# Patient Record
Sex: Male | Born: 1945
Health system: Southern US, Community
[De-identification: ages and names within clinical notes are randomized; demographics above are authoritative.]

## PROBLEM LIST (undated history)

## (undated) DIAGNOSIS — D126 Benign neoplasm of colon, unspecified: Secondary | ICD-10-CM

## (undated) DIAGNOSIS — R739 Hyperglycemia, unspecified: Secondary | ICD-10-CM

## (undated) DIAGNOSIS — H269 Unspecified cataract: Secondary | ICD-10-CM

## (undated) DIAGNOSIS — Z9989 Dependence on other enabling machines and devices: Secondary | ICD-10-CM

## (undated) DIAGNOSIS — C801 Malignant (primary) neoplasm, unspecified: Secondary | ICD-10-CM

## (undated) DIAGNOSIS — N400 Enlarged prostate without lower urinary tract symptoms: Secondary | ICD-10-CM

## (undated) DIAGNOSIS — G4730 Sleep apnea, unspecified: Secondary | ICD-10-CM

## (undated) DIAGNOSIS — G4733 Obstructive sleep apnea (adult) (pediatric): Secondary | ICD-10-CM

## (undated) DIAGNOSIS — E113499 Type 2 diabetes mellitus with severe nonproliferative diabetic retinopathy without macular edema, unspecified eye: Secondary | ICD-10-CM

## (undated) DIAGNOSIS — I10 Essential (primary) hypertension: Secondary | ICD-10-CM

## (undated) DIAGNOSIS — K5790 Diverticulosis of intestine, part unspecified, without perforation or abscess without bleeding: Secondary | ICD-10-CM

## (undated) DIAGNOSIS — E785 Hyperlipidemia, unspecified: Secondary | ICD-10-CM

## (undated) HISTORY — DX: Sleep apnea, unspecified: G47.30

## (undated) HISTORY — DX: Gilbert syndrome: E80.4

## (undated) HISTORY — DX: Benign neoplasm of colon, unspecified: D12.6

## (undated) HISTORY — PX: KNEE ARTHROSCOPY: SUR90

## (undated) HISTORY — PX: OTHER SURGICAL HISTORY: SHX169

## (undated) HISTORY — DX: Hyperlipidemia, unspecified: E78.5

## (undated) HISTORY — DX: Unspecified cataract: H26.9

## (undated) HISTORY — DX: Essential (primary) hypertension: I10

## (undated) HISTORY — DX: Dependence on other enabling machines and devices: Z99.89

## (undated) HISTORY — DX: Benign prostatic hyperplasia without lower urinary tract symptoms: N40.0

## (undated) HISTORY — DX: Malignant (primary) neoplasm, unspecified: C80.1

## (undated) HISTORY — PX: TONSILLECTOMY: SUR1361

## (undated) HISTORY — PX: COLONOSCOPY W/ POLYPECTOMY: SHX1380

## (undated) HISTORY — DX: Type 2 diabetes mellitus with severe nonproliferative diabetic retinopathy without macular edema, unspecified eye: E11.3499

## (undated) HISTORY — DX: Diverticulosis of intestine, part unspecified, without perforation or abscess without bleeding: K57.90

## (undated) HISTORY — DX: Obstructive sleep apnea (adult) (pediatric): G47.33

## (undated) HISTORY — DX: Hyperglycemia, unspecified: R73.9

## (undated) HISTORY — PX: WISDOM TOOTH EXTRACTION: SHX21

---

## 1998-09-06 ENCOUNTER — Encounter: Payer: Self-pay | Admitting: Internal Medicine

## 1998-09-06 ENCOUNTER — Ambulatory Visit (HOSPITAL_COMMUNITY): Admission: RE | Admit: 1998-09-06 | Discharge: 1998-09-06 | Payer: Self-pay | Admitting: Internal Medicine

## 2003-06-30 ENCOUNTER — Encounter: Payer: Self-pay | Admitting: Internal Medicine

## 2004-06-29 ENCOUNTER — Encounter (INDEPENDENT_AMBULATORY_CARE_PROVIDER_SITE_OTHER): Payer: Self-pay | Admitting: *Deleted

## 2004-06-29 ENCOUNTER — Ambulatory Visit: Payer: Self-pay | Admitting: Internal Medicine

## 2004-07-10 ENCOUNTER — Ambulatory Visit: Payer: Self-pay | Admitting: Pulmonary Disease

## 2004-07-10 ENCOUNTER — Ambulatory Visit (HOSPITAL_BASED_OUTPATIENT_CLINIC_OR_DEPARTMENT_OTHER): Admission: RE | Admit: 2004-07-10 | Discharge: 2004-07-10 | Payer: Self-pay | Admitting: Internal Medicine

## 2004-12-21 ENCOUNTER — Ambulatory Visit: Payer: Self-pay | Admitting: Internal Medicine

## 2005-06-22 ENCOUNTER — Ambulatory Visit: Payer: Self-pay | Admitting: Internal Medicine

## 2005-06-29 ENCOUNTER — Ambulatory Visit: Payer: Self-pay | Admitting: Internal Medicine

## 2005-12-31 ENCOUNTER — Ambulatory Visit: Payer: Self-pay | Admitting: Internal Medicine

## 2006-05-13 ENCOUNTER — Ambulatory Visit: Payer: Self-pay | Admitting: Internal Medicine

## 2006-05-13 LAB — CONVERTED CEMR LAB
ALT: 26 units/L (ref 0–40)
AST: 24 units/L (ref 0–37)
Chol/HDL Ratio, serum: 4
Cholesterol: 165 mg/dL (ref 0–200)
HDL: 41 mg/dL (ref >39.0)
Hgb A1c MFr Bld: 5.3 % (ref 4.6–6.0)
LDL Cholesterol: 88 mg/dL (ref 0–99)
Triglyceride fasting, serum: 179 mg/dL — ABNORMAL HIGH (ref 0–149)
VLDL: 36 mg/dL (ref 0–40)

## 2006-09-17 ENCOUNTER — Ambulatory Visit: Payer: Self-pay | Admitting: Internal Medicine

## 2006-09-17 ENCOUNTER — Encounter (INDEPENDENT_AMBULATORY_CARE_PROVIDER_SITE_OTHER): Payer: Self-pay | Admitting: *Deleted

## 2006-09-17 LAB — CONVERTED CEMR LAB
ALT: 27 units/L (ref 0–40)
AST: 21 units/L (ref 0–37)
Albumin: 4 g/dL (ref 3.5–5.2)
Alkaline Phosphatase: 29 units/L — ABNORMAL LOW (ref 39–117)
BUN: 13 mg/dL (ref 6–23)
Basophils Absolute: 0 10*3/uL (ref 0.0–0.1)
Basophils Relative: 0.3 % (ref 0.0–1.0)
Bilirubin, Direct: 0.1 mg/dL (ref 0.0–0.3)
CO2: 30 meq/L (ref 19–32)
Calcium: 9.5 mg/dL (ref 8.4–10.5)
Chloride: 106 meq/L (ref 96–112)
Cholesterol: 142 mg/dL (ref 0–200)
Creatinine, Ser: 0.9 mg/dL (ref 0.4–1.5)
Eosinophils Absolute: 0.1 10*3/uL (ref 0.0–0.6)
Eosinophils Relative: 2 % (ref 0.0–5.0)
GFR calc Af Amer: 111 mL/min
GFR calc non Af Amer: 92 mL/min
Glucose, Bld: 101 mg/dL — ABNORMAL HIGH (ref 70–99)
HCT: 45.4 % (ref 39.0–52.0)
HDL: 35 mg/dL — ABNORMAL LOW (ref >39.0)
Hemoglobin: 16 g/dL (ref 13.0–17.0)
Hgb A1c MFr Bld: 5.4 % (ref 4.6–6.0)
LDL Cholesterol: 79 mg/dL (ref 0–99)
Lymphocytes Relative: 24.6 % (ref 12.0–46.0)
MCHC: 35.3 g/dL (ref 30.0–36.0)
MCV: 95.3 fL (ref 78.0–100.0)
Monocytes Absolute: 0.5 10*3/uL (ref 0.2–0.7)
Monocytes Relative: 10.2 % (ref 3.0–11.0)
Neutro Abs: 3.2 10*3/uL (ref 1.4–7.7)
Neutrophils Relative %: 62.9 % (ref 43.0–77.0)
PSA: 0.8 ng/mL
PSA: 0.8 ng/mL (ref 0.10–4.00)
Platelets: 199 10*3/uL (ref 150–400)
Potassium: 4.2 meq/L (ref 3.5–5.1)
RBC: 4.76 M/uL (ref 4.22–5.81)
RDW: 11.4 % — ABNORMAL LOW (ref 11.5–14.6)
Sodium: 142 meq/L (ref 135–145)
TSH: 2.42 microintl units/mL (ref 0.35–5.50)
Total Bilirubin: 1.3 mg/dL — ABNORMAL HIGH (ref 0.3–1.2)
Total CHOL/HDL Ratio: 4.1
Total Protein: 7.2 g/dL (ref 6.0–8.3)
Triglycerides: 142 mg/dL (ref 0–149)
VLDL: 28 mg/dL (ref 0–40)
WBC: 5.1 10*3/uL (ref 4.5–10.5)

## 2006-09-26 ENCOUNTER — Ambulatory Visit: Payer: Self-pay | Admitting: Internal Medicine

## 2007-05-26 ENCOUNTER — Encounter (INDEPENDENT_AMBULATORY_CARE_PROVIDER_SITE_OTHER): Payer: Self-pay | Admitting: *Deleted

## 2007-05-26 DIAGNOSIS — Z85828 Personal history of other malignant neoplasm of skin: Secondary | ICD-10-CM | POA: Insufficient documentation

## 2007-05-26 DIAGNOSIS — K5730 Diverticulosis of large intestine without perforation or abscess without bleeding: Secondary | ICD-10-CM | POA: Insufficient documentation

## 2007-05-26 DIAGNOSIS — N400 Enlarged prostate without lower urinary tract symptoms: Secondary | ICD-10-CM | POA: Insufficient documentation

## 2007-05-27 ENCOUNTER — Telehealth (INDEPENDENT_AMBULATORY_CARE_PROVIDER_SITE_OTHER): Payer: Self-pay | Admitting: *Deleted

## 2007-08-28 ENCOUNTER — Encounter: Payer: Self-pay | Admitting: Internal Medicine

## 2007-10-31 ENCOUNTER — Telehealth: Payer: Self-pay | Admitting: Internal Medicine

## 2007-10-31 ENCOUNTER — Ambulatory Visit: Payer: Self-pay | Admitting: Internal Medicine

## 2007-10-31 ENCOUNTER — Encounter: Payer: Self-pay | Admitting: Internal Medicine

## 2007-10-31 IMAGING — CR DG CHEST 2V
2 series · 2 of 2 positions shown · non-contrast
Comparison: Chest radiograph [DATE]

CLINICAL DATA: Acute bronchitis cough times 1 week

CHEST - 2 VIEW

[view not recorded (1 of 2)]
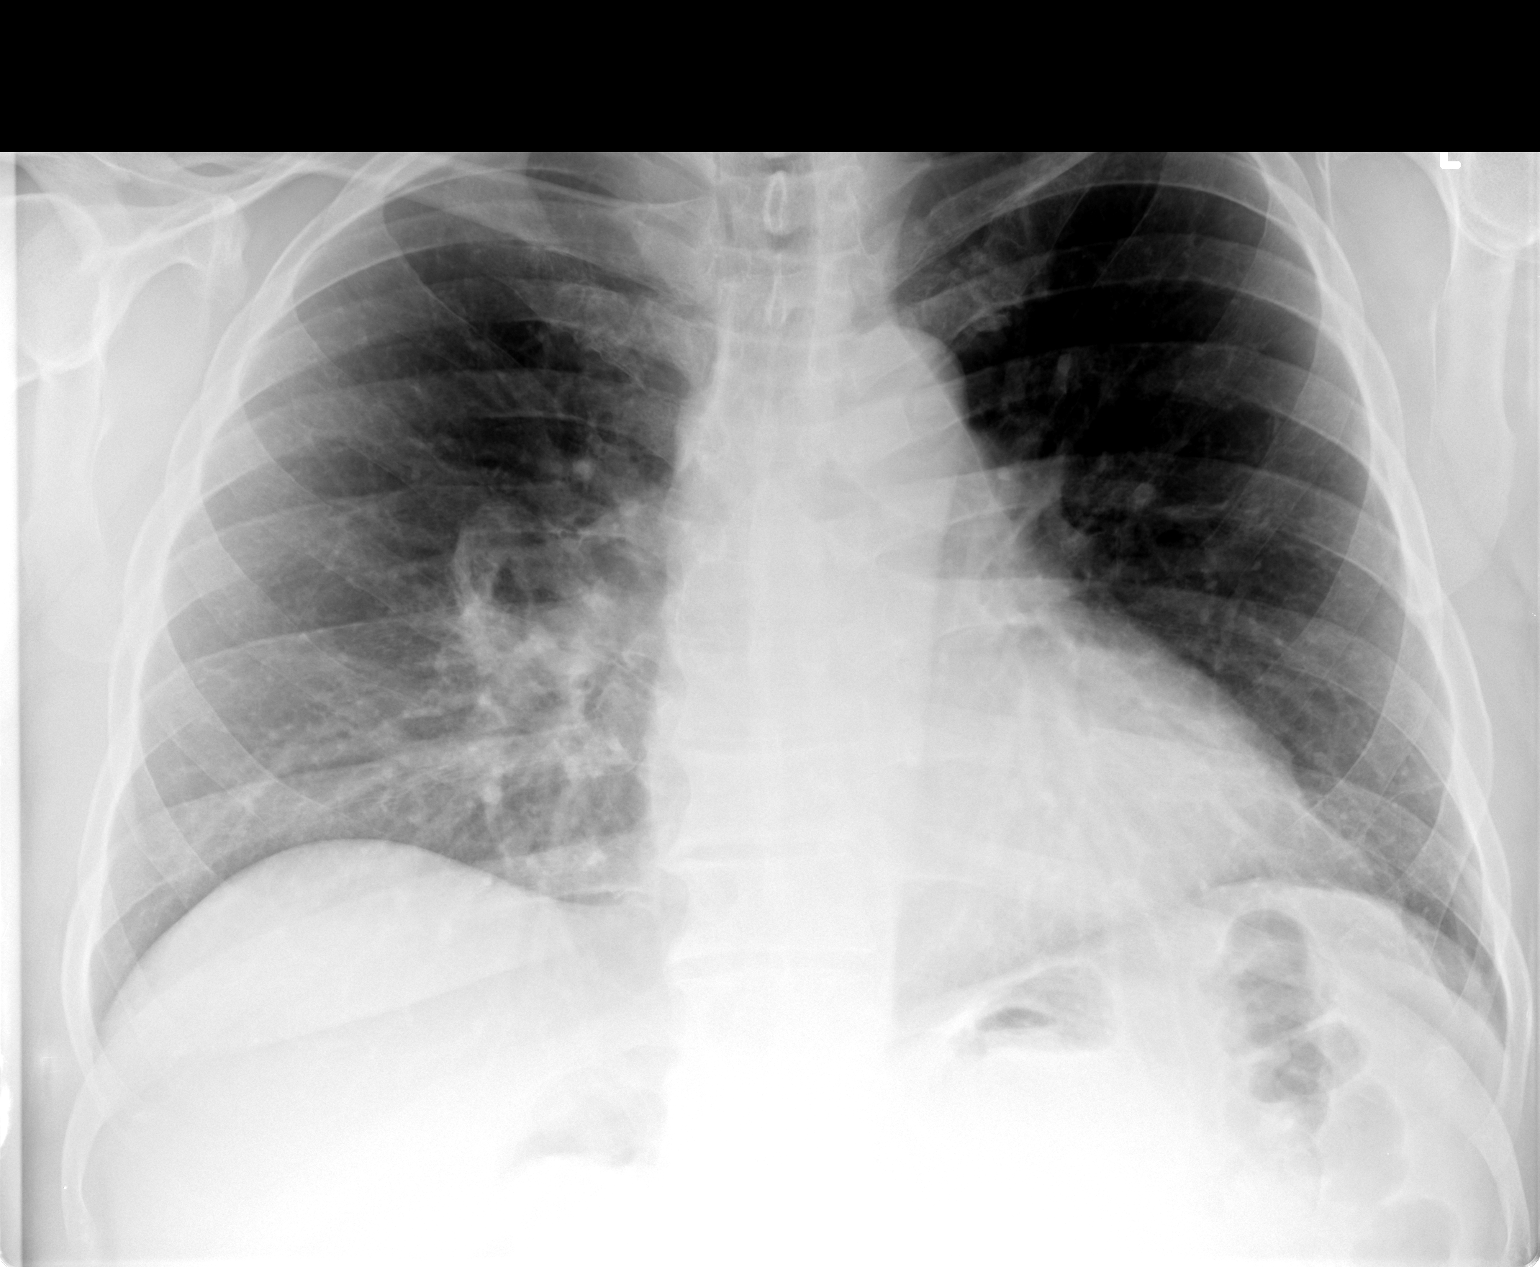

[view not recorded (2 of 2)]
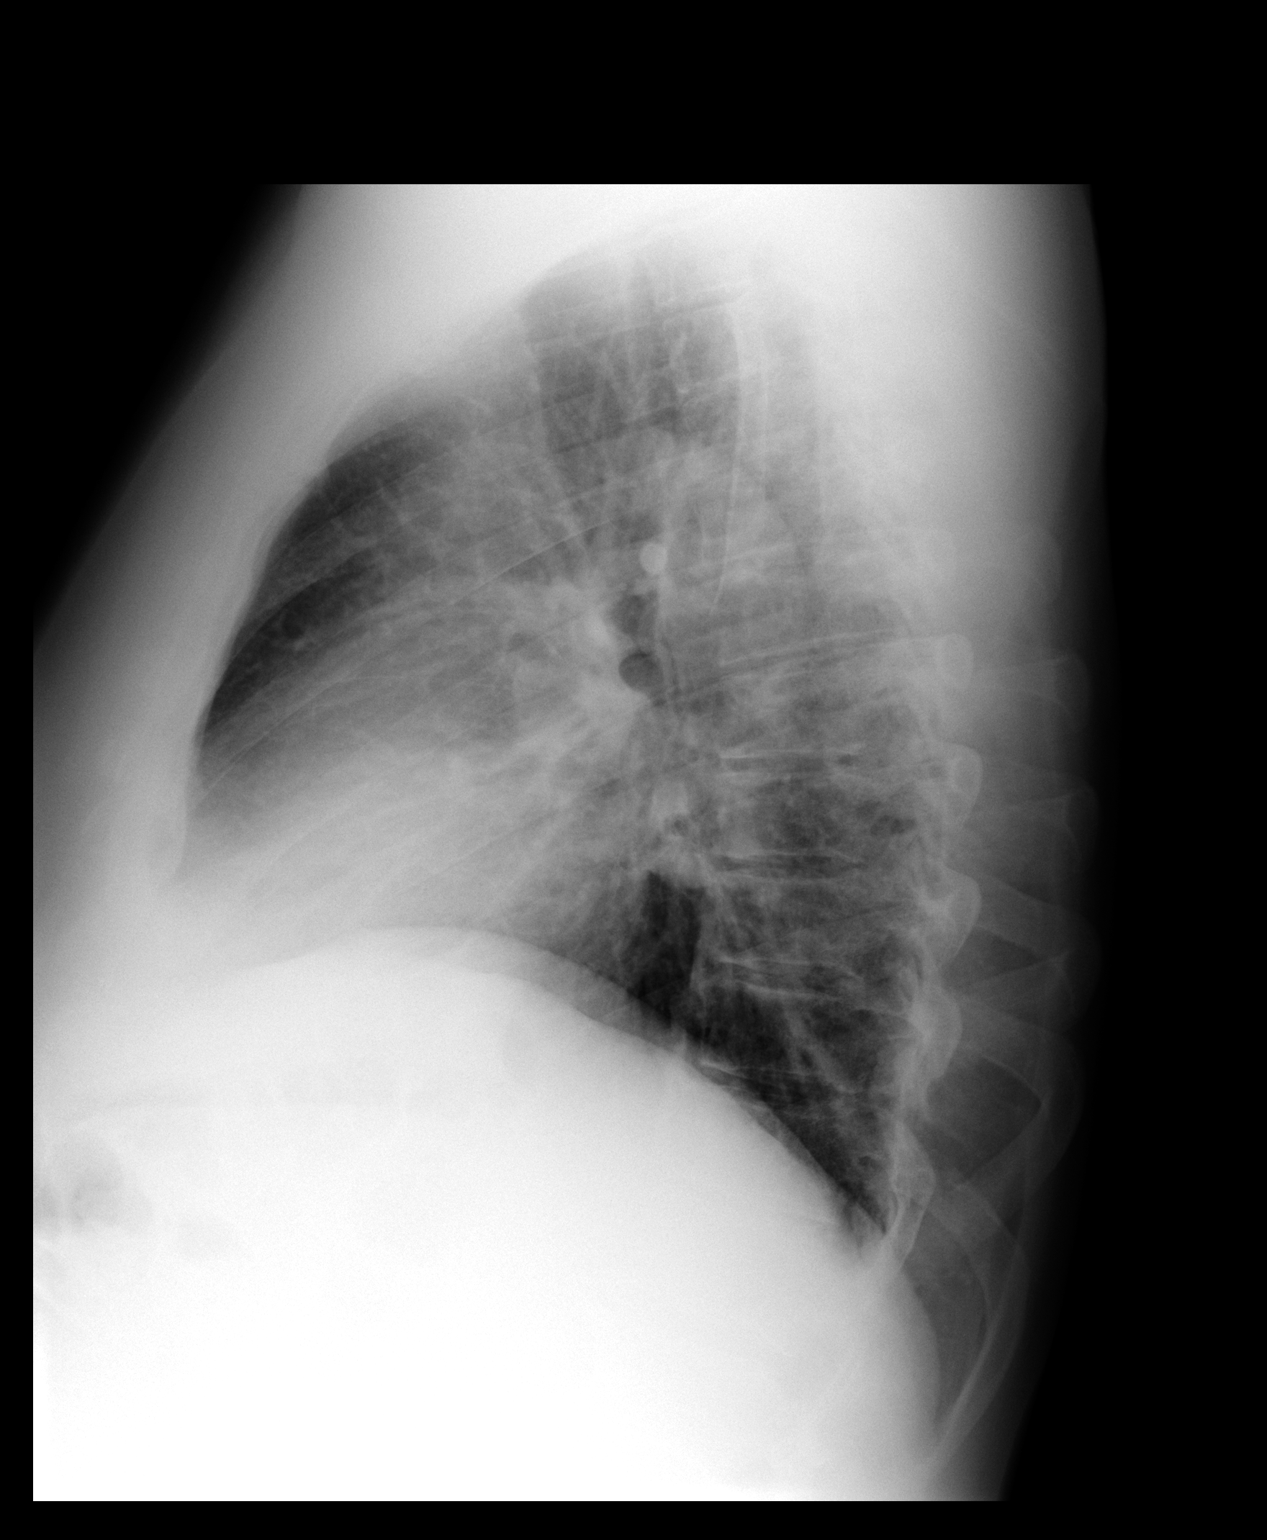

[2 of 2 positions shown; findings below may reference images not displayed]

FINDINGS: Stable mildly enlarged cardiac silhouette.  There is
coarse central bronchitic change similar to prior.   No pulmonary
edema.  No pneumothorax.  Opacity in the right cardiophrenic angle
could represent atelectasis versus early infiltrate.
IMPRESSION: 1. Opacity in right cardiophrenic angle represent atelectasis
versus early infiltrate.
2..  Chronic bronchitic change.

## 2007-12-15 ENCOUNTER — Ambulatory Visit: Payer: Self-pay | Admitting: Internal Medicine

## 2007-12-23 ENCOUNTER — Ambulatory Visit: Payer: Self-pay | Admitting: Internal Medicine

## 2008-01-19 ENCOUNTER — Encounter: Payer: Self-pay | Admitting: Internal Medicine

## 2008-01-19 DIAGNOSIS — D126 Benign neoplasm of colon, unspecified: Secondary | ICD-10-CM

## 2008-01-19 HISTORY — DX: Benign neoplasm of colon, unspecified: D12.6

## 2008-01-25 ENCOUNTER — Encounter: Payer: Self-pay | Admitting: Internal Medicine

## 2008-02-19 ENCOUNTER — Telehealth (INDEPENDENT_AMBULATORY_CARE_PROVIDER_SITE_OTHER): Payer: Self-pay | Admitting: *Deleted

## 2008-02-26 ENCOUNTER — Ambulatory Visit: Payer: Self-pay | Admitting: Internal Medicine

## 2008-02-26 LAB — CONVERTED CEMR LAB
Albumin: 4 g/dL (ref 3.5–5.2)
Alkaline Phosphatase: 32 units/L — ABNORMAL LOW (ref 39–117)
Basophils Absolute: 0 10*3/uL (ref 0.0–0.1)
Bilirubin, Direct: 0.1 mg/dL (ref 0.0–0.3)
Blood in Urine, dipstick: NEGATIVE
Chloride: 107 meq/L (ref 96–112)
Eosinophils Absolute: 0.1 10*3/uL (ref 0.0–0.7)
GFR calc Af Amer: 111 mL/min
GFR calc non Af Amer: 91 mL/min
Glucose, Urine, Semiquant: NEGATIVE
HCT: 44 % (ref 39.0–52.0)
HDL: 36.7 mg/dL — ABNORMAL LOW (ref >39.0)
Ketones, urine, test strip: NEGATIVE
MCV: 97.3 fL (ref 78.0–100.0)
Monocytes Absolute: 0.7 10*3/uL (ref 0.1–1.0)
Neutrophils Relative %: 57.6 % (ref 43.0–77.0)
Nitrite: NEGATIVE
Platelets: 184 10*3/uL (ref 150–400)
Potassium: 3.8 meq/L (ref 3.5–5.1)
Protein, U semiquant: NEGATIVE
RDW: 11.8 % (ref 11.5–14.6)
Sodium: 142 meq/L (ref 135–145)
Specific Gravity, Urine: 1.01
TSH: 3.07 microintl units/mL (ref 0.35–5.50)
Total Bilirubin: 1.2 mg/dL (ref 0.3–1.2)
Triglycerides: 158 mg/dL — ABNORMAL HIGH (ref 0–149)
VLDL: 32 mg/dL (ref 0–40)
WBC Urine, dipstick: NEGATIVE
pH: 5

## 2008-03-02 ENCOUNTER — Encounter: Payer: Self-pay | Admitting: Internal Medicine

## 2008-03-03 ENCOUNTER — Ambulatory Visit: Payer: Self-pay | Admitting: Internal Medicine

## 2008-03-03 DIAGNOSIS — I10 Essential (primary) hypertension: Secondary | ICD-10-CM

## 2008-03-03 DIAGNOSIS — E785 Hyperlipidemia, unspecified: Secondary | ICD-10-CM

## 2008-03-03 DIAGNOSIS — Z86010 Personal history of colonic polyps: Secondary | ICD-10-CM

## 2008-03-03 DIAGNOSIS — N400 Enlarged prostate without lower urinary tract symptoms: Secondary | ICD-10-CM

## 2008-03-03 DIAGNOSIS — Z85828 Personal history of other malignant neoplasm of skin: Secondary | ICD-10-CM

## 2008-03-03 DIAGNOSIS — M7580 Other shoulder lesions, unspecified shoulder: Secondary | ICD-10-CM

## 2008-06-11 ENCOUNTER — Telehealth (INDEPENDENT_AMBULATORY_CARE_PROVIDER_SITE_OTHER): Payer: Self-pay | Admitting: *Deleted

## 2009-03-16 ENCOUNTER — Encounter (INDEPENDENT_AMBULATORY_CARE_PROVIDER_SITE_OTHER): Payer: Self-pay | Admitting: *Deleted

## 2009-03-23 ENCOUNTER — Ambulatory Visit: Payer: Self-pay | Admitting: Internal Medicine

## 2009-03-23 DIAGNOSIS — J309 Allergic rhinitis, unspecified: Secondary | ICD-10-CM | POA: Insufficient documentation

## 2009-11-24 ENCOUNTER — Telehealth (INDEPENDENT_AMBULATORY_CARE_PROVIDER_SITE_OTHER): Payer: Self-pay | Admitting: *Deleted

## 2010-05-11 ENCOUNTER — Ambulatory Visit
Admission: RE | Admit: 2010-05-11 | Discharge: 2010-05-11 | Payer: Self-pay | Source: Home / Self Care | Attending: Internal Medicine | Admitting: Internal Medicine

## 2010-05-11 ENCOUNTER — Encounter: Payer: Self-pay | Admitting: Internal Medicine

## 2010-05-11 ENCOUNTER — Other Ambulatory Visit: Payer: Self-pay | Admitting: Internal Medicine

## 2010-05-11 DIAGNOSIS — M545 Low back pain: Secondary | ICD-10-CM | POA: Insufficient documentation

## 2010-05-11 DIAGNOSIS — M542 Cervicalgia: Secondary | ICD-10-CM | POA: Insufficient documentation

## 2010-05-11 DIAGNOSIS — R739 Hyperglycemia, unspecified: Secondary | ICD-10-CM | POA: Insufficient documentation

## 2010-05-11 LAB — CBC WITH DIFFERENTIAL/PLATELET
Basophils Absolute: 0 10*3/uL (ref 0.0–0.1)
Basophils Relative: 0.8 % (ref 0.0–3.0)
Eosinophils Absolute: 0.1 10*3/uL (ref 0.0–0.7)
Eosinophils Relative: 2.8 % (ref 0.0–5.0)
HCT: 44.3 % (ref 39.0–52.0)
Hemoglobin: 15.6 g/dL (ref 13.0–17.0)
Lymphocytes Relative: 27.6 % (ref 12.0–46.0)
Lymphs Abs: 1.4 10*3/uL (ref 0.7–4.0)
MCHC: 35.1 g/dL (ref 30.0–36.0)
MCV: 97.5 fl (ref 78.0–100.0)
Monocytes Absolute: 0.5 10*3/uL (ref 0.1–1.0)
Monocytes Relative: 10.6 % (ref 3.0–12.0)
Neutro Abs: 3 10*3/uL (ref 1.4–7.7)
Neutrophils Relative %: 58.2 % (ref 43.0–77.0)
Platelets: 182 10*3/uL (ref 150.0–400.0)
RBC: 4.55 Mil/uL (ref 4.22–5.81)
RDW: 12.6 % (ref 11.5–14.6)
WBC: 5.1 10*3/uL (ref 4.5–10.5)

## 2010-05-11 LAB — LIPID PANEL
Cholesterol: 157 mg/dL (ref 0–200)
HDL: 40.3 mg/dL (ref >39.00)
LDL Cholesterol: 84 mg/dL (ref 0–99)
Total CHOL/HDL Ratio: 4
Triglycerides: 162 mg/dL — ABNORMAL HIGH (ref 0.0–149.0)
VLDL: 32.4 mg/dL (ref 0.0–40.0)

## 2010-05-11 LAB — BASIC METABOLIC PANEL
BUN: 19 mg/dL (ref 6–23)
CO2: 25 mEq/L (ref 19–32)
Calcium: 9.1 mg/dL (ref 8.4–10.5)
Chloride: 106 mEq/L (ref 96–112)
Creatinine, Ser: 1.1 mg/dL (ref 0.4–1.5)
GFR: 69.09 mL/min (ref >60.00)
Glucose, Bld: 100 mg/dL — ABNORMAL HIGH (ref 70–99)
Potassium: 4.2 mEq/L (ref 3.5–5.1)
Sodium: 141 mEq/L (ref 135–145)

## 2010-05-11 LAB — HEPATIC FUNCTION PANEL
ALT: 25 U/L (ref 0–53)
AST: 23 U/L (ref 0–37)
Albumin: 4 g/dL (ref 3.5–5.2)
Alkaline Phosphatase: 34 U/L — ABNORMAL LOW (ref 39–117)
Bilirubin, Direct: 0.1 mg/dL (ref 0.0–0.3)
Total Bilirubin: 1 mg/dL (ref 0.3–1.2)
Total Protein: 7 g/dL (ref 6.0–8.3)

## 2010-05-11 LAB — TSH: TSH: 1.81 u[IU]/mL (ref 0.35–5.50)

## 2010-05-11 LAB — PSA: PSA: 0.88 ng/mL (ref 0.10–4.00)

## 2010-05-11 LAB — HEMOGLOBIN A1C: Hgb A1c MFr Bld: 5.5 % (ref 4.6–6.5)

## 2010-06-04 LAB — CONVERTED CEMR LAB
BUN: 11 mg/dL (ref 6–23)
Basophils Absolute: 0 10*3/uL (ref 0.0–0.1)
Cholesterol: 120 mg/dL (ref 0–200)
Creatinine, Ser: 1 mg/dL (ref 0.4–1.5)
GFR calc non Af Amer: 80.67 mL/min (ref >60)
Glucose, Bld: 105 mg/dL — ABNORMAL HIGH (ref 70–99)
HCT: 44.7 % (ref 39.0–52.0)
Lymphs Abs: 1.2 10*3/uL (ref 0.7–4.0)
MCV: 100.7 fL — ABNORMAL HIGH (ref 78.0–100.0)
Monocytes Absolute: 0.6 10*3/uL (ref 0.1–1.0)
Monocytes Relative: 11.9 % (ref 3.0–12.0)
PSA: 0.84 ng/mL (ref 0.10–4.00)
Platelets: 222 10*3/uL (ref 150.0–400.0)
Potassium: 4 meq/L (ref 3.5–5.1)
RDW: 11.8 % (ref 11.5–14.6)
TSH: 1.74 microintl units/mL (ref 0.35–5.50)
Total Bilirubin: 0.7 mg/dL (ref 0.3–1.2)
Triglycerides: 125 mg/dL (ref 0.0–149.0)
VLDL: 25 mg/dL (ref 0.0–40.0)

## 2010-06-06 NOTE — Progress Notes (Signed)
Summary: crestor refill   Phone Note Refill Request Call back at [phone] Message from:  Pharmacy on November 24, 2009 10:00 AM  Refills Requested: Medication #1:  CRESTOR 20 MG  TABS take 1 tablet by mouth every other day   Dosage confirmed as above?Dosage Confirmed   Supply Requested: 1 month   Last Refilled: 08/01/2009   Dosage confirmed as above?Dosage Confirmed [facility]   Initial call taken by: Lavell Islam,  November 24, 2009 10:01 AM    Prescriptions: CRESTOR 20 MG  TABS (ROSUVASTATIN CALCIUM) take 1 tablet by mouth every other day  #30 Each x 5   Entered by:   Doristine Devoid CMA   Authorized by:   Marga Melnick MD   Signed by:   Doristine Devoid CMA on 11/24/2009   Method used:   Electronically to        [facility]* (retail)       [address]       [place], [place]  [id number]       Ph: [phone]       Fax: [phone]   RxID:   [id number]

## 2010-06-08 NOTE — Assessment & Plan Note (Signed)
Summary: cpx & lab/cbs   Vital Signs:  Patient profile:   65 year old male Height:      68.25 inches Weight:      230.8 pounds BMI:     34.96 Temp:     98.6 degrees F oral Pulse rate:   72 / minute Resp:     14 per minute BP sitting:   126 / 84  (left arm) Cuff size:   large  Vitals Entered By: Shonna Chock CMA (May 11, 2010 8:31 AM) CC: CPX with fasting labs , Back pain, Neck pain, Lipid Management   CC:  CPX with fasting labs , Back pain, Neck pain, and Lipid Management.  History of Present Illness:    Edwin [name] is here for a physical; he has had some new symptoms as delineated below.  He describes Back Pain intermittently X 6 months w/o trigger or injury.  The pain is located in the mid low back.  The  aching pain began gradually.  The pain  does not radiate.  The pain  has  no  exacerbating factors, even tennis.The pain is made  slightly better by NSAID medications( Aleve or Advil). He is pain free today.        The patient also presents with daily  Neck pain.  The patient reports  diffuse occipital &  neck pain when he turns his head to back out of driveway.Associated symptoms include slight  impaired neck ROM.  The patient denies the following associated symptoms: numbness, weakness, tingling/parasthesias, bladder dysfunction, bowel dysfunction, locking, and clicking.  The pain is described as dull and intermittent, only with lateral neck rotation in either direction.Pain essentially resolves with straightening head.                                                                                                                           The patient also presents for Hyperlipidemia follow-up.  The patient denies muscle aches, GI upset, abdominal pain, flushing, itching, constipation, diarrhea, and fatigue.  The patient denies the following symptoms: chest pain/pressure, exercise intolerance, dypsnea, palpitations, syncope, and pedal edema.  Compliance with medications (by patient  report) has been near 100%.  Dietary compliance has been good.  The patient reports exercising occasionally.  Adjunctive measures currently used by the patient include fiber, ASA, and fish oil supplements.        The patient also presents for Hypertension follow-up.  The patient reports urinary frequency, but denies lightheadedness and headaches.  Compliance with medications (by patient report) has been near 100%.  Adjunctive measures currently used by the patient include salt restriction. BP not monitored.   Lipid Management History:      Positive NCEP/ATP III risk factors include male age 37 years old or older, HDL cholesterol less than 40, family history for ischemic heart disease (males less than 36 years old), and hypertension.  Negative NCEP/ATP III risk factors include non-diabetic, non-tobacco-user status, no ASHD (atherosclerotic heart  disease), no prior stroke/TIA, no peripheral vascular disease, and no history of aortic aneurysm.     Current Medications (verified): 1)  Baby Aspirin 81 Mg  Chew (Aspirin) .... Take 1 Tablet By Mouth Daily As Directed 2)  Multivitamins   Tabs (Multiple Vitamin) .... Take 1 Tablet By Mouth Daily 3)  Saw Palmetto .... As Directed 4)  Omega 3 .... As Directed 5)  Metoprolol Succinate 50 Mg  Tb24 (Metoprolol Succinate) .... Take 1 Qd 6)  Crestor 20 Mg  Tabs (Rosuvastatin Calcium) .... Take 1 Tablet By Mouth Every Other Day 7)  Vitamin D 1000 Unit Tabs (Cholecalciferol) .[name].. 1 By Mouth Once Daily  Allergies (verified): No Known Drug Allergies  Past History:  Past Medical History: Diverticulosis, colon; Hyperglycemia (790.29); Gilbert's Syndrome; Skin cancer, PMH  of, Basal Cell of face, Dr  Karlyn Agee Colonic polyps, PMH  of 2009 Hyperlipidemia: NMR Lipoprofile 2005: LDL 146(2007/1330),HDL 38, TG 169. LDL goal = < 100; Framingham Study LDL goal = < 130 Benign prostatic hypertrophy Sleep Apnea on CPAP  Past Surgical  History: Tonsillectomy Arthroscopy  left knee, Dr  Hayden Rasmussen Colonoscopy negative 2004 Colon polypectomy, Tics  2009 Kathryne Sharper)  Family History: Father: DM (type II), HTN, CBAG  @ 65 ,died post fall (SDH) Mother: Diverticulosis, ? colon polyps, CVA (2005), Parkinson's Siblings: sister: HTN,  ruptured appendix,                brother: died age 46  lymphoma, ruptured appendix, asthma as child MGF:  MI early 108's, lived to 61-90, Parkinson's MGM:  CVA (18), ? MI in 44s  Social History: Occupation: Gaffer Married Never Smoked Alcohol use-yes: socially [employer]   Review of Systems  The patient denies anorexia, fever, vision loss, decreased hearing, hoarseness, prolonged cough, hemoptysis, melena, hematochezia, hematuria, suspicious skin lesions, depression, unusual weight change, abnormal bleeding, enlarged lymph nodes, and angioedema.    Physical Exam  General:  well-nourished;alert,appropriate and cooperative throughout examination Head:  Normocephalic and atraumatic without obvious abnormalities. No apparent alopecia Eyes:  No corneal or conjunctival inflammation noted. Perrla. Funduscopic exam benign, without hemorrhages, exudates or papilledema. Ears:  External ear exam shows no significant lesions or deformities.  Otoscopic examination reveals  some wax , R > L. Hearing is grossly normal bilaterally. Nose:  External nasal examination shows no deformity or inflammation. Nasal mucosa are pink and moist without lesions or exudates. Mouth:  Oral mucosa and oropharynx without lesions or exudates.  Teeth in good repair. Neck:  No deformities, masses, or tenderness noted. Some ROM limitations  Lungs:  Normal respiratory effort, chest expands symmetrically. Lungs are clear to auscultation, no crackles or wheezes. Heart:  Normal rate and regular rhythm. S1 and S2 normal without gallop, murmur, click, rub or other extra sounds. Abdomen:  Bowel sounds  positive,abdomen soft and non-tender without masses, organomegaly or hernias noted. Rectal:  No external abnormalities noted. Normal sphincter tone. No rectal masses or tenderness. Genitalia:  Testes bilaterally descended without nodularity, tenderness or masses. No scrotal masses or lesions. No penis lesions or urethral discharge. L varicocele.   Prostate:  Prostate gland firm and smooth,  ULN enlargement w/o nodularity, tenderness, mass, asymmetry or induration. Msk:  No deformity or scoliosis noted of thoracic or lumbar spine.   Pulses:  R and L carotid,radial,dorsalis pedis and posterior tibial pulses are full and equal bilaterally Extremities:  No clubbing, cyanosis, edema, or deformity noted with normal full range of motion of all joints.  Neurologic:  alert & oriented X3, strength normal in all extremities, gait normal, and DTRs symmetrical and normal.   Skin:  Large keratosis R forehead Cervical Nodes:  No lymphadenopathy noted Axillary Nodes:  No palpable lymphadenopathy Psych:  memory intact for recent and remote, normally interactive, and good eye contact.     Impression & Recommendations:  Problem # 1:  ROUTINE GENERAL MEDICAL EXAM@HEALTH  CARE FACL (ICD-V70.0)  Orders: EKG w/ Interpretation (93000) Venipuncture (56213) TLB-Lipid Panel (80061-LIPID) TLB-BMP (Basic Metabolic Panel-BMET) (80048-METABOL) TLB-CBC Platelet - w/Differential (85025-CBCD) TLB-Hepatic/Liver Function Pnl (80076-HEPATIC) TLB-TSH (Thyroid Stimulating Hormone) (84443-TSH) TLB-PSA (Prostate Specific Antigen) (84153-PSA) TLB-A1C / Hgb A1C (Glycohemoglobin) (83036-A1C)  Problem # 2:  LOW BACK PAIN SYNDROME (ICD-724.2) exercises (stretching) His updated medication list for this problem includes:    Baby Aspirin 81 Mg Chew (Aspirin) .[name]... Take 1 tablet by mouth daily as directed  Problem # 3:  NECK PAIN (ICD-723.1)  His updated medication list for this problem includes:    Baby Aspirin 81 Mg Chew  (Aspirin) .[name]... Take 1 tablet by mouth daily as directed  Problem # 4:  HYPERTENSION, ESSENTIAL NOS (ICD-401.9) controlled His updated medication list for this problem includes:    Metoprolol Succinate 50 Mg Tb24 (Metoprolol succinate) .[name]... Take 1 qd  Problem # 5:  HYPERLIPIDEMIA (ICD-272.4)  His updated medication list for this problem includes:    Crestor 20 Mg Tabs (Rosuvastatin calcium) .[name]... Take 1/2  tablet by mouth every other day  Problem # 6:  DIABETES MELLITUS, FAMILY HX (ICD-V18.0)  Problem # 7:  HYPERGLYCEMIA, FASTING (ICD-790.29)  Problem # 8:  BENIGN PROSTATIC HYPERTROPHY (ICD-600.00)  Complete Medication List: 1)  Baby Aspirin 81 Mg Chew (Aspirin) .... Take 1 tablet by mouth daily as directed 2)  Multivitamins Tabs (Multiple vitamin) .... Take 1 tablet by mouth daily 3)  Saw Palmetto  .... As directed 4)  Omega 3  .... As directed 5)  Metoprolol Succinate 50 Mg Tb24 (Metoprolol succinate) .... Take 1 qd 6)  Crestor 20 Mg Tabs (Rosuvastatin calcium) .... Take 1 tablet by mouth every other day 7)  Vitamin D 1000 Unit Tabs (Cholecalciferol) .[name].. 1 by mouth once daily  Other Orders: Tdap => 46yrs IM (08657) Admin 1st Vaccine (84696)  Lipid Assessment/Plan:      Based on NCEP/ATP III, the patient's risk factor category is "2 or more risk factors and a calculated 10 year CAD risk of < 20%".  The patient's lipid goals are as follows: Total cholesterol goal is 200; LDL cholesterol goal is 130; HDL cholesterol goal is 40; Triglyceride goal is 150.    Patient Instructions: 1)  It is important that you exercise regularly at least 20 minutes 5 times a week. If you develop chest pain, have severe difficulty breathing, or feel very tired , stop exercising immediately and seek medical attention.Take an Aspirin every day. Physical Therapy or Chiropractry if neck symptoms progress. 2)  Check your Blood Pressure regularly. If it is above: 135/85 ON AVERAGE  you should make an  appointment.   Orders Added: 1)  Tdap => 5yrs IM [90715] 2)  Admin 1st Vaccine [90471] 3)  Est. Patient 40-64 years [99396] 4)  EKG w/ Interpretation [93000] 5)  Venipuncture [36415] 6)  TLB-Lipid Panel [80061-LIPID] 7)  TLB-BMP (Basic Metabolic Panel-BMET) [80048-METABOL] 8)  TLB-CBC Platelet - w/Differential [85025-CBCD] 9)  TLB-Hepatic/Liver Function Pnl [80076-HEPATIC] 10)  TLB-TSH (Thyroid Stimulating Hormone) [84443-TSH] 11)  TLB-PSA (Prostate Specific Antigen) [84153-PSA] 12)  TLB-A1C / Hgb  A1C (Glycohemoglobin) [83036-A1C]   Immunizations Administered:  Tetanus Vaccine:    Vaccine Type: Tdap    Site: right deltoid    Mfr: GlaxoSmithKline    Dose: 0.5 ml    Route: IM    Given by: Shonna Chock CMA    Exp. Date: 02/24/2012    Lot #: [id number]    VIS given: 03/24/08 version given May 11, 2010.   Immunizations Administered:  Tetanus Vaccine:    Vaccine Type: Tdap    Site: right deltoid    Mfr: GlaxoSmithKline    Dose: 0.5 ml    Route: IM    Given by: Shonna Chock CMA    Exp. Date: 02/24/2012    Lot #: [id number]    VIS given: 03/24/08 version given May 11, 2010.    Appended Document: cpx & lab/cbs

## 2010-09-01 ENCOUNTER — Other Ambulatory Visit: Payer: Self-pay | Admitting: *Deleted

## 2010-09-01 MED ORDER — METOPROLOL SUCCINATE ER 50 MG PO TB24: 50.0000 mg | ORAL_TABLET | Freq: Every day | ORAL | Status: DC

## 2010-09-19 NOTE — Assessment & Plan Note (Signed)
[facility]                        [place] [facility] OFFICE NOTE   NAME:Edwin [name], Edwin [name]                      MRN:          [id number]  DATE:09/26/2006                            DOB:          [birth year]    Edwin [name]) was seen for a comprehensive exam on Sep 26, 2006.  Active issues right now include some benign positional vertigo  which lasts 1-2 minutes when he reclines at night.  He has had only  minor symptoms with ambulation.  He denies any true vertigo.   There is no associated rhinoconjunctivitis symptoms, although he has had  some extrinsic rhinitis symptoms recently for which he has taken  loratadine; these include sneezing and nasal congestion, without  purulence, fever or facial pain.   Additionally, he will have watery eyes.   PAST MEDICAL HISTORY:  1. Arthroscopy of the left knee.  2. Tonsillectomy.  3. Negative colonoscopy in 2004.  4. A basal cell cancer was removed from his face.  5. He has had dyslipidemia and is on medication.  6. He has also had mild prostatic hypertrophy.  7. Diverticulosis had been suggested on barium enema.   His mother had diverticulosis, questionable colon polyp, stroke,  Parkinson's.  A brother had lymphoma and a ruptured appendix as a young  man.  His father had diabetes, hypertension, bypass surgery and a  subdural following head trauma.  Sister had hypertension and also  ruptured appendix.  Maternal grandfather had myocardial infarctions and  Parkinson's.  Maternal grandmother had stroke and heart attack.   He has never smoked; he drinks socially.  He will exercise 3-4 times a  week with no cardiopulmonary symptoms.  He is on no specific diet.   MEDICATIONS:  Presently, he is on:  1. Baby aspirin.  2. Multivitamins.  3. Saw palmetto extract.  4. Metoprolol 50 mg one-half daily.  5. Crestor 20 mg every other day.   ALLERGIES:  He has no known drug allergies.   REVIEW OF  SYSTEMS:  Also reveals some myalgias and arthralgias which  lasted approximately a week w/o persitance; he questioned a relationship  to Crestor.   He will also have some carpal tunnel symptoms in his right hand when he  plays tennis or racquetball.   The remainder of the review of systems is negative.   PHYSICAL EXAMINATION:  His weight is stable at 230.  Pulse is 68,  respiratory rate 14 and blood pressure 128/68.  There is minimal arteriolar narrowing.  Nares are patent.  There is some  increased cerumen in the otic canals.  Dental hygiene is good.  Thyroid is normal to palpation.  He has no lymphadenopathy about the head, neck or axillae.  CHEST:  Clear with no increased work of breathing or wheezing.  He has an S4 with slurring.  He has no organomegaly or masses, or abdominal tenderness.  A varicocele was present on the left.  Prostate upper limits of normal with no nodularity.  He has mild crepitus of the knees.  He has a large ganglion of the left  ventral wrist.  Romberg testing is negative and neuropsychiatric evaluation is  unremarkable except for equivocally positive Tinel's sign of the right  wrist.   His EKG is normal.   His glucose was mildly elevated at 101, but his A1c is still non-  diabetic at 5.4.  His lipids are at goal with the exception of mildly  reduced HDL of 35.   No change would be recommended except to visit the Prevention.com web  site, the [employer] Diet.   If the carpal tunnel symptoms are bothersome, then a wrist splint could  be used when playing tennis or racquetball.  If the ganglion becomes  problematic, referral to a hand surgeon will be recommended.   Generic loratadine will be prescribed and Neti pot recommended for nasal  sx.A wedge pillow may eliminate the benign positional vertigo sx.   He has had mild terminal minimal urinary incontinence;trial of Flomax  0.4 daily will be recommended.     Titus Dubin. Alwyn Ren, MD,FACP,FCCP   Electronically Signed    [facility]/Med[facility]  DD: 09/26/2006  DT: 09/26/2006  Job #: [id number]

## 2010-09-22 NOTE — Procedures (Signed)
NAME:  Edwin [name], Edwin [name] NO.:  [id number]   MEDICAL RECORD NO.:  [id number]          PATIENT TYPE:  OUT   LOCATION:  SLEEP CENTER                 FACILITY:  [facility]   PHYSICIAN:  Marcelyn Bruins, M.D. [facility] DATE OF BIRTH:  05/17/1945   DATE OF STUDY:  07/10/2004                              NOCTURNAL POLYSOMNOGRAM   REFERRING PHYSICIAN:  Dr. Lona Kettle   DATE OF STUDY:  July 10, 2004   INDICATION FOR THE STUDY:  Hypersomnia with sleep apnea. Epworth score is  10.   SLEEP ARCHITECTURE:  The patient had a total sleep time of 340 minutes with  a sleep efficiency of only 76%. There is very little slow wave sleep and  REM. Sleep onset latency was very prolonged at 81 minutes and REM onset was  normal.   IMPRESSION:  1.  Moderate obstructive sleep apnea/hypopnea syndrome with a respiratory      disturbance index of 24 events per hour and O2 desaturation as low as      86%. Events were not positional.  2.  Severe snoring noted throughout the study.  3.  No clinically significant cardiac arrhythmias.  4.  Moderate numbers of leg jerks with mild sleep disruption. Clinical      correlation is suggested if the patient fails to respond appropriately      to treatment of his obstructive apnea.      [facility]/MEDQ  D:  07/17/2004 17:20:57  T:  07/17/2004 22:04:18  Job:  [id number]

## 2010-12-22 ENCOUNTER — Ambulatory Visit (INDEPENDENT_AMBULATORY_CARE_PROVIDER_SITE_OTHER)
Admission: RE | Admit: 2010-12-22 | Discharge: 2010-12-22 | Disposition: A | Discharge status: Home / Self Care | Payer: BC Managed Care – PPO | Source: Ambulatory Visit | Attending: Internal Medicine | Admitting: Internal Medicine

## 2010-12-22 ENCOUNTER — Encounter: Payer: Self-pay | Admitting: Internal Medicine

## 2010-12-22 ENCOUNTER — Ambulatory Visit (INDEPENDENT_AMBULATORY_CARE_PROVIDER_SITE_OTHER): Payer: BC Managed Care – PPO | Admitting: Internal Medicine

## 2010-12-22 DIAGNOSIS — M545 Low back pain, unspecified: Secondary | ICD-10-CM

## 2010-12-22 DIAGNOSIS — IMO0001 Reserved for inherently not codable concepts without codable children: Secondary | ICD-10-CM

## 2010-12-22 DIAGNOSIS — M7918 Myalgia, other site: Secondary | ICD-10-CM

## 2010-12-22 IMAGING — CR DG LUMBAR SPINE BEND(FLEX/EXT) ONLY 2-3 V
2 series · 2 of 2 positions shown · non-contrast
Comparison: None

CLINICAL DATA: Low back pain

LUMBAR SPINE FLEX AND EXTEND ONLY - 2-3 VIEW

[view not recorded (1 of 2)]
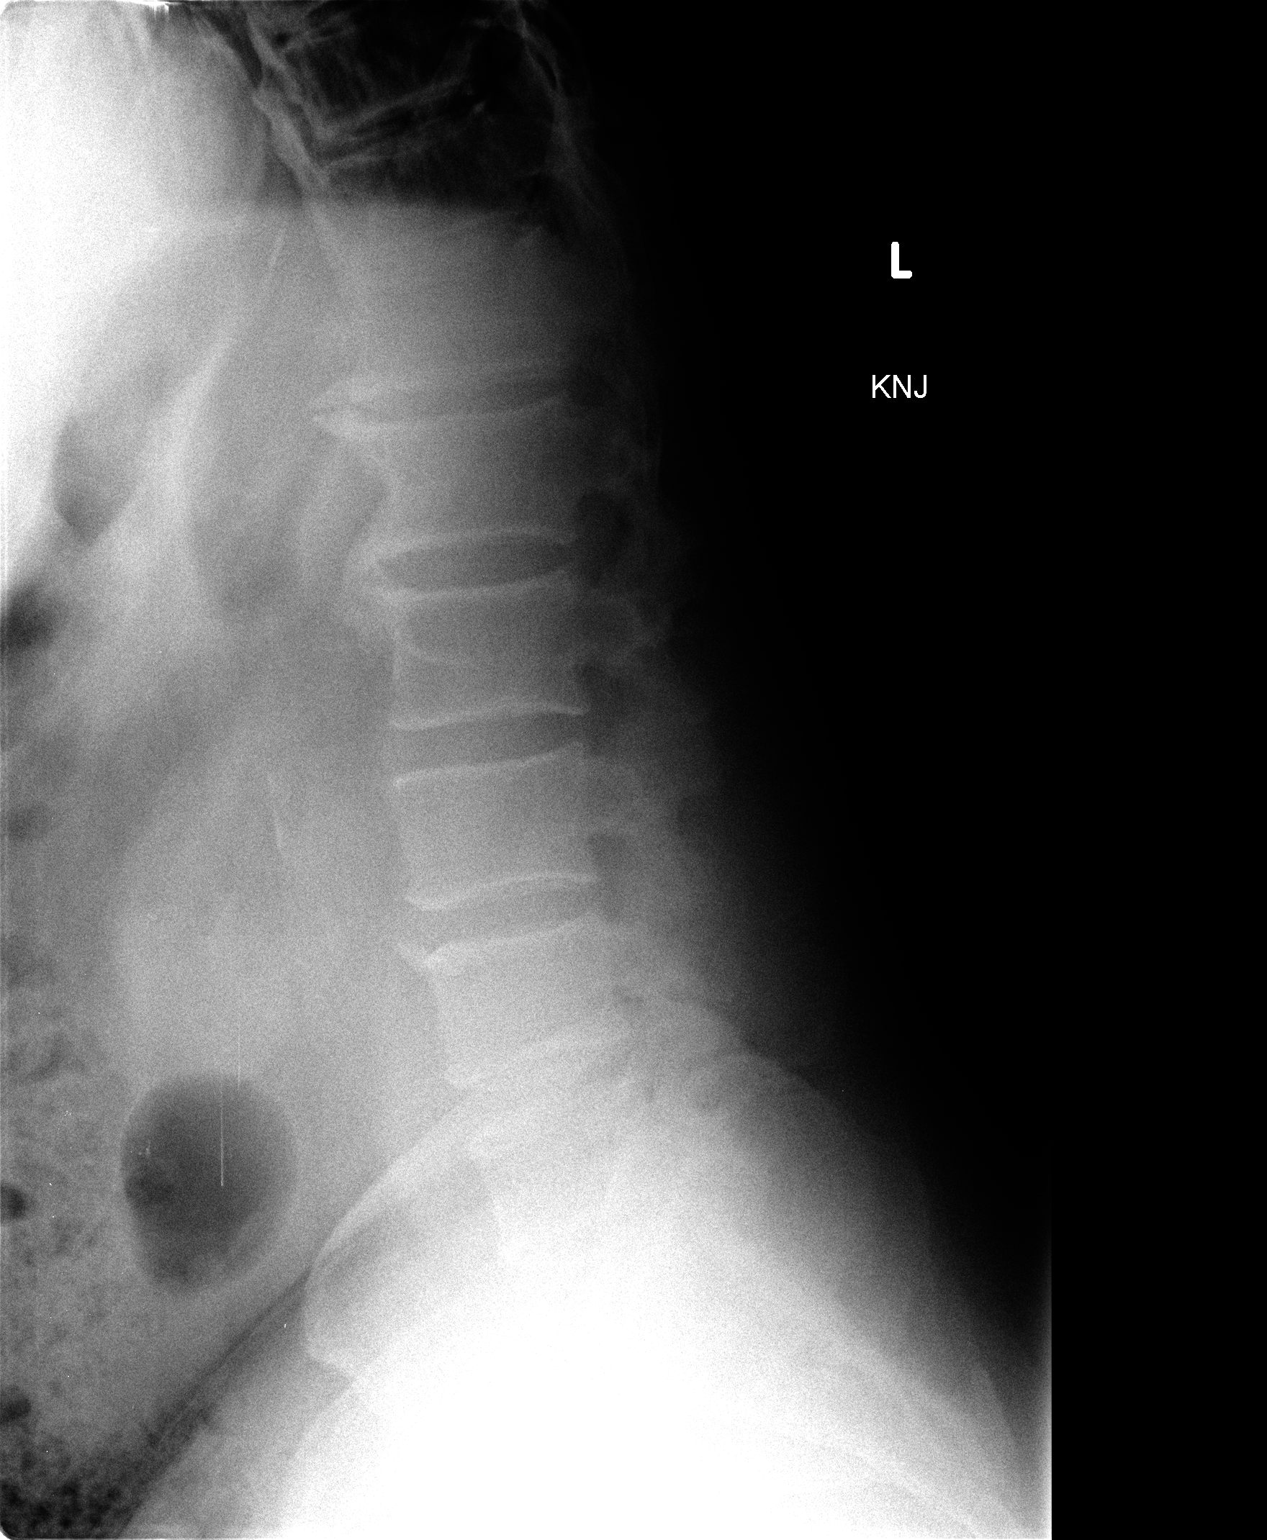

[view not recorded (2 of 2)]
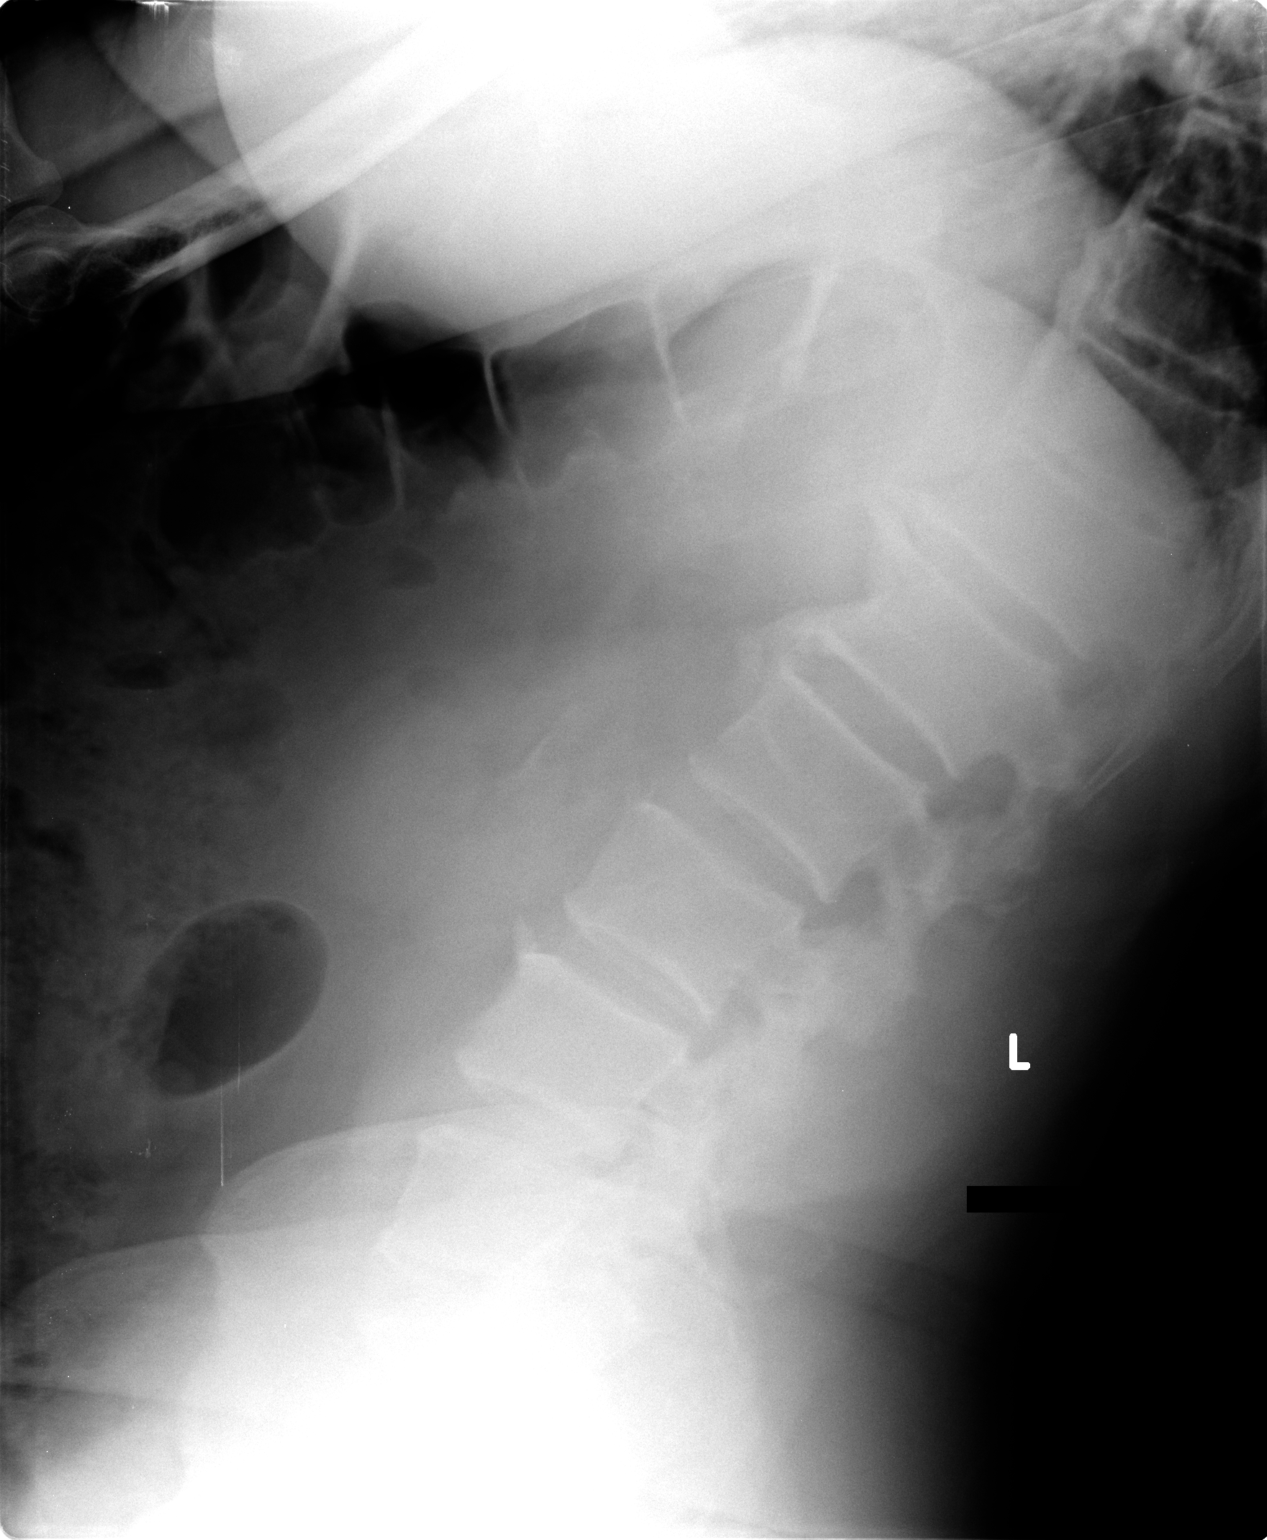

[2 of 2 positions shown; findings below may reference images not displayed]

FINDINGS: Lateral flexion and extension views show normal
alignment.  There is disc space narrowing at L5-S1.  There are
bridging osteophytes in the lower thoracic region.
IMPRESSION: No abnormal motion with flexion and extension.  Disc space
narrowing L5-S1.  Lower thoracic bridging osteophytes.

## 2010-12-22 MED ORDER — TRAMADOL HCL 50 MG PO TABS: 50.0000 mg | ORAL_TABLET | Freq: Four times a day (QID) | ORAL | Status: DC | PRN

## 2010-12-22 NOTE — Progress Notes (Signed)
  Subjective:    Patient ID: Edwin [name], male    DOB: [birth year], 65 y.o.   MRN: [id number]  HPI HIP PAIN: Location:both buttocks  Onset:3 months as discomfort in LS area Trigger/injury:no Pain quality:aching Pain severity:up to 4 Duration:constant Radiation:no Exacerbating factors:climbing stairs carrying weight (Ex suitcases) Treatment/response:Aleve / "takes edge off it"     Review of Systems Constitutional: no fever, chills, sweats, change in weight  Musculoskeletal:no  muscle cramps or pain; no  joint stiffness, redness, or swelling Skin:no rash, color change Neuro: no weakness; incontinence (stool/urine); numbness and tingling Heme:no lymphadenopathy; abnormal bruising or bleeding         Objective:   Physical Exam Gen.: Healthy and well-nourished in appearance. Alert, appropriate and cooperative throughout exam. Neck: No deformities, masses, or tenderness noted. Range of motion normal.  Abdomen: Abdomen soft and nontender. No masses, organomegaly or hernias noted.                                                                         Musculoskeletal/extremities: No deformity or scoliosis noted of  the thoracic or lumbar spine. No clubbing, cyanosis, edema, or deformity noted. Tone & strength  normal.Joints normal. Nail health  good. He is able to lie flat and rise from the examining table without help. There is no pain to percussion over the lumbosacral spine. Straight leg raising is negative. There is no pain with full range of motion testing  at the hips. Vascular:  dorsalis pedis and  posterior tibial pulses are full and equal.  Neurologic: Alert and oriented x3. Deep tendon reflexes symmetrical and normal. Gait including heel and toe walking is normal.         Skin: keratotic skin changes Lymph: No cervical, axillary lymphadenopathy present. Psych: Mood and affect are normal. Normally interactive                                                                                          Assessment & Plan:  #1 low back/buttocks pain. Rule out spinal stenosis. There is no evidence of acute disc disease or degenerative joint disease of the hips clinically  Plan: Films of lumbosacral spine as baseline  Trial of tramadol 50 mg every 6 hours as needed.

## 2010-12-22 NOTE — Patient Instructions (Signed)
Order for x-rays entered into  the computer; these will be performed at [address] across from [facility]. No appointment is necessary. 

## 2011-01-11 ENCOUNTER — Other Ambulatory Visit: Payer: Self-pay | Admitting: Internal Medicine

## 2011-01-11 MED ORDER — ROSUVASTATIN CALCIUM 20 MG PO TABS: 20.0000 mg | ORAL_TABLET | ORAL | Status: DC

## 2011-01-11 NOTE — Telephone Encounter (Signed)
RX sent to pharmacy  

## 2011-03-27 ENCOUNTER — Encounter: Payer: Self-pay | Admitting: Family Medicine

## 2011-03-27 ENCOUNTER — Encounter: Payer: Self-pay | Admitting: Internal Medicine

## 2011-03-27 ENCOUNTER — Ambulatory Visit (INDEPENDENT_AMBULATORY_CARE_PROVIDER_SITE_OTHER): Payer: BC Managed Care – PPO | Admitting: Family Medicine

## 2011-03-27 VITALS — BP 158/83 | HR 65 | Temp 97.8°F | Ht 68.25 in | Wt 244.0 lb

## 2011-03-27 DIAGNOSIS — J40 Bronchitis, not specified as acute or chronic: Secondary | ICD-10-CM

## 2011-03-27 MED ORDER — AZITHROMYCIN 250 MG PO TABS: ORAL_TABLET | ORAL | Status: DC

## 2011-03-27 NOTE — Assessment & Plan Note (Signed)
Prolonged/lingering symptoms. Will do trial of azithromycin to cover atypical bacterial causes. Nasonex sample given. Trial of mucinex DM or robitussin DM otc as directed on the box. May use OTC nasal saline spray or irrigation solution bid. OTC nonsedating antihistamines prn discussed.  Decongestant use discussed--ok if tolerated in the past w/out side effect and if pt has no hx of HTN. If not significantly improved in 5d he should call or return to me or Dr. Alwyn Ren and I would potentially pursue CXR/blood work.

## 2011-03-27 NOTE — Progress Notes (Signed)
OFFICE NOTE  03/27/2011  CC:  Chief Complaint  Patient presents with  . Cough    x 1 month, throat swollen, sneezing     HPI:   Patient is a 65 y.o. Caucasian male, patient of Dr. Alwyn Ren, who is here for respiratory problems. Pt presents complaining of respiratory symptoms for 30+  days.  Mostly nasal congestion/runny nose, sneezing, and PND cough.  Worst symptoms seems to be the ongoing nasal mucous and nagging cough.  Lately the symptoms seem to be improving some, but slowly. No fevers, no wheezing, and no SOB.  No pain in face or teeth.  No significant HA.  No ST.   Symptoms improved by mucinex and actifed (was on these briefly early in the illness. Smoker? no Recent sick contact? no Muscle or joint aches? no No flu vaccine this season, does not want one. ROS: no n/v/d or abdominal pain.  No rash.  No neck stiffness.   +Mild fatigue.  +Mild appetite loss.    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 No T/A/Ds.  MEDS;   Outpatient Prescriptions Prior to Visit  Medication Sig Dispense Refill  . aspirin 81 MG tablet Take 81 mg by mouth daily.        . Cholecalciferol (VITAMIN D3) 1000 UNITS CAPS Take by mouth daily.        . metoprolol (TOPROL-XL) 50 MG 24 hr tablet Take 1 tablet (50 mg total) by mouth daily.  90 tablet  3  . Multiple Vitamin (MULTIVITAMIN) tablet Take 1 tablet by mouth daily.        . Omega-3 Fatty Acids (OMEGA 3 PO) Take by mouth daily.        . rosuvastatin (CRESTOR) 20 MG tablet Take 1 tablet (20 mg total) by mouth every other day.  30 tablet  5  . Saw Palmetto, Serenoa repens, (SAW PALMETTO PO) Take by mouth daily.        . traMADol (ULTRAM) 50 MG tablet Take 1 tablet (50 mg total) by mouth every 6 (six) hours as needed for pain.  30 tablet  1    PE: Blood pressure 158/83, pulse 65, temperature 97.8 F (36.6 C), temperature  source Oral, height 5' 8.25" (1.734 m), weight 244 lb (110.678 kg), SpO2 97.00%. VS: noted--normal. Gen: alert, NAD, NONTOXIC APPEARING. HEENT: eyes without injection, drainage, or swelling.  Ears: EACs clear, TMs with normal light reflex and landmarks.  Nose: Clear rhinorrhea, with some dried, crusty exudate adherent to mildly injected mucosa.  No purulent d/c.  No paranasal sinus TTP.  No facial swelling.  Throat and mouth without focal lesion.  No pharyngial swelling, erythema, or exudate.   Neck: supple, no LAD.   LUNGS: CTA bilat, nonlabored resps.   CV: RRR, no m/r/g. EXT: no c/c/e SKIN: no rash    IMPRESSION AND PLAN:  Laryngotracheobronchitis Prolonged/lingering symptoms. Will do trial of azithromycin to cover atypical bacterial causes. Nasonex sample given. Trial of mucinex DM or robitussin DM otc as directed on the box. May use OTC nasal saline spray or irrigation solution bid. OTC nonsedating antihistamines prn discussed.  Decongestant use discussed--ok if tolerated in the past w/out side effect and if pt has no hx of HTN. If not significantly improved in 5d he should call or return to me or Dr. Alwyn Ren and I would potentially pursue CXR/blood work.  FOLLOW UP:  Return if symptoms worsen or fail to improve.

## 2011-03-27 NOTE — Patient Instructions (Signed)
Trial of mucinex DM or robitussin DM otc as directed on the box. May use OTC nasal saline spray or irrigation solution bid. OTC nonsedating antihistamines prn discussed (zyrtec generic).   Avoid OTC decongestant meds/sprays.

## 2011-04-03 ENCOUNTER — Ambulatory Visit: Payer: BC Managed Care – PPO | Admitting: Family

## 2011-04-24 ENCOUNTER — Telehealth: Payer: Self-pay | Admitting: *Deleted

## 2011-04-24 NOTE — Telephone Encounter (Signed)
Last OV 03-27-11 at [facility] per unable to get into our office, pt advised that he is still having cold like symptoms, heavy cough, no fever noted, body is achy, he was given a z-pack on last visit, does he need to come in or can he just get something else sent out for him?

## 2011-04-24 NOTE — Telephone Encounter (Signed)
Office visit 12/19; chest x-ray and blood count may be necessary.

## 2011-04-24 NOTE — Telephone Encounter (Signed)
Left voicemail for pt to advise we scheduled appt for tomorrow 04-25-11 at 1pm per MD Alwyn Ren advised he has not been seen since 03-27-11 and may need chest xray and blood count, advised in vm to call in am on 04-25-11 if he can not make this appt

## 2011-04-24 NOTE — Telephone Encounter (Signed)
Patient needs appointment Per Dr.Hopper this was almost a month ago that patient was seen

## 2011-04-25 ENCOUNTER — Encounter: Payer: Self-pay | Admitting: Internal Medicine

## 2011-04-25 ENCOUNTER — Ambulatory Visit (INDEPENDENT_AMBULATORY_CARE_PROVIDER_SITE_OTHER): Payer: BC Managed Care – PPO | Admitting: Internal Medicine

## 2011-04-25 VITALS — BP 132/72 | HR 76 | Temp 98.6°F | Wt 240.8 lb

## 2011-04-25 DIAGNOSIS — J209 Acute bronchitis, unspecified: Secondary | ICD-10-CM

## 2011-04-25 MED ORDER — AMOXICILLIN-POT CLAVULANATE 875-125 MG PO TABS: 1.0000 | ORAL_TABLET | Freq: Two times a day (BID) | ORAL | Status: AC

## 2011-04-25 MED ORDER — HYDROCODONE-HOMATROPINE 5-1.5 MG/5ML PO SYRP: 5.0000 mL | ORAL_SOLUTION | Freq: Four times a day (QID) | ORAL | Status: AC | PRN

## 2011-04-25 MED ORDER — MOMETASONE FURO-FORMOTEROL FUM 200-5 MCG/ACT IN AERO: 2.0000 | INHALATION_SPRAY | Freq: Two times a day (BID) | RESPIRATORY_TRACT | Status: DC

## 2011-04-25 MED ORDER — PREDNISONE 20 MG PO TABS: 20.0000 mg | ORAL_TABLET | Freq: Two times a day (BID) | ORAL | Status: AC

## 2011-04-25 NOTE — Patient Instructions (Signed)
Plain Mucinex for thick secretions ;force NON dairy fluids . Use a Neti pot daily as needed for sinus congestion Dulera 2  inhalations every 12 hours; gargle and spit after use  

## 2011-04-25 NOTE — Progress Notes (Signed)
  Subjective:    Patient ID: Edwin [name], male    DOB: 12/02/1945, 65 y.o.   MRN: [id number]  HPI Respiratory tract infection Onset/symptoms:12/13 as cough & nasal congestion Exposures (illness/environmental/extrinsic):no Progression of symptoms:stable Treatments/response:Zpack within past 2 weeks Present symptoms: Fever/chills/sweats:minimmla temp 12/18 Frontal headache:no Facial pain:no Nasal purulence:white to clear Sore throat:as of 12/16 Dental pain:no Lymphadenopathy:no Wheezing/shortness of breath:no Cough/sputum/hemoptysis:scant white sputum Pleuritic pain:no Associated extrinsic/allergic symptoms:itchy eyes/ sneezing:yes last week with Nasonex           Review of Systems     Objective:   Physical Exam  General appearance is of good health and nourishment; no acute distress or increased work of breathing is present.  No  lymphadenopathy about the head, neck, or axilla noted.   Eyes: No conjunctival inflammation or lid edema is present.   Ears:  External ear exam shows no significant lesions or deformities.  Otoscopic examination reveals clear canals, tympanic membranes are intact bilaterally without bulging, retraction, inflammation or discharge.  Nose:  External nasal examination shows no deformity or inflammation. Nasal mucosa are pink and moist without lesions or exudates. No septal dislocation.No obstruction to airflow.   Oral exam: Dental hygiene is good; lips and gums are healthy appearing.There is no oropharyngeal erythema or exudate noted.      Heart:  Normal rate and regular rhythm. S1 and S2 normal without gallop, murmur, click, rub or other extra sounds.   Lungs: He has diffuse, homogenous, musical wheezing in all lung fields. He has intermittent paroxysms of severe coughing.  No increased work of breathing.    Extremities:  No cyanosis, edema, or clubbing  noted    Skin: Warm & dry            Assessment & Plan:   #1 postinfectious  reactive airways disease with severe paroxysmal coughing and musical wheezing.  The pathophysiology of reactive airways was explained. He'll be placed on a short course of prednisone along with Dulera  2 puffs every 12 hours. He'll be given a prescription for antibiotics should he have fever and purulent secretions.

## 2011-05-29 ENCOUNTER — Ambulatory Visit (INDEPENDENT_AMBULATORY_CARE_PROVIDER_SITE_OTHER): Payer: BC Managed Care – PPO | Admitting: Internal Medicine

## 2011-05-29 ENCOUNTER — Encounter: Payer: Self-pay | Admitting: Internal Medicine

## 2011-05-29 VITALS — BP 122/78 | HR 71 | Temp 98.4°F | Resp 12 | Ht 68.0 in | Wt 243.8 lb

## 2011-05-29 DIAGNOSIS — Z0000 Encounter for general adult medical examination without abnormal findings: Secondary | ICD-10-CM

## 2011-05-29 DIAGNOSIS — R9431 Abnormal electrocardiogram [ECG] [EKG]: Secondary | ICD-10-CM | POA: Insufficient documentation

## 2011-05-29 DIAGNOSIS — E785 Hyperlipidemia, unspecified: Secondary | ICD-10-CM

## 2011-05-29 NOTE — Patient Instructions (Signed)
Preventive Health Care: Exercise at least 30-45 minutes a day,  3-4 days a week.  Eat a low-fat diet with lots of fruits and vegetables, up to 7-9 servings per day. Consume less than 40 grams of sugar per day from foods & drinks with High Fructose Corn Sugar as # 1,2,3 or # 4 on label. Please  schedule fasting Labs : BMET,Lipids, hepatic panel, CBC & dif, TSH. PLEASE BRING THESE INSTRUCTIONS TO FOLLOW UP  LAB APPOINTMENT.This will guarantee correct labs are drawn, eliminating need for repeat blood sampling ( needle sticks ! ). Diagnoses /Codes: V70.0   

## 2011-05-29 NOTE — Progress Notes (Signed)
Subjective:    Patient ID: Edwin [name], male    DOB: 13-Dec-1945, 66 y.o.   MRN: [id number]  HPI  Edwin [name]  is here for a physical; he denies acute issues .      Review of Systems HYPERTENSION: Disease Monitoring: Blood pressure range-not checked Chest pain, palpitations- no exertional pain       Dyspnea- no Medications: Compliance- yes  Lightheadedness,Syncope- no    Edema- no  FASTING HYPERGLYCEMIA, PMH of: Polyuria/phagia/dipsia- no      Visual problems- no  HYPERLIPIDEMIA: Disease Monitoring: See symptoms for Hypertension Medications: Compliance- yes  Abd pain, bowel changes- no   Muscle aches- no  The  reactive airways disease signs and symptoms resolved with the inhaled bronchodilator/steroid.          Objective:   Physical Exam Gen.: Healthy and well-nourished in appearance. Alert, appropriate and cooperative throughout exam. Head: Normocephalic without obvious abnormalities  Eyes: No corneal or conjunctival inflammation noted. Pupils equal round reactive to light and accommodation. Fundal exam is benign without hemorrhages, exudate, papilledema. Extraocular motion intact. Vision grossly normal with lenses. Ears: External  ear exam reveals no significant lesions or deformities. Canals; some wax accumulation.TMs normal. Hearing is grossly normal bilaterally. Nose: External nasal exam reveals no deformity or inflammation. Nasal mucosa are pink and moist. No lesions or exudates noted.   Mouth: Oral mucosa and oropharynx reveal no lesions or exudates. Teeth in good repair. Neck: No deformities, masses, or tenderness noted. Range of motion & Thyroid normal. Lungs: Normal respiratory effort; chest expands symmetrically. Lungs are clear to auscultation without rales, wheezes, or increased work of breathing. Heart: Normal rate and rhythm. Normal S1 and S2. No gallop, click, or rub. S 4 w/o  murmur. Abdomen: Bowel sounds normal; abdomen soft and nontender. No masses,  organomegaly or hernias noted. Genitalia/ DRE: Varices present on the left. Prostate is normal without enlarged, asymmetry, nodularity, or induration.                                                                                   Musculoskeletal/extremities: No deformity or scoliosis noted of  the thoracic or lumbar spine. No clubbing, cyanosis, edema, or deformity noted. Range of motion  normal .Tone & strength  normal.Joints normal. Nail health  good. Vascular: Carotid, radial artery, dorsalis pedis and  posterior tibial pulses are full and equal. No bruits present. Neurologic: Alert and oriented x3. Deep tendon reflexes symmetrical and normal.          Skin: Intact without suspicious lesions or rashes. Lymph: No cervical, axillary, or inguinal lymphadenopathy present. Psych: Mood and affect are normal. Normally interactive                                                                                        Assessment & Plan:  #1  comprehensive physical exam; no acute findings #2 see Problem List with Assessments & Recommendations Plan: see Orders

## 2011-06-06 ENCOUNTER — Other Ambulatory Visit: Payer: Self-pay | Admitting: Internal Medicine

## 2011-06-06 DIAGNOSIS — Z0000 Encounter for general adult medical examination without abnormal findings: Secondary | ICD-10-CM

## 2011-06-07 ENCOUNTER — Other Ambulatory Visit (INDEPENDENT_AMBULATORY_CARE_PROVIDER_SITE_OTHER): Payer: BC Managed Care – PPO

## 2011-06-07 DIAGNOSIS — Z0000 Encounter for general adult medical examination without abnormal findings: Secondary | ICD-10-CM

## 2011-06-07 LAB — BASIC METABOLIC PANEL
CO2: 26 mEq/L (ref 19–32)
Chloride: 107 mEq/L (ref 96–112)
Creatinine, Ser: 0.8 mg/dL (ref 0.4–1.5)
Sodium: 141 mEq/L (ref 135–145)

## 2011-06-07 LAB — CBC WITH DIFFERENTIAL/PLATELET
Eosinophils Relative: 2.7 % (ref 0.0–5.0)
HCT: 42.1 % (ref 39.0–52.0)
Hemoglobin: 14.6 g/dL (ref 13.0–17.0)
Lymphs Abs: 1.2 10*3/uL (ref 0.7–4.0)
Monocytes Relative: 10.3 % (ref 3.0–12.0)
Neutro Abs: 2.7 10*3/uL (ref 1.4–7.7)
RBC: 4.35 Mil/uL (ref 4.22–5.81)
WBC: 4.6 10*3/uL (ref 4.5–10.5)

## 2011-06-07 LAB — LIPID PANEL
HDL: 42.6 mg/dL (ref >39.00)
LDL Cholesterol: 75 mg/dL (ref 0–99)
Total CHOL/HDL Ratio: 3
Triglycerides: 155 mg/dL — ABNORMAL HIGH (ref 0.0–149.0)

## 2011-06-07 LAB — HEPATIC FUNCTION PANEL: Albumin: 3.9 g/dL (ref 3.5–5.2)

## 2011-06-13 LAB — HEMOGLOBIN A1C: Hgb A1c MFr Bld: 5.9 % (ref 4.6–6.5)

## 2011-08-17 ENCOUNTER — Encounter: Payer: Self-pay | Admitting: Internal Medicine

## 2011-08-17 ENCOUNTER — Ambulatory Visit (INDEPENDENT_AMBULATORY_CARE_PROVIDER_SITE_OTHER): Payer: BC Managed Care – PPO | Admitting: Internal Medicine

## 2011-08-17 VITALS — BP 130/72 | HR 68 | Temp 98.4°F | Wt 249.0 lb

## 2011-08-17 DIAGNOSIS — J209 Acute bronchitis, unspecified: Secondary | ICD-10-CM

## 2011-08-17 MED ORDER — AMOXICILLIN-POT CLAVULANATE 875-125 MG PO TABS: 1.0000 | ORAL_TABLET | Freq: Two times a day (BID) | ORAL | Status: AC

## 2011-08-17 MED ORDER — HYDROCODONE-HOMATROPINE 5-1.5 MG/5ML PO SYRP: 5.0000 mL | ORAL_SOLUTION | Freq: Four times a day (QID) | ORAL | Status: AC | PRN

## 2011-08-17 NOTE — Progress Notes (Signed)
  Subjective:    Patient ID: Edwin [name], male    DOB: Sep 18, 1945, 66 y.o.   MRN: [id number]  HPI Respiratory tract infection Onset/symptoms:08/13/11 as rhinitis, sneezing & eyes watery Exposures (illness/environmental/extrinsic):no except pollen Progression of symptoms:to deep cough Treatments/response:ASA, Robitussin DM w/o dramatic response Present symptoms: Fever/chills/sweats:no Frontal headache:no Facial pain:no Nasal purulence:clear Sore throat:yes from PNDrainage Dental pain:no Lymphadenopathy:no Wheezing/shortness of breath:no Cough/sputum/hemoptysis:yellow Pleuritic pain:no Past medical history: Seasonal allergies : no/asthma:no but RAD with bronchitis          Review of Systems     Objective:   Physical Exam General appearance:good health ;well nourished; no acute distress or increased work of breathing is present.  No  lymphadenopathy about the head, neck, or axilla noted.   Eyes: No conjunctival inflammation or lid edema is present.   Ears:  External ear exam shows no significant lesions or deformities.  Otoscopic examination reveals wax bilaterally Nose:  External nasal examination shows no deformity or inflammation. Nasal mucosa are pink and moist without lesions or exudates. No septal dislocation or deviation.No obstruction to airflow. Hyponasal speech   Oral exam: Dental hygiene is good; lips and gums are healthy appearing.There is no oropharyngeal erythema or exudate noted.    Heart:  Normal rate and regular rhythm. S1 and S2 normal without gallop, murmur, click, rub or other extra sounds.   Lungs: Initially with deep inspiration low-grade rhonchi were noted at the bases. This could not be elicited with additional inspiratory maneuvers. There was minimal residual rales at the bases without significant wheezing. He exhibited a nonproductive raspy cough.No increased work of breathing.    Extremities:  No cyanosis, edema, or clubbing  noted    Skin:  Warm & dry           Assessment & Plan:  #1 acute bronchitis w/o definite bronchospasm Plan: See orders and recommendations

## 2011-08-17 NOTE — Patient Instructions (Signed)
Plain Mucinex for thick secretions ;force NON dairy fluids for next 48 hrs. Use a Neti pot daily as needed for sinus congestion.  Nasal cleansing in the shower as discussed. Make sure that all residual soap is removed to prevent irritation.  Nasonex 1 spray in each nostril twice a day as needed. Use the "crossover" technique as discussed

## 2011-08-30 ENCOUNTER — Telehealth: Payer: Self-pay | Admitting: *Deleted

## 2011-08-30 NOTE — Telephone Encounter (Signed)
[facility] Triage Call Report Triage Record Num: [id number] Operator: Judeen Hammans Patient Name: Edwin [name] Call Date & Time: 08/29/2011 9:56:24PM Patient Phone: [phone] PCP: Marga Melnick Patient Gender: Male PCP Fax : [phone] Patient DOB: 04-12-1946 Practice Name: [facility] Reason for Call: Caller: Delbert/Patient; PCP: Marga Melnick; CB#: [phone]; Call regarding Rash/Hives; Onset 08/29/11. Pt states rash is itchy with hives and localized to legs, under arms, head. All emergent sxs r/o per Rash Guideline. Home care advice given. Protocol(s) Used: Hives Protocol(s) Used: Rash Recommended Outcome per Protocol: See Provider within 2 Weeks Reason for Outcome: Localized or widespread rash All other situations Care Advice: ~ Avoid touching or scratching the lesions; wash hands frequently with mild soap and water. ~ Apply a shake lotion (e.g. calamine) after each compress treatment. DO NOT apply to the eyes or genitals. ~ Apply cool compresses for 15 to 20 minutes 4 to 6 times a day to relieve discomfort. ~ Wear loose-fitting, non-restricting clothing. Cool/tepid showers or baths may help relieve itching. If cool water alone does not relieve itching, try adding 1/2 to 1 cup baking soda or colloidal oatmeal (Aveeno) to bath water. ~ Avoid use of perfumed or strong soaps, detergents or any product that is irritating to the skin. Only use mild, unscented soap (Aveeno, Neutrogena) for bathing to decrease irritation. ~ ~ Call provider if no improvement in symptoms in 7 to 14 days. ~ SYMPTOM / CONDITION MANAGEMENT See provider within 4 hours if having signs and symptoms of local infection such as redness or red streaks, tenderness, warmth, swelling, or purulent drainage). ~ ~ CAUTIONS For symptom relief, consider nonprescription antihistamines (such as Allerest, Claritin, Zyrtec, Chlor-Trimetron, Benadryl, etc.) as directed on label or by pharmacist.  Drowsiness may result, especially in geriatric patients. Non-sedating antihistamines are available without a prescription. ~ 08/29/2011 10:29:57PM

## 2011-09-04 NOTE — Telephone Encounter (Signed)
Pt states that he tried the OTC anti-histamine and rash has resolved. Pt advise if symptoms worsen or return OV would be needed. Pt ok, verbalized understanding.

## 2011-09-05 ENCOUNTER — Other Ambulatory Visit: Payer: Self-pay | Admitting: Internal Medicine

## 2011-09-05 MED ORDER — METOPROLOL SUCCINATE ER 50 MG PO TB24: 50.0000 mg | ORAL_TABLET | Freq: Every day | ORAL | Status: DC

## 2011-09-05 NOTE — Telephone Encounter (Signed)
refill for Metoprolol Succ ER 50MG  Qty 90 Take one tablet by mouth daily Last filled 4.27.13  Last OV 4.12.13

## 2011-09-05 NOTE — Telephone Encounter (Signed)
RX sent

## 2012-02-04 ENCOUNTER — Other Ambulatory Visit: Payer: Self-pay | Admitting: Internal Medicine

## 2012-02-04 MED ORDER — ROSUVASTATIN CALCIUM 20 MG PO TABS: 20.0000 mg | ORAL_TABLET | ORAL | Status: DC

## 2012-02-04 NOTE — Telephone Encounter (Signed)
refill CRESTOR 20 MG Take 1 tablet (20 mg total) by mouth every other day. #30 last fill 7.9.13--last ov 4.12.13 hopper

## 2012-02-29 ENCOUNTER — Other Ambulatory Visit: Payer: Self-pay | Admitting: Internal Medicine

## 2012-02-29 MED ORDER — METOPROLOL SUCCINATE ER 50 MG PO TB24: 50.0000 mg | ORAL_TABLET | Freq: Every day | ORAL | Status: DC

## 2012-02-29 NOTE — Telephone Encounter (Signed)
refill Metoprolol Succ ER 50 MG Take 1 tablet (50 mg total) by mouth daily. #90 last fill 7.31.13--last ov 4.12.13-acute

## 2012-02-29 NOTE — Telephone Encounter (Signed)
Refill done.  

## 2012-05-07 HISTORY — PX: COLONOSCOPY: SHX174

## 2012-05-20 ENCOUNTER — Ambulatory Visit (INDEPENDENT_AMBULATORY_CARE_PROVIDER_SITE_OTHER): Payer: BC Managed Care – PPO | Admitting: Internal Medicine

## 2012-05-20 ENCOUNTER — Encounter: Payer: Self-pay | Admitting: Internal Medicine

## 2012-05-20 VITALS — BP 124/82 | HR 80 | Temp 98.2°F | Resp 14 | Ht 68.03 in | Wt 252.0 lb

## 2012-05-20 DIAGNOSIS — I10 Essential (primary) hypertension: Secondary | ICD-10-CM

## 2012-05-20 DIAGNOSIS — Z85828 Personal history of other malignant neoplasm of skin: Secondary | ICD-10-CM

## 2012-05-20 DIAGNOSIS — Z23 Encounter for immunization: Secondary | ICD-10-CM

## 2012-05-20 DIAGNOSIS — E785 Hyperlipidemia, unspecified: Secondary | ICD-10-CM

## 2012-05-20 DIAGNOSIS — Z860100 Personal history of colon polyps, unspecified: Secondary | ICD-10-CM

## 2012-05-20 DIAGNOSIS — I4510 Unspecified right bundle-branch block: Secondary | ICD-10-CM

## 2012-05-20 DIAGNOSIS — Z0000 Encounter for general adult medical examination without abnormal findings: Secondary | ICD-10-CM

## 2012-05-20 NOTE — Progress Notes (Signed)
  Subjective:    Patient ID: Edwin [name], male    DOB: Oct 05, 1945, 67 y.o.   MRN: [id number]  HPI  Edwin [name] is here for a physical;he denies significant health issues .      Review of Systems HYPERTENSION: Disease Monitoring: Blood pressure range-not monitoring but normal @ appts Chest pain, palpitations- no       Dyspnea- no Medications: Compliance- yes  Lightheadedness,Syncope- no   Edema- occasionally  FASTING HYPERGLYCEMIA, PMH of:  Polyuria/phagia/dipsia- no       Visual problems- no  HYPERLIPIDEMIA: Disease Monitoring: See symptoms for Hypertension Medications: Compliance- yes  Abd pain, bowel changes- no   Muscle aches- some leg aches      Objective:   Physical Exam Gen.:  well-nourished in appearance. Alert, appropriate and cooperative throughout exam. Appears younger than stated age  Head: Normocephalic without obvious abnormalities Eyes: No corneal or conjunctival inflammation noted. Pupils equal round reactive to light and accommodation. Extraocular motion intact. Vision grossly normal with lenses. Ears: External  ear exam reveals no significant lesions or deformities. Canals clear .TMs normal. Hearing is grossly normal bilaterally. Nose: External nasal exam reveals no deformity or inflammation. Nasal mucosa are pink and moist. No lesions or exudates noted.  Mouth: Oral mucosa and oropharynx reveal no lesions or exudates. Teeth in good repair.Osteoma of hard palate Neck: No deformities, masses, or tenderness noted. Range of motion & Thyroid normal. Lungs: Normal respiratory effort; chest expands symmetrically. Lungs are clear to auscultation without rales, wheezes, or increased work of breathing. Heart: Normal rate and rhythm. Normal S1 and S2. No gallop, click, or rub. S4 w/o  murmur. Abdomen: Bowel sounds normal; abdomen soft and nontender. No masses, organomegaly or hernias noted. Genitalia: Genitalia normal except for left varices. Prostate is normal without  enlargement, asymmetry, nodularity, or induration.                                    Musculoskeletal/extremities: No deformity or scoliosis noted of  the thoracic or lumbar spine. No clubbing, cyanosis, edema, or significant extremity  deformity noted. Range of motion normal .Tone & strength  normal.Joints normal . Nail health good. Able to lie down & sit up w/o help. Negative SLR bilaterally Vascular: Carotid, radial artery, dorsalis pedis and  posterior tibial pulses are full and equal. No bruits present. Neurologic: Alert and oriented x3. Deep tendon reflexes symmetrical and normal. Gait normal.        Skin: Intact without suspicious lesions or rashes. Lymph: No cervical, axillary, or inguinal lymphadenopathy present. Psych: Mood and affect are normal. Normally interactive                                                                                         Assessment & Plan:  #1 comprehensive physical exam; no acute findings  Plan: see Recommendations/Orders

## 2012-05-20 NOTE — Patient Instructions (Addendum)
Take the EKG to any emergency room or preop visits. There are nonspecific changes; as long as there is no new change these are not clinically significant. Please  schedule fasting Labs first week of February : BMET,Lipids, hepatic panel, CBC & dif, TSH,A1c. PLEASE BRING THESE INSTRUCTIONS TO FOLLOW UP  LAB APPOINTMENT.This will guarantee correct labs are drawn, eliminating need for repeat blood sampling ( needle sticks ! ). Diagnoses /Codes:V70.0.  If you activate My Chart; the results can be released to you as soon as they populate from the lab. If you choose not to use this program; the labs have to be reviewed, copied & mailed   causing a delay in getting the results to you.

## 2012-05-21 ENCOUNTER — Encounter: Payer: Self-pay | Admitting: Internal Medicine

## 2012-05-21 DIAGNOSIS — Z1211 Encounter for screening for malignant neoplasm of colon: Secondary | ICD-10-CM

## 2012-05-30 ENCOUNTER — Encounter: Payer: Self-pay | Admitting: Internal Medicine

## 2012-06-03 ENCOUNTER — Other Ambulatory Visit: Payer: Self-pay | Admitting: Internal Medicine

## 2012-06-09 ENCOUNTER — Telehealth: Payer: Self-pay | Admitting: Internal Medicine

## 2012-06-09 DIAGNOSIS — E785 Hyperlipidemia, unspecified: Secondary | ICD-10-CM

## 2012-06-09 DIAGNOSIS — I10 Essential (primary) hypertension: Secondary | ICD-10-CM

## 2012-06-09 DIAGNOSIS — T887XXA Unspecified adverse effect of drug or medicament, initial encounter: Secondary | ICD-10-CM

## 2012-06-09 DIAGNOSIS — Z0000 Encounter for general adult medical examination without abnormal findings: Secondary | ICD-10-CM

## 2012-06-09 NOTE — Telephone Encounter (Signed)
Patient states he would like to have his physical labs done at [place] location. Pt had CPE on 05/20/12.

## 2012-06-09 NOTE — Telephone Encounter (Signed)
Error. BC °

## 2012-06-09 NOTE — Addendum Note (Signed)
Addended by: Maurice Small on: 06/09/2012 03:12 PM   Modules accepted: Orders

## 2012-06-12 ENCOUNTER — Other Ambulatory Visit (INDEPENDENT_AMBULATORY_CARE_PROVIDER_SITE_OTHER): Payer: BC Managed Care – PPO

## 2012-06-12 ENCOUNTER — Ambulatory Visit: Payer: BC Managed Care – PPO

## 2012-06-12 DIAGNOSIS — E785 Hyperlipidemia, unspecified: Secondary | ICD-10-CM

## 2012-06-12 DIAGNOSIS — T887XXA Unspecified adverse effect of drug or medicament, initial encounter: Secondary | ICD-10-CM

## 2012-06-12 DIAGNOSIS — R7309 Other abnormal glucose: Secondary | ICD-10-CM

## 2012-06-12 DIAGNOSIS — Z0000 Encounter for general adult medical examination without abnormal findings: Secondary | ICD-10-CM

## 2012-06-12 DIAGNOSIS — I10 Essential (primary) hypertension: Secondary | ICD-10-CM

## 2012-06-12 LAB — HEPATIC FUNCTION PANEL
Bilirubin, Direct: 0.1 mg/dL (ref 0.0–0.3)
Total Bilirubin: 0.7 mg/dL (ref 0.3–1.2)

## 2012-06-12 LAB — CBC WITH DIFFERENTIAL/PLATELET
Basophils Absolute: 0 10*3/uL (ref 0.0–0.1)
Eosinophils Relative: 2.8 % (ref 0.0–5.0)
HCT: 43.6 % (ref 39.0–52.0)
Hemoglobin: 15.2 g/dL (ref 13.0–17.0)
Lymphs Abs: 1.4 10*3/uL (ref 0.7–4.0)
MCV: 95.4 fl (ref 78.0–100.0)
Monocytes Absolute: 0.6 10*3/uL (ref 0.1–1.0)
Neutro Abs: 3.2 10*3/uL (ref 1.4–7.7)
Platelets: 195 10*3/uL (ref 150.0–400.0)
RDW: 12.6 % (ref 11.5–14.6)

## 2012-06-12 LAB — BASIC METABOLIC PANEL
BUN: 18 mg/dL (ref 6–23)
Chloride: 106 mEq/L (ref 96–112)
Glucose, Bld: 108 mg/dL — ABNORMAL HIGH (ref 70–99)
Potassium: 4.1 mEq/L (ref 3.5–5.1)
Sodium: 140 mEq/L (ref 135–145)

## 2012-06-12 LAB — TSH: TSH: 3.77 u[IU]/mL (ref 0.35–5.50)

## 2012-06-12 LAB — LIPID PANEL
LDL Cholesterol: 65 mg/dL (ref 0–99)
Total CHOL/HDL Ratio: 4
VLDL: 31.4 mg/dL (ref 0.0–40.0)

## 2012-07-10 ENCOUNTER — Encounter: Payer: BC Managed Care – PPO | Admitting: Internal Medicine

## 2012-08-14 ENCOUNTER — Ambulatory Visit (AMBULATORY_SURGERY_CENTER): Payer: BC Managed Care – PPO | Admitting: *Deleted

## 2012-08-14 VITALS — Ht 68.5 in | Wt 250.0 lb

## 2012-08-14 DIAGNOSIS — Z1211 Encounter for screening for malignant neoplasm of colon: Secondary | ICD-10-CM

## 2012-08-14 MED ORDER — PEG-KCL-NACL-NASULF-NA ASC-C 100 G PO SOLR: ORAL | Status: DC

## 2012-08-15 ENCOUNTER — Encounter: Payer: Self-pay | Admitting: Internal Medicine

## 2012-08-26 ENCOUNTER — Encounter: Payer: BC Managed Care – PPO | Admitting: Internal Medicine

## 2012-12-24 ENCOUNTER — Other Ambulatory Visit: Payer: Self-pay | Admitting: Internal Medicine

## 2012-12-24 DIAGNOSIS — M79671 Pain in right foot: Secondary | ICD-10-CM

## 2012-12-25 ENCOUNTER — Encounter: Payer: Self-pay | Admitting: Internal Medicine

## 2012-12-26 ENCOUNTER — Ambulatory Visit (INDEPENDENT_AMBULATORY_CARE_PROVIDER_SITE_OTHER): Payer: Medicare Other | Admitting: Family Medicine

## 2012-12-26 ENCOUNTER — Encounter: Payer: Self-pay | Admitting: Family Medicine

## 2012-12-26 VITALS — BP 140/86 | HR 62 | Wt 260.0 lb

## 2012-12-26 DIAGNOSIS — M7660 Achilles tendinitis, unspecified leg: Secondary | ICD-10-CM | POA: Diagnosis not present

## 2012-12-26 DIAGNOSIS — M6788 Other specified disorders of synovium and tendon, other site: Secondary | ICD-10-CM | POA: Insufficient documentation

## 2012-12-26 DIAGNOSIS — M7661 Achilles tendinitis, right leg: Secondary | ICD-10-CM

## 2012-12-26 MED ORDER — MELOXICAM 15 MG PO TABS: 15.0000 mg | ORAL_TABLET | Freq: Every day | ORAL | Status: DC

## 2012-12-26 NOTE — Progress Notes (Signed)
I'm seeing this patient by the request  of:  Dr. Alwyn Ren  CC: Right heel pain  HPI: Patient is a very pleasant 67 year old gentleman coming in with a complaint of right heel pain. Patient states that this has been months but does not remember any specific injury. Patient was playing tennis on a regular basis and had noticed a significant amount of discomfort of the posterior aspect of his heel that seemed to go up his calf. Patient states now it seems to be even worse when he walks uphill or certain activities of daily living. Patient describes the pain as a dull aching sensation that can have a tearing sensation with certain movements. Patient states it does seem to be better when he is not walking significant amount of time. Patient describes the severity of 6/10.  Past medical, surgical, family and social history reviewed. Medications reviewed all in the electronic medical record.   Review of Systems: No headache, visual changes, nausea, vomiting, diarrhea, constipation, dizziness, abdominal pain, skin rash, fevers, chills, night sweats, weight loss, swollen lymph nodes, body aches, joint swelling, muscle aches, chest pain, shortness of breath, mood changes.   Objective:    Blood pressure 140/86, pulse 62, weight 260 lb (117.935 kg), SpO2 96.00%.   General: No apparent distress alert and oriented x3 mood and affect normal, dressed appropriately.  HEENT: Pupils equal, extraocular movements intact Respiratory: Patient's speak in full sentences and does not appear short of breath Cardiovascular: No lower extremity edema, non tender, no erythema Skin: Warm dry intact with no signs of infection or rash on extremities or on axial skeleton. Abdomen: Soft nontender Neuro: Cranial nerves II through XII are intact, neurovascularly intact in all extremities with 2+ DTRs and 2+ pulses. Lymph: No lymphadenopathy of posterior or anterior cervical chain or axillae bilaterally.  Gait normal with good  balance and coordination.  MSK: Non tender with full range of motion and good stability and symmetric strength and tone of shoulders, elbows, wrist, hip, knee and  bilaterally.  Ankle: Right No visible erythema or swelling. Patient though does have a nodule on the posterior aspect of his calcaneus at the insertion of the Achilles. This is a Haglund's nodule.  Patient is tender to palpation in this area but no swelling appreciated. This nodule seemed to extend approximately 2 cm from the insertion of the Achilles Range of motion is full in all directions. Strength is 5/5 in all directions. Stable lateral and medial ligaments; squeeze test and kleiger test unremarkable; Talar dome nontender; No pain at base of 5th MT; No tenderness over cuboid; No tenderness over N spot or navicular prominence No tenderness on posterior aspects of lateral and medial malleolus No sign of peroneal tendon subluxations or tenderness to palpation Negative tarsal tunnel tinel's Able to walk 4 steps.  MSK US performed of: Right ankle This study was ordered, performed, and interpreted by Terrilee Files D.O.  Foot/Ankle:   All structures visualized.   Talar dome unremarkable  Ankle mortise without effusion. Peroneus longus and brevis tendons unremarkable on long and transverse views without sheath effusions. Posterior tibialis does have some mild hypoechoic changes surrounding but no tear appreciated.  Flexor hallucis longus, and flexor digitorum longus tendons unremarkable on long and transverse views without sheath effusions. Achilles tendon visualized patient does have nodule as stated above that measures 1.12 cm in diameter. There is some mild hypoechoic changes.  Patient does have some mild neovascularization. No true tear appreciated. Patient is having some calcium deposits near  the insertion.  Anterior Talofibular Ligament and Calcaneofibular Ligaments unremarkable and intact. Deltoid Ligament unremarkable and  intact. Plantar fascia intact and without effusion, normal thickness. No increased doppler signal, cap sign, or thickening of tibial cortex. Power doppler signal normal.  IMPRESSION: Achilles tendinosis with early calcific changes.    Impression and Recommendations:     This case required medical decision making of moderate complexity.

## 2012-12-26 NOTE — Patient Instructions (Addendum)
Achilles Rehab  Begin with easy walking, heel, toe and backwards  Calf raises on a step First lower and then raise on 1 foot If this is painful lower on 1 foot but do the heel raise on both feet  Begin with 3 sets of 10 repetitions  Increase by 5 repetitions every 3 days  Goal is 3 sets of 30 repetitions  Do with both knee straight and knee at 20 degrees of flexion  meloxicam daily for 10 days Ice baths for 20 minutes 2 times a day Heel lift in shoe would help, shoe market or [employer] 1/8 or 3/8 max.  Come back in 3 weeks.

## 2012-12-26 NOTE — Assessment & Plan Note (Signed)
Discussed the anatomy, prognosis and diagnosis. Day patient home exercise program as well as handout and showed proper technique today. Discussed over-the-counter heel lifts orthotics that could be beneficial. Anti-inflammatory prescribed discussed potential side effects Icing protocol Come back in 3 weeks. If still in pain would rescan patient's ankle as well as to do nitroglycerin.

## 2012-12-29 ENCOUNTER — Encounter: Payer: Self-pay | Admitting: Internal Medicine

## 2013-01-13 ENCOUNTER — Ambulatory Visit: Payer: Medicare Other | Admitting: Family Medicine

## 2013-01-15 ENCOUNTER — Ambulatory Visit: Payer: Medicare Other | Admitting: Family Medicine

## 2013-01-19 ENCOUNTER — Ambulatory Visit (INDEPENDENT_AMBULATORY_CARE_PROVIDER_SITE_OTHER): Payer: Medicare Other | Admitting: Family Medicine

## 2013-01-19 ENCOUNTER — Encounter: Payer: Self-pay | Admitting: Family Medicine

## 2013-01-19 VITALS — BP 134/86 | HR 84 | Wt 262.0 lb

## 2013-01-19 DIAGNOSIS — M7661 Achilles tendinitis, right leg: Secondary | ICD-10-CM

## 2013-01-19 DIAGNOSIS — M7660 Achilles tendinitis, unspecified leg: Secondary | ICD-10-CM | POA: Diagnosis not present

## 2013-01-19 NOTE — Assessment & Plan Note (Signed)
The patient has made significant strides and has improved. Patient is doing very well we'll continue the exercises 3 times a week Discuss stopping the anti-inflammatory Continue icing after activities Discussed proper stretching techniques ensured proper form. Patient can follow up on an as-needed basis.

## 2013-01-19 NOTE — Patient Instructions (Signed)
Great to see you doing better Stop the anti inflammatory Exercises 3 times a week no matter what until end of time.  Start tennis but only 1-2 times a week.  Stretches after tennis though.  Ice would still be helpful after activity  Come back only if need me.

## 2013-01-19 NOTE — Progress Notes (Signed)
  Subjective:    CC: Followup for right Achilles tendinitis  HPI: The patient is a very pleasant 67 year old gentleman who had Achilles tendinosis with calcific changes previously seen on ultrasound. Patient states since last, seen him he is approximately 80% better. Patient has been doing the exercises on a daily basis as well as taking meloxicam. Patient has noticed considerable improvement. Patient has started exercising again but has not started playing tennis as of yet. Patient states that he might have some mild soreness but overall significantly better. She denies any new symptoms such as numbness or tingling or any weakness of the lower [place]. Patient is very happy with his results so far.  Past medical history, Surgical history, Family history not pertinant except as noted below, Social history, Allergies, and medications have been entered into the medical record, reviewed, and no changes needed.   Review of Systems: No fevers, chills, night sweats, weight loss, chest pain, or shortness of breath.   Objective:   Blood pressure 134/86, pulse 84, weight 262 lb (118.842 kg), SpO2 98.00%.  General: Well Developed, well nourished, and in no acute distress.  Neuro: Alert and oriented x3, extra-ocular muscles intact, sensation grossly intact.  HEENT: Normocephalic, atraumatic, pupils equal round reactive to light, neck supple, no masses, no lymphadenopathy, thyroid nonpalpable.  Skin: Warm and dry, no rashes. Cardiac:  no lower extremity edema. Respiratory: Not using accessory muscles, speaking in full sentences. Abdominal: NT, soft Gait: Nonantlagic, good balance and coordination Lymphatic: no lymphadenopathy in neck or axillae on palpation, non tender.  Musculoskeletal: Inspection and palpation of the right and left upper extremities including the shoulders elbows and wrist are unremarkable with full range of motion and good muscle strength and tone. Inspection and palpation of the  right and left lower extremities including the hips and knees are unremarkable and nontender with full range of motion and good muscle strength and tone and are symmetric. Right ankle exam shows that patient's fragments nodule still exists but there is no erythema or swelling around it that was previously seen. Patient has full range of motion of the ankles bilaterally and good strength that is symmetric. He is neurovascularly intact distally.  Musculoskeletal  ultrasound that was limited was performed and interpreted by me today. Patient's ultrasound showed the patient has had a decrease in size of the nodule of the Achilles from 1.2 cm previously 20.9 cm. There is also significant decrease hypoechoic changes that was seen previously as well as the calcium deposits have improved by approximately 40% Impression and Recommendations:

## 2013-01-21 ENCOUNTER — Ambulatory Visit (AMBULATORY_SURGERY_CENTER): Payer: Self-pay | Admitting: *Deleted

## 2013-01-21 VITALS — Ht 69.0 in | Wt 263.0 lb

## 2013-01-21 DIAGNOSIS — Z86010 Personal history of colonic polyps: Secondary | ICD-10-CM

## 2013-01-21 MED ORDER — MOVIPREP 100 G PO SOLR: 1.0000 | Freq: Once | ORAL | Status: DC

## 2013-01-21 NOTE — Progress Notes (Signed)
No egg or soy allergy. No anesthesia problems.  

## 2013-02-02 ENCOUNTER — Encounter: Payer: Self-pay | Admitting: Internal Medicine

## 2013-02-02 ENCOUNTER — Ambulatory Visit (AMBULATORY_SURGERY_CENTER): Payer: Medicare Other | Admitting: Internal Medicine

## 2013-02-02 VITALS — BP 136/90 | HR 65 | Temp 96.8°F | Resp 13 | Ht 69.0 in | Wt 263.0 lb

## 2013-02-02 DIAGNOSIS — Z86010 Personal history of colonic polyps: Secondary | ICD-10-CM

## 2013-02-02 MED ORDER — SODIUM CHLORIDE 0.9 % IV SOLN: 500.0000 mL | INTRAVENOUS | Status: DC

## 2013-02-02 NOTE — Op Note (Signed)
[facility] [address] [place], [address]   COLONOSCOPY PROCEDURE REPORT  PATIENT: Tyde, Lamison  MR#: [id number] BIRTHDATE: 1945/10/12 , 67  yrs. old GENDER: Male ENDOSCOPIST: Roxy Cedar, MD REFERRED MW:[id number] Program Recall PROCEDURE DATE:  02/02/2013 PROCEDURE:   Colonoscopy, surveillance First Screening Colonoscopy - Avg.  risk and is 50 yrs.  old or older - No.  Prior Negative Screening - Now for repeat screening. N/A  History of Adenoma - Now for follow-up colonoscopy & has been > or = to 3 yrs.  Yes hx of adenoma.  Has been 3 or more years since last colonoscopy.  Polyps Removed Today? No.  Recommend repeat exam, <10 yrs? No. ASA CLASS:   Class II INDICATIONS:Patient's personal history of adenomatous colon polyps. Negatibe exam 2004 [facility]); 01-2008 [place] - small TA. MEDICATIONS: MAC sedation, administered by CRNA and propofol (Diprivan) 280mg  IV  DESCRIPTION OF PROCEDURE:   After the risks benefits and alternatives of the procedure were thoroughly explained, informed consent was obtained.  A digital rectal exam revealed no abnormalities of the rectum.   The LB [id number]  endoscope was introduced through the anus and advanced to the cecum, which was identified by both the appendix and ileocecal valve. No adverse events experienced.   The quality of the prep was good, using MoviPrep  The instrument was then slowly withdrawn as the colon was fully examined.     COLON FINDINGS: Moderate diverticulosis was noted The finding was in the left colon.   The colon was otherwise normal.  There was no inflammation, polyps or cancers.  Retroflexed views revealed internal hemorrhoids. The time to cecum=3 minutes 24 seconds. Withdrawal time=12 minutes 0 seconds.  The scope was withdrawn and the procedure completed.  COMPLICATIONS: There were no complications.  ENDOSCOPIC IMPRESSION: 1.   Moderate diverticulosis was noted in the  left colon 2.   The colon was otherwise normal  RECOMMENDATIONS: 1. Continue current colorectal screening recommendations with a repeat colonoscopy in 10 years.   eSigned:  Roxy Cedar, MD 02/02/2013 9:40 AM   cc: Pecola Lawless, MD and The Patient   PATIENT NAME:  Shimon, [place] MR#: [id number]

## 2013-02-02 NOTE — Patient Instructions (Addendum)
YOU HAD AN ENDOSCOPIC PROCEDURE TODAY AT THE [facility]: Refer to the procedure report that was given to you for any specific questions about what was found during the examination.  If the procedure report does not answer your questions, please call your gastroenterologist to clarify.  If you requested that your care partner not be given the details of your procedure findings, then the procedure report has been included in a sealed envelope for you to review at your convenience later.  YOU SHOULD EXPECT: Some feelings of bloating in the abdomen. Passage of more gas than usual.  Walking can help get rid of the air that was put into your GI tract during the procedure and reduce the bloating. If you had a lower endoscopy (such as a colonoscopy or flexible sigmoidoscopy) you may notice spotting of blood in your stool or on the toilet paper. If you underwent a bowel prep for your procedure, then you may not have a normal bowel movement for a few days.  DIET: Your first meal following the procedure should be a light meal and then it is ok to progress to your normal diet.  A half-sandwich or bowl of soup is an example of a good first meal.  Heavy or fried foods are harder to digest and may make you feel nauseous or bloated.  Likewise meals heavy in dairy and vegetables can cause extra gas to form and this can also increase the bloating.  Drink plenty of fluids but you should avoid alcoholic beverages for 24 hours.  ACTIVITY: Your care partner should take you home directly after the procedure.  You should plan to take it easy, moving slowly for the rest of the day.  You can resume normal activity the day after the procedure however you should NOT DRIVE or use heavy machinery for 24 hours (because of the sedation medicines used during the test).    SYMPTOMS TO REPORT IMMEDIATELY: A gastroenterologist can be reached at any hour.  During normal business hours, 8:30 AM to 5:00 PM Monday through Friday,  call [phone].  After hours and on weekends, please call the GI answering service at [phone] who will take a message and have the physician on call contact you.   Following lower endoscopy (colonoscopy or flexible sigmoidoscopy):  Excessive amounts of blood in the stool  Significant tenderness or worsening of abdominal pains  Swelling of the abdomen that is new, acute  Fever of 100F or higher    FOLLOW UP: If any biopsies were taken you will be contacted by phone or by letter within the next 1-3 weeks.  Call your gastroenterologist if you have not heard about the biopsies in 3 weeks.  Our staff will call the home number listed on your records the next business day following your procedure to check on you and address any questions or concerns that you may have at that time regarding the information given to you following your procedure. This is a courtesy call and so if there is no answer at the home number and we have not heard from you through the emergency physician on call, we will assume that you have returned to your regular daily activities without incident.  SIGNATURES/CONFIDENTIALITY: You and/or your care partner have signed paperwork which will be entered into your electronic medical record.  These signatures attest to the fact that that the information above on your After Visit Summary has been reviewed and is understood.  Full responsibility of the confidentiality   of this discharge information lies with you and/or your care-partner.   Handouts were given to your care partner on diverticulosis and a high fiber diet. You may resume your current medications today. Please call if any questions or concerns.  

## 2013-02-02 NOTE — Progress Notes (Signed)
Procedure ends, to recovery, report given and VSS. 

## 2013-02-02 NOTE — Progress Notes (Signed)
NO complaints noted in the recovery room. Edwin [name]

## 2013-02-03 ENCOUNTER — Telehealth: Payer: Self-pay

## 2013-02-03 DIAGNOSIS — Z1211 Encounter for screening for malignant neoplasm of colon: Secondary | ICD-10-CM | POA: Diagnosis not present

## 2013-02-03 DIAGNOSIS — E669 Obesity, unspecified: Secondary | ICD-10-CM | POA: Diagnosis not present

## 2013-02-03 DIAGNOSIS — I10 Essential (primary) hypertension: Secondary | ICD-10-CM | POA: Diagnosis not present

## 2013-02-03 NOTE — Telephone Encounter (Signed)
  Follow up Call-  Call back number 02/02/2013  Post procedure Call Back phone  # [phone]  Permission to leave phone message Yes     Patient questions:  Do you have a fever, pain , or abdominal swelling? no Pain Score  0 *  Have you tolerated food without any problems? yes  Have you been able to return to your normal activities? yes  Do you have any questions about your discharge instructions: Diet   no Medications  no Follow up visit  no  Do you have questions or concerns about your Care? no  Actions: * If pain score is 4 or above: .

## 2013-03-12 ENCOUNTER — Other Ambulatory Visit: Payer: Self-pay

## 2013-04-01 ENCOUNTER — Other Ambulatory Visit: Payer: Self-pay | Admitting: Internal Medicine

## 2013-04-01 NOTE — Telephone Encounter (Signed)
Crestor refilled per protocol.

## 2013-05-20 ENCOUNTER — Telehealth: Payer: Self-pay

## 2013-05-20 DIAGNOSIS — Z0000 Encounter for general adult medical examination without abnormal findings: Secondary | ICD-10-CM

## 2013-05-20 DIAGNOSIS — E785 Hyperlipidemia, unspecified: Secondary | ICD-10-CM

## 2013-05-20 NOTE — Telephone Encounter (Signed)
Left message for call back Non-identifiable   Flu-Due Tdap-05/11/10 PNA-Due Shingles-12/23/07 CCS-02/02/13-normal PSA-05/11/10-0.88

## 2013-05-21 NOTE — Telephone Encounter (Addendum)
Patient left vm message. Returned his call--LM for return call  Medication List and allergies:  Reviewed and updated  90 day supply/mail order: na Local prescriptions: [facility]  Immunizations due: admin flu vaccine at appt  A/P:   No changes to [place] or PSH or personal hx Tdap--05/2010 [facility]CS--01/2013--Dr Perry--neg--next due 2024 PSA--1./2012--0.88 Shingles--12/2007 Lab orders entered and appt made  To Discuss with Provider: Would like PNA vaccine but not today

## 2013-05-22 ENCOUNTER — Other Ambulatory Visit (INDEPENDENT_AMBULATORY_CARE_PROVIDER_SITE_OTHER): Payer: Medicare Other

## 2013-05-22 ENCOUNTER — Ambulatory Visit (INDEPENDENT_AMBULATORY_CARE_PROVIDER_SITE_OTHER): Payer: Medicare Other | Admitting: Internal Medicine

## 2013-05-22 ENCOUNTER — Encounter: Payer: Self-pay | Admitting: Internal Medicine

## 2013-05-22 VITALS — BP 147/76 | HR 60 | Temp 98.3°F | Resp 12 | Ht 68.5 in | Wt 262.4 lb

## 2013-05-22 DIAGNOSIS — Z0000 Encounter for general adult medical examination without abnormal findings: Secondary | ICD-10-CM

## 2013-05-22 DIAGNOSIS — Z23 Encounter for immunization: Secondary | ICD-10-CM | POA: Diagnosis not present

## 2013-05-22 DIAGNOSIS — Z860100 Personal history of colon polyps, unspecified: Secondary | ICD-10-CM

## 2013-05-22 DIAGNOSIS — E785 Hyperlipidemia, unspecified: Secondary | ICD-10-CM | POA: Diagnosis not present

## 2013-05-22 DIAGNOSIS — I10 Essential (primary) hypertension: Secondary | ICD-10-CM | POA: Diagnosis not present

## 2013-05-22 DIAGNOSIS — Z87898 Personal history of other specified conditions: Secondary | ICD-10-CM | POA: Diagnosis not present

## 2013-05-22 LAB — HEPATIC FUNCTION PANEL
ALT: 27 U/L (ref 0–53)
AST: 20 U/L (ref 0–37)
Albumin: 3.9 g/dL (ref 3.5–5.2)
Alkaline Phosphatase: 33 U/L — ABNORMAL LOW (ref 39–117)
BILIRUBIN DIRECT: 0.1 mg/dL (ref 0.0–0.3)
TOTAL PROTEIN: 6.9 g/dL (ref 6.0–8.3)
Total Bilirubin: 0.8 mg/dL (ref 0.3–1.2)

## 2013-05-22 LAB — CBC WITH DIFFERENTIAL/PLATELET
BASOS ABS: 0 10*3/uL (ref 0.0–0.1)
BASOS PCT: 0.7 % (ref 0.0–3.0)
EOS PCT: 2.8 % (ref 0.0–5.0)
Eosinophils Absolute: 0.2 10*3/uL (ref 0.0–0.7)
HEMATOCRIT: 43 % (ref 39.0–52.0)
Hemoglobin: 15.1 g/dL (ref 13.0–17.0)
LYMPHS ABS: 1.2 10*3/uL (ref 0.7–4.0)
Lymphocytes Relative: 21.7 % (ref 12.0–46.0)
MCHC: 35 g/dL (ref 30.0–36.0)
MCV: 95.4 fl (ref 78.0–100.0)
Monocytes Absolute: 0.5 10*3/uL (ref 0.1–1.0)
Monocytes Relative: 9.6 % (ref 3.0–12.0)
NEUTROS ABS: 3.7 10*3/uL (ref 1.4–7.7)
Neutrophils Relative %: 65.2 % (ref 43.0–77.0)
Platelets: 197 10*3/uL (ref 150.0–400.0)
RBC: 4.51 Mil/uL (ref 4.22–5.81)
RDW: 12.7 % (ref 11.5–14.6)
WBC: 5.7 10*3/uL (ref 4.5–10.5)

## 2013-05-22 LAB — BASIC METABOLIC PANEL
BUN: 13 mg/dL (ref 6–23)
CALCIUM: 8.9 mg/dL (ref 8.4–10.5)
CO2: 28 mEq/L (ref 19–32)
Chloride: 106 mEq/L (ref 96–112)
Creatinine, Ser: 1 mg/dL (ref 0.4–1.5)
GFR: 80.53 mL/min (ref >60.00)
GLUCOSE: 102 mg/dL — AB (ref 70–99)
POTASSIUM: 4.1 meq/L (ref 3.5–5.1)
Sodium: 139 mEq/L (ref 135–145)

## 2013-05-22 LAB — LIPID PANEL
CHOL/HDL RATIO: 4
Cholesterol: 140 mg/dL (ref 0–200)
HDL: 37.8 mg/dL — AB (ref >39.00)
Triglycerides: 227 mg/dL — ABNORMAL HIGH (ref 0.0–149.0)
VLDL: 45.4 mg/dL — AB (ref 0.0–40.0)

## 2013-05-22 LAB — TSH: TSH: 2.39 u[IU]/mL (ref 0.35–5.50)

## 2013-05-22 LAB — LDL CHOLESTEROL, DIRECT: Direct LDL: 71 mg/dL

## 2013-05-22 NOTE — Progress Notes (Signed)
Pre visit review using our clinic review tool, if applicable. No additional management support is needed unless otherwise documented below in the visit note. 

## 2013-05-22 NOTE — Patient Instructions (Signed)
Your next office appointment will be determined based upon review of your pending labs . Those instructions will be transmitted to you through My Chart . Followup as needed for your this acute issue. Please report any significant change in your symptoms.Go to Web M.D. for information on Carpal Tunnel Syndrome. If symptoms persist or progress despite wrist splint; nerve conduction/EMG studies would be indicated. If these were abnormal, Hand Surgery referral would be indicated. Cardiovascular exercise, this can be as simple a program as walking, is recommended 30-45 minutes 3-4 times per week. If you're not exercising you should take 6-8 weeks to build up to this level.

## 2013-05-22 NOTE — Progress Notes (Signed)
Subjective:    Patient ID: Edwin [name], male    DOB: 11-Jun-1945, 68 y.o.   MRN: [id number]  HPI  Medicare Wellness Visit: Psychosocial and medical history were reviewed as required by Medicare (history related to abuse, antisocial behavior , firearm risk). Social history: Caffeine: 4 glasses tea or > , Alcohol:4 drinks/ week  , Tobacco YIR:SWNIO Exercise:see below Personal safety/fall risk:no Limitations of activities of daily living:no Seatbelt/ smoke alarm use:yes Healthcare Power of Attorney/Living Will status: in place Ophthalmologic exam status:UTD Hearing evaluation status:not current Orientation: Oriented X 3 Memory and recall: good Math testing: good Depression/anxiety assessment: no Foreign travel history:[place] 2010 Immunization status for influenza/pneumonia/ shingles /tetanus: Flu today Transfusion history:no Preventive health care maintenance status: Colonoscopy as per protocol/standard care:UTD Dental care:every 6 mos Chart reviewed and updated. Active issues reviewed and addressed as documented below.    Review of Systems He has intermittent R handnumbness & tingling @ night & in am upon awakening for 6 mos. A heart healthy diet is followed; exercise minimal due to foot issues. Family history is negative for premature coronary disease. Advanced cholesterol testing reveals  LDL goal is less than 100 ; ideally < 70 . There is medication compliance with the statin.  Low dose ASA taken Specifically denied are  chest pain, palpitations, dyspnea, or claudication.  Significant abdominal symptoms, memory deficit, or myalgias not present.     Objective:   Physical Exam  Gen.: Healthy and well-nourished in appearance. Alert, appropriate and cooperative throughout exam.Appears younger than stated age  Head: Normocephalic without obvious abnormalities Eyes: No corneal or conjunctival inflammation noted. Pupils equal round reactive to light and accommodation.  Extraocular motion intact.  Ears: External  ear exam reveals no significant lesions or deformities. Canals clear .TMs normal. Hearing is grossly normal bilaterally. Nose: External nasal exam reveals no deformity or inflammation. Nasal mucosa are pink and moist. No lesions or exudates noted.   Mouth: Oral mucosa and oropharynx reveal no lesions or exudates. Teeth in good repair. Neck: No deformities, masses, or tenderness noted. Range of motion & Thyroid normal. Lungs: Normal respiratory effort; chest expands symmetrically. Lungs are clear to auscultation without rales, wheezes, or increased work of breathing. Heart: Normal rate and rhythm. Normal S1 and S2. No gallop, click, or rub. S4 w/o murmur. Abdomen: Bowel sounds normal; abdomen soft and nontender. No masses, organomegaly or hernias noted. Genitalia: Genitalia normal except for left varices. Prostate is normal without enlargement, asymmetry, nodularity, or induration                                    Musculoskeletal/extremities: No deformity or scoliosis noted of  the thoracic or lumbar spine.  No clubbing, cyanosis, edema, or significant extremity  deformity noted. Range of motion normal .Tone & strength normal. Hand joints normal . Fingernail  health good. Able to lie down & sit up w/o help. Negative SLR bilaterally Vascular: Carotid, radial artery, dorsalis pedis and  posterior tibial pulses are full and equal. No bruits present. Neurologic: Alert and oriented x3. Deep tendon reflexes symmetrical and normal. Tinel's negative\\      Skin: Intact without suspicious lesions or rashes.Keratoses Lymph: No cervical, axillary, or inguinal lymphadenopathy present. Psych: Mood and affect are normal. Normally interactive  Assessment & Plan:  #1 Medicare Wellness Exam; criteria met ; data entered #2 Problem List/Diagnoses reviewed Plan:  Assessments  made/ Orders entered

## 2013-05-25 ENCOUNTER — Ambulatory Visit: Payer: Medicare Other

## 2013-05-25 DIAGNOSIS — R7309 Other abnormal glucose: Secondary | ICD-10-CM

## 2013-05-25 LAB — HEMOGLOBIN A1C: Hgb A1c MFr Bld: 5.8 % (ref 4.6–6.5)

## 2013-06-01 ENCOUNTER — Other Ambulatory Visit: Payer: Self-pay | Admitting: Internal Medicine

## 2013-06-01 NOTE — Telephone Encounter (Signed)
Metoprolol refilled per protocol. JG//CMA 

## 2013-07-13 ENCOUNTER — Other Ambulatory Visit: Payer: Self-pay | Admitting: Internal Medicine

## 2013-09-16 DIAGNOSIS — L909 Atrophic disorder of skin, unspecified: Secondary | ICD-10-CM | POA: Diagnosis not present

## 2013-09-16 DIAGNOSIS — L908 Other atrophic disorders of skin: Secondary | ICD-10-CM | POA: Diagnosis not present

## 2013-09-16 DIAGNOSIS — D239 Other benign neoplasm of skin, unspecified: Secondary | ICD-10-CM | POA: Diagnosis not present

## 2013-09-16 DIAGNOSIS — L820 Inflamed seborrheic keratosis: Secondary | ICD-10-CM | POA: Diagnosis not present

## 2013-09-16 DIAGNOSIS — L821 Other seborrheic keratosis: Secondary | ICD-10-CM | POA: Diagnosis not present

## 2013-09-16 DIAGNOSIS — L918 Other hypertrophic disorders of the skin: Secondary | ICD-10-CM | POA: Diagnosis not present

## 2013-09-16 DIAGNOSIS — D1801 Hemangioma of skin and subcutaneous tissue: Secondary | ICD-10-CM | POA: Diagnosis not present

## 2013-09-16 DIAGNOSIS — L570 Actinic keratosis: Secondary | ICD-10-CM | POA: Diagnosis not present

## 2013-09-21 ENCOUNTER — Ambulatory Visit (INDEPENDENT_AMBULATORY_CARE_PROVIDER_SITE_OTHER): Payer: Medicare Other | Admitting: Family Medicine

## 2013-09-21 ENCOUNTER — Encounter: Payer: Self-pay | Admitting: Family Medicine

## 2013-09-21 ENCOUNTER — Other Ambulatory Visit (INDEPENDENT_AMBULATORY_CARE_PROVIDER_SITE_OTHER): Payer: Medicare Other

## 2013-09-21 VITALS — BP 146/80 | HR 72 | Ht 69.0 in | Wt 264.0 lb

## 2013-09-21 DIAGNOSIS — M7660 Achilles tendinitis, unspecified leg: Secondary | ICD-10-CM

## 2013-09-21 DIAGNOSIS — M6788 Other specified disorders of synovium and tendon, other site: Secondary | ICD-10-CM

## 2013-09-21 MED ORDER — NITROGLYCERIN 0.2 MG/HR TD PT24: MEDICATED_PATCH | TRANSDERMAL | Status: DC

## 2013-09-21 NOTE — Patient Instructions (Signed)
Good to see you Wear heel lifts with your shoes.  Ice bath 20 minutes at night Continue vitamin D  Exercises 3-4 times a week.   Nitroglycerin Protocol   Apply 1/4 nitroglycerin patch to affected area daily.  Change position of patch within the affected area every 24 hours.  You may experience a headache during the first 1-2 weeks of using the patch, these should subside.  If you experience headaches after beginning nitroglycerin patch treatment, you may take your preferred over the counter pain reliever.  Another side effect of the nitroglycerin patch is skin irritation or rash related to patch adhesive.  Please notify our office if you develop more severe headaches or rash, and stop the patch.  Tendon healing with nitroglycerin patch may require 12 to 24 weeks depending on the extent of injury.  Men should not use if taking Viagra, Cialis, or Levitra.   Do not use if you have migraines or rosacea.   Come back in 3 weeks.

## 2013-09-21 NOTE — Assessment & Plan Note (Signed)
Patient is having re\re exacerbation of the underlying problem. Patient does have calcific changes of the Achilles tendinosis. Patient will be started on a nitroglycerin patches. Patient was warned of potential side effects the patient is in fairly good health I think he will be fine. Patient never was able to return to sports which is tennis and elected to do that regularly. Patient will try these interventions of exercises, catching, and icing and come back in 3 weeks.  Spent greater than 25 minutes with patient face-to-face and had greater than 50% of counseling including as described above in assessment and plan.

## 2013-09-21 NOTE — Progress Notes (Signed)
CC: Right heel pain follow up  HPI: Patient was seen previously and was diagnosed with Achilles tendinosis. Patient was given home exercises as well as an icing program and states that he was doing significantly better for some time. Patient though never returned to playing tennis secondary to pain that he continue to have. Patient has stopped doing the exercises and has noticed a significant increase in pain immediately again. Patient states that even walking now causes some discomfort. Worse after sitting for a long amount of time. Denies any tearing sensation in denies any radiation of the pain. Patient like to become more active but is having difficulty secondary to this pain.  Past medical, surgical, family and social history reviewed. Medications reviewed all in the electronic medical record.   Review of Systems: No headache, visual changes, nausea, vomiting, diarrhea, constipation, dizziness, abdominal pain, skin rash, fevers, chills, night sweats, weight loss, swollen lymph nodes, body aches, joint swelling, muscle aches, chest pain, shortness of breath, mood changes.   Objective:    Blood pressure 146/80, pulse 72, height 5\' 9"  (1.753 m), weight 264 lb (119.75 kg), SpO2 96.00%.   General: No apparent distress alert and oriented x3 mood and affect normal, dressed appropriately.  HEENT: Pupils equal, extraocular movements intact Respiratory: Patient's speak in full sentences and does not appear short of breath Cardiovascular: No lower extremity edema, non tender, no erythema Skin: Warm dry intact with no signs of infection or rash on extremities or on axial skeleton. Abdomen: Soft nontender Neuro: Cranial nerves II through XII are intact, neurovascularly intact in all extremities with 2+ DTRs and 2+ pulses. Lymph: No lymphadenopathy of posterior or anterior cervical chain or axillae bilaterally.  Gait normal with good balance and coordination.  MSK: Non tender with full range of motion  and good stability and symmetric strength and tone of shoulders, elbows, wrist, hip, knee and  bilaterally.  Ankle: Right No visible erythema or swelling. Patient though does have a nodule on the posterior aspect of his calcaneus at the insertion of the Achilles. This is a Haglund's nodule.  Patient is tender to palpation in this area but no swelling appreciated. This nodule seemed to extend approximately 2 cm from the insertion of the Achilles this is worse than previous exam. Range of motion is full in all directions. Strength is 5/5 in all directions. Stable lateral and medial ligaments; squeeze test and kleiger test unremarkable; Talar dome nontender; No pain at base of 5th MT; No tenderness over cuboid; No tenderness over N spot or navicular prominence No tenderness on posterior aspects of lateral and medial malleolus No sign of peroneal tendon subluxations or tenderness to palpation Negative tarsal tunnel tinel's Able to walk 4 steps.  MSK US performed of: Right ankle This study was ordered, performed, and interpreted by Charlann Boxer D.O.  Foot/Ankle:   All structures visualized.   Talar dome unremarkable  Ankle mortise without effusion. Peroneus longus and brevis tendons unremarkable on long and transverse views without sheath effusions. Posterior tibialis does have some mild hypoechoic changes surrounding but no tear appreciated.  Flexor hallucis longus, and flexor digitorum longus tendons unremarkable on long and transverse views without sheath effusions. Achilles tendon visualized.. There is some hypoechoic changes mostly at insertion.  Patient does have some mild neovascularization. No true tear appreciated. Patient is having some calcium deposits near the insertion. No significant change from previous ultrasound  Anterior Talofibular Ligament and Calcaneofibular Ligaments unremarkable and intact. Deltoid Ligament unremarkable and intact. Plantar fascia  intact and without effusion,  normal thickness. No increased doppler signal, cap sign, or thickening of tibial cortex. Power doppler signal normal.  IMPRESSION: Achilles tendinosis with  calcific changes.    Impression and Recommendations:     This case required medical decision making of moderate complexity.

## 2013-10-12 ENCOUNTER — Ambulatory Visit (INDEPENDENT_AMBULATORY_CARE_PROVIDER_SITE_OTHER): Payer: Medicare Other | Admitting: Family Medicine

## 2013-10-12 ENCOUNTER — Encounter: Payer: Self-pay | Admitting: Family Medicine

## 2013-10-12 VITALS — BP 142/86 | HR 92 | Ht 69.0 in | Wt 267.0 lb

## 2013-10-12 DIAGNOSIS — M7660 Achilles tendinitis, unspecified leg: Secondary | ICD-10-CM | POA: Diagnosis not present

## 2013-10-12 DIAGNOSIS — M6788 Other specified disorders of synovium and tendon, other site: Secondary | ICD-10-CM

## 2013-10-12 NOTE — Progress Notes (Signed)
CC: Right heel pain follow up  HPI: Patient was seen previously and was diagnosed with Achilles tendinosis. Patient is to wear heel lifts, do home exercise program as well as icing protocol and nitroglycerin patch. Patient did not get a nitroglycerin patch a regular basis and was not do any exercises he is out-of-town for vacation. Patient states he is only approximately 20% better. Patient denies any side effects to the nitroglycerin when he was using it. Denies any new symptoms but  Past medical, surgical, family and social history reviewed. Medications reviewed all in the electronic medical record.   Review of Systems: No headache, visual changes, nausea, vomiting, diarrhea, constipation, dizziness, abdominal pain, skin rash, fevers, chills, night sweats, weight loss, swollen lymph nodes, body aches, joint swelling, muscle aches, chest pain, shortness of breath, mood changes.   Objective:    Blood pressure 142/86, pulse 92, height 5\' 9"  (1.753 m), weight 267 lb (121.11 kg), SpO2 96.00%.   General: No apparent distress alert and oriented x3 mood and affect normal, dressed appropriately.  HEENT: Pupils equal, extraocular movements intact Respiratory: Patient's speak in full sentences and does not appear short of breath Cardiovascular: No lower extremity edema, non tender, no erythema Skin: Warm dry intact with no signs of infection or rash on extremities or on axial skeleton. Abdomen: Soft nontender Neuro: Cranial nerves II through XII are intact, neurovascularly intact in all extremities with 2+ DTRs and 2+ pulses. Lymph: No lymphadenopathy of posterior or anterior cervical chain or axillae bilaterally.  Gait normal with good balance and coordination.  MSK: Non tender with full range of motion and good stability and symmetric strength and tone of shoulders, elbows, wrist, hip, knee and  bilaterally.  Ankle: Right No visible erythema or swelling. Patient though does have a Haglund's nodule.   Patient is tender to palpation in this area but no swelling appreciated.  Range of motion is full in all directions. Strength is 5/5 in all directions. Stable lateral and medial ligaments; squeeze test and kleiger test unremarkable; Talar dome nontender; No pain at base of 5th MT; No tenderness over cuboid; No tenderness over N spot or navicular prominence No tenderness on posterior aspects of lateral and medial malleolus No sign of peroneal tendon subluxations or tenderness to palpation Negative tarsal tunnel tinel's Able to walk 4 steps. Contralateral ankle unremarkable on exam unchanged from previous exam.     Impression and Recommendations:     This case required medical decision making of moderate complexity.

## 2013-10-12 NOTE — Assessment & Plan Note (Signed)
Patient has not done to conservative therapy in great detail at this time. We discussed the possibility of formal physical therapy which she declined. Encourage him to do the home exercises on a regular basis we showed improper technique again. We discussed the importance of icing protocol as well as a nitroglycerin. Patient will try this for a regular time. Patient was given another brace that was fitted by me today as well which include a pneumatic compression sleeve. Patient will come back again in 4 weeks for further evaluation. Spent greater than 25 minutes with patient face-to-face and had greater than 50% of counseling including as described above in assessment and plan.

## 2013-10-12 NOTE — Patient Instructions (Signed)
Good to see you Try the patch at half the dose. Leave it on for 24 hours.  Continue the ice Try the new brace as well Step exercises most important! See me again in 4 weeks.

## 2013-11-10 ENCOUNTER — Ambulatory Visit (INDEPENDENT_AMBULATORY_CARE_PROVIDER_SITE_OTHER): Payer: Medicare Other | Admitting: Family Medicine

## 2013-11-10 ENCOUNTER — Encounter: Payer: Self-pay | Admitting: Family Medicine

## 2013-11-10 VITALS — BP 148/86 | HR 72 | Ht 69.0 in | Wt 261.0 lb

## 2013-11-10 DIAGNOSIS — M7661 Achilles tendinitis, right leg: Secondary | ICD-10-CM

## 2013-11-10 DIAGNOSIS — M7660 Achilles tendinitis, unspecified leg: Secondary | ICD-10-CM | POA: Diagnosis not present

## 2013-11-10 NOTE — Assessment & Plan Note (Signed)
Patient continues to improve but I do think that he does have more of a chronic furcation secondary to the shoes he is having at this point we discussed more rounded he'll do could be more beneficial. We discussed different shoes it to be beneficial. Encourage him to do the icing that we'll not continued the nitroglycerin. Patient will continue to do the exercises 3 times a week. Patient will come back and see me again in 6-8 weeks for further followup.  Spent greater than 25 minutes with patient face-to-face and had greater than 50% of counseling including as described above in assessment and plan.

## 2013-11-10 NOTE — Progress Notes (Signed)
CC: Right heel pain follow up  HPI: Patient was seen previously and was diagnosed with Achilles tendinosis.patient has only been doing the exercises seldomly. Patient has stopped nitroglycerin patch, has stopped the icing, did not get new shoes or the over-the-counter orthotics. Patient has noticed though that if he wears certain shoes certain times is worse. States that he continues to improve by another 20-25%. No new symptoms.  Past medical, surgical, family and social history reviewed. Medications reviewed all in the electronic medical record.   Review of Systems: No headache, visual changes, nausea, vomiting, diarrhea, constipation, dizziness, abdominal pain, skin rash, fevers, chills, night sweats, weight loss, swollen lymph nodes, body aches, joint swelling, muscle aches, chest pain, shortness of breath, mood changes.   Objective:    Blood pressure 148/86, pulse 72, height 5\' 9"  (1.753 m), weight 261 lb (118.389 kg), SpO2 95.00%.   General: No apparent distress alert and oriented x3 mood and affect normal, dressed appropriately.  HEENT: Pupils equal, extraocular movements intact Respiratory: Patient's speak in full sentences and does not appear short of breath Cardiovascular: No lower extremity edema, non tender, no erythema Skin: Warm dry intact with no signs of infection or rash on extremities or on axial skeleton. Abdomen: Soft nontender Neuro: Cranial nerves II through XII are intact, neurovascularly intact in all extremities with 2+ DTRs and 2+ pulses. Lymph: No lymphadenopathy of posterior or anterior cervical chain or axillae bilaterally.  Gait normal with good balance and coordination.  MSK: Non tender with full range of motion and good stability and symmetric strength and tone of shoulders, elbows, wrist, hip, knee and  bilaterally.  Ankle: Right No visible erythema or swelling. Patient though does have a Haglund's nodule.  Patient is  Less tender to palpation than previous  episode.  Range of motion is full in all directions. Strength is 5/5 in all directions. Stable lateral and medial ligaments; squeeze test and kleiger test unremarkable; Talar dome nontender; No pain at base of 5th MT; No tenderness over cuboid; No tenderness over N spot or navicular prominence No tenderness on posterior aspects of lateral and medial malleolus No sign of peroneal tendon subluxations or tenderness to palpation Negative tarsal tunnel tinel's Able to walk 4 steps. Contralateral ankle unremarkable on exam unchanged from previous exam.   Impression and Recommendations:     This case required medical decision making of moderate complexity.

## 2013-11-10 NOTE — Patient Instructions (Signed)
Good to see you Stop the nitro New balance >700, look in the back room first.  Look for walking shoes or or cross training.  Consider rocker bottom foot.  Ice still at the end of a long day.  Come back in 6-8 weeks if not perfect.

## 2013-11-30 ENCOUNTER — Other Ambulatory Visit: Payer: Self-pay | Admitting: Internal Medicine

## 2013-12-29 ENCOUNTER — Encounter: Payer: Self-pay | Admitting: Family Medicine

## 2013-12-29 ENCOUNTER — Ambulatory Visit (INDEPENDENT_AMBULATORY_CARE_PROVIDER_SITE_OTHER): Payer: Medicare Other | Admitting: Family Medicine

## 2013-12-29 VITALS — BP 132/70 | HR 70 | Ht 69.0 in | Wt 251.0 lb

## 2013-12-29 DIAGNOSIS — M7660 Achilles tendinitis, unspecified leg: Secondary | ICD-10-CM

## 2013-12-29 DIAGNOSIS — M7661 Achilles tendinitis, right leg: Secondary | ICD-10-CM

## 2013-12-29 NOTE — Patient Instructions (Signed)
Good to see you.  I will get you in with physical therapy.  Contiue with the ice Try the pennsaid twice daily. If you like it call me and I will send in prescription.  2 gram fish oil.  Turmeric 500mg  twice daily. Will decrease inflammation.  Continue the vitamin D.  Come back in 4 weeks.

## 2013-12-29 NOTE — Progress Notes (Signed)
CC: Right heel pain follow up  HPI: Patient is returning for his Achilles tendinosis. Patient was noncompliant after last visit. Patient was encouraged to do exercises, icing. Patient states he is about 5% better. Patient has not been doing icing and has not been doing the exercises. Patient remains doing his own activity. Patient states he can have some days where he is pain-free and other days when he cannot walk. Patient states that there is no rhyme or reason to this. Patient denies any nighttime awakening. Able to do most daily activities without any significant discomfort.   Past medical, surgical, family and social history reviewed. Medications reviewed all in the electronic medical record.   Review of Systems: No headache, visual changes, nausea, vomiting, diarrhea, constipation, dizziness, abdominal pain, skin rash, fevers, chills, night sweats, weight loss, swollen lymph nodes, body aches, joint swelling, muscle aches, chest pain, shortness of breath, mood changes.   Objective:    Blood pressure 132/70, pulse 70, height 5\' 9"  (1.753 m), weight 251 lb (113.853 kg), SpO2 95.00%.   General: No apparent distress alert and oriented x3 mood and affect normal, dressed appropriately.  HEENT: Pupils equal, extraocular movements intact Respiratory: Patient's speak in full sentences and does not appear short of breath Cardiovascular: No lower extremity edema, non tender, no erythema Skin: Warm dry intact with no signs of infection or rash on extremities or on axial skeleton. Abdomen: Soft nontender Neuro: Cranial nerves II through XII are intact, neurovascularly intact in all extremities with 2+ DTRs and 2+ pulses. Lymph: No lymphadenopathy of posterior or anterior cervical chain or axillae bilaterally.  Gait normal with good balance and coordination.  MSK: Non tender with full range of motion and good stability and symmetric strength and tone of shoulders, elbows, wrist, hip, knee and   bilaterally.  Ankle: Right No visible erythema or swelling. Patient though does have a Haglund's nodule.  Patient is  Less tender to palpation than previous episode.  Range of motion is full in all directions. Strength is 5/5 in all directions. Stable lateral and medial ligaments; squeeze test and kleiger test unremarkable; Talar dome nontender; No pain at base of 5th MT; No tenderness over cuboid; No tenderness over N spot or navicular prominence No tenderness on posterior aspects of lateral and medial malleolus No sign of peroneal tendon subluxations or tenderness to palpation Negative tarsal tunnel tinel's Able to walk 4 steps. Contralateral ankle unremarkable Change from previous exam  Impression and Recommendations:     This case required medical decision making of moderate complexity.

## 2013-12-29 NOTE — Assessment & Plan Note (Signed)
Patient has been fairly noncompliant. Patient was wearing a heel lift. Patient has not been doing the exercises we will send him to formal physical therapy for a more structured routine. We discussed topical anti-inflammatories which patient will try. Her warned about potential side effects. We discussed continuing the icing areolar basis the patient states he'll attempt to do better. Patient will come back and see me again in 3-4 weeks for further evaluation and treatment.  Spent greater than 25 minutes with patient face-to-face and had greater than 50% of counseling including as described above in assessment and plan.

## 2013-12-31 ENCOUNTER — Telehealth: Payer: Self-pay

## 2013-12-31 MED ORDER — DICLOFENAC SODIUM 2 % TD SOLN: TRANSDERMAL | Status: DC

## 2013-12-31 NOTE — Telephone Encounter (Signed)
Refill done.  

## 2013-12-31 NOTE — Telephone Encounter (Signed)
Patient called and would like to inform MD that pennsaid samples worked. He would like to request and Rx for this. Thanks

## 2014-01-15 DIAGNOSIS — M7660 Achilles tendinitis, unspecified leg: Secondary | ICD-10-CM | POA: Diagnosis not present

## 2014-01-20 DIAGNOSIS — M7660 Achilles tendinitis, unspecified leg: Secondary | ICD-10-CM | POA: Diagnosis not present

## 2014-01-28 DIAGNOSIS — M7660 Achilles tendinitis, unspecified leg: Secondary | ICD-10-CM | POA: Diagnosis not present

## 2014-02-02 ENCOUNTER — Ambulatory Visit (INDEPENDENT_AMBULATORY_CARE_PROVIDER_SITE_OTHER): Payer: Medicare Other | Admitting: Family Medicine

## 2014-02-02 ENCOUNTER — Encounter: Payer: Self-pay | Admitting: Family Medicine

## 2014-02-02 VITALS — BP 122/74 | HR 78 | Ht 69.0 in | Wt 247.0 lb

## 2014-02-02 DIAGNOSIS — M7660 Achilles tendinitis, unspecified leg: Secondary | ICD-10-CM

## 2014-02-02 DIAGNOSIS — M7661 Achilles tendinitis, right leg: Secondary | ICD-10-CM

## 2014-02-02 MED ORDER — DICLOFENAC SODIUM 2 % TD SOLN: TRANSDERMAL | Status: DC

## 2014-02-02 NOTE — Patient Instructions (Signed)
You are doing great.  Finish with physical therapy.  Ice is your friend.  Try the pennsaid twice daily and call the number.  Continue exercises 3 times a week   Continue the vitamin D as wlel.  See me again in 4-6 weeks.

## 2014-02-02 NOTE — Progress Notes (Signed)
CC: Right heel pain follow up  HPI: Patient is returning for his Achilles tendinosis. Patient is not doing significantly better but was noncompliant with home exercises. Patient at last visit was encouraged to wear the heel lift, start formal physical therapy, topical anti-inflammatories as well as icing protocol. Patient states that with physical therapy he is about 90% better. Patient states that he continues to do all the other activities and has noticed improvement. Patient is not doing a nitroglycerin patch but does find benefit for the topical anti-inflammatories but is out of the medication at this time. Patient denies any new symptoms. Patient has not played tennis yet he knows that be the true test.  Past medical, surgical, family and social history reviewed. Medications reviewed all in the electronic medical record.   Review of Systems: No headache, visual changes, nausea, vomiting, diarrhea, constipation, dizziness, abdominal pain, skin rash, fevers, chills, night sweats, weight loss, swollen lymph nodes, body aches, joint swelling, muscle aches, chest pain, shortness of breath, mood changes.   Objective:    Blood pressure 122/74, pulse 78, height 5\' 9"  (1.753 m), weight 247 lb (112.038 kg), SpO2 96.00%.   General: No apparent distress alert and oriented x3 mood and affect normal, dressed appropriately.  HEENT: Pupils equal, extraocular movements intact Respiratory: Patient's speak in full sentences and does not appear short of breath Cardiovascular: No lower extremity edema, non tender, no erythema Skin: Warm dry intact with no signs of infection or rash on extremities or on axial skeleton. Abdomen: Soft nontender Neuro: Cranial nerves II through XII are intact, neurovascularly intact in all extremities with 2+ DTRs and 2+ pulses. Lymph: No lymphadenopathy of posterior or anterior cervical chain or axillae bilaterally.  Gait normal with good balance and coordination.  MSK: Non tender  with full range of motion and good stability and symmetric strength and tone of shoulders, elbows, wrist, hip, knee and  bilaterally.  Ankle: Right No visible erythema or swelling. Patient though does have a Haglund's nodule.  Patient is  significantly less tender to palpation than previous episode.  Range of motion is full in all directions. Strength is 5/5 in all directions. Stable lateral and medial ligaments; squeeze test and kleiger test unremarkable; Talar dome nontender; No pain at base of 5th MT; No tenderness over cuboid; No tenderness over N spot or navicular prominence No tenderness on posterior aspects of lateral and medial malleolus No sign of peroneal tendon subluxations or tenderness to palpation Negative tarsal tunnel tinel's Able to walk 4 steps. Contralateral ankle unremarkable Change from previous exam  MSK US performed of: Right This study was ordered, performed, and interpreted by Charlann Boxer D.O.  Foot/Ankle:   All structures visualized.   Talar dome unremarkable  Ankle mortise without effusion. Peroneus longus and brevis tendons unremarkable on long and transverse views without sheath effusions. Posterior tibialis, flexor hallucis longus, and flexor digitorum longus tendons unremarkable on long and transverse views without sheath effusions. Achilles tendon no longer has a large nodule but only has mild calcific changes. No hypoechoic changes. Anterior Talofibular Ligament and Calcaneofibular Ligaments unremarkable and intact. Deltoid Ligament unremarkable and intact. Plantar fascia intact and without effusion, normal thickness. No increased doppler signal, cap sign, or thickening of tibial cortex. Power doppler signal normal.  IMPRESSION: Improved Achilles tendinosis.    Impression and Recommendations:     This case required medical decision making of moderate complexity.

## 2014-02-02 NOTE — Assessment & Plan Note (Signed)
Patient is improving at this time. Patient was given phase II exercises and showed proper technique of some strengthening exercises. We discussed once again with proper shoes to wear. We discussed an icing regimen I will be helpful as well as finishing up her formal physical therapy. Patient was given a prescription for the topical anti-inflammatory. Patient will follow up in 4-6 weeks if not completely pain-free.  Spent greater than 25 minutes with patient face-to-face and had greater than 50% of counseling including as described above in assessment and plan.

## 2014-02-04 DIAGNOSIS — M7661 Achilles tendinitis, right leg: Secondary | ICD-10-CM | POA: Diagnosis not present

## 2014-03-02 ENCOUNTER — Ambulatory Visit: Payer: Medicare Other | Admitting: Internal Medicine

## 2014-03-02 ENCOUNTER — Encounter: Payer: Self-pay | Admitting: Family Medicine

## 2014-03-02 ENCOUNTER — Ambulatory Visit (INDEPENDENT_AMBULATORY_CARE_PROVIDER_SITE_OTHER): Payer: Medicare Other | Admitting: Family Medicine

## 2014-03-02 VITALS — BP 132/76 | HR 61 | Ht 69.0 in | Wt 245.0 lb

## 2014-03-02 DIAGNOSIS — M7661 Achilles tendinitis, right leg: Secondary | ICD-10-CM | POA: Diagnosis not present

## 2014-03-02 DIAGNOSIS — Z23 Encounter for immunization: Secondary | ICD-10-CM

## 2014-03-02 NOTE — Patient Instructions (Signed)
Good to see you Would continue the exercises and 2 times a week and then increase the drop of the heels.  Play tennis but stretches aftereward.  Do the treadmill up to 3 times a week and stretch afterward as well as ice when home.  Pennsaid as needed.  Slow and sweet on way back to everything.  See me again in 6 weeks.

## 2014-03-02 NOTE — Assessment & Plan Note (Signed)
Patient does have tendinosis but has improved significant. Encourage him to continue the topical anti-inflammatories as needed as well as an icing protocol. Lungs patient continues to do well he can follow-up on an as-needed basis. Encourage him to start increasing his activity such as tennis. If pain comes back or persists he will come back again within 6 weeks.

## 2014-03-02 NOTE — Progress Notes (Signed)
CC: Right heel pain follow up  HPI: Patient is returning for his Achilles tendinosis. Patient was seen at last visit and was fairly noncompliant with the conservative therapy. Encourage patient to finish with formal physical therapy at that time and start doing the activities more regularly. Patient states he has been doing the exercises a little more regularly and has noticed approximately 95% benefit and improve, No numbness. No radiation, has increased activity. No new symptoms.   Past medical, surgical, family and social history reviewed. Medications reviewed all in the electronic medical record.   Review of Systems: No headache, visual changes, nausea, vomiting, diarrhea, constipation, dizziness, abdominal pain, skin rash, fevers, chills, night sweats, weight loss, swollen lymph nodes, body aches, joint swelling, muscle aches, chest pain, shortness of breath, mood changes.   Objective:    Blood pressure 132/76, pulse 61, height 5\' 9"  (1.753 m), weight 245 lb (111.131 kg), SpO2 99.00%.   General: No apparent distress alert and oriented x3 mood and affect normal, dressed appropriately.  HEENT: Pupils equal, extraocular movements intact Respiratory: Patient's speak in full sentences and does not appear short of breath Cardiovascular: No lower extremity edema, non tender, no erythema Skin: Warm dry intact with no signs of infection or rash on extremities or on axial skeleton. Abdomen: Soft nontender Neuro: Cranial nerves II through XII are intact, neurovascularly intact in all extremities with 2+ DTRs and 2+ pulses. Lymph: No lymphadenopathy of posterior or anterior cervical chain or axillae bilaterally.  Gait normal with good balance and coordination.  MSK: Non tender with full range of motion and good stability and symmetric strength and tone of shoulders, elbows, wrist, hip, knee and  bilaterally.  Ankle: Right No visible erythema or swelling. Patient though does have a Haglund's nodule.   nontender on exam Range of motion is full in all directions. Strength is 5/5 in all directions. Stable lateral and medial ligaments; squeeze test and kleiger test unremarkable; Talar dome nontender; No pain at base of 5th MT; No tenderness over cuboid; No tenderness over N spot or navicular prominence No tenderness on posterior aspects of lateral and medial malleolus No sign of peroneal tendon subluxations or tenderness to palpation Negative tarsal tunnel tinel's Able to walk 4 steps. Contralateral ankle unremarkable Change from previous exam     Impression and Recommendations:     This case required medical decision making of moderate complexity.

## 2014-04-13 ENCOUNTER — Ambulatory Visit (INDEPENDENT_AMBULATORY_CARE_PROVIDER_SITE_OTHER): Payer: Medicare Other | Admitting: Family Medicine

## 2014-04-13 ENCOUNTER — Encounter: Payer: Self-pay | Admitting: Family Medicine

## 2014-04-13 VITALS — BP 138/80 | HR 66 | Ht 69.0 in | Wt 246.0 lb

## 2014-04-13 DIAGNOSIS — M7661 Achilles tendinitis, right leg: Secondary | ICD-10-CM | POA: Diagnosis not present

## 2014-04-13 NOTE — Assessment & Plan Note (Signed)
Patient is doing much better will continue with conservative therapy and can follow-up with me on an as-needed basis. Encourage him to start playing tennis on a regular basis now. he'll continue with the heel lifts in the exercises.

## 2014-04-13 NOTE — Patient Instructions (Signed)
Get back on the court Continue the heel lifts.  Zinc better then Vitamin C for colds [facility] for vitamins would be good.  With increase activity see me again in 3 weeks if still in pain.

## 2014-04-13 NOTE — Progress Notes (Signed)
CC: Right heel pain follow up  HPI: Patient is returning for his Achilles tendinosis.  Patient 6 weeks ago and was doing significantly better with the Achilles tendinosis bilaterally. Patient states he is 99% better. Patient states that he does significant amount walking he has a dull discomfort but no real pain. Patient has not started playing tennis but is working on a regular basis. Denies any numbness or tingling or any radiation of pain. Patient states overall he is fairly happy as long as he wears the proper shoes.  Past medical, surgical, family and social history reviewed. Medications reviewed all in the electronic medical record.   Review of Systems: No headache, visual changes, nausea, vomiting, diarrhea, constipation, dizziness, abdominal pain, skin rash, fevers, chills, night sweats, weight loss, swollen lymph nodes, body aches, joint swelling, muscle aches, chest pain, shortness of breath, mood changes.   Objective:    Blood pressure 138/80, pulse 66, height 5\' 9"  (1.753 m), weight 246 lb (111.585 kg), SpO2 95 %.   General: No apparent distress alert and oriented x3 mood and affect normal, dressed appropriately.  HEENT: Pupils equal, extraocular movements intact Respiratory: Patient's speak in full sentences and does not appear short of breath Cardiovascular: No lower extremity edema, non tender, no erythema Skin: Warm dry intact with no signs of infection or rash on extremities or on axial skeleton. Abdomen: Soft nontender Neuro: Cranial nerves II through XII are intact, neurovascularly intact in all extremities with 2+ DTRs and 2+ pulses. Lymph: No lymphadenopathy of posterior or anterior cervical chain or axillae bilaterally.  Gait normal with good balance and coordination.  MSK: Non tender with full range of motion and good stability and symmetric strength and tone of shoulders, elbows, wrist, hip, knee and  bilaterally.  Ankle: Right No visible erythema or swelling. Patient  though does have a Haglund's nodule.  nontender on exam Range of motion is full in all directions. Strength is 5/5 in all directions. Stable lateral and medial ligaments; squeeze test and kleiger test unremarkable; Talar dome nontender; No pain at base of 5th MT; No tenderness over cuboid; No tenderness over N spot or navicular prominence No tenderness on posterior aspects of lateral and medial malleolus No sign of peroneal tendon subluxations or tenderness to palpation Negative tarsal tunnel tinel's Able to walk 4 steps. Contralateral ankle unremarkable No change from previous exam     Impression and Recommendations:     This case required medical decision making of moderate complexity.

## 2014-04-14 ENCOUNTER — Other Ambulatory Visit: Payer: Self-pay | Admitting: *Deleted

## 2014-04-14 DIAGNOSIS — Z23 Encounter for immunization: Secondary | ICD-10-CM

## 2014-06-02 ENCOUNTER — Encounter: Payer: Self-pay | Admitting: Internal Medicine

## 2014-06-02 ENCOUNTER — Other Ambulatory Visit (INDEPENDENT_AMBULATORY_CARE_PROVIDER_SITE_OTHER): Payer: Medicare Other

## 2014-06-02 ENCOUNTER — Ambulatory Visit (INDEPENDENT_AMBULATORY_CARE_PROVIDER_SITE_OTHER): Payer: Medicare Other | Admitting: Internal Medicine

## 2014-06-02 ENCOUNTER — Other Ambulatory Visit: Payer: Self-pay | Admitting: Internal Medicine

## 2014-06-02 VITALS — BP 146/70 | HR 61 | Temp 98.0°F | Ht 69.0 in | Wt 251.2 lb

## 2014-06-02 DIAGNOSIS — G4733 Obstructive sleep apnea (adult) (pediatric): Secondary | ICD-10-CM

## 2014-06-02 DIAGNOSIS — E785 Hyperlipidemia, unspecified: Secondary | ICD-10-CM

## 2014-06-02 DIAGNOSIS — I491 Atrial premature depolarization: Secondary | ICD-10-CM

## 2014-06-02 DIAGNOSIS — R739 Hyperglycemia, unspecified: Secondary | ICD-10-CM | POA: Diagnosis not present

## 2014-06-02 DIAGNOSIS — I10 Essential (primary) hypertension: Secondary | ICD-10-CM | POA: Diagnosis not present

## 2014-06-02 DIAGNOSIS — Z860100 Personal history of colon polyps, unspecified: Secondary | ICD-10-CM

## 2014-06-02 DIAGNOSIS — R0789 Other chest pain: Secondary | ICD-10-CM

## 2014-06-02 LAB — BASIC METABOLIC PANEL
BUN: 18 mg/dL (ref 6–23)
CALCIUM: 9.1 mg/dL (ref 8.4–10.5)
CO2: 26 mEq/L (ref 19–32)
CREATININE: 0.97 mg/dL (ref 0.40–1.50)
Chloride: 105 mEq/L (ref 96–112)
GFR: 82.19 mL/min (ref >60.00)
Glucose, Bld: 113 mg/dL — ABNORMAL HIGH (ref 70–99)
Potassium: 4.4 mEq/L (ref 3.5–5.1)
SODIUM: 139 meq/L (ref 135–145)

## 2014-06-02 LAB — HEPATIC FUNCTION PANEL
ALT: 20 U/L (ref 0–53)
AST: 17 U/L (ref 0–37)
Albumin: 3.9 g/dL (ref 3.5–5.2)
Alkaline Phosphatase: 30 U/L — ABNORMAL LOW (ref 39–117)
Bilirubin, Direct: 0.1 mg/dL (ref 0.0–0.3)
Total Bilirubin: 0.5 mg/dL (ref 0.2–1.2)
Total Protein: 6.9 g/dL (ref 6.0–8.3)

## 2014-06-02 LAB — CBC WITH DIFFERENTIAL/PLATELET
BASOS PCT: 1.1 % (ref 0.0–3.0)
Basophils Absolute: 0.1 10*3/uL (ref 0.0–0.1)
EOS ABS: 0.2 10*3/uL (ref 0.0–0.7)
EOS PCT: 3.6 % (ref 0.0–5.0)
HCT: 44 % (ref 39.0–52.0)
Hemoglobin: 15.6 g/dL (ref 13.0–17.0)
Lymphocytes Relative: 27.7 % (ref 12.0–46.0)
Lymphs Abs: 1.5 10*3/uL (ref 0.7–4.0)
MCHC: 35.4 g/dL (ref 30.0–36.0)
MCV: 92.3 fl (ref 78.0–100.0)
MONO ABS: 0.6 10*3/uL (ref 0.1–1.0)
Monocytes Relative: 11 % (ref 3.0–12.0)
NEUTROS PCT: 56.6 % (ref 43.0–77.0)
Neutro Abs: 3 10*3/uL (ref 1.4–7.7)
PLATELETS: 193 10*3/uL (ref 150.0–400.0)
RBC: 4.76 Mil/uL (ref 4.22–5.81)
RDW: 12.5 % (ref 11.5–15.5)
WBC: 5.3 10*3/uL (ref 4.0–10.5)

## 2014-06-02 LAB — HEMOGLOBIN A1C: HEMOGLOBIN A1C: 6 % (ref 4.6–6.5)

## 2014-06-02 LAB — TSH: TSH: 3.41 u[IU]/mL (ref 0.35–4.50)

## 2014-06-02 NOTE — Assessment & Plan Note (Signed)
CBC

## 2014-06-02 NOTE — Progress Notes (Signed)
Pre visit review using our clinic review tool, if applicable. No additional management support is needed unless otherwise documented below in the visit note. 

## 2014-06-02 NOTE — Assessment & Plan Note (Signed)
A1c

## 2014-06-02 NOTE — Assessment & Plan Note (Signed)
Sleep Medicine referral

## 2014-06-02 NOTE — Patient Instructions (Addendum)
Your next office appointment will be determined based upon review of your pending labs &/ or x-rays. Those instructions will be transmitted to you through My Chart  OR  by mail;whichever process is your choice to receive results & recommendations . Critical values will be called    Please report any significant change in your symptoms.  Minimal Blood Pressure Goal= AVERAGE < 140/90;  Ideal is an AVERAGE < 135/85. This AVERAGE should be calculated from @ least 5-7 BP readings taken @ different times of day on different days of week. You should not respond to isolated BP readings , but rather the AVERAGE for that week .Please bring your  blood pressure cuff to office visits to verify that it is reliable.It  can also be checked against the blood pressure device at the pharmacy. Finger or wrist cuffs are not dependable; an arm cuff is.  To prevent palpitations or premature beats, avoid stimulants such as decongestants, diet pills, nicotine, or caffeine (coffee, tea, cola, or chocolate) to excess.

## 2014-06-02 NOTE — Progress Notes (Signed)
Subjective:    Patient ID: Edwin [name], male    DOB: 1946-02-07, 69 y.o.   MRN: [id number]  HPI The patient is here to assess status of active health conditions.  PMH, FH, & Social History reviewed & updated.  He has been compliant with his blood pressure medicines. He does not monitor the blood pressure at home  He is on a modified heart healthy, no added salt diet usually  He's not been exercising since the Fall because of Achilles tendon issues  He stopped his Crestor every other day several months ago. There was some question of arthralgias and also question about.literature documentation of its value.  There is some premature coronary disease in the family. Advanced lipid testing reveals his LDL goal is less than 100, ideally less than 70.  Colonoscopy is up-to-date; he's had adenomatous polyps twice. Follow-up would be in 2020.  At this time he denies any cardiopulmonary symptoms except intermittent non exertional , non radiating SS discomfort.No GI symptoms.   Review of Systems  Palpitations, tachycardia, exertional dyspnea, paroxysmal nocturnal dyspnea, claudication or edema are absent.  Unexplained weight loss, abdominal pain, significant dyspepsia, dysphagia, melena, rectal bleeding, or persistently small caliber stools are denied.     Objective:   Physical Exam  Gen.: Adequately nourished in appearance. Alert, appropriate and cooperative throughout exam. As per CDC Guidelines ,Epic documents obesity as being present . Head: Normocephalic without obvious abnormalities  Eyes: No corneal or conjunctival inflammation noted. Pupils equal round reactive to light and accommodation. Extraocular motion intact.  Ears: External  ear exam reveals no significant lesions or deformities. Canals clear .TMs normal. Hearing is grossly normal bilaterally. Nose: External nasal exam reveals no deformity or inflammation. Nasal mucosa are pink and moist. No lesions or exudates noted.    Mouth: Oral mucosa and oropharynx reveal no lesions or exudates. Teeth in good repair. Neck: No deformities, masses, or tenderness noted. Range of motion decreased. Thyroid normal. Lungs: Normal respiratory effort; chest expands symmetrically. Lungs are clear to auscultation without rales, wheezes, or increased work of breathing. Heart: Normal rate and rhythm. Normal S1 and S2. No gallop, click, or rub. No murmur. Abdomen: Protuberant.Bowel sounds normal; abdomen soft and nontender. No masses, organomegaly or hernias noted. Genitalia: Genitalia normal except for left varices. Prostate is normal without enlargement, asymmetry, nodularity, or induration                             Musculoskeletal/extremities: No deformity or scoliosis noted of  the thoracic or lumbar spine. No clubbing, cyanosis, edema, or significant extremity  deformity noted.  Range of motion normal . Tone & strength normal. Hand joints normal  Fingernail  health good. Crepitus of knees  Able to lie down & sit up w/o help.  Negative SLR bilaterally Vascular: Carotid, radial artery, dorsalis pedis and  posterior tibial pulses are full and equal. No bruits present. Neurologic: Alert and oriented x3. Deep tendon reflexes symmetrical and normal.  Gait normal      Skin: Intact without suspicious lesions or rashes. Lymph: No cervical, axillary, or inguinal lymphadenopathy present. Psych: Mood and affect are normal. Normally interactive  Assessment & Plan:  See Current Assessment & Plan in Problem List under specific Diagnosis

## 2014-06-02 NOTE — Assessment & Plan Note (Signed)
Blood pressure goals reviewed. BMET 

## 2014-06-02 NOTE — Assessment & Plan Note (Signed)
NMR Lipoprofile, LFTs, TSH  

## 2014-06-03 ENCOUNTER — Telehealth: Payer: Self-pay | Admitting: Internal Medicine

## 2014-06-03 NOTE — Telephone Encounter (Signed)
emmi emailed °

## 2014-06-04 LAB — NMR LIPOPROFILE WITH LIPIDS
CHOLESTEROL, TOTAL: 202 mg/dL — AB (ref 100–199)
HDL Particle Number: 33.6 umol/L (ref >=30.5)
HDL SIZE: 8.7 nm — AB (ref >=9.2)
HDL-C: 44 mg/dL (ref >39)
LARGE HDL: 2.5 umol/L — AB (ref >=4.8)
LDL (calc): 115 mg/dL — ABNORMAL HIGH (ref 0–99)
LDL Particle Number: 1492 nmol/L — ABNORMAL HIGH (ref <1000)
LDL SIZE: 19.8 nm (ref >=20.8)
LP-IR Score: 85 — ABNORMAL HIGH (ref <=45)
Large VLDL-P: 14.5 nmol/L — ABNORMAL HIGH (ref <=2.7)
Small LDL Particle Number: 859 nmol/L — ABNORMAL HIGH (ref <=527)
Triglycerides: 213 mg/dL — ABNORMAL HIGH (ref 0–149)
VLDL Size: 60.6 nm — ABNORMAL HIGH (ref <=46.6)

## 2014-07-08 ENCOUNTER — Ambulatory Visit (INDEPENDENT_AMBULATORY_CARE_PROVIDER_SITE_OTHER): Payer: Medicare Other | Admitting: Pulmonary Disease

## 2014-07-08 ENCOUNTER — Encounter: Payer: Self-pay | Admitting: Pulmonary Disease

## 2014-07-08 VITALS — BP 162/90 | HR 73 | Temp 98.2°F | Ht 69.0 in | Wt 254.4 lb

## 2014-07-08 DIAGNOSIS — G4733 Obstructive sleep apnea (adult) (pediatric): Secondary | ICD-10-CM | POA: Diagnosis not present

## 2014-07-08 DIAGNOSIS — Z9989 Dependence on other enabling machines and devices: Principal | ICD-10-CM

## 2014-07-08 NOTE — Progress Notes (Deleted)
   Subjective:    Patient ID: Edwin [name], male    DOB: 1945/09/22, 69 y.o.   MRN: [id number]  HPI    Review of Systems  Constitutional: Negative for fever and unexpected weight change.  HENT: Negative for congestion, dental problem, ear pain, nosebleeds, postnasal drip, rhinorrhea, sinus pressure, sneezing, sore throat and trouble swallowing.   Eyes: Negative for redness and itching.  Respiratory: Negative for cough, chest tightness, shortness of breath and wheezing.   Cardiovascular: Negative for palpitations and leg swelling.  Gastrointestinal: Negative for nausea and vomiting.  Genitourinary: Negative for dysuria.  Musculoskeletal: Positive for joint swelling and arthralgias.  Skin: Negative for rash.  Neurological: Negative for headaches.  Hematological: Does not bruise/bleed easily.  Psychiatric/Behavioral: Negative for dysphoric mood. The patient is not nervous/anxious.        Objective:   Physical Exam        Assessment & Plan:

## 2014-07-08 NOTE — Progress Notes (Signed)
Chief Complaint  Patient presents with  . SLEEP CONSULT    Referred by Dr Linna Darner. Sleep Study at [facility] x 5+ years ago. Epworth Score: 6    History of Present Illness: Edwin [name] is a 69 y.o. male for evaluation of sleep problems.  He had a sleep study years ago and was diagnosed with sleep apnea.  He has been using CPAP ever since.  He uses [employer] for his DME.  He gets replacement parts on a regular basis, but still has the same machine from his original set up.  He was advised by his PCP that he should be monitored by sleep specialist while he is on CPAP therapy.  He uses his CPAP every night >> his wife will hear him snoring if he does not use his CPAP and then he puts it on.  He uses nasal pillows.  He goes to sleep at 11 pm.  He falls asleep quickly.  He wakes up occasionally to use the bathroom.  He gets out of bed at 7 am.  He feels okay in the morning.  He denies morning headache.  He does not use anything to help him fall sleep or stay awake.  He denies sleep walking, sleep talking, bruxism, or nightmares.  There is no history of restless legs.  He denies sleep hallucinations, sleep paralysis, or cataplexy.  The Epworth score is 6 out of 24.  Tests:   Edwin [name]  has a past medical history of Diverticulosis; Rosanna Randy syndrome; Hyperglycemia; Cancer; Tubular adenoma of colon (01/19/08); Hyperlipidemia; BPH (benign prostatic hypertrophy); OSA on CPAP; and Hypertension.  Frederick Peers  has past surgical history that includes Tonsillectomy; Knee arthroscopy; Colonoscopy w/ polypectomy (2004 & 2009); Wisdom tooth extraction; and Colonoscopy (2014).  Prior to Admission medications   Medication Sig Start Date End Date Taking? Authorizing Provider  aspirin 81 MG tablet Take 81 mg by mouth daily.     Yes Historical Provider, MD  Cholecalciferol (VITAMIN D3) 1000 UNITS CAPS Take by mouth daily.     Yes Historical Provider, MD  metoprolol succinate (TOPROL-XL) 50 MG 24 hr tablet  TAKE 1 TABLET ONCE DAILY.   Yes Hendricks Limes, MD  Multiple Vitamin (MULTIVITAMIN) tablet Take 1 tablet by mouth daily.     Yes Historical Provider, MD  Omega-3 Fatty Acids (OMEGA 3 PO) Take by mouth daily.     Yes Historical Provider, MD  CRESTOR 20 MG tablet TAKE ONE TABLET EVERY OTHER DAY. Patient not taking: Reported on 07/08/2014    Hendricks Limes, MD    No Known Allergies  His family history includes Appendicitis in his brother and sister; Asthma in his brother; Cancer in his brother; Diabetes in his father; Diverticulosis in his mother; Heart attack (age of onset: 85) in his maternal grandfather; Heart attack (age of onset: 63) in his maternal grandmother; Heart disease (age of onset: 77) in his father; Hypertension in his father and sister; Parkinsonism in his maternal grandfather and mother; Stroke in his mother; Stroke (age of onset: 19) in his paternal grandmother. There is no history of Colon cancer, Esophageal cancer, Rectal cancer, or Stomach cancer.  He  reports that he has never smoked. He has never used smokeless tobacco. He reports that he drinks alcohol. He reports that he does not use illicit drugs.  Review of Systems  Constitutional: Negative for fever and unexpected weight change.  HENT: Negative for congestion, dental problem, ear pain, nosebleeds, postnasal drip, rhinorrhea, sinus  pressure, sneezing, sore throat and trouble swallowing.   Eyes: Negative for redness and itching.  Respiratory: Negative for cough, chest tightness, shortness of breath and wheezing.   Cardiovascular: Negative for palpitations and leg swelling.  Gastrointestinal: Negative for nausea and vomiting.  Genitourinary: Negative for dysuria.  Musculoskeletal: Positive for joint swelling and arthralgias.  Skin: Negative for rash.  Neurological: Negative for headaches.  Hematological: Does not bruise/bleed easily.  Psychiatric/Behavioral: Negative for dysphoric mood. The patient is not  nervous/anxious.    Physical Exam: Blood pressure 162/90, pulse 73, temperature 98.2 F (36.8 C), temperature source Oral, height 5\' 9"  (1.753 m), weight 254 lb 6.4 oz (115.395 kg), SpO2 95 %. Body mass index is 37.55 kg/(m^2).  General - No distress ENT - No sinus tenderness, no oral exudate, no LAN, no thyromegaly, TM clear, pupils equal/reactive Cardiac - s1s2 regular, no murmur, pulses symmetric Chest - No wheeze/rales/dullness, good air entry, normal respiratory excursion Back - No focal tenderness Abd - Soft, non-tender, no organomegaly, + bowel sounds Ext - No edema Neuro - Normal strength, cranial nerves intact Skin - No rashes Psych - Normal mood, and behavior  Assessment/plan:  Obstructive sleep apnea. We discussed how sleep apnea can affect various health problems including risks for hypertension, cardiovascular disease, and diabetes.  We also discussed how sleep disruption can increase risks for accident, such as while driving.  Weight loss as a means of improving sleep apnea was also reviewed.  Additional treatment options discussed were CPAP therapy, oral appliance, and surgical intervention. Plan: - his CPAP machine is more than 67 years old >> will arrange for new CPAP machine and have it set up for auto CPAP with range of 5 to 15 cm H2O - will get copy of his download and then determine if he needs additional sleep testing  Hypertension. His blood pressure was elevated today. Plan: - he should f/u with his PCP for further assessment of this   Chesley Mires, M.D. Pager [phone]

## 2014-07-08 NOTE — Patient Instructions (Signed)
Will arrange for new CPAP machine  Follow up in 3 months 

## 2014-08-18 ENCOUNTER — Other Ambulatory Visit: Payer: Self-pay | Admitting: Internal Medicine

## 2014-08-18 ENCOUNTER — Ambulatory Visit (INDEPENDENT_AMBULATORY_CARE_PROVIDER_SITE_OTHER): Payer: Medicare Other | Admitting: Nurse Practitioner

## 2014-08-18 DIAGNOSIS — R0789 Other chest pain: Secondary | ICD-10-CM | POA: Diagnosis not present

## 2014-08-18 DIAGNOSIS — I4510 Unspecified right bundle-branch block: Secondary | ICD-10-CM

## 2014-08-18 DIAGNOSIS — R9431 Abnormal electrocardiogram [ECG] [EKG]: Secondary | ICD-10-CM

## 2014-08-18 NOTE — Progress Notes (Signed)
Exercise Treadmill Test  Pre-Exercise Testing Evaluation Rhythm: normal sinus  Rate: 66 bpm     Test  Exercise Tolerance Test Ordering MD: Ena Dawley, MD  Interpreting MD: Ignacia Bayley, NP  Unique Test No: 1  Treadmill:  1  Indication for ETT: chest pain - rule out ischemia  Contraindication to ETT: No   Stress Modality: exercise - treadmill  Cardiac Imaging Performed: non   Protocol: standard Bruce - maximal  Max BP:  208/67  Max MPHR (bpm):  154 85% MPR (bpm):  131  MPHR obtained (bpm):  150 % MPHR obtained:  97  Reached 85% MPHR (min:sec):  4:53 Total Exercise Time (min-sec):  7:37  Workload in METS:  9.3 Borg Scale: 16  Reason ETT Terminated:  fatigue    ST Segment Analysis At Rest: non-specific ST segment slurring With Exercise: Pt has RBBB @ baseline with associated ST/T changes in III, V1-V5. Those changes became more pronounced with exercise.  Other Information Arrhythmia:  Frequent PVC's Angina during ETT:  absent (0) Quality of ETT:  indeterminate  ETT Interpretation:  borderline (indeterminate) with non-specific ST changes  Comments: As above, pt with baseline RBBB and associated ST/T changes. These changes become more pronounced with exercise and return to baseline in recovery. He had good exercise tolerance without chest pain.   Recommendations:  If there remains concern re: his atypical chest pain, I recommend a f/u lexiscan cardiolite to more definitively r/o ischemia.

## 2014-08-18 NOTE — Patient Instructions (Signed)
I recommend cardiology consultation further evaluation as the stress test is nondiagnostic. There are nonspecific changes which do increase somewhat with exercise. Cardiology referral will be made annual be notified of the appointment. [employer]

## 2014-09-22 DIAGNOSIS — D225 Melanocytic nevi of trunk: Secondary | ICD-10-CM | POA: Diagnosis not present

## 2014-09-22 DIAGNOSIS — L570 Actinic keratosis: Secondary | ICD-10-CM | POA: Diagnosis not present

## 2014-09-22 DIAGNOSIS — L821 Other seborrheic keratosis: Secondary | ICD-10-CM | POA: Diagnosis not present

## 2014-09-29 NOTE — Progress Notes (Signed)
Patient ID: Edwin [name], male   DOB: 23-Dec-1945, 69 y.o.   MRN: [id number]     Cardiology Office Note   Date:  10/01/2014   ID:  Edwin, [name] 1946/05/03, MRN [id number]  PCP:  Unice Cobble, MD  Cardiologist:   Jenkins Rouge, MD   No chief complaint on file.     History of Present Illness: Edwin [name] is a 69 y.o. male who presents for evaluation of abnormal ECG  Had ETT with PA in April Exercised for about 7 mintues Baseline ECG with RBBB with nonspecific ST changes  Should not have had treadmill with baseline abnormal ECG  As these were accentuated with stress No VT. CRF;s HTN on Rx including statin  Sleep apnea recently seen by Dr Halford Chessman  Wears CPAP Having twinges of SSCP lasting minutes  Not always exertional having for months  No associated palpitations syncope or dyspnea  Has had achilles issue and gained some weight Scared to start exercising more until CAD r/o given recent stress test results.        Past Medical History  Diagnosis Date  . Diverticulosis   . Gilbert syndrome   . Hyperglycemia   . Cancer     Basal cell of face, [name]  . Tubular adenoma of colon 01/19/08  . Hyperlipidemia     NMR Lipoprofile 2005; LDL 146 (2007/1330), HDL 38, TG 169.  LDL goal = <100; Framingham Study LDL goal =<130  . BPH (benign prostatic hypertrophy)   . OSA on CPAP   . Hypertension     Past Surgical History  Procedure Laterality Date  . Tonsillectomy    . Knee arthroscopy      [name], left knee  . Colonoscopy w/ polypectomy  2004 & 2009    Tics; [facility]  . Wisdom tooth extraction    . Colonoscopy  2014    neg; [facility]     Current Outpatient Prescriptions  Medication Sig Dispense Refill  . aspirin 81 MG tablet Take 81 mg by mouth daily.      . Cholecalciferol (VITAMIN D3) 1000 UNITS CAPS Take by mouth daily.      . metoprolol succinate (TOPROL-XL) 50 MG 24 hr tablet TAKE 1 TABLET ONCE DAILY. 90 tablet 3  . Multiple Vitamin  (MULTIVITAMIN) tablet Take 1 tablet by mouth daily.      . Omega-3 Fatty Acids (OMEGA 3 PO) Take by mouth daily.       No current facility-administered medications for this visit.    Allergies:   Review of patient's allergies indicates no known allergies.    Social History:  The patient  reports that he has never smoked. He has never used smokeless tobacco. He reports that he drinks alcohol. He reports that he does not use illicit drugs.   Family History:  The patient's family history includes Appendicitis in his brother and sister; Asthma in his brother; Cancer in his brother; Diabetes in his father; Diverticulosis in his mother; Heart attack (age of onset: 21) in his maternal grandfather; Heart attack (age of onset: 77) in his maternal grandmother; Heart disease (age of onset: 58) in his father; Hypertension in his father and sister; Parkinsonism in his maternal grandfather and mother; Stroke in his mother; Stroke (age of onset: 20) in his paternal grandmother. There is no history of Colon cancer, Esophageal cancer, Rectal cancer, or Stomach cancer.    ROS:  Please see the history of present illness.  Otherwise, review of systems are positive for none.   All other systems are reviewed and negative.      PHYSICAL EXAM: VS:  BP 126/64 mmHg  Pulse 65  Ht 5\' 9"  (1.753 m)  Wt 114.76 kg (253 lb)  BMI 37.34 kg/m2  SpO2 96% , BMI Body mass index is 37.34 kg/(m^2). Affect appropriate Healthy:  appears stated age 41: normal Neck supple with no adenopathy JVP normal no bruits no thyromegaly Lungs clear with no wheezing and good diaphragmatic motion Heart:  S1/S2 no murmur, no rub, gallop or click PMI normal Abdomen: benighn, BS positve, no tenderness, no AAA no bruit.  No HSM or HJR Distal pulses intact with no bruits No edema Neuro non-focal Skin warm and dry No muscular weakness    EKG:   06/02/14  SR rate 62 RBBB PAC;s    Recent Labs: 06/02/2014: ALT 20; BUN 18; Creatinine  0.97; Hemoglobin 15.6; Platelets 193.0; Potassium 4.4; Sodium 139; TSH 3.41    Lipid Panel    Component Value Date/Time   CHOL 202* 06/02/2014 1116   CHOL 140 05/22/2013 1005   TRIG 213* 06/02/2014 1116   TRIG 227.0* 05/22/2013 1005   TRIG 179* 05/13/2006 1239   HDL 44 06/02/2014 1116   HDL 37.80* 05/22/2013 1005   CHOLHDL 4 05/22/2013 1005   CHOLHDL 4.0 CALC 05/13/2006 1239   VLDL 45.4* 05/22/2013 1005   LDLCALC 115* 06/02/2014 1116   LDLCALC 65 06/12/2012 0732   LDLDIRECT 71.0 05/22/2013 1005      Wt Readings from Last 3 Encounters:  10/01/14 114.76 kg (253 lb)  07/08/14 115.395 kg (254 lb 6.4 oz)  06/02/14 113.966 kg (251 lb 4 oz)      Other studies Reviewed: Additional studies/ records that were reviewed today include: Epic notes  ETT by DR Linna Darner.     ASSESSMENT AND PLAN:  1.  Chest Pain:  With abnormal stress test.  Given issues with stopping statin and need to know how to risk stratify favor Calcium Score with cardiac CTA to r/o CAD Radiation dose also much lower than nuclear scanning  2. Chol;  Stopped statin ? Made him ache more and inflamed achilles  Particle size on lipomed over 1400 LDL 115  Calcium score would help [place] decide on statin Rx or just red yeast rice and lifestyle changes 3. RBBB:  New f/u yearly ECG no AV block 4. PAC;s  Benign asymptomatic on beta blocker    Current medicines are reviewed at length with the patient today.  The patient does not have concerns regarding medicines.  The following changes have been made:  no change  Labs/ tests ordered today include:  Cardiac CT if approved will need BMET   Orders Placed This Encounter  Procedures  . CT Heart Morp W/Cta Cor W/Score W/Ca W/Cm &/Or Wo/Cm     Disposition:   FU with me PRN      Signed, Jenkins Rouge, MD  10/01/2014 10:53 AM    [facility] [address], [place], [place]  [address] Phone: [phone]; Fax: [phone]

## 2014-10-01 ENCOUNTER — Encounter: Payer: Self-pay | Admitting: Cardiovascular Disease

## 2014-10-01 ENCOUNTER — Other Ambulatory Visit: Payer: Self-pay | Admitting: *Deleted

## 2014-10-01 ENCOUNTER — Ambulatory Visit (INDEPENDENT_AMBULATORY_CARE_PROVIDER_SITE_OTHER): Payer: Medicare Other | Admitting: Cardiovascular Disease

## 2014-10-01 VITALS — BP 126/64 | HR 65 | Ht 69.0 in | Wt 253.0 lb

## 2014-10-01 DIAGNOSIS — R0789 Other chest pain: Secondary | ICD-10-CM | POA: Diagnosis not present

## 2014-10-01 DIAGNOSIS — Z01812 Encounter for preprocedural laboratory examination: Secondary | ICD-10-CM

## 2014-10-01 NOTE — Patient Instructions (Addendum)
Medication Instructions: NO CHANGES   Labwork: MAY NEED  BMET  IF  CTA  IS APPROVED   Testing/Procedures: Your physician has requested that you have cardiac CT. Cardiac computed tomography (CT) is a painless test that uses an x-ray machine to take clear, detailed pictures of your heart. For further information please visit [email or web address]. Please follow instruction sheet as given.   CTA   Follow-Up:  AS NEEDED Any Other Special Instructions Will Be Listed Below (If Applicable).

## 2014-10-05 ENCOUNTER — Other Ambulatory Visit: Payer: Self-pay

## 2014-10-05 ENCOUNTER — Other Ambulatory Visit (INDEPENDENT_AMBULATORY_CARE_PROVIDER_SITE_OTHER): Payer: Medicare Other

## 2014-10-05 DIAGNOSIS — Z01812 Encounter for preprocedural laboratory examination: Secondary | ICD-10-CM | POA: Diagnosis not present

## 2014-10-05 LAB — BASIC METABOLIC PANEL
BUN: 15 mg/dL (ref 6–23)
CO2: 26 meq/L (ref 19–32)
CREATININE: 1 mg/dL (ref 0.40–1.50)
Calcium: 9.1 mg/dL (ref 8.4–10.5)
Chloride: 105 mEq/L (ref 96–112)
GFR: 79.27 mL/min (ref >60.00)
Glucose, Bld: 112 mg/dL — ABNORMAL HIGH (ref 70–99)
Potassium: 4.1 mEq/L (ref 3.5–5.1)
SODIUM: 140 meq/L (ref 135–145)

## 2014-10-12 ENCOUNTER — Ambulatory Visit (HOSPITAL_COMMUNITY)
Admission: RE | Admit: 2014-10-12 | Discharge: 2014-10-12 | Disposition: A | Discharge status: Home / Self Care | Payer: Medicare Other | Source: Ambulatory Visit | Attending: Cardiovascular Disease | Admitting: Cardiovascular Disease

## 2014-10-12 DIAGNOSIS — K760 Fatty (change of) liver, not elsewhere classified: Secondary | ICD-10-CM | POA: Insufficient documentation

## 2014-10-12 DIAGNOSIS — R079 Chest pain, unspecified: Secondary | ICD-10-CM | POA: Diagnosis not present

## 2014-10-12 DIAGNOSIS — I2584 Coronary atherosclerosis due to calcified coronary lesion: Secondary | ICD-10-CM | POA: Diagnosis not present

## 2014-10-12 DIAGNOSIS — I2510 Atherosclerotic heart disease of native coronary artery without angina pectoris: Secondary | ICD-10-CM | POA: Insufficient documentation

## 2014-10-12 DIAGNOSIS — R0789 Other chest pain: Secondary | ICD-10-CM | POA: Diagnosis not present

## 2014-10-12 IMAGING — CT CT HEART MORP W/ CTA COR W/ SCORE W/ CA W/CM &/OR W/O CM
1 of 10 series · 1 of 20 positions shown, 2 images · IV contrast (Iodine)
Comparison: No priors.

CLINICAL DATA: Abnormal ETT

EXAM:
Cardiac CTA
MEDICATIONS:
Sub lingual nitro. 4mg and lopressor 15mg
TECHNIQUE: The patient was scanned on a [employer] [REDACTED]ice scanner. Gantry
rotation speed was 270 msecs. Collimation was .9mm. A 100 kV
prospective scan was triggered in the descending thoracic aorta at
111 HU's with 5% padding centered around 78% of the R-R interval.
Average HR during the scan was 68 bpm. The 3D data set was
interpreted on a dedicated work station using MPR, MIP and VRT
modes. A total of 80cc of contrast was used.

[Series 300: locator · axial · 0.35mm/px · z∈[-284,-284]mm · 1 of 1 slices shown, 2 images]
[im 1/1  vessel]
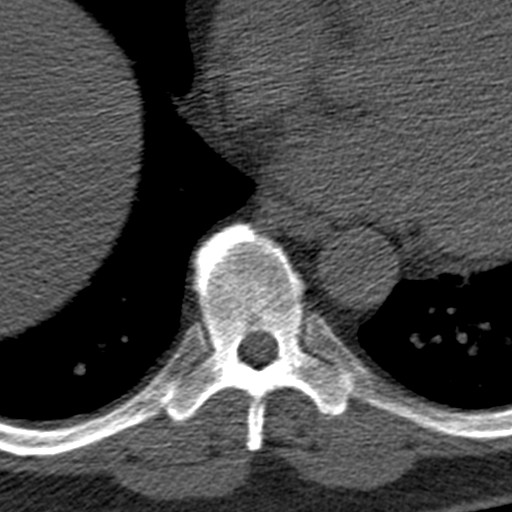
[im 1/1  lung]
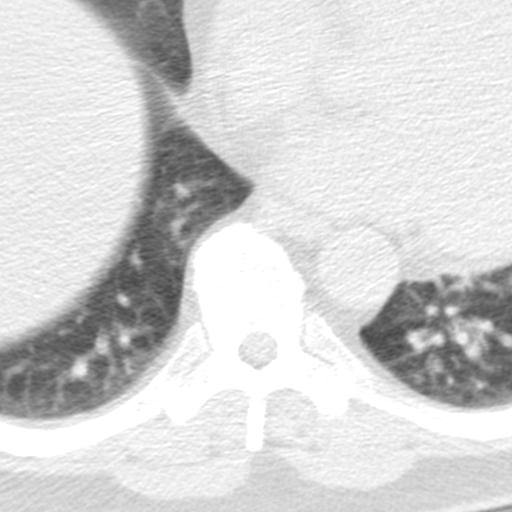

[1 of 20 positions shown; findings below may reference images not displayed]

FINDINGS: Non-cardiac: See separate report from [REDACTED]. No
significant findings on limited lung and soft tissue windows.

Calcium Score: Very high [YK] especially in proximal and mid LAD.
Involves all 3 coronary vessels

Coronary Arteries: Right dominant with no anomalies

LM:  Less than 30% soft plaque in distal vessel

LAD: Heavily calcified proximal and mid vessel 50% or less stenosis
in proximal and mid vessel

D1: Normal

D2: Normal

D3 Large 50% calcified disease in association with LAD disease

Circumflex: Normal

OM1:  High take off IM position normal

AV Groove  Normal

RCA:  Less than 50% calcified proximal and mid vessel disease

PDA: Normal

PLA:  Normal
IMPRESSION: 1) Calcium Score [YK] dense in proximal and mid LAD 91st percentile
for age and sex matched controls

2) Moderate 50% proximal and mid LAD calcified disease and 50%
ostial D3 disease No obstructive soft plaque in LM

Less than 50% calcified proximal and mid RCA disease

3) Given high calcium score consider f/u perfusion study

[facility]

EXAM:
OVER-READ INTERPRETATION  CT CHEST

The following report is an over-read performed by radiologist Dr.
over-read does not include interpretation of cardiac or coronary
anatomy or pathology. The coronary calcium score/coronary CTA
interpretation by the cardiologist is attached.
FINDINGS: Within the visualized portions of the lungs there are no suspicious
appearing pulmonary nodules or masses, there is no acute
consolidative airspace disease, no pleural effusions, and no
pneumothorax. No lymphadenopathy noted in the visualized portions of
the thorax. Visualized portions of the upper abdomen are remarkable
for diffuse low attenuation throughout the hepatic parenchyma,
compatible with hepatic steatosis. There are no aggressive appearing
lytic or blastic lesions noted in the visualized portions of the
skeleton.
IMPRESSION: 1. Mild hepatic steatosis.

## 2014-10-12 MED ORDER — METOPROLOL TARTRATE 1 MG/ML IV SOLN
5.0000 mg | INTRAVENOUS | Status: DC | PRN
  Administered 2014-10-12 (×3): 5 mg via INTRAVENOUS
  Filled 2014-10-12 (×2): qty 5

## 2014-10-12 MED ORDER — IOHEXOL 350 MG/ML SOLN
80.0000 mL | Freq: Once | INTRAVENOUS | Status: AC | PRN
  Administered 2014-10-12: 100 mL via INTRAVENOUS

## 2014-10-12 MED ORDER — NITROGLYCERIN 0.4 MG SL SUBL
SUBLINGUAL_TABLET | SUBLINGUAL | Status: AC
  Administered 2014-10-12: 0.4 mg
  Filled 2014-10-12: qty 1

## 2014-10-12 MED ORDER — METOPROLOL TARTRATE 1 MG/ML IV SOLN
INTRAVENOUS | Status: AC
  Filled 2014-10-12: qty 15

## 2014-10-13 ENCOUNTER — Telehealth: Payer: Self-pay | Admitting: *Deleted

## 2014-10-13 DIAGNOSIS — R931 Abnormal findings on diagnostic imaging of heart and coronary circulation: Secondary | ICD-10-CM

## 2014-10-13 DIAGNOSIS — Z79899 Other long term (current) drug therapy: Secondary | ICD-10-CM

## 2014-10-13 MED ORDER — ATORVASTATIN CALCIUM 10 MG PO TABS
10.0000 mg | ORAL_TABLET | Freq: Every day | ORAL | Status: DC
Start: 2014-10-13 — End: 2015-09-28

## 2014-10-13 NOTE — Telephone Encounter (Signed)
Needs lipitor 10 mg generic called in with SL nitrol F/U with me next available Chol and LFT;s in 3 months      I called him with results of cardiac CT      LM TO CALL BACK .Adonis Housekeeper

## 2014-10-13 NOTE — Telephone Encounter (Signed)
PT  AWARE./CY 

## 2014-10-21 ENCOUNTER — Other Ambulatory Visit: Payer: Self-pay | Admitting: *Deleted

## 2014-10-21 ENCOUNTER — Telehealth: Payer: Self-pay | Admitting: Cardiovascular Disease

## 2014-10-21 MED ORDER — NITROGLYCERIN 0.4 MG SL SUBL: 0.4000 mg | SUBLINGUAL_TABLET | SUBLINGUAL | Status: DC | PRN

## 2014-10-21 NOTE — Telephone Encounter (Signed)
error 

## 2014-10-21 NOTE — Telephone Encounter (Signed)
Patient called and stated that Dr [name] Cancel told him that he would give him an rx for nitro. I did not see any order for this, but per phone note 10/13/14 "Needs lipitor 10 mg generic called in with SL nitrol F/U with me next available Chol and LFT;s in 3 months."I will sent nitro in for patient.

## 2014-10-22 ENCOUNTER — Ambulatory Visit (HOSPITAL_COMMUNITY): Payer: Medicare Other

## 2014-11-12 ENCOUNTER — Encounter: Payer: Self-pay | Admitting: Adult Health

## 2014-11-12 ENCOUNTER — Ambulatory Visit (INDEPENDENT_AMBULATORY_CARE_PROVIDER_SITE_OTHER): Payer: Medicare Other | Admitting: Adult Health

## 2014-11-12 VITALS — BP 150/72 | HR 82 | Temp 97.9°F | Ht 69.0 in | Wt 266.2 lb

## 2014-11-12 DIAGNOSIS — G4733 Obstructive sleep apnea (adult) (pediatric): Secondary | ICD-10-CM

## 2014-11-12 DIAGNOSIS — I2510 Atherosclerotic heart disease of native coronary artery without angina pectoris: Secondary | ICD-10-CM

## 2014-11-12 NOTE — Patient Instructions (Signed)
Continue on CPAP At bedtime  .  Work on weight loss.  Do not drive if sleepy.  follow up Dr. Halford Chessman  In 1 year and As needed

## 2014-11-12 NOTE — Progress Notes (Signed)
   Subjective:    Patient ID: Edwin [name], male    DOB: 06-03-1945, 69 y.o.   MRN: [id number]  HPI 69 yo male with OSA on nocturnal CPAP    11/12/2014 Follow up : OSA  Pt returns for follow up for sleep apnea.  Says he is doing well on CPAP .  No mask issues. Feels rested.  Download show good compliance .  Avg usage ~8hrs , AHI 1.9. Min leaks.  Discussed wt loss and not driving if sleepy.  Denies chest pain, orthopnea, edema or fever.      Review of Systems Constitutional:   No  weight loss, night sweats,  Fevers, chills, fatigue, or  lassitude.  HEENT:   No headaches,  Difficulty swallowing,  Tooth/dental problems, or  Sore throat,                No sneezing, itching, ear ache, nasal congestion, post nasal drip,   CV:  No chest pain,  Orthopnea, PND, swelling in lower extremities, anasarca, dizziness, palpitations, syncope.   GI  No heartburn, indigestion, abdominal pain, nausea, vomiting, diarrhea, change in bowel habits, loss of appetite, bloody stools.   Resp: No shortness of breath with exertion or at rest.  No excess mucus, no productive cough,  No non-productive cough,  No coughing up of blood.  No change in color of mucus.  No wheezing.  No chest wall deformity  Skin: no rash or lesions.  GU: no dysuria, change in color of urine, no urgency or frequency.  No flank pain, no hematuria   MS:  No joint pain or swelling.  No decreased range of motion.  No back pain.  Psych:  No change in mood or affect. No depression or anxiety.  No memory loss.          Objective:   Physical Exam GEN: A/Ox3; pleasant , NAD, obese   HEENT:  [place]/AT,  EACs-clear, TMs-wnl, NOSE-clear, THROAT-clear, no lesions, no postnasal drip or exudate noted. Class 2-3 airway   NECK:  Supple w/ fair ROM; no JVD; normal carotid impulses w/o bruits; no thyromegaly or nodules palpated; no lymphadenopathy.  RESP  Clear  P & A; w/o, wheezes/ rales/ or rhonchi.no accessory muscle use, no dullness to  percussion  CARD:  RRR, no m/r/g  , no peripheral edema, pulses intact, no cyanosis or clubbing.  GI:   Soft & nt; nml bowel sounds; no organomegaly or masses detected.  Musco: Warm bil, no deformities or joint swelling noted.   Neuro: alert, no focal deficits noted.    Skin: Warm, no lesions or rashes         Assessment & Plan:

## 2014-11-18 DIAGNOSIS — E669 Obesity, unspecified: Secondary | ICD-10-CM | POA: Insufficient documentation

## 2014-11-18 NOTE — Assessment & Plan Note (Signed)
Work on wt loss.  

## 2014-11-18 NOTE — Assessment & Plan Note (Signed)
Controlled on CPAP .  Plan  Continue on CPAP At bedtime  .  Work on weight loss.  Do not drive if sleepy.  follow up Dr. Halford Chessman  In 1 year and As needed

## 2014-11-25 ENCOUNTER — Telehealth: Payer: Self-pay | Admitting: *Deleted

## 2014-11-25 DIAGNOSIS — R931 Abnormal findings on diagnostic imaging of heart and coronary circulation: Secondary | ICD-10-CM

## 2014-11-25 NOTE — Telephone Encounter (Signed)
LEFT MESSAGE INFORMING PT  NEEDS   STRESS MYOVIEW PRIOR TO  DR [name] Cancel  APPT .Adonis Housekeeper

## 2014-11-25 NOTE — Telephone Encounter (Signed)
-----   Message from Josue Hector, MD sent at 11/16/2014  7:54 AM EDT ----- Do exercise myovue before he sees me  ----- Message -----    From: Richmond Campbell, LPN    Sent: 2/76/1848   4:39 PM      To: Josue Hector, MD  PT  HAS  APPT WITH YOU ON  8-18 TO DISCUSS  CALCIUM SCORE . DO I NEED   TO  SCHEDULE  MYOVIEW  OR DO YOU WANT  TO  SEE PT  FIRST? [place]

## 2014-11-29 ENCOUNTER — Telehealth (HOSPITAL_COMMUNITY): Payer: Self-pay | Admitting: *Deleted

## 2014-11-29 NOTE — Telephone Encounter (Signed)
Patient given detailed instructions per Myocardial Perfusion Study Information Sheet for test on 12/01/14/ at 1200. Patient Notified to arrive 15 minutes early, and that it is imperative to arrive on time for appointment to keep from having the test rescheduled. Patient verbalized understanding. Edwin [name], [place]

## 2014-11-30 ENCOUNTER — Encounter: Payer: Self-pay | Admitting: Adult Health

## 2014-12-01 ENCOUNTER — Ambulatory Visit (HOSPITAL_COMMUNITY): Payer: Medicare Other | Attending: Cardiology

## 2014-12-01 DIAGNOSIS — I2510 Atherosclerotic heart disease of native coronary artery without angina pectoris: Secondary | ICD-10-CM | POA: Diagnosis not present

## 2014-12-01 DIAGNOSIS — R931 Abnormal findings on diagnostic imaging of heart and coronary circulation: Secondary | ICD-10-CM

## 2014-12-01 LAB — MYOCARDIAL PERFUSION IMAGING
CHL RATE OF PERCEIVED EXERTION: 18
CSEPED: 6 min
Estimated workload: 7.3 METS
Exercise duration (sec): 15 s
LV dias vol: 128 mL
LV sys vol: 54 mL
MPHR: 154 {beats}/min
Peak HR: 141 {beats}/min
Percent HR: 92 %
RATE: 0.34
Rest HR: 59 {beats}/min
SDS: 0
SRS: 3
SSS: 3
TID: 0.98

## 2014-12-01 IMAGING — NM NM MISC PROCEDURE
9 series · 54 of 54 positions shown · non-contrast
Comparison: none

[Series 1: wbr_r-card_st rest · 6.4mm · 6.40mm/px · 6 of 21 frames shown]
[frame 2/21]
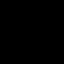
[frame 6/21]
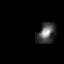
[frame 9/21]
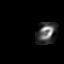
[frame 13/21]
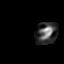
[frame 16/21]
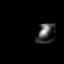
[frame 20/21]
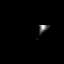

[Series 1: wbr_s-card_st stress-gsp · 6.4mm · 6.40mm/px · 6 of 184 frames shown]
[frame 16/184]
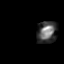
[frame 46/184]
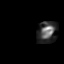
[frame 77/184]
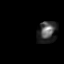
[frame 108/184]
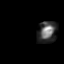
[frame 138/184]
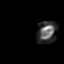
[frame 169/184]
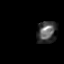

[Series 1: wbr_s-card_st stress-sum-em · 6.4mm · 6.40mm/px · 6 of 23 frames shown]
[frame 2/23]
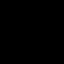
[frame 6/23]
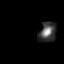
[frame 10/23]
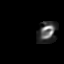
[frame 14/23]
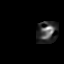
[frame 18/23]
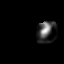
[frame 22/23]
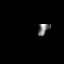

[Series 1: rest_(id)_sa · 6.4mm · 6.40mm/px · 6 of 64 frames shown]
[frame 6/64]
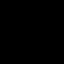
[frame 16/64]
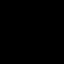
[frame 27/64]
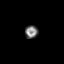
[frame 38/64]
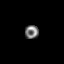
[frame 48/64]
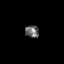
[frame 59/64]
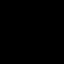

[Series 1: stress-sum-em · 6.40mm/px · 6 of 64 frames shown]
[frame 6/64]
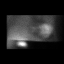
[frame 16/64]
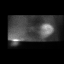
[frame 27/64]
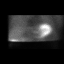
[frame 38/64]
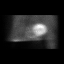
[frame 48/64]
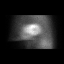
[frame 59/64]
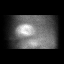

[Series 1: stress-gsp · 6.40mm/px · 6 of 500 frames shown]
[frame 42/500]
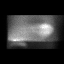
[frame 125/500]
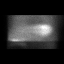
[frame 209/500]
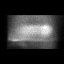
[frame 292/500]
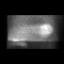
[frame 375/500]
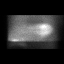
[frame 459/500]
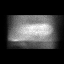

[Series 1: stress-gsp_(id)_sa · 6.4mm · 6.40mm/px · 6 of 512 frames shown]
[frame 43/512]
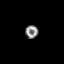
[frame 128/512]
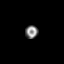
[frame 214/512]
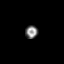
[frame 299/512]
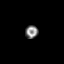
[frame 384/512]
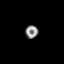
[frame 470/512]
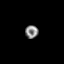

[Series 1: rest · 6.40mm/px · 6 of 64 frames shown]
[frame 6/64]
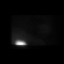
[frame 16/64]
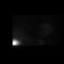
[frame 27/64]
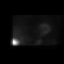
[frame 38/64]
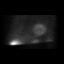
[frame 48/64]
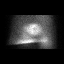
[frame 59/64]
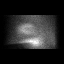

[Series 1: stress-sum-em_(id)_sa · 6.4mm · 6.40mm/px · 6 of 64 frames shown]
[frame 6/64]
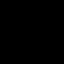
[frame 16/64]
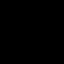
[frame 27/64]
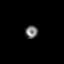
[frame 38/64]
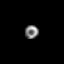
[frame 48/64]
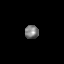
[frame 59/64]
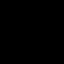

[54 of 54 positions shown; findings below may reference images not displayed]

Canned report from images found in remote index.

Refer to host system for actual result text.

## 2014-12-01 MED ORDER — TECHNETIUM TC 99M SESTAMIBI GENERIC - CARDIOLITE
29.4000 | Freq: Once | INTRAVENOUS | Status: AC | PRN
  Administered 2014-12-01: 29 via INTRAVENOUS

## 2014-12-01 MED ORDER — TECHNETIUM TC 99M SESTAMIBI GENERIC - CARDIOLITE
10.9000 | Freq: Once | INTRAVENOUS | Status: AC | PRN
  Administered 2014-12-01: 10.9 via INTRAVENOUS

## 2014-12-02 ENCOUNTER — Other Ambulatory Visit: Payer: Self-pay | Admitting: Emergency Medicine

## 2014-12-02 ENCOUNTER — Telehealth: Payer: Self-pay | Admitting: Internal Medicine

## 2014-12-02 MED ORDER — METOPROLOL SUCCINATE ER 50 MG PO TB24: 50.0000 mg | ORAL_TABLET | Freq: Every day | ORAL | Status: DC

## 2014-12-02 NOTE — Telephone Encounter (Signed)
[facility] has been trying to fax a prescription request and it gets sent back as patient unknown.  They will be faxing it back over and this is the patient. They should have the right name now on the request.

## 2014-12-06 ENCOUNTER — Encounter: Payer: Self-pay | Admitting: Cardiology

## 2014-12-23 ENCOUNTER — Encounter: Payer: Self-pay | Admitting: Cardiovascular Disease

## 2014-12-23 ENCOUNTER — Ambulatory Visit (INDEPENDENT_AMBULATORY_CARE_PROVIDER_SITE_OTHER): Payer: Medicare Other | Admitting: Cardiovascular Disease

## 2014-12-23 VITALS — BP 126/66 | HR 74 | Ht 69.0 in | Wt 264.0 lb

## 2014-12-23 DIAGNOSIS — I2510 Atherosclerotic heart disease of native coronary artery without angina pectoris: Secondary | ICD-10-CM | POA: Diagnosis not present

## 2014-12-23 DIAGNOSIS — Z79899 Other long term (current) drug therapy: Secondary | ICD-10-CM | POA: Diagnosis not present

## 2014-12-23 DIAGNOSIS — E785 Hyperlipidemia, unspecified: Secondary | ICD-10-CM

## 2014-12-23 NOTE — Progress Notes (Signed)
Patient ID: Edwin [name], male   DOB: July 06, 1945, 69 y.o.   MRN: [id number]     Cardiology Office Note   Date:  12/23/2014   ID:  Shankar, Silber 1945/05/13, MRN [id number]  PCP:  Unice Cobble, MD  Cardiologist:   Jenkins Rouge, MD   No chief complaint on file.     History of Present Illness: Edwin [name] is a 69 y.o. male initially seen for abnormal ECG 09/2014    Had ETT with PA in April Exercised for about 7 mintues Baseline ECG with RBBB with nonspecific ST changes  Should not have had treadmill with baseline abnormal ECG  As these were accentuated with stress No VT. CRF;s HTN on Rx including statin  Sleep apnea recently seen by Dr Halford Chessman  Wears CPAP Having twinges of SSCP lasting minutes  Not always exertional having for months  No associated palpitations syncope or dyspnea  Has had achilles issue and gained some weight Scared to start exercising more until CAD r/o given recent stress test results.    09/11/14 cardiac CT showed very high calcium score and moderate CAD  Reviewed IMPRESSION: 1) Calcium Score 1010 dense in proximal and mid LAD 91st percentile for age and sex matched controls  2) Moderate 50% proximal and mid LAD calcified disease and 50% ostial D3 disease No obstructive soft plaque in LM  Less than 50% calcified proximal and mid RCA disease  3) Given high calcium score consider f/u perfusion study  F/U perfusion study 12/01/14 reviewed:  EF 58%  Normal with no ischemia or infarction      Past Medical History  Diagnosis Date  . Diverticulosis   . Gilbert syndrome   . Hyperglycemia   . Cancer     Basal cell of face, [name]  . Tubular adenoma of colon 01/19/08  . Hyperlipidemia     NMR Lipoprofile 2005; LDL 146 (2007/1330), HDL 38, TG 169.  LDL goal = <100; Framingham Study LDL goal =<130  . BPH (benign prostatic hypertrophy)   . OSA on CPAP   . Hypertension     Past Surgical History  Procedure Laterality Date  . Tonsillectomy    . Knee  arthroscopy      [name], left knee  . Colonoscopy w/ polypectomy  2004 & 2009    Tics; [facility]  . Wisdom tooth extraction    . Colonoscopy  2014    neg; [facility]     Current Outpatient Prescriptions  Medication Sig Dispense Refill  . aspirin 81 MG tablet Take 81 mg by mouth daily.      [name] atorvastatin (LIPITOR) 10 MG tablet Take 1 tablet (10 mg total) by mouth daily. 90 tablet 3  . Cholecalciferol (VITAMIN D3) 1000 UNITS CAPS Take by mouth daily.      . metoprolol succinate (TOPROL-XL) 50 MG 24 hr tablet Take 1 tablet (50 mg total) by mouth daily. Take with or immediately following a meal. 90 tablet 1  . Multiple Vitamin (MULTIVITAMIN) tablet Take 1 tablet by mouth daily.      . nitroGLYCERIN (NITROSTAT) 0.4 MG SL tablet Place 1 tablet (0.4 mg total) under the tongue every 5 (five) minutes as needed for chest pain. 25 tablet 5  . Omega-3 Fatty Acids (OMEGA 3 PO) Take 1 capsule by mouth daily.      No current facility-administered medications for this visit.    Allergies:   Review of patient's allergies indicates no known  allergies.    Social History:  The patient  reports that he has never smoked. He has never used smokeless tobacco. He reports that he drinks alcohol. He reports that he does not use illicit drugs.   Family History:  The patient's family history includes Appendicitis in his brother and sister; Asthma in his brother; Cancer in his brother; Diabetes in his father; Diverticulosis in his mother; Heart attack (age of onset: 49) in his maternal grandfather; Heart attack (age of onset: 23) in his maternal grandmother; Heart disease (age of onset: 74) in his father; Hypertension in his father and sister; Parkinsonism in his maternal grandfather and mother; Stroke in his mother; Stroke (age of onset: 55) in his paternal grandmother. There is no history of Colon cancer, Esophageal cancer, Rectal cancer, or Stomach cancer.    ROS:  Please see the history of  present illness.   Otherwise, review of systems are positive for none.   All other systems are reviewed and negative.      PHYSICAL EXAM: VS:  BP 126/66 mmHg  Pulse 74  Ht 5\' 9"  (1.753 m)  Wt 119.75 kg (264 lb)  BMI 38.97 kg/m2 , BMI Body mass index is 38.97 kg/(m^2). Affect appropriate Healthy:  appears stated age 84: normal Neck supple with no adenopathy JVP normal no bruits no thyromegaly Lungs clear with no wheezing and good diaphragmatic motion Heart:  S1/S2 no murmur, no rub, gallop or click PMI normal Abdomen: benighn, BS positve, no tenderness, no AAA no bruit.  No HSM or HJR Distal pulses intact with no bruits No edema Neuro non-focal Skin warm and dry No muscular weakness    EKG:   06/02/14  SR rate 62 RBBB PAC;s    Recent Labs: 06/02/2014: ALT 20; Hemoglobin 15.6; Platelets 193.0; TSH 3.41 10/05/2014: BUN 15; Creatinine, Ser 1.00; Potassium 4.1; Sodium 140    Lipid Panel    Component Value Date/Time   CHOL 202* 06/02/2014 1116   CHOL 140 05/22/2013 1005   TRIG 213* 06/02/2014 1116   TRIG 227.0* 05/22/2013 1005   TRIG 179* 05/13/2006 1239   HDL 44 06/02/2014 1116   HDL 37.80* 05/22/2013 1005   CHOLHDL 4 05/22/2013 1005   CHOLHDL 4.0 CALC 05/13/2006 1239   VLDL 45.4* 05/22/2013 1005   LDLCALC 115* 06/02/2014 1116   LDLCALC 65 06/12/2012 0732   LDLDIRECT 71.0 05/22/2013 1005      Wt Readings from Last 3 Encounters:  12/23/14 119.75 kg (264 lb)  12/01/14 120.657 kg (266 lb)  11/12/14 120.748 kg (266 lb 3.2 oz)      Other studies Reviewed: Additional studies/ records that were reviewed today include: Epic notes  ETT by DR Linna Darner.     ASSESSMENT AND PLAN:  1.  CAD: Moderate LAD/Circ disease by cardiac CT normal perfusion study continue risk factor modification and medical Rx 2. Chol:   Cholesterol is at goal.  Continue current dose of statin and diet Rx.  No myalgias or side effects.  F/U  LFT's in 6 months. Lab Results  Component Value  Date   LDLCALC 115* 06/02/2014             3. RBBB:  New f/u yearly ECG no AV block 4. PAC;s  Benign asymptomatic on beta blocker    Current medicines are reviewed at length with the patient today.  The patient does not have concerns regarding medicines.  The following changes have been made:  no change  Labs/ tests ordered today None  Orders Placed This Encounter  Procedures  . Lipid panel  . Hepatic function panel     Disposition:   FU with me  6 months     Signed, Jenkins Rouge, MD  12/23/2014 4:48 PM    [facility] [place], [place], [place]  [address] Phone: [phone]; Fax: [phone]

## 2014-12-23 NOTE — Patient Instructions (Signed)
Medication Instructions:  NONE Labwork: MID  November FASTING LIPID LIVER  Testing/Procedures: NONE  Follow-Up: Your physician wants you to follow-up in: [place] will receive a reminder letter in the mail two months in advance. If you don't receive a letter, please call our office to schedule the follow-up appointment.  Any Other Special Instructions Will Be Listed Below (If Applicable).

## 2014-12-30 ENCOUNTER — Ambulatory Visit (INDEPENDENT_AMBULATORY_CARE_PROVIDER_SITE_OTHER): Payer: Medicare Other | Admitting: Family Medicine

## 2014-12-30 ENCOUNTER — Other Ambulatory Visit (INDEPENDENT_AMBULATORY_CARE_PROVIDER_SITE_OTHER): Payer: Medicare Other

## 2014-12-30 ENCOUNTER — Encounter: Payer: Self-pay | Admitting: Family Medicine

## 2014-12-30 VITALS — BP 122/84 | HR 76 | Wt 261.0 lb

## 2014-12-30 DIAGNOSIS — M13861 Other specified arthritis, right knee: Secondary | ICD-10-CM | POA: Diagnosis not present

## 2014-12-30 DIAGNOSIS — I2510 Atherosclerotic heart disease of native coronary artery without angina pectoris: Secondary | ICD-10-CM

## 2014-12-30 DIAGNOSIS — M1711 Unilateral primary osteoarthritis, right knee: Secondary | ICD-10-CM

## 2014-12-30 DIAGNOSIS — M25561 Pain in right knee: Secondary | ICD-10-CM

## 2014-12-30 NOTE — Assessment & Plan Note (Signed)
Patient does have more of a patellofemoral arthritis. We discussed icing regimen. We discussed possibly bracing which patient declined. Patient given home exercises and work with Product/process development scientist. Continue the natural supplementations and patient will continue with the topical anti-inflammatory. Patient will come back and see me again in 3 weeks. Continued have pain I would consider injection as well as baseline x-rays.

## 2014-12-30 NOTE — Progress Notes (Signed)
Pre visit review using our clinic review tool, if applicable. No additional management support is needed unless otherwise documented below in the visit note. 

## 2014-12-30 NOTE — Progress Notes (Signed)
[name] Edwin [name], [place] [address] Phone: [phone] Subjective:      CC: Right knee pain   NLG:Edwin [name] is a 69 y.o. male coming in with complaint of right knee pain. This is a new program for this patient. Patient states she does not rib or any true injury. Patient states that this is come on over time. Seems to be worse after sitting a long amount of time. Some discomfort when he is walking on a treadmill. No pain though when he is biking or when he is on an elliptical. States that he can feel like it's unstable. States that it seems to be mostly the anterior aspect of the knee. Denies any radiation. Denies giving out on him. If he moves the leg without any weightbearing for approximately 1 minute and then starts walking he does well. Denies any swelling. Denies any nighttime awakening. Patient is concerned because he does not want this to become more frequent.     Past medical history, social, surgical and family history all reviewed in electronic medical record.   Review of Systems: No headache, visual changes, nausea, vomiting, diarrhea, constipation, dizziness, abdominal pain, skin rash, fevers, chills, night sweats, weight loss, swollen lymph nodes, body aches, joint swelling, muscle aches, chest pain, shortness of breath, mood changes.   Objective There were no vitals taken for this visit.  General: No apparent distress alert and oriented x3 mood and affect normal, dressed appropriately.  HEENT: Pupils equal, extraocular movements intact  Respiratory: Patient's speak in full sentences and does not appear short of breath  Cardiovascular: No lower extremity edema, non tender, no erythema  Skin: Warm dry intact with no signs of infection or rash on extremities or on axial skeleton.  Abdomen: Soft nontender  Neuro: Cranial nerves II through XII are intact, neurovascularly intact in all extremities with 2+ DTRs and 2+  pulses.  Lymph: No lymphadenopathy of posterior or anterior cervical chain or axillae bilaterally.  Gait normal with good balance and coordination.  MSK:  Non tender with full range of motion and good stability and symmetric strength and tone of shoulders, elbows, wrist, hip, and ankles bilaterally.  Knee: Right Normal to inspection with no erythema or effusion or obvious bony abnormalities. Tender over the patellofemoral laterally ROM full in flexion and extension and lower leg rotation. Ligaments with solid consistent endpoints including ACL, PCL, LCL, MCL. Negative Mcmurray's, Apley's, and Thessalonian tests. Mild painful compression of the patella as well as lateral J tracking noted Patellar glide with moderate crepitus. Patellar and quadriceps tendons unremarkable. Hamstring and quadriceps strength is normal.   MSK US performed of: Right knee This study was ordered, performed, and interpreted by Charlann Boxer D.O.  Knee: All structures visualized. Anteromedial, anterolateral, posteromedial, and posterolateral menisci unremarkable without tearing, fraying, effusion, or displacement. Mild degenerative changes noted Moderate narrowing of the medial joint line Severe narrowing of the patellofemoral  Patellar Tendon unremarkable on long and transverse views without effusion. No abnormality of prepatellar bursa. LCL and MCL unremarkable on long and transverse views. No abnormality of origin of medial or lateral head of the gastrocnemius.  IMPRESSION:  Patellofemoral arthritis  Procedure note 81448; 15 minutes spent for Therapeutic exercises as stated in above notes.  This included exercises focusing on stretching, strengthening, with significant focus on eccentric aspects.  Flexion and extension exercises as well as vastus medialis oblique strengthening and hip abductor strengthening given. Proper technique shown and discussed  handout in great detail with ATC.  All questions were discussed  and answered.     Impression and Recommendations:     This case required medical decision making of moderate complexity.

## 2014-12-30 NOTE — Patient Instructions (Addendum)
Good to se you Ice your knee a few times a day, after activity and before bed Rehab exercises 3x/week to strengthen quad muscles  Continue using the Vitamin D daily  Use Pennsaid on this area, 2x/day  If no improvement, we can try an injection and a brace to help with symptoms  Come back in 3-4 weeks

## 2015-01-27 ENCOUNTER — Ambulatory Visit: Payer: Self-pay | Admitting: Internal Medicine

## 2015-02-08 ENCOUNTER — Ambulatory Visit: Payer: Self-pay | Admitting: Family Medicine

## 2015-03-21 ENCOUNTER — Other Ambulatory Visit: Payer: Medicare Other

## 2015-03-24 ENCOUNTER — Telehealth: Payer: Self-pay | Admitting: Cardiovascular Disease

## 2015-03-24 ENCOUNTER — Other Ambulatory Visit: Payer: Self-pay | Admitting: Cardiovascular Disease

## 2015-03-24 ENCOUNTER — Other Ambulatory Visit (INDEPENDENT_AMBULATORY_CARE_PROVIDER_SITE_OTHER): Payer: Medicare Other

## 2015-03-24 ENCOUNTER — Other Ambulatory Visit: Payer: Self-pay | Admitting: *Deleted

## 2015-03-24 DIAGNOSIS — Z79899 Other long term (current) drug therapy: Secondary | ICD-10-CM

## 2015-03-24 DIAGNOSIS — E785 Hyperlipidemia, unspecified: Secondary | ICD-10-CM

## 2015-03-24 LAB — HEPATIC FUNCTION PANEL
ALK PHOS: 34 U/L — AB (ref 40–115)
ALT: 22 U/L (ref 9–46)
AST: 18 U/L (ref 10–35)
Albumin: 3.9 g/dL (ref 3.6–5.1)
BILIRUBIN DIRECT: 0.2 mg/dL (ref <=0.2)
Indirect Bilirubin: 0.6 mg/dL (ref 0.2–1.2)
Total Bilirubin: 0.8 mg/dL (ref 0.2–1.2)
Total Protein: 6.6 g/dL (ref 6.1–8.1)

## 2015-03-24 LAB — LIPID PANEL
CHOL/HDL RATIO: 4.2 ratio (ref <=5.0)
CHOLESTEROL: 155 mg/dL (ref 125–200)
HDL: 37 mg/dL — AB (ref >=40)
LDL Cholesterol: 77 mg/dL (ref <130)
Triglycerides: 206 mg/dL — ABNORMAL HIGH (ref <150)
VLDL: 41 mg/dL — ABNORMAL HIGH (ref <30)

## 2015-03-24 NOTE — Telephone Encounter (Signed)
NEw Message  Katrina from [place] calling for verbal orders- pt in the lab. Please call back and discuss.

## 2015-03-24 NOTE — Telephone Encounter (Signed)
LAB ORDER FOR  LIPID AND LIVER  FAXED TO [facility] .Adonis Housekeeper

## 2015-04-27 ENCOUNTER — Telehealth: Payer: Self-pay

## 2015-04-27 NOTE — Telephone Encounter (Signed)
Left message advising patient that he can schedule nurse visit for flu vaccine or call back with vaccine information if recd somewhere else

## 2015-05-19 ENCOUNTER — Ambulatory Visit (INDEPENDENT_AMBULATORY_CARE_PROVIDER_SITE_OTHER): Payer: Medicare Other

## 2015-05-19 DIAGNOSIS — Z23 Encounter for immunization: Secondary | ICD-10-CM | POA: Diagnosis not present

## 2015-05-24 ENCOUNTER — Other Ambulatory Visit: Payer: Self-pay | Admitting: Internal Medicine

## 2015-06-07 ENCOUNTER — Encounter: Payer: Self-pay | Admitting: Family

## 2015-06-07 ENCOUNTER — Ambulatory Visit (INDEPENDENT_AMBULATORY_CARE_PROVIDER_SITE_OTHER): Payer: Medicare Other | Admitting: Family

## 2015-06-07 VITALS — BP 124/76 | HR 69 | Temp 97.6°F | Resp 18 | Ht 69.0 in | Wt 270.0 lb

## 2015-06-07 DIAGNOSIS — Z23 Encounter for immunization: Secondary | ICD-10-CM | POA: Diagnosis not present

## 2015-06-07 DIAGNOSIS — E785 Hyperlipidemia, unspecified: Secondary | ICD-10-CM

## 2015-06-07 DIAGNOSIS — Z0000 Encounter for general adult medical examination without abnormal findings: Secondary | ICD-10-CM | POA: Diagnosis not present

## 2015-06-07 DIAGNOSIS — I10 Essential (primary) hypertension: Secondary | ICD-10-CM

## 2015-06-07 DIAGNOSIS — N400 Enlarged prostate without lower urinary tract symptoms: Secondary | ICD-10-CM

## 2015-06-07 NOTE — Progress Notes (Signed)
Subjective:    Patient ID: Edwin [name], male    DOB: 02-21-1946, 70 y.o.   MRN: [id number]  Chief Complaint  Patient presents with  . Establish Care    CPE, not fasting    HPI:  Edwin [name] is a 70 y.o. male who presents today for an annual wellness visit.   1) Health Maintenance -   Diet - Averages about 3 meals per day consisting of dairy, salad, chicken, beef; occasional fast food; Caffeine intake of about 2-3 drinks per day.  Exercise - Jerelyn Scott off recently secondary to orthopedic concerns; working on restarting. Usually walks occasionally  2) Preventative Exams / Immunizations:  Dental -- Up to date  Vision -- Up to date   Health Maintenance  Topic Date Due  . Hepatitis C Screening  07-09-1945  . PNA vac Low Risk Adult (2 of 2 - PCV13) 04/14/2015  . INFLUENZA VACCINE  12/06/2015  . TETANUS/TDAP  05/11/2020  . COLONOSCOPY  02/03/2023  . ZOSTAVAX  Completed     Immunization History  Administered Date(s) Administered  . Influenza, High Dose Seasonal PF 05/22/2013  . Influenza, Seasonal, Injecte, Preservative Fre 05/20/2012  . Influenza,inj,Quad PF,36+ Mos 03/02/2014, 05/19/2015  . Pneumococcal Polysaccharide-23 04/13/2014  . Td 05/11/2010  . Zoster 12/23/2007     RISK FACTORS  Tobacco History  Smoking status  . Never Smoker   Smokeless tobacco  . Never Used     Cardiac risk factors: advanced age (older than 66 for men, 67 for women), dyslipidemia, hypertension, male gender and obesity (BMI >= 30 kg/m2).  Depression Screen  Q1: Over the past two weeks, have you felt down, depressed or hopeless? No  Q2: Over the past two weeks, have you felt little interest or pleasure in doing things? No  Have you lost interest or pleasure in daily life? No  Do you often feel hopeless? No  Do you cry easily over simple problems? No  Activities of Daily Living In your present state of health, do you have any difficulty performing the following  activities?:  Driving? No Managing money?  No Feeding yourself? No Getting from bed to chair? No Climbing a flight of stairs? No Preparing food and eating?: No Bathing or showering? No Getting dressed: No Getting to the toilet? No Using the toilet: No Moving around from place to place: No In the past year have you fallen or had a near fall?:No   Home Safety Has smoke detector and wears seat belts. No firearms. No excess sun exposure. Are there smokers in your home (other than you)?  No Do you feel safe at home?  Yes  Hearing Difficulties: No Do you often ask people to speak up or repeat themselves? No Do you experience ringing or noises in your ears? No  Do you have difficulty understanding soft or whispered voices? No    Cognitive Testing  Alert? Yes   Normal Appearance? Yes  Oriented to person? Yes  Place? Yes   Time? Yes  Recall of three objects?  Yes  Can perform simple calculations? Yes  Displays appropriate judgment? Yes  Can read the correct time from a watch face? Yes  Do you feel that you have a problem with memory? No  Do you often misplace items? No   Advanced Directives have been discussed with the patient? Yes  Current Physicians/Providers and Suppliers  1. Terri Piedra, FNP - Internal Medicine 2. Jenkins Rouge, MD - Cardiology 3. Charlann Boxer, DO -  Sports Medicine 4. Chesley Mires, MD - Pulmonology 5. Rexene Edison, NP - Pulmonology  Indicate any recent Medical Services you may have received from other than [facility] providers in the past year (date may be approximate).  All answers were reviewed with the patient and necessary referrals were made:  [name], [place]   06/07/2015   No Known Allergies   Outpatient Prescriptions Prior to Visit  Medication Sig Dispense Refill  . aspirin 81 MG tablet Take 81 mg by mouth daily.      [name] atorvastatin (LIPITOR) 10 MG tablet Take 1 tablet (10 mg total) by mouth daily. 90 tablet 3  . Cholecalciferol (VITAMIN D3)  1000 UNITS CAPS Take by mouth daily.      . metoprolol succinate (TOPROL-XL) 50 MG 24 hr tablet Take 1 tablet (50 mg total) by mouth daily. Must keep 06/06/15 appt with new provider for future refills 30 tablet 0  . Multiple Vitamin (MULTIVITAMIN) tablet Take 1 tablet by mouth daily.      . nitroGLYCERIN (NITROSTAT) 0.4 MG SL tablet Place 1 tablet (0.4 mg total) under the tongue every 5 (five) minutes as needed for chest pain. 25 tablet 5  . Omega-3 Fatty Acids (OMEGA 3 PO) Take 1 capsule by mouth daily.      No facility-administered medications prior to visit.     Past Medical History  Diagnosis Date  . Diverticulosis   . Gilbert syndrome   . Hyperglycemia   . Cancer ([place])     Basal cell of face, [name]  . Tubular adenoma of colon 01/19/08  . Hyperlipidemia     NMR Lipoprofile 2005; LDL 146 (2007/1330), HDL 38, TG 169.  LDL goal = <100; Framingham Study LDL goal =<130  . BPH (benign prostatic hypertrophy)   . OSA on CPAP   . Hypertension      Past Surgical History  Procedure Laterality Date  . Tonsillectomy    . Knee arthroscopy      [name], left knee  . Colonoscopy w/ polypectomy  2004 & 2009    Tics; [facility]  . Wisdom tooth extraction    . Colonoscopy  2014    neg; [facility]     Family History  Problem Relation Age of Onset  . Diverticulosis Mother   . Stroke Mother     late 68s  . Parkinsonism Mother   . Diabetes Father   . Hypertension Father   . Heart disease Father 38    CABG  . Hypertension Sister   . Appendicitis Sister     ruptured  . Cancer Brother     lymphoma  . Appendicitis Brother     ruptured  . Asthma Brother     childhood  . Stroke Paternal Grandmother 12  . Heart attack Maternal Grandmother 80    questionable  . Heart attack Maternal Grandfather 57  . Parkinsonism Maternal Grandfather   . Colon cancer Neg Hx   . Esophageal cancer Neg Hx   . Rectal cancer Neg Hx   . Stomach cancer Neg Hx      Social History    Social History  . Marital Status: Married    Spouse Name: N/A  . Number of Children: 2  . Years of Education: 18   Occupational History  . Pharmacist, hospital // Retired    Social History Main Topics  . Smoking status: Never Smoker   . Smokeless tobacco: Never Used  . Alcohol Use: 0.0 oz/week  0 Standard drinks or equivalent per week     Comment: socially, 3-5 beers/week  . Drug Use: No  . Sexual Activity: Not on file   Other Topics Concern  . Not on file   Social History Narrative   Fun: Travel, read, movies, concerts   Denies abuse and feels safe at home.      Review of Systems  Constitutional: Denies fever, chills, fatigue, or significant weight gain/loss. HENT: Head: Denies headache or neck pain Ears: Denies changes in hearing, ringing in ears, earache, drainage Nose: Denies discharge, stuffiness, itching, nosebleed, sinus pain Throat: Denies sore throat, hoarseness, dry mouth, sores, thrush Eyes: Denies loss/changes in vision, pain, redness, blurry/double vision, flashing lights Cardiovascular: Denies chest pain/discomfort, tightness, palpitations, shortness of breath with activity, difficulty lying down, swelling, sudden awakening with shortness of breath Respiratory: Denies shortness of breath, cough, sputum production, wheezing Gastrointestinal: Denies dysphasia, heartburn, change in appetite, nausea, change in bowel habits, rectal bleeding, constipation, diarrhea, yellow skin or eyes Genitourinary: Denies frequency, urgency, burning/pain, blood in urine, incontinence, change in urinary strength. Musculoskeletal: Denies muscle/joint pain, stiffness, back pain, redness or swelling of joints, trauma Skin: Denies rashes, lumps, itching, dryness, color changes, or hair/nail changes Neurological: Denies dizziness, fainting, seizures, weakness, numbness, tingling, tremor Psychiatric - Denies nervousness, stress, depression or memory loss Endocrine: Denies heat or cold  intolerance, sweating, frequent urination, excessive thirst, changes in appetite Hematologic: Denies ease of bruising or bleeding    Objective:    BP 124/76 mmHg  Pulse 69  Temp(Src) 97.6 F (36.4 C) (Oral)  Resp 18  Ht 5\' 9"  (1.753 m)  Wt 270 lb (122.471 kg)  BMI 39.85 kg/m2  SpO2 98% Nursing note and vital signs reviewed.  Physical Exam  Constitutional: He is oriented to person, place, and time. He appears well-developed and well-nourished.  HENT:  Head: Normocephalic.  Right Ear: Hearing, tympanic membrane, external ear and ear canal normal.  Left Ear: Hearing, tympanic membrane, external ear and ear canal normal.  Nose: Nose normal.  Mouth/Throat: Uvula is midline, oropharynx is clear and moist and mucous membranes are normal.  Eyes: Conjunctivae and EOM are normal. Pupils are equal, round, and reactive to light.  Neck: Neck supple. No JVD present. No tracheal deviation present. No thyromegaly present.  Cardiovascular: Normal rate, regular rhythm, normal heart sounds and intact distal pulses.   Pulmonary/Chest: Effort normal and breath sounds normal.  Abdominal: Soft. Bowel sounds are normal. He exhibits no distension and no mass. There is no tenderness. There is no rebound and no guarding.  Musculoskeletal: Normal range of motion. He exhibits no edema or tenderness.  Lymphadenopathy:    He has no cervical adenopathy.  Neurological: He is alert and oriented to person, place, and time. He has normal reflexes. No cranial nerve deficit. He exhibits normal muscle tone. Coordination normal.  Skin: Skin is warm and dry.  Psychiatric: He has a normal mood and affect. His behavior is normal. Judgment and thought content normal.       Assessment & Plan:   During the course of the visit the patient was educated and counseled about appropriate screening and preventive services including:    Pneumococcal vaccine   Influenza vaccine  Td vaccine  Prostate cancer  screening  Colorectal cancer screening  Glaucoma screening  Diet review for nutrition referral? Yes ____  Not Indicated _X___   Patient Instructions (the written plan) was given to the patient.  Medicare Attestation I have personally reviewed: The patient's medical  and social history Their use of alcohol, tobacco or illicit drugs Their current medications and supplements The patient's functional ability including ADLs,fall risks, home safety risks, cognitive, and hearing and visual impairment Diet and physical activities Evidence for depression or mood disorders  The patient's weight, height, BMI,  have been recorded in the chart.  I have made referrals, counseling, and provided education to the patient based on review of the above and I have provided the patient with a written personalized care plan for preventive services.     [name], FNP   06/07/2015    Problem List Items Addressed This Visit      Cardiovascular and Mediastinum   Essential hypertension   Relevant Orders   Comprehensive metabolic panel   CBC   Lipid panel     Genitourinary   BPH (benign prostatic hyperplasia)   Relevant Orders   PSA     Other   Hyperlipidemia   Relevant Orders   Comprehensive metabolic panel   CBC   Lipid panel   Morbid obesity ([place])    Discussed importance of BMI reduction to improve overall health through increasing physical activity and nutrition. Patient's questions regarding nutritional supplements and weight loss answered. Goal weight loss of 5-10% of current body weight. Continue to monitor.      Relevant Orders   Comprehensive metabolic panel   CBC   Lipid panel   Medicare annual wellness visit, subsequent - Primary    Reviewed and updated patient's medical, surgical, family and social history. Medications and allergies were also reviewed. Basic screenings for depression, activities of daily living, hearing, cognition and safety were performed. Provider list  was updated and health plan was provided to the patient.  Prevnar updated today. Obtain PSA for prostate screening. Obtain hepatitis C antibody for hepatitis C screening. All other screenings are up-to-date per recommendations. Overall well exam with risk factors for cardiovascular disease including dyslipidemia, hypertension, and obesity. Hypertension is well-controlled with current medication regimen. Hyperlipidemia will be evaluated with current lipid profile. Discussed importance of decreasing BMI to improve health through increasing physical activity to 30 minutes most days of the week and consuming a moderate, varied, and balanced nutritional intake that focuses on nutrient dense foods. Goal weight loss of 5-10% of current body weight in a proximally 6 months. Patient's questions answered regarding nutritional supplements and weight loss. Follow-up prevention exam in 1 year. Follow-up office visit pending blood work.       Other Visit Diagnoses    Routine general medical examination at a health care facility        Relevant Orders    Hepatitis C antibody    Need for vaccination with 13-polyvalent pneumococcal conjugate vaccine        Relevant Orders    Pneumococcal conjugate vaccine 13-valent IM (Completed)

## 2015-06-07 NOTE — Assessment & Plan Note (Addendum)
Reviewed and updated patient's medical, surgical, family and social history. Medications and allergies were also reviewed. Basic screenings for depression, activities of daily living, hearing, cognition and safety were performed. Provider list was updated and health plan was provided to the patient.  Prevnar updated today. Obtain PSA for prostate screening. Obtain hepatitis C antibody for hepatitis C screening. All other screenings are up-to-date per recommendations. Overall well exam with risk factors for cardiovascular disease including dyslipidemia, hypertension, and obesity. Hypertension is well-controlled with current medication regimen. Hyperlipidemia will be evaluated with current lipid profile. Discussed importance of decreasing BMI to improve health through increasing physical activity to 30 minutes most days of the week and consuming a moderate, varied, and balanced nutritional intake that focuses on nutrient dense foods. Goal weight loss of 5-10% of current body weight in a proximally 6 months. Patient's questions answered regarding nutritional supplements and weight loss. Follow-up prevention exam in 1 year. Follow-up office visit pending blood work.

## 2015-06-07 NOTE — Assessment & Plan Note (Signed)
Discussed importance of BMI reduction to improve overall health through increasing physical activity and nutrition. Patient's questions regarding nutritional supplements and weight loss answered. Goal weight loss of 5-10% of current body weight. Continue to monitor.

## 2015-06-07 NOTE — Progress Notes (Signed)
Pre visit review using our clinic review tool, if applicable. No additional management support is needed unless otherwise documented below in the visit note. 

## 2015-06-07 NOTE — Patient Instructions (Addendum)
Thank you for choosing [facility].  Summary/Instructions:  Please stop by the lab on the basement level of the building for your blood work. Your results will be released to [facility] (or called to you) after review, usually within 72 hours after test completion. If any changes need to be made, you will be notified at that same time.  If your symptoms worsen or fail to improve, please contact our office for further instruction, or in case of emergency go directly to the emergency room at the closest medical facility.    Health Maintenance  Topic Date Due  . Hepatitis C Screening  Aug 19, 1945  . PNA vac Low Risk Adult (2 of 2 - PCV13) 04/14/2015  . INFLUENZA VACCINE  12/06/2015  . TETANUS/TDAP  05/11/2020  . COLONOSCOPY  02/03/2023  . ZOSTAVAX  Completed   Health Maintenance, Male A healthy lifestyle and preventative care can promote health and wellness.  Maintain regular health, dental, and eye exams.  Eat a healthy diet. Foods like vegetables, fruits, whole grains, low-fat dairy products, and lean protein foods contain the nutrients you need and are low in calories. Decrease your intake of foods high in solid fats, added sugars, and salt. Get information about a proper diet from your health care provider, if necessary.  Regular physical exercise is one of the most important things you can do for your health. Most adults should get at least 150 minutes of moderate-intensity exercise (any activity that increases your heart rate and causes you to sweat) each week. In addition, most adults need muscle-strengthening exercises on 2 or more days a week.   Maintain a healthy weight. The body mass index (BMI) is a screening tool to identify possible weight problems. It provides an estimate of body fat based on height and weight. Your health care provider can find your BMI and can help you achieve or maintain a healthy weight. For males 20 years and older:  A BMI below 18.5 is considered  underweight.  A BMI of 18.5 to 24.9 is normal.  A BMI of 25 to 29.9 is considered overweight.  A BMI of 30 and above is considered obese.  Maintain normal blood lipids and cholesterol by exercising and minimizing your intake of saturated fat. Eat a balanced diet with plenty of fruits and vegetables. Blood tests for lipids and cholesterol should begin at age 33 and be repeated every 5 years. If your lipid or cholesterol levels are high, you are over age 17, or you are at high risk for heart disease, you may need your cholesterol levels checked more frequently.Ongoing high lipid and cholesterol levels should be treated with medicines if diet and exercise are not working.  If you smoke, find out from your health care provider how to quit. If you do not use tobacco, do not start.  Lung cancer screening is recommended for adults aged 76-80 years who are at high risk for developing lung cancer because of a history of smoking. A yearly low-dose CT scan of the lungs is recommended for people who have at least a 30-pack-year history of smoking and are current smokers or have quit within the past 15 years. A pack year of smoking is smoking an average of 1 pack of cigarettes a day for 1 year (for example, a 30-pack-year history of smoking could mean smoking 1 pack a day for 30 years or 2 packs a day for 15 years). Yearly screening should continue until the smoker has stopped smoking for at least  15 years. Yearly screening should be stopped for people who develop a health problem that would prevent them from having lung cancer treatment.  If you choose to drink alcohol, do not have more than 2 drinks per day. One drink is considered to be 12 oz (360 mL) of beer, 5 oz (150 mL) of wine, or 1.5 oz (45 mL) of liquor.  Avoid the use of street drugs. Do not share needles with anyone. Ask for help if you need support or instructions about stopping the use of drugs.  High blood pressure causes heart disease and  increases the risk of stroke. High blood pressure is more likely to develop in:  People who have blood pressure in the end of the normal range (100-139/85-89 mm Hg).  People who are overweight or obese.  People who are African American.  If you are 36-58 years of age, have your blood pressure checked every 3-5 years. If you are 8 years of age or older, have your blood pressure checked every year. You should have your blood pressure measured twice--once when you are at a hospital or clinic, and once when you are not at a hospital or clinic. Record the average of the two measurements. To check your blood pressure when you are not at a hospital or clinic, you can use:  An automated blood pressure machine at a pharmacy.  A home blood pressure monitor.  If you are 28-53 years old, ask your health care provider if you should take aspirin to prevent heart disease.  Diabetes screening involves taking a blood sample to check your fasting blood sugar level. This should be done once every 3 years after age 93 if you are at a normal weight and without risk factors for diabetes. Testing should be considered at a younger age or be carried out more frequently if you are overweight and have at least 1 risk factor for diabetes.  Colorectal cancer can be detected and often prevented. Most routine colorectal cancer screening begins at the age of 22 and continues through age 87. However, your health care provider may recommend screening at an earlier age if you have risk factors for colon cancer. On a yearly basis, your health care provider may provide home test kits to check for hidden blood in the stool. A small camera at the end of a tube may be used to directly examine the colon (sigmoidoscopy or colonoscopy) to detect the earliest forms of colorectal cancer. Talk to your health care provider about this at age 90 when routine screening begins. A direct exam of the colon should be repeated every 5-10 years through  age 25, unless early forms of precancerous polyps or small growths are found.  People who are at an increased risk for hepatitis B should be screened for this virus. You are considered at high risk for hepatitis B if:  You were born in a country where hepatitis B occurs often. Talk with your health care provider about which countries are considered high risk.  Your parents were born in a high-risk country and you have not received a shot to protect against hepatitis B (hepatitis B vaccine).  You have HIV or AIDS.  You use needles to inject street drugs.  You live with, or have sex with, someone who has hepatitis B.  You are a man who has sex with other men (MSM).  You get hemodialysis treatment.  You take certain medicines for conditions like cancer, organ transplantation, and autoimmune conditions.  Hepatitis C blood testing is recommended for all people born from [birth year] through 1963 and any individual with known risk factors for hepatitis C.  Healthy men should no longer receive prostate-specific antigen (PSA) blood tests as part of routine cancer screening. Talk to your health care provider about prostate cancer screening.  Testicular cancer screening is not recommended for adolescents or adult males who have no symptoms. Screening includes self-exam, a health care provider exam, and other screening tests. Consult with your health care provider about any symptoms you have or any concerns you have about testicular cancer.  Practice safe sex. Use condoms and avoid high-risk sexual practices to reduce the spread of sexually transmitted infections (STIs).  You should be screened for STIs, including gonorrhea and chlamydia if:  You are sexually active and are younger than 24 years.  You are older than 24 years, and your health care provider tells you that you are at risk for this type of infection.  Your sexual activity has changed since you were last screened, and you are at an  increased risk for chlamydia or gonorrhea. Ask your health care provider if you are at risk.  If you are at risk of being infected with HIV, it is recommended that you take a prescription medicine daily to prevent HIV infection. This is called pre-exposure prophylaxis (PrEP). You are considered at risk if:  You are a man who has sex with other men (MSM).  You are a heterosexual man who is sexually active with multiple partners.  You take drugs by injection.  You are sexually active with a partner who has HIV.  Talk with your health care provider about whether you are at high risk of being infected with HIV. If you choose to begin PrEP, you should first be tested for HIV. You should then be tested every 3 months for as long as you are taking PrEP.  Use sunscreen. Apply sunscreen liberally and repeatedly throughout the day. You should seek shade when your shadow is shorter than you. Protect yourself by wearing long sleeves, pants, a wide-brimmed hat, and sunglasses year round whenever you are outdoors.  Tell your health care provider of new moles or changes in moles, especially if there is a change in shape or color. Also, tell your health care provider if a mole is larger than the size of a pencil eraser.  A one-time screening for abdominal aortic aneurysm (AAA) and surgical repair of large AAAs by ultrasound is recommended for men aged 56-75 years who are current or former smokers.  Stay current with your vaccines (immunizations).   This information is not intended to replace advice given to you by your health care provider. Make sure you discuss any questions you have with your health care provider.   Document Released: 10/20/2007 Document Revised: 05/14/2014 Document Reviewed: 09/18/2010 Elsevier Interactive Patient Education [employer].

## 2015-06-10 ENCOUNTER — Other Ambulatory Visit (INDEPENDENT_AMBULATORY_CARE_PROVIDER_SITE_OTHER): Payer: Medicare Other

## 2015-06-10 DIAGNOSIS — I10 Essential (primary) hypertension: Secondary | ICD-10-CM

## 2015-06-10 DIAGNOSIS — E785 Hyperlipidemia, unspecified: Secondary | ICD-10-CM | POA: Diagnosis not present

## 2015-06-10 DIAGNOSIS — N400 Enlarged prostate without lower urinary tract symptoms: Secondary | ICD-10-CM

## 2015-06-10 DIAGNOSIS — Z0000 Encounter for general adult medical examination without abnormal findings: Secondary | ICD-10-CM | POA: Diagnosis not present

## 2015-06-10 LAB — COMPREHENSIVE METABOLIC PANEL
ALT: 24 U/L (ref 0–53)
AST: 18 U/L (ref 0–37)
Albumin: 4 g/dL (ref 3.5–5.2)
Alkaline Phosphatase: 37 U/L — ABNORMAL LOW (ref 39–117)
BUN: 16 mg/dL (ref 6–23)
CO2: 25 meq/L (ref 19–32)
Calcium: 9.3 mg/dL (ref 8.4–10.5)
Chloride: 107 mEq/L (ref 96–112)
Creatinine, Ser: 1.06 mg/dL (ref 0.40–1.50)
GFR: 73.96 mL/min (ref >60.00)
GLUCOSE: 127 mg/dL — AB (ref 70–99)
POTASSIUM: 4.3 meq/L (ref 3.5–5.1)
Sodium: 141 mEq/L (ref 135–145)
Total Bilirubin: 0.6 mg/dL (ref 0.2–1.2)
Total Protein: 6.7 g/dL (ref 6.0–8.3)

## 2015-06-10 LAB — LIPID PANEL
Cholesterol: 180 mg/dL (ref 0–200)
HDL: 42.2 mg/dL (ref >39.00)
NonHDL: 137.66
Total CHOL/HDL Ratio: 4
Triglycerides: 202 mg/dL — ABNORMAL HIGH (ref 0.0–149.0)
VLDL: 40.4 mg/dL — AB (ref 0.0–40.0)

## 2015-06-10 LAB — CBC
HEMATOCRIT: 45.5 % (ref 39.0–52.0)
HEMOGLOBIN: 15.6 g/dL (ref 13.0–17.0)
MCHC: 34.4 g/dL (ref 30.0–36.0)
MCV: 95.7 fl (ref 78.0–100.0)
Platelets: 231 10*3/uL (ref 150.0–400.0)
RBC: 4.75 Mil/uL (ref 4.22–5.81)
RDW: 12.7 % (ref 11.5–15.5)
WBC: 5.4 10*3/uL (ref 4.0–10.5)

## 2015-06-10 LAB — LDL CHOLESTEROL, DIRECT: LDL DIRECT: 103 mg/dL

## 2015-06-10 LAB — PSA: PSA: 0.84 ng/mL (ref 0.10–4.00)

## 2015-06-11 LAB — HEPATITIS C ANTIBODY: HCV AB: NEGATIVE

## 2015-06-13 ENCOUNTER — Encounter: Payer: Self-pay | Admitting: Family

## 2015-06-25 ENCOUNTER — Other Ambulatory Visit: Payer: Self-pay | Admitting: Family

## 2015-09-21 DIAGNOSIS — L570 Actinic keratosis: Secondary | ICD-10-CM | POA: Diagnosis not present

## 2015-09-21 DIAGNOSIS — D485 Neoplasm of uncertain behavior of skin: Secondary | ICD-10-CM | POA: Diagnosis not present

## 2015-09-21 DIAGNOSIS — D225 Melanocytic nevi of trunk: Secondary | ICD-10-CM | POA: Diagnosis not present

## 2015-09-21 DIAGNOSIS — L821 Other seborrheic keratosis: Secondary | ICD-10-CM | POA: Diagnosis not present

## 2015-09-21 DIAGNOSIS — D1801 Hemangioma of skin and subcutaneous tissue: Secondary | ICD-10-CM | POA: Diagnosis not present

## 2015-09-21 DIAGNOSIS — L304 Erythema intertrigo: Secondary | ICD-10-CM | POA: Diagnosis not present

## 2015-09-21 DIAGNOSIS — D3612 Benign neoplasm of peripheral nerves and autonomic nervous system, upper limb, including shoulder: Secondary | ICD-10-CM | POA: Diagnosis not present

## 2015-09-21 DIAGNOSIS — L565 Disseminated superficial actinic porokeratosis (DSAP): Secondary | ICD-10-CM | POA: Diagnosis not present

## 2015-09-26 NOTE — Progress Notes (Signed)
Patient ID: Edwin [name], male   DOB: Nov 06, 1945, 70 y.o.   MRN: [id number]     Cardiology Office Note   Date:  09/28/2015   ID:  Edwin [name], Edwin [name] 1945/07/16, MRN [id number]  PCP:  [name], FNP  Cardiologist:   Jenkins Rouge, MD   Chief Complaint  Patient presents with  . Hyperlipidemia      History of Present Illness: Edwin [name] is a 70 y.o. male initially seen for abnormal ECG 09/2014    Had ETT with PA in April Exercised for about 7 mintues Baseline ECG with RBBB with nonspecific ST changes  Should not have had treadmill with baseline abnormal ECG  As these were accentuated with stress No VT. CRF;s HTN on Rx including statin  Sleep apnea recently seen by Dr Halford Chessman  Wears CPAP Having twinges of SSCP lasting minutes  Not always exertional having for months  No associated palpitations syncope or dyspnea  Has had achilles issue and gained some weight Scared to start exercising more until CAD r/o given recent stress test results.    09/11/14 cardiac CT showed very high calcium score and moderate CAD  Reviewed IMPRESSION: 1) Calcium Score 1010 dense in proximal and mid LAD 91st percentile for age and sex matched controls  2) Moderate 50% proximal and mid LAD calcified disease and 50% ostial D3 disease No obstructive soft plaque in LM  Less than 50% calcified proximal and mid RCA disease  3) Given high calcium score consider f/u perfusion study  F/U perfusion study 12/01/14 reviewed:  EF 58%  Normal with no ischemia or infarction  Only taking lipitor once/week Feels sore Same thing with crestor  LDL 103 Discussed taking it qod     Past Medical History  Diagnosis Date  . Diverticulosis   . Gilbert syndrome   . Hyperglycemia   . Cancer ([place])     Basal cell of face, [name]  . Tubular adenoma of colon 01/19/08  . Hyperlipidemia     NMR Lipoprofile 2005; LDL 146 (2007/1330), HDL 38, TG 169.  LDL goal = <100; Framingham Study LDL goal =<130  . BPH (benign  prostatic hypertrophy)   . OSA on CPAP   . Hypertension     Past Surgical History  Procedure Laterality Date  . Tonsillectomy    . Knee arthroscopy      [name], left knee  . Colonoscopy w/ polypectomy  2004 & 2009    Tics; [facility]  . Wisdom tooth extraction    . Colonoscopy  2014    neg; [facility]     Current Outpatient Prescriptions  Medication Sig Dispense Refill  . aspirin 81 MG tablet Take 81 mg by mouth daily.      [name] atorvastatin (LIPITOR) 10 MG tablet Take 1 tablet (10 mg total) by mouth daily. 90 tablet 3  . Cholecalciferol (VITAMIN D3) 1000 UNITS CAPS Take 1 capsule by mouth daily.     . metoprolol succinate (TOPROL-XL) 50 MG 24 hr tablet Take 50 mg by mouth daily. Take with or immediately following a meal.    . Multiple Vitamin (MULTIVITAMIN) tablet Take 1 tablet by mouth daily.      . nitroGLYCERIN (NITROSTAT) 0.4 MG SL tablet Place 1 tablet (0.4 mg total) under the tongue every 5 (five) minutes as needed for chest pain. 25 tablet 5  . Omega-3 Fatty Acids (OMEGA 3 PO) Take 1 capsule by mouth daily.  No current facility-administered medications for this visit.    Allergies:   Review of patient's allergies indicates no known allergies.    Social History:  The patient  reports that he has never smoked. He has never used smokeless tobacco. He reports that he drinks alcohol. He reports that he does not use illicit drugs.   Family History:  The patient's family history includes Appendicitis in his brother and sister; Asthma in his brother; Cancer in his brother; Diabetes in his father; Diverticulosis in his mother; Heart attack (age of onset: 31) in his maternal grandfather; Heart attack (age of onset: 20) in his maternal grandmother; Heart disease (age of onset: 19) in his father; Hypertension in his father and sister; Parkinsonism in his maternal grandfather and mother; Stroke in his mother; Stroke (age of onset: 6) in his paternal grandmother. There  is no history of Colon cancer, Esophageal cancer, Rectal cancer, or Stomach cancer.    ROS:  Please see the history of present illness.   Otherwise, review of systems are positive for none.   All other systems are reviewed and negative.      PHYSICAL EXAM: VS:  BP 138/88 mmHg  Pulse 72  Ht 5\' 9"  (1.753 m)  Wt 122.38 kg (269 lb 12.8 oz)  BMI 39.82 kg/m2  SpO2 96% , BMI Body mass index is 39.82 kg/(m^2). Affect appropriate Healthy:  appears stated age 72: normal Neck supple with no adenopathy JVP normal no bruits no thyromegaly Lungs clear with no wheezing and good diaphragmatic motion Heart:  S1/S2 no murmur, no rub, gallop or click PMI normal Abdomen: benighn, BS positve, no tenderness, no AAA no bruit.  No HSM or HJR Distal pulses intact with no bruits No edema Neuro non-focal Skin warm and dry No muscular weakness    EKG:   06/02/14  SR rate 62 RBBB PAC;s 09/28/15  SR ate 72 PAC RBBB no changes    Recent Labs: 06/10/2015: ALT 24; BUN 16; Creatinine, Ser 1.06; Hemoglobin 15.6; Platelets 231.0; Potassium 4.3; Sodium 141    Lipid Panel    Component Value Date/Time   CHOL 180 06/10/2015 0749   CHOL 202* 06/02/2014 1116   TRIG 202.0* 06/10/2015 0749   TRIG 213* 06/02/2014 1116   TRIG 179* 05/13/2006 1239   HDL 42.20 06/10/2015 0749   HDL 44 06/02/2014 1116   CHOLHDL 4 06/10/2015 0749   CHOLHDL 4.0 CALC 05/13/2006 1239   VLDL 40.4* 06/10/2015 0749   LDLCALC 77 03/24/2015 1413   LDLCALC 115* 06/02/2014 1116   LDLDIRECT 103.0 06/10/2015 0749      Wt Readings from Last 3 Encounters:  09/28/15 122.38 kg (269 lb 12.8 oz)  06/07/15 122.471 kg (270 lb)  12/30/14 118.389 kg (261 lb)      Other studies Reviewed: Additional studies/ records that were reviewed today include: Epic notes  ETT by DR Linna Darner.     ASSESSMENT AND PLAN:  1. CAD: Moderate LAD/Circ disease by cardiac CT normal perfusion study continue risk factor modification and medical Rx 2. Chol:   Will take qod to get to target CoQ supplement f/u labs 3 months  Cholesterol is at goal.  Continue current dose of statin and diet Rx.  No myalgias or side effects.  F/U  LFT's in 6 months. Lab Results  Component Value Date   LDLCALC 77 03/24/2015             3. RBBB:  No change on ECG yearly ECG  4. PAC;s  Benign asymptomatic on beta blocker    Current medicines are reviewed at length with the patient today.  The patient does not have concerns regarding medicines.  The following changes have been made:  no change  Labs/ tests ordered today Lipid and Liver 3 months   No orders of the defined types were placed in this encounter.     Disposition:   FU with me  6 months     Signed, Jenkins Rouge, MD  09/28/2015 10:16 AM    [facility] [address], [place], [place]  [address] Phone: [phone]; Fax: [phone]

## 2015-09-28 ENCOUNTER — Ambulatory Visit (INDEPENDENT_AMBULATORY_CARE_PROVIDER_SITE_OTHER): Payer: Medicare Other | Admitting: Cardiovascular Disease

## 2015-09-28 ENCOUNTER — Encounter: Payer: Self-pay | Admitting: Cardiovascular Disease

## 2015-09-28 VITALS — BP 138/88 | HR 72 | Ht 69.0 in | Wt 269.8 lb

## 2015-09-28 DIAGNOSIS — E785 Hyperlipidemia, unspecified: Secondary | ICD-10-CM | POA: Diagnosis not present

## 2015-09-28 DIAGNOSIS — Z79899 Other long term (current) drug therapy: Secondary | ICD-10-CM

## 2015-09-28 DIAGNOSIS — I2510 Atherosclerotic heart disease of native coronary artery without angina pectoris: Secondary | ICD-10-CM

## 2015-09-28 DIAGNOSIS — R931 Abnormal findings on diagnostic imaging of heart and coronary circulation: Secondary | ICD-10-CM

## 2015-09-28 MED ORDER — ATORVASTATIN CALCIUM 10 MG PO TABS: 10.0000 mg | ORAL_TABLET | ORAL | Status: DC

## 2015-09-28 NOTE — Patient Instructions (Addendum)
Medication Instructions:  Your physician has recommended you make the following change in your medication:  1-Take Lipitor 10 mg by mouth every other day  Labwork: Your physician recommends that you return for lab work in 3 months for lipid and liver panel same day as office visit. Please be fasting for these labs. Nothing to eat or drink after midnight.   Testing/Procedures: NONE  Follow-Up: Your physician wants you to follow-up in: 3 months with Dr. [name] Cancel.    If you need a refill on your cardiac medications before your next appointment, please call your pharmacy.

## 2015-10-13 ENCOUNTER — Telehealth: Payer: Self-pay

## 2015-10-13 NOTE — Telephone Encounter (Signed)
Left message for patient to call back  

## 2015-10-13 NOTE — Telephone Encounter (Signed)
-----   Message from Edwin [name] sent at 09/28/2015 10:26 AM EDT ----- Regarding: 3 month f/u  Contact: [phone] Pt needs a 3 month f/u with [name] Cancel but is out of town 08/24 week. Please locate me a appt i will be happy to call him.  I will also schedule the lab for 3 month same day as visit for fasting labs.   Davy Pique

## 2015-10-14 NOTE — Telephone Encounter (Signed)
Left message for patient to call back  

## 2015-10-14 NOTE — Telephone Encounter (Signed)
Patient will come in earlier for his lab work on 12/28/15.

## 2015-10-14 NOTE — Telephone Encounter (Signed)
Follow Up   Pt returned call. Made appt for 12/28/2015 @ 11:30a. Pt request a call back to determine if labs are needed. Please call

## 2015-11-18 ENCOUNTER — Other Ambulatory Visit: Payer: Self-pay

## 2015-11-18 MED ORDER — METOPROLOL SUCCINATE ER 50 MG PO TB24: 50.0000 mg | ORAL_TABLET | Freq: Every day | ORAL | Status: DC

## 2015-12-14 ENCOUNTER — Encounter: Payer: Self-pay | Admitting: Cardiovascular Disease

## 2015-12-25 NOTE — Progress Notes (Signed)
Patient ID: Edwin [name], male   DOB: July 17, 1945, 70 y.o.   MRN: [id number]     Cardiology Office Note   Date:  12/28/2015   ID:  Edwin, [name] Oct 05, 1945, MRN [id number]  PCP:  [name], FNP  Cardiologist:   Jenkins Rouge, MD   Chief Complaint  Patient presents with  . medication managment    some chest twing, none today, per pt      History of Present Illness: Edwin [name] is a 70 y.o. male initially seen for abnormal ECG 09/2014    Had ETT with PA in April Exercised for about 7 mintues Baseline ECG with RBBB with nonspecific ST changes  Should not have had treadmill with baseline abnormal ECG  As these were accentuated with stress No VT. CRF;s HTN on Rx including statin  Sleep apnea recently seen by Dr Halford Chessman  Wears CPAP Having twinges of SSCP lasting minutes  Not always exertional having for months  No associated palpitations syncope or dyspnea  Has had achilles issue and gained some weight Scared to start exercising more until CAD r/o given recent stress test results.    09/11/14 cardiac CT showed very high calcium score and moderate CAD  Reviewed IMPRESSION: 1) Calcium Score 1010 dense in proximal and mid LAD 91st percentile for age and sex matched controls  2) Moderate 50% proximal and mid LAD calcified disease and 50% ostial D3 disease No obstructive soft plaque in LM  Less than 50% calcified proximal and mid RCA disease  3) Given high calcium score consider f/u perfusion study  F/U perfusion study 12/01/14 reviewed:  EF 58%  Normal with no ischemia or infarction   Starting taking statin 3 x / week and had labs this am  BP up Discussed exercise and weight loss He is going to [facility] down time    Past Medical History:  Diagnosis Date  . BPH (benign prostatic hypertrophy)   . Cancer ([place])    Basal cell of face, [name]  . Diverticulosis   . Gilbert syndrome   . Hyperglycemia   . Hyperlipidemia    NMR Lipoprofile 2005; LDL 146 (2007/1330), HDL 38,  TG 169.  LDL goal = <100; Framingham Study LDL goal =<130  . Hypertension   . OSA on CPAP   . Tubular adenoma of colon 01/19/08    Past Surgical History:  Procedure Laterality Date  . COLONOSCOPY  2014   neg; [facility]  . COLONOSCOPY W/ POLYPECTOMY  2004 & 2009   Tics; [facility]  . KNEE ARTHROSCOPY     [name], left knee  . TONSILLECTOMY    . WISDOM TOOTH EXTRACTION       Current Outpatient Prescriptions  Medication Sig Dispense Refill  . aspirin 81 MG tablet Take 81 mg by mouth daily.      [name] atorvastatin (LIPITOR) 10 MG tablet Take 1 tablet (10 mg total) by mouth every other day. 90 tablet 3  . Cholecalciferol (VITAMIN D3) 1000 UNITS CAPS Take 1 capsule by mouth daily.     . metoprolol succinate (TOPROL-XL) 50 MG 24 hr tablet Take 1 tablet (50 mg total) by mouth daily. Take with or immediately following a meal. 90 tablet 1  . Multiple Vitamin (MULTIVITAMIN) tablet Take 1 tablet by mouth daily.      . nitroGLYCERIN (NITROSTAT) 0.4 MG SL tablet Place 1 tablet (0.4 mg total) under the tongue every 5 (five) minutes as needed for chest pain.  25 tablet 5  . Omega-3 Fatty Acids (OMEGA 3 PO) Take 1 capsule by mouth daily.      No current facility-administered medications for this visit.     Allergies:   Review of patient's allergies indicates no known allergies.    Social History:  The patient  reports that he has never smoked. He has never used smokeless tobacco. He reports that he drinks alcohol. He reports that he does not use drugs.   Family History:  The patient's family history includes Appendicitis in his brother and sister; Asthma in his brother; Cancer in his brother; Diabetes in his father; Diverticulosis in his mother; Heart attack (age of onset: 77) in his maternal grandfather; Heart attack (age of onset: 29) in his maternal grandmother; Heart disease (age of onset: 77) in his father; Hypertension in his father and sister; Parkinsonism in his maternal  grandfather and mother; Stroke in his mother; Stroke (age of onset: 40) in his paternal grandmother.    ROS:  Please see the history of present illness.   Otherwise, review of systems are positive for none.   All other systems are reviewed and negative.      PHYSICAL EXAM: VS:  BP (!) 152/80 (BP Location: Left Arm, Patient Position: Sitting, Cuff Size: Large)   Pulse 63   Ht 5\' 9"  (1.753 m)   Wt 263 lb 6.4 oz (119.5 kg)   SpO2 95%   BMI 38.90 kg/m  , BMI Body mass index is 38.9 kg/m. Affect appropriate Healthy:  appears stated age 37: normal Neck supple with no adenopathy JVP normal no bruits no thyromegaly Lungs clear with no wheezing and good diaphragmatic motion Heart:  S1/S2 no murmur, no rub, gallop or click PMI normal Abdomen: benighn, BS positve, no tenderness, no AAA no bruit.  No HSM or HJR Distal pulses intact with no bruits No edema Neuro non-focal Skin warm and dry No muscular weakness    EKG:   06/02/14  SR rate 62 RBBB PAC;s 09/28/15  SR ate 72 PAC RBBB no changes    Recent Labs: 06/10/2015: ALT 24; BUN 16; Creatinine, Ser 1.06; Hemoglobin 15.6; Platelets 231.0; Potassium 4.3; Sodium 141    Lipid Panel    Component Value Date/Time   CHOL 180 06/10/2015 0749   CHOL 202 (H) 06/02/2014 1116   TRIG 202.0 (H) 06/10/2015 0749   TRIG 213 (H) 06/02/2014 1116   TRIG 179 (H) 05/13/2006 1239   HDL 42.20 06/10/2015 0749   HDL 44 06/02/2014 1116   CHOLHDL 4 06/10/2015 0749   VLDL 40.4 (H) 06/10/2015 0749   LDLCALC 77 03/24/2015 1413   LDLCALC 115 (H) 06/02/2014 1116   LDLDIRECT 103.0 06/10/2015 0749      Wt Readings from Last 3 Encounters:  12/28/15 263 lb 6.4 oz (119.5 kg)  09/28/15 269 lb 12.8 oz (122.4 kg)  06/07/15 270 lb (122.5 kg)      Other studies Reviewed: Additional studies/ records that were reviewed today include: Epic notes  ETT by DR Linna Darner.     ASSESSMENT AND PLAN:  1. CAD: Moderate LAD/Circ disease by cardiac CT normal  perfusion study continue risk factor modification and medical Rx 2. Chol:  Repeat labs pending on higher dose statin  Lab Results  Component Value Date   LDLCALC 77 03/24/2015    3. RBBB:  No change on ECG yearly ECG  4. PAC;s  Benign asymptomatic on beta blocker  5. BP high in office he will check at home  and f/u with me in a few weeks. Clearly if he lost 10 lbs He would not need any more meds. Will start taking Toprol in am     Disposition:   FU with me 3 months    Jenkins Rouge

## 2015-12-28 ENCOUNTER — Encounter: Payer: Self-pay | Admitting: Cardiovascular Disease

## 2015-12-28 ENCOUNTER — Ambulatory Visit (INDEPENDENT_AMBULATORY_CARE_PROVIDER_SITE_OTHER): Payer: Medicare Other | Admitting: Cardiovascular Disease

## 2015-12-28 ENCOUNTER — Other Ambulatory Visit: Payer: Medicare Other | Admitting: *Deleted

## 2015-12-28 VITALS — BP 152/80 | HR 63 | Ht 69.0 in | Wt 263.4 lb

## 2015-12-28 DIAGNOSIS — R931 Abnormal findings on diagnostic imaging of heart and coronary circulation: Secondary | ICD-10-CM

## 2015-12-28 DIAGNOSIS — E785 Hyperlipidemia, unspecified: Secondary | ICD-10-CM

## 2015-12-28 DIAGNOSIS — I2510 Atherosclerotic heart disease of native coronary artery without angina pectoris: Secondary | ICD-10-CM

## 2015-12-28 DIAGNOSIS — Z79899 Other long term (current) drug therapy: Secondary | ICD-10-CM

## 2015-12-28 LAB — LIPID PANEL
Cholesterol: 136 mg/dL (ref 125–200)
HDL: 43 mg/dL (ref >=40)
LDL Cholesterol: 66 mg/dL (ref <130)
Total CHOL/HDL Ratio: 3.2 Ratio (ref <=5.0)
Triglycerides: 134 mg/dL (ref <150)
VLDL: 27 mg/dL (ref <30)

## 2015-12-28 LAB — HEPATIC FUNCTION PANEL
ALBUMIN: 4 g/dL (ref 3.6–5.1)
ALT: 22 U/L (ref 9–46)
AST: 17 U/L (ref 10–35)
Alkaline Phosphatase: 33 U/L — ABNORMAL LOW (ref 40–115)
BILIRUBIN DIRECT: 0.1 mg/dL (ref <=0.2)
Indirect Bilirubin: 0.5 mg/dL (ref 0.2–1.2)
TOTAL PROTEIN: 6.7 g/dL (ref 6.1–8.1)
Total Bilirubin: 0.6 mg/dL (ref 0.2–1.2)

## 2015-12-28 NOTE — Patient Instructions (Addendum)
Medication Instructions:  Your physician recommends that you continue on your current medications as directed. Please refer to the Current Medication list given to you today.  Labwork: NONE  Testing/Procedures: NONE  Follow-Up: Your physician wants you to follow-up in: 3 months with Dr. Nishan.   If you need a refill on your cardiac medications before your next appointment, please call your pharmacy.    

## 2015-12-29 ENCOUNTER — Telehealth: Payer: Self-pay

## 2015-12-29 DIAGNOSIS — Z79899 Other long term (current) drug therapy: Secondary | ICD-10-CM

## 2015-12-29 NOTE — Telephone Encounter (Signed)
Patient aware of lab results. Patient would like to include lipid and liver panel in his lab work from his PCP in January. Will forward to patient's PCP.

## 2015-12-29 NOTE — Telephone Encounter (Signed)
-----   Message from Josue Hector, MD sent at 12/29/2015 11:52 AM EDT ----- Cholesterol is at goal and LFT's are normal F/U labs in 6 months

## 2015-12-29 NOTE — Telephone Encounter (Signed)
Left message for patient to call back. Patient will need an appointment for lab work in 6 months.

## 2016-01-02 DIAGNOSIS — Z23 Encounter for immunization: Secondary | ICD-10-CM | POA: Diagnosis not present

## 2016-01-16 ENCOUNTER — Other Ambulatory Visit: Payer: Self-pay | Admitting: Cardiovascular Disease

## 2016-01-16 DIAGNOSIS — R931 Abnormal findings on diagnostic imaging of heart and coronary circulation: Secondary | ICD-10-CM

## 2016-01-16 DIAGNOSIS — E785 Hyperlipidemia, unspecified: Secondary | ICD-10-CM

## 2016-02-09 ENCOUNTER — Ambulatory Visit (INDEPENDENT_AMBULATORY_CARE_PROVIDER_SITE_OTHER): Payer: Medicare Other | Admitting: Internal Medicine

## 2016-02-09 ENCOUNTER — Encounter: Payer: Self-pay | Admitting: Internal Medicine

## 2016-02-09 VITALS — BP 120/76 | HR 82 | Temp 98.9°F | Resp 20 | Wt 261.0 lb

## 2016-02-09 DIAGNOSIS — R931 Abnormal findings on diagnostic imaging of heart and coronary circulation: Secondary | ICD-10-CM

## 2016-02-09 DIAGNOSIS — J029 Acute pharyngitis, unspecified: Secondary | ICD-10-CM

## 2016-02-09 DIAGNOSIS — R739 Hyperglycemia, unspecified: Secondary | ICD-10-CM

## 2016-02-09 DIAGNOSIS — I10 Essential (primary) hypertension: Secondary | ICD-10-CM

## 2016-02-09 MED ORDER — AZITHROMYCIN 250 MG PO TABS: ORAL_TABLET | ORAL | 1 refills | Status: DC

## 2016-02-09 NOTE — Patient Instructions (Signed)
Please take all new medication as prescribed - the antibiotic  Please continue all other medications as before, and refills have been done if requested.  Please have the pharmacy call with any other refills you may need.  Please keep your appointments with your specialists as you may have planned   

## 2016-02-09 NOTE — Assessment & Plan Note (Signed)
Mild to mod, for antibx course,  to f/u any worsening symptoms or concerns 

## 2016-02-09 NOTE — Assessment & Plan Note (Signed)
stable overall by history and exam, recent data reviewed with pt, and pt to continue medical treatment as before,  to f/u any worsening symptoms or concerns Lab Results  Component Value Date   HGBA1C 6.0 06/02/2014   Pt to call for onset polys or cbg > 200

## 2016-02-09 NOTE — Progress Notes (Signed)
Subjective:    Patient ID: DRAGON STAGG, male    DOB: 11-12-1945, 70 y.o.   MRN: [id number]  HPI   Here with 2-3 days acute onset fever, severe ST pain, pressure, headache, general weakness and malaise, and greenish d/c, with mild ST and cough, but pt denies chest pain, wheezing, increased sob or doe, orthopnea, PND, increased LE swelling, palpitations, dizziness or syncope.  Grandson with recently dx strep throat on antibx. Pt denies new neurological symptoms such as new headache, or facial or extremity weakness or numbness   Pt denies polydipsia, polyuria Past Medical History:  Diagnosis Date  . BPH (benign prostatic hypertrophy)   . Cancer ([place])    Basal cell of face, [name]  . Diverticulosis   . Gilbert syndrome   . Hyperglycemia   . Hyperlipidemia    NMR Lipoprofile 2005; LDL 146 (2007/1330), HDL 38, TG 169.  LDL goal = <100; Framingham Study LDL goal =<130  . Hypertension   . OSA on CPAP   . Tubular adenoma of colon 01/19/08   Past Surgical History:  Procedure Laterality Date  . COLONOSCOPY  2014   neg; [facility]  . COLONOSCOPY W/ POLYPECTOMY  2004 & 2009   Tics; [facility]  . KNEE ARTHROSCOPY     [name], left knee  . TONSILLECTOMY    . WISDOM TOOTH EXTRACTION      reports that he has never smoked. He has never used smokeless tobacco. He reports that he drinks alcohol. He reports that he does not use drugs. family history includes Appendicitis in his brother and sister; Asthma in his brother; Cancer in his brother; Diabetes in his father; Diverticulosis in his mother; Heart attack (age of onset: 82) in his maternal grandfather; Heart attack (age of onset: 24) in his maternal grandmother; Heart disease (age of onset: 37) in his father; Hypertension in his father and sister; Parkinsonism in his maternal grandfather and mother; Stroke in his mother; Stroke (age of onset: 70) in his paternal grandmother. No Known Allergies Current Outpatient Prescriptions  on File Prior to Visit  Medication Sig Dispense Refill  . aspirin 81 MG tablet Take 81 mg by mouth daily.      [name] atorvastatin (LIPITOR) 10 MG tablet Take 1 tablet (10 mg total) by mouth daily. 90 tablet 3  . Cholecalciferol (VITAMIN D3) 1000 UNITS CAPS Take 1 capsule by mouth daily.     . metoprolol succinate (TOPROL-XL) 50 MG 24 hr tablet Take 1 tablet (50 mg total) by mouth daily. Take with or immediately following a meal. 90 tablet 1  . Multiple Vitamin (MULTIVITAMIN) tablet Take 1 tablet by mouth daily.      . nitroGLYCERIN (NITROSTAT) 0.4 MG SL tablet Place 1 tablet (0.4 mg total) under the tongue every 5 (five) minutes as needed for chest pain. 25 tablet 5  . Omega-3 Fatty Acids (OMEGA 3 PO) Take 1 capsule by mouth daily.      No current facility-administered medications on file prior to visit.    Review of Systems  Constitutional: Negative for unusual diaphoresis or night sweats HENT: Negative for ear swelling or discharge Eyes: Negative for worsening visual haziness  Respiratory: Negative for choking and stridor.   Gastrointestinal: Negative for distension or worsening eructation Genitourinary: Negative for retention or change in urine volume.  Musculoskeletal: Negative for other MSK pain or swelling Skin: Negative for color change and worsening wound Neurological: Negative for tremors and numbness other than noted  Psychiatric/Behavioral: Negative for decreased concentration or agitation other than above       Objective:   Physical Exam BP 120/76   Pulse 82   Temp 98.9 F (37.2 C) (Oral)   Resp 20   Wt 261 lb (118.4 kg)   SpO2 97%   BMI 38.54 kg/m  VS noted, mild ill appaering Constitutional: Pt appears in no apparent distress HENT: Head: NCAT.  Right Ear: External ear normal.  Left Ear: External ear normal.  Eyes: . Pupils are equal, round, and reactive to light. Conjunctivae and EOM are normal Bilat tm's with mild erythema.  Max sinus areas non tender.  Pharynx  with severe erythema, no exudate Neck: Normal range of motion. Neck supple. with bilat submandib LA Cardiovascular: Normal rate and regular rhythm.   Pulmonary/Chest: Effort normal and breath sounds without rales or wheezing.  Abd:  Soft, NT, ND, + BS Neurological: Pt is alert. Not confused , motor grossly intact Skin: Skin is warm. No rash, no LE edema Psychiatric: Pt behavior is normal. No agitation.     Assessment & Plan:

## 2016-02-09 NOTE — Progress Notes (Signed)
Pre visit review using our clinic review tool, if applicable. No additional management support is needed unless otherwise documented below in the visit note. 

## 2016-02-09 NOTE — Assessment & Plan Note (Signed)
stable overall by history and exam, recent data reviewed with pt, and pt to continue medical treatment as before,  to f/u any worsening symptoms or concerns BP Readings from Last 3 Encounters:  02/09/16 120/76  12/28/15 (!) 152/80  09/28/15 138/88

## 2016-03-26 NOTE — Progress Notes (Signed)
Patient ID: Edwin [name], male   DOB: [birth year], 70 y.o.   MRN: [id number]     Cardiology Office Note   Date:  03/28/2016   ID:  Edwin, [name] 05-18-1945, MRN [id number]  PCP:  [name], FNP  Cardiologist:   Jenkins Rouge, MD   Chief Complaint  Patient presents with  . Medication Managment    also f/u to elevated BP      History of Present Illness: Edwin [name] is a 70 y.o. male initially seen for abnormal ECG 09/2014    Had ETT with PA in April Exercised for about 7 mintues Baseline ECG with RBBB with nonspecific ST changes  Should not have had treadmill with baseline abnormal ECG  As these were accentuated with stress No VT. CRF;s HTN on Rx including statin  Sleep apnea recently seen by Dr Halford Chessman  Wears CPAP Having twinges of SSCP lasting minutes  Not always exertional having for months  No associated palpitations syncope or dyspnea  Has had achilles issue and gained some weight Scared to start exercising more until CAD r/o given recent stress test results.    09/11/14 cardiac CT showed very high calcium score and moderate CAD  Reviewed IMPRESSION: 1) Calcium Score 1010 dense in proximal and mid LAD 91st percentile for age and sex matched controls  2) Moderate 50% proximal and mid LAD calcified disease and 50% ostial D3 disease No obstructive soft plaque in LM  Less than 50% calcified proximal and mid RCA disease  3) Given high calcium score consider f/u perfusion study  F/U perfusion study 12/01/14 reviewed:  EF 58%  Normal with no ischemia or infarction   Starting taking statin 3 x / week and had labs this am  BP up Discussed exercise and weight loss He is going to [facility] down time  Started taking Toprol in am last visit. Home readings reviewed and bettter    Past Medical History:  Diagnosis Date  . BPH (benign prostatic hypertrophy)   . Cancer ([place])    Basal cell of face, [name]  . Diverticulosis   . Gilbert syndrome   . Hyperglycemia   .  Hyperlipidemia    NMR Lipoprofile 2005; LDL 146 (2007/1330), HDL 38, TG 169.  LDL goal = <100; Framingham Study LDL goal =<130  . Hypertension   . OSA on CPAP   . Tubular adenoma of colon 01/19/08    Past Surgical History:  Procedure Laterality Date  . COLONOSCOPY  2014   neg; [facility]  . COLONOSCOPY W/ POLYPECTOMY  2004 & 2009   Tics; [facility]  . KNEE ARTHROSCOPY     [name], left knee  . TONSILLECTOMY    . WISDOM TOOTH EXTRACTION       Current Outpatient Prescriptions  Medication Sig Dispense Refill  . aspirin 81 MG tablet Take 81 mg by mouth daily.      [name] atorvastatin (LIPITOR) 10 MG tablet Take 1 tablet (10 mg total) by mouth daily. 90 tablet 3  . Cholecalciferol (VITAMIN D3) 1000 UNITS CAPS Take 1 capsule by mouth daily.     . metoprolol succinate (TOPROL-XL) 50 MG 24 hr tablet Take 1 tablet (50 mg total) by mouth daily. Take with or immediately following a meal. 90 tablet 1  . Multiple Vitamin (MULTIVITAMIN) tablet Take 1 tablet by mouth daily.      . nitroGLYCERIN (NITROSTAT) 0.4 MG SL tablet Place 1 tablet (0.4 mg total) under the tongue  every 5 (five) minutes as needed for chest pain. 25 tablet 5  . Omega-3 Fatty Acids (OMEGA 3 PO) Take 1 capsule by mouth daily.      No current facility-administered medications for this visit.     Allergies:   Patient has no known allergies.    Social History:  The patient  reports that he has never smoked. He has never used smokeless tobacco. He reports that he drinks alcohol. He reports that he does not use drugs.   Family History:  The patient's family history includes Appendicitis in his brother and sister; Asthma in his brother; Cancer in his brother; Diabetes in his father; Diverticulosis in his mother; Heart attack (age of onset: 22) in his maternal grandfather; Heart attack (age of onset: 2) in his maternal grandmother; Heart disease (age of onset: 9) in his father; Hypertension in his father and sister;  Parkinsonism in his maternal grandfather and mother; Stroke in his mother; Stroke (age of onset: 47) in his paternal grandmother.    ROS:  Please see the history of present illness.   Otherwise, review of systems are positive for none.   All other systems are reviewed and negative.      PHYSICAL EXAM: VS:  BP 130/64   Pulse 71   Ht 5\' 9"  (1.753 m)   Wt 119.2 kg (262 lb 12.8 oz)   SpO2 97%   BMI 38.81 kg/m  , BMI Body mass index is 38.81 kg/m. Affect appropriate Healthy:  appears stated age 67: normal Neck supple with no adenopathy JVP normal no bruits no thyromegaly Lungs clear with no wheezing and good diaphragmatic motion Heart:  S1/S2 no murmur, no rub, gallop or click PMI normal Abdomen: benighn, BS positve, no tenderness, no AAA no bruit.  No HSM or HJR Distal pulses intact with no bruits No edema Neuro non-focal Skin warm and dry No muscular weakness    EKG:   06/02/14  SR rate 62 RBBB PAC;s 09/28/15  SR ate 72 PAC RBBB no changes    Recent Labs: 06/10/2015: BUN 16; Creatinine, Ser 1.06; Hemoglobin 15.6; Platelets 231.0; Potassium 4.3; Sodium 141 12/28/2015: ALT 22    Lipid Panel    Component Value Date/Time   CHOL 136 12/28/2015 0825   CHOL 202 (H) 06/02/2014 1116   TRIG 134 12/28/2015 0825   TRIG 213 (H) 06/02/2014 1116   TRIG 179 (H) 05/13/2006 1239   HDL 43 12/28/2015 0825   HDL 44 06/02/2014 1116   CHOLHDL 3.2 12/28/2015 0825   VLDL 27 12/28/2015 0825   LDLCALC 66 12/28/2015 0825   LDLCALC 115 (H) 06/02/2014 1116   LDLDIRECT 103.0 06/10/2015 0749      Wt Readings from Last 3 Encounters:  03/28/16 119.2 kg (262 lb 12.8 oz)  02/09/16 118.4 kg (261 lb)  12/28/15 119.5 kg (263 lb 6.4 oz)      Other studies Reviewed: Additional studies/ records that were reviewed today include: Epic notes  ETT by DR Linna Darner. myovue and cardiac CT 2016      ASSESSMENT AND PLAN:  1. CAD: Moderate LAD/Circ disease by cardiac CT normal perfusion study   11/30/14 continue risk factor modification and medical Rx will plan stress test July 2018  2. Chol:  Repeat labs pending on higher dose statin  Lab Results  Component Value Date   LDLCALC 66 12/28/2015    3. RBBB:  No change on ECG yearly ECG  4. PAC;s  Benign asymptomatic on beta blocker  5.  BP better continue current meds      Disposition:   FU with me July 2018    Jenkins Rouge

## 2016-03-28 ENCOUNTER — Encounter: Payer: Self-pay | Admitting: Cardiovascular Disease

## 2016-03-28 ENCOUNTER — Ambulatory Visit (INDEPENDENT_AMBULATORY_CARE_PROVIDER_SITE_OTHER): Payer: Medicare Other | Admitting: Cardiovascular Disease

## 2016-03-28 VITALS — BP 130/64 | HR 71 | Ht 69.0 in | Wt 262.8 lb

## 2016-03-28 DIAGNOSIS — Z79899 Other long term (current) drug therapy: Secondary | ICD-10-CM

## 2016-03-28 DIAGNOSIS — R931 Abnormal findings on diagnostic imaging of heart and coronary circulation: Secondary | ICD-10-CM

## 2016-03-28 NOTE — Patient Instructions (Signed)
Medication Instructions:  Your physician recommends that you continue on your current medications as directed. Please refer to the Current Medication list given to you today.   Labwork: None  Testing/Procedures: None  Follow-Up: Your physician wants you to follow-up in: July 2018. You will receive a reminder letter in the mail two months in advance. If you don't receive a letter, please call our office to schedule the follow-up appointment.   Any Other Special Instructions Will Be Listed Below (If Applicable).     If you need a refill on your cardiac medications before your next appointment, please call your pharmacy.

## 2016-05-09 ENCOUNTER — Other Ambulatory Visit: Payer: Self-pay

## 2016-05-09 MED ORDER — NITROGLYCERIN 0.4 MG SL SUBL: 0.4000 mg | SUBLINGUAL_TABLET | SUBLINGUAL | 5 refills | Status: DC | PRN

## 2016-06-27 ENCOUNTER — Other Ambulatory Visit: Payer: Self-pay | Admitting: Family

## 2016-07-10 ENCOUNTER — Ambulatory Visit (INDEPENDENT_AMBULATORY_CARE_PROVIDER_SITE_OTHER): Payer: Medicare Other | Admitting: Family

## 2016-07-10 ENCOUNTER — Encounter: Payer: Self-pay | Admitting: Family

## 2016-07-10 VITALS — BP 128/88 | HR 59 | Temp 98.1°F | Resp 16 | Ht 69.0 in | Wt 266.4 lb

## 2016-07-10 DIAGNOSIS — Z125 Encounter for screening for malignant neoplasm of prostate: Secondary | ICD-10-CM

## 2016-07-10 DIAGNOSIS — R739 Hyperglycemia, unspecified: Secondary | ICD-10-CM | POA: Diagnosis not present

## 2016-07-10 DIAGNOSIS — Z6839 Body mass index (BMI) 39.0-39.9, adult: Secondary | ICD-10-CM

## 2016-07-10 DIAGNOSIS — I10 Essential (primary) hypertension: Secondary | ICD-10-CM | POA: Diagnosis not present

## 2016-07-10 DIAGNOSIS — G4733 Obstructive sleep apnea (adult) (pediatric): Secondary | ICD-10-CM | POA: Diagnosis not present

## 2016-07-10 DIAGNOSIS — E669 Obesity, unspecified: Secondary | ICD-10-CM | POA: Diagnosis not present

## 2016-07-10 DIAGNOSIS — E782 Mixed hyperlipidemia: Secondary | ICD-10-CM | POA: Diagnosis not present

## 2016-07-10 DIAGNOSIS — Z0000 Encounter for general adult medical examination without abnormal findings: Secondary | ICD-10-CM | POA: Diagnosis not present

## 2016-07-10 DIAGNOSIS — IMO0001 Reserved for inherently not codable concepts without codable children: Secondary | ICD-10-CM

## 2016-07-10 NOTE — Assessment & Plan Note (Signed)
Sleep apnea appears stable and currently maintained on CPAP and managed by pulmonology. Discussed importance of continued CPAP usage to decreased risk increased risk for cardiovascular disease in the future.

## 2016-07-10 NOTE — Assessment & Plan Note (Signed)
Blood pressure well controlled with current medication regimen and below goal 140/90. Denies worst headache of life with no symptoms of end organ damage noted on physical exam. Continue current dosage of metoprolol. Encouraged to monitor blood pressure at home and follow low-sodium diet. Continue to monitor.

## 2016-07-10 NOTE — Assessment & Plan Note (Signed)
Previously noted to have hyperglycemia and prediabetes with most recent A1c of 6.0. Obtain A1c. Continue with lifestyle management pending A1c results.

## 2016-07-10 NOTE — Assessment & Plan Note (Signed)
BMI of 39. Recommend weight loss of 5-10% of current body weight. Recommend increasing physical activity to 30 minutes of moderate level activity daily. Encourage nutritional intake that focuses on nutrient dense foods and is moderate, varied, and balanced and is low in saturated fats and processed/sugary foods. Continue to monitor.   

## 2016-07-10 NOTE — Assessment & Plan Note (Signed)
Most recent lipid profile reviewed and appears well controlled with current medication regimen and no adverse side effects. Obtain lipid profile and continue current dosage of atorvastatin pending lipid profile results.

## 2016-07-10 NOTE — Progress Notes (Signed)
Subjective:    Patient ID: Edwin [name], male    DOB: 1946/01/23, 71 y.o.   MRN: [id number]  Chief Complaint  Patient presents with  . Medicare Wellness    not fasting    HPI:  Edwin [name] is a 71 y.o. male who presents today for a Medicare Annual Wellness/Physical exam.    1) Health Maintenance -   Diet - Averages about 3 meals per day consisting of a regular diet; Caffeine intake of  3-4 cups per day  Exercise - 3-4 times per week including bike and ellipitical   2) Preventative Exams / Immunizations:  Dental --Up to date  Vision -- Up to date   Health Maintenance  Topic Date Due  . TETANUS/TDAP  05/11/2020  . COLONOSCOPY  02/03/2023  . INFLUENZA VACCINE  Addressed  . Hepatitis C Screening  Completed  . PNA vac Low Risk Adult  Completed     Immunization History  Administered Date(s) Administered  . Influenza, High Dose Seasonal PF 05/22/2013  . Influenza, Seasonal, Injecte, Preservative Fre 05/20/2012  . Influenza,inj,Quad PF,36+ Mos 03/02/2014, 05/19/2015  . Pneumococcal Conjugate-13 06/07/2015  . Pneumococcal Polysaccharide-23 04/13/2014  . Td 05/11/2010  . Zoster 12/23/2007    RISK FACTORS  Tobacco History  Smoking Status  . Never Smoker  Smokeless Tobacco  . Never Used     Cardiac risk factors: advanced age (older than 28 for men, 32 for women), dyslipidemia, hypertension, male gender and obesity (BMI >= 30 kg/m2).  Depression Screen  Depression screen [facility] 2/9 06/07/2015  Decreased Interest 0  Down, Depressed, Hopeless 0  PHQ - 2 Score 0  Altered sleeping 0  Tired, decreased energy 0  Change in appetite 0  Feeling bad or failure about yourself  0  Trouble concentrating 0  Moving slowly or fidgety/restless 0  Suicidal thoughts 0  PHQ-9 Score 0     Activities of Daily Living In your present state of health, do you have any difficulty performing the following activities?:  Driving? No Managing money?  No Feeding yourself?  No Getting from bed to chair? No Climbing a flight of stairs? No Preparing food and eating?: No Bathing or showering? No Getting dressed: No Getting to the toilet? No Using the toilet: No Moving around from place to place: No In the past year have you fallen or had a near fall?:No   Home Safety Has smoke detector and wears seat belts. No firearms. No excess sun exposure. Are there smokers in your home (other than you)?  No Do you feel safe at home?  Yes  Hearing Difficulties: No Do you often ask people to speak up or repeat themselves? No Do you experience ringing or noises in your ears? No  Do you have difficulty understanding soft or whispered voices? No    Cognitive Testing  Alert? Yes   Normal Appearance? Yes  Oriented to person? Yes  Place? Yes   Time? Yes  Recall of three objects?  Yes  Can perform simple calculations? Yes  Displays appropriate judgment? Yes  Can read the correct time from a watch face? Yes  Do you feel that you have a problem with memory? No  Do you often misplace items? No   Advanced Directives have been discussed with the patient? Yes   Current Physicians/Providers and Suppliers  1. Terri Piedra, FNP - Internal Medicine 2. Danella Sensing, MD - Dermatology 3. Jenkins Rouge, MD - Cardiology 4. Rexene Edison, NP - Pulmonology 5.  Chesley Mires, MD - Pulmonology  Indicate any recent Medical Services you may have received from other than [facility] providers in the past year (date may be approximate).  All answers were reviewed with the patient and necessary referrals were made:  [name], [place]   07/10/2016    No Known Allergies   Outpatient Medications Prior to Visit  Medication Sig Dispense Refill  . aspirin 81 MG tablet Take 81 mg by mouth daily.      [name] atorvastatin (LIPITOR) 10 MG tablet Take 1 tablet (10 mg total) by mouth daily. 90 tablet 3  . Cholecalciferol (VITAMIN D3) 1000 UNITS CAPS Take 1 capsule by mouth daily.     . metoprolol  succinate (TOPROL-XL) 50 MG 24 hr tablet Take 1 tablet (50 mg total) by mouth daily. Needs office visit for further refills 30 tablet 0  . Multiple Vitamin (MULTIVITAMIN) tablet Take 1 tablet by mouth daily.      . nitroGLYCERIN (NITROSTAT) 0.4 MG SL tablet Place 1 tablet (0.4 mg total) under the tongue every 5 (five) minutes as needed for chest pain. 25 tablet 5  . Omega-3 Fatty Acids (OMEGA 3 PO) Take 1 capsule by mouth daily.      No facility-administered medications prior to visit.      Past Medical History:  Diagnosis Date  . BPH (benign prostatic hypertrophy)   . Cancer ([place])    Basal cell of face, [name]  . Diverticulosis   . Gilbert syndrome   . Hyperglycemia   . Hyperlipidemia    NMR Lipoprofile 2005; LDL 146 (2007/1330), HDL 38, TG 169.  LDL goal = <100; Framingham Study LDL goal =<130  . Hypertension   . OSA on CPAP   . Tubular adenoma of colon 01/19/08     Past Surgical History:  Procedure Laterality Date  . COLONOSCOPY  2014   neg; [facility]  . COLONOSCOPY W/ POLYPECTOMY  2004 & 2009   Tics; [facility]  . KNEE ARTHROSCOPY     [name], left knee  . TONSILLECTOMY    . WISDOM TOOTH EXTRACTION       Family History  Problem Relation Age of Onset  . Diverticulosis Mother   . Stroke Mother     late 54s  . Parkinsonism Mother   . Diabetes Father   . Hypertension Father   . Heart disease Father 78    CABG  . Cancer Brother     lymphoma  . Appendicitis Brother     ruptured  . Asthma Brother     childhood  . Stroke Paternal Grandmother 60  . Heart attack Maternal Grandmother 80    questionable  . Heart attack Maternal Grandfather 57  . Parkinsonism Maternal Grandfather   . Hypertension Sister   . Appendicitis Sister     ruptured  . Colon cancer Neg Hx   . Esophageal cancer Neg Hx   . Rectal cancer Neg Hx   . Stomach cancer Neg Hx      Social History   Social History  . Marital status: Married    Spouse name: N/A  . Number  of children: 2  . Years of education: 37   Occupational History  . Pharmacist, hospital // Retired [employer]  . Smoking status: Never Smoker  . Smokeless tobacco: Never Used  . Alcohol use 0.0 oz/week     Comment: socially, 3-5 beers/week  . Drug use: No  .  Sexual activity: Not on file   Other Topics Concern  . Not on file   Social History Narrative   Fun: Travel, read, movies, concerts    Denies abuse and feels safe at home.      Review of Systems  Constitutional: Denies fever, chills, fatigue, or significant weight gain/loss. HENT: Head: Denies headache or neck pain Ears: Denies changes in hearing, ringing in ears, earache, drainage Nose: Denies discharge, stuffiness, itching, nosebleed, sinus pain Throat: Denies sore throat, hoarseness, dry mouth, sores, thrush Eyes: Denies loss/changes in vision, pain, redness, blurry/double vision, flashing lights Cardiovascular: Denies chest pain/discomfort, tightness, palpitations, shortness of breath with activity, difficulty lying down, swelling, sudden awakening with shortness of breath Respiratory: Denies shortness of breath, cough, sputum production, wheezing Gastrointestinal: Denies dysphasia, heartburn, change in appetite, nausea, change in bowel habits, rectal bleeding, constipation, diarrhea, yellow skin or eyes Genitourinary: Denies frequency, urgency, burning/pain, blood in urine, incontinence, change in urinary strength. Musculoskeletal: Denies muscle/joint pain, stiffness, back pain, redness or swelling of joints, trauma Skin: Denies rashes, lumps, itching, dryness, color changes, or hair/nail changes Neurological: Denies dizziness, fainting, seizures, weakness, numbness, tingling, tremor Psychiatric - Denies nervousness, stress, depression or memory loss Endocrine: Denies heat or cold intolerance, sweating, frequent urination, excessive thirst, changes in appetite Hematologic: Denies  ease of bruising or bleeding    Objective:     BP 128/88 (BP Location: Left Arm, Patient Position: Sitting, Cuff Size: Large)   Pulse (!) 59   Temp 98.1 F (36.7 C) (Oral)   Resp 16   Ht 5\' 9"  (1.753 m)   Wt 266 lb 6.4 oz (120.8 kg)   SpO2 96%   BMI 39.34 kg/m  Nursing note and vital signs reviewed.  Physical Exam  Constitutional: He is oriented to person, place, and time. He appears well-developed and well-nourished.  HENT:  Head: Normocephalic.  Right Ear: Hearing, tympanic membrane, external ear and ear canal normal.  Left Ear: Hearing, tympanic membrane, external ear and ear canal normal.  Nose: Nose normal.  Mouth/Throat: Uvula is midline, oropharynx is clear and moist and mucous membranes are normal.  Eyes: Conjunctivae and EOM are normal. Pupils are equal, round, and reactive to light.  Neck: Neck supple. No JVD present. No tracheal deviation present. No thyromegaly present.  Cardiovascular: Normal rate, regular rhythm, normal heart sounds and intact distal pulses.   Pulmonary/Chest: Effort normal and breath sounds normal.  Abdominal: Soft. Bowel sounds are normal. He exhibits no distension and no mass. There is no tenderness. There is no rebound and no guarding.  Musculoskeletal: Normal range of motion. He exhibits no edema or tenderness.  Lymphadenopathy:    He has no cervical adenopathy.  Neurological: He is alert and oriented to person, place, and time. He has normal reflexes. No cranial nerve deficit. He exhibits normal muscle tone. Coordination normal.  Skin: Skin is warm and dry.  Psychiatric: He has a normal mood and affect. His behavior is normal. Judgment and thought content normal.       Assessment & Plan:   During the course of the visit the patient was educated and counseled about appropriate screening and preventive services including:    Pneumococcal vaccine   Influenza vaccine  Prostate cancer screening  Colorectal cancer screening  Diabetes  screening  Nutrition counseling   Diet review for nutrition referral? Yes ____  Not Indicated _X___   Patient Instructions (the written plan) was given to the patient.  Medicare Attestation I have personally reviewed: The patient's  medical and social history Their use of alcohol, tobacco or illicit drugs Their current medications and supplements The patient's functional ability including ADLs,fall risks, home safety risks, cognitive, and hearing and visual impairment Diet and physical activities Evidence for depression or mood disorders  The patient's weight, height, BMI,  have been recorded in the chart.  I have made referrals, counseling, and provided education to the patient based on review of the above and I have provided the patient with a written personalized care plan for preventive services.     Problem List Items Addressed This Visit      Cardiovascular and Mediastinum   Essential hypertension    Blood pressure well controlled with current medication regimen and below goal 140/90. Denies worst headache of life with no symptoms of end organ damage noted on physical exam. Continue current dosage of metoprolol. Encouraged to monitor blood pressure at home and follow low-sodium diet. Continue to monitor.      Relevant Orders   CBC   Comprehensive metabolic panel     Respiratory   OSA (obstructive sleep apnea)    Sleep apnea appears stable and currently maintained on CPAP and managed by pulmonology. Discussed importance of continued CPAP usage to decreased risk increased risk for cardiovascular disease in the future.        Other   Hyperlipidemia    Most recent lipid profile reviewed and appears well controlled with current medication regimen and no adverse side effects. Obtain lipid profile and continue current dosage of atorvastatin pending lipid profile results.      Relevant Orders   Lipid panel   Hyperglycemia    Previously noted to have hyperglycemia and  prediabetes with most recent A1c of 6.0. Obtain A1c. Continue with lifestyle management pending A1c results.      Relevant Orders   Hemoglobin A1c   Obesity     BMI of 39. Recommend weight loss of 5-10% of current body weight. Recommend increasing physical activity to 30 minutes of moderate level activity daily. Encourage nutritional intake that focuses on nutrient dense foods and is moderate, varied, and balanced and is low in saturated fats and processed/sugary foods. Continue to monitor.        Medicare annual wellness visit, subsequent - Primary    Reviewed and updated patient's medical, surgical, family and social history. Medications and allergies were also reviewed. Basic screenings for depression, activities of daily living, hearing, cognition and safety were performed. Provider list was updated and health plan was provided to the patient.   All immunizations are up-to-date per recommendations. Obtain PSA for prostate cancer screening. Obtain hemoglobin A1c for diabetes screening with most recent history of hyperglycemia. All other screenings are up-to-date per recommendations.  Overall well exam with risk factors for cardiovascular disease including obesity, obstructive sleep apnea, hypertension, and hyperlipidemia. Recommend continued weight loss of 5-10% of current body weight through nutrition and physical activity. Goal is to remain/improve health over the course of 2018. Continue other healthy lifestyle behaviors and choices. Follow-up prevention exam in 1 year. Follow-up office visit for chronic conditions pending blood work.       Other Visit Diagnoses    Special screening, prostate cancer       Relevant Orders   PSA, Medicare       I am having Mr. Carde maintain his aspirin, multivitamin, Omega-3 Fatty Acids (OMEGA 3 PO), Vitamin D3, atorvastatin, nitroGLYCERIN, and metoprolol succinate.   Follow-up: Return if symptoms worsen or fail to improve.  [name],  FNP

## 2016-07-10 NOTE — Patient Instructions (Signed)
Thank you for choosing [facility].  SUMMARY AND INSTRUCTIONS:  Medication:  Your prescription(s) have been submitted to your pharmacy or been printed and provided for you. Please take as directed and contact our office if you believe you are having problem(s) with the medication(s) or have any questions.  Labs:  Please stop by the lab on the lower level of the building for your blood work. Your results will be released to [facility] (or called to you) after review, usually within 72 hours after test completion. If any changes need to be made, you will be notified at that same time.  1.) The lab is open from 7:30am to 5:30 pm Monday-Friday 2.) No appointment is necessary 3.) Fasting (if needed) is 6-8 hours after food and drink; black coffee and water are okay   Follow up:  If your symptoms worsen or fail to improve, please contact our office for further instruction, or in case of emergency go directly to the emergency room at the closest medical facility.    Health Maintenance  Topic Date Due  . TETANUS/TDAP  05/11/2020  . COLONOSCOPY  02/03/2023  . INFLUENZA VACCINE  Addressed  . Hepatitis C Screening  Completed  . PNA vac Low Risk Adult  Completed      Health Maintenance, Male A healthy lifestyle and preventive care is important for your health and wellness. Ask your health care provider about what schedule of regular examinations is right for you. What should I know about weight and diet?  Eat a Healthy Diet  Eat plenty of vegetables, fruits, whole grains, low-fat dairy products, and lean protein.  Do not eat a lot of foods high in solid fats, added sugars, or salt. Maintain a Healthy Weight  Regular exercise can help you achieve or maintain a healthy weight. You should:  Do at least 150 minutes of exercise each week. The exercise should increase your heart rate and make you sweat (moderate-intensity exercise).  Do strength-training exercises at least twice a  week. Watch Your Levels of Cholesterol and Blood Lipids  Have your blood tested for lipids and cholesterol every 5 years starting at 69 years of age. If you are at high risk for heart disease, you should start having your blood tested when you are 68 years old. You may need to have your cholesterol levels checked more often if:  Your lipid or cholesterol levels are high.  You are older than 69 years of age.  You are at high risk for heart disease. What should I know about cancer screening? Many types of cancers can be detected early and may often be prevented. Lung Cancer  You should be screened every year for lung cancer if:  You are a current smoker who has smoked for at least 30 years.  You are a former smoker who has quit within the past 15 years.  Talk to your health care provider about your screening options, when you should start screening, and how often you should be screened. Colorectal Cancer  Routine colorectal cancer screening usually begins at 69 years of age and should be repeated every 5-10 years until you are 69 years old. You may need to be screened more often if early forms of precancerous polyps or small growths are found. Your health care provider may recommend screening at an earlier age if you have risk factors for colon cancer.  Your health care provider may recommend using home test kits to check for hidden blood in the stool.  A small camera at the end of a tube can be used to examine your colon (sigmoidoscopy or colonoscopy). This checks for the earliest forms of colorectal cancer. Prostate and Testicular Cancer  Depending on your age and overall health, your health care provider may do certain tests to screen for prostate and testicular cancer.  Talk to your health care provider about any symptoms or concerns you have about testicular or prostate cancer. Skin Cancer  Check your skin from head to toe regularly.  Tell your health care provider about any  new moles or changes in moles, especially if:  There is a change in a mole's size, shape, or color.  You have a mole that is larger than a pencil eraser.  Always use sunscreen. Apply sunscreen liberally and repeat throughout the day.  Protect yourself by wearing long sleeves, pants, a wide-brimmed hat, and sunglasses when outside. What should I know about heart disease, diabetes, and high blood pressure?  If you are 79-68 years of age, have your blood pressure checked every 3-5 years. If you are 4 years of age or older, have your blood pressure checked every year. You should have your blood pressure measured twice-once when you are at a hospital or clinic, and once when you are not at a hospital or clinic. Record the average of the two measurements. To check your blood pressure when you are not at a hospital or clinic, you can use:  An automated blood pressure machine at a pharmacy.  A home blood pressure monitor.  Talk to your health care provider about your target blood pressure.  If you are between 55-14 years old, ask your health care provider if you should take aspirin to prevent heart disease.  Have regular diabetes screenings by checking your fasting blood sugar level.  If you are at a normal weight and have a low risk for diabetes, have this test once every three years after the age of 23.  If you are overweight and have a high risk for diabetes, consider being tested at a younger age or more often.  A one-time screening for abdominal aortic aneurysm (AAA) by ultrasound is recommended for men aged 56-75 years who are current or former smokers. What should I know about preventing infection? Hepatitis B  If you have a higher risk for hepatitis B, you should be screened for this virus. Talk with your health care provider to find out if you are at risk for hepatitis B infection. Hepatitis C  Blood testing is recommended for:  Everyone born from [birth year] through 1963.  Anyone with  known risk factors for hepatitis C. Sexually Transmitted Diseases (STDs)  You should be screened each year for STDs including gonorrhea and chlamydia if:  You are sexually active and are younger than 69 years of age.  You are older than 69 years of age and your health care provider tells you that you are at risk for this type of infection.  Your sexual activity has changed since you were last screened and you are at an increased risk for chlamydia or gonorrhea. Ask your health care provider if you are at risk.  Talk with your health care provider about whether you are at high risk of being infected with HIV. Your health care provider may recommend a prescription medicine to help prevent HIV infection. What else can I do?  Schedule regular health, dental, and eye exams.  Stay current with your vaccines (immunizations).  Do not use any tobacco products,  such as cigarettes, chewing tobacco, and e-cigarettes. If you need help quitting, ask your health care provider.  Limit alcohol intake to no more than 2 drinks per day. One drink equals 12 ounces of beer, 5 ounces of wine, or 1 ounces of hard liquor.  Do not use street drugs.  Do not share needles.  Ask your health care provider for help if you need support or information about quitting drugs.  Tell your health care provider if you often feel depressed.  Tell your health care provider if you have ever been abused or do not feel safe at home. This information is not intended to replace advice given to you by your health care provider. Make sure you discuss any questions you have with your health care provider. Document Released: 10/20/2007 Document Revised: 12/21/2015 Document Reviewed: 01/25/2015 Elsevier Interactive Patient Education  2017 [employer].

## 2016-07-10 NOTE — Assessment & Plan Note (Addendum)
Reviewed and updated patient's medical, surgical, family and social history. Medications and allergies were also reviewed. Basic screenings for depression, activities of daily living, hearing, cognition and safety were performed. Provider list was updated and health plan was provided to the patient.   All immunizations are up-to-date per recommendations. Obtain PSA for prostate cancer screening. Obtain hemoglobin A1c for diabetes screening with most recent history of hyperglycemia. All other screenings are up-to-date per recommendations.  Overall well exam with risk factors for cardiovascular disease including obesity, obstructive sleep apnea, hypertension, and hyperlipidemia. Recommend continued weight loss of 5-10% of current body weight through nutrition and physical activity. Goal is to remain/improve health over the course of 2018. Continue other healthy lifestyle behaviors and choices. Follow-up prevention exam in 1 year. Follow-up office visit for chronic conditions pending blood work.

## 2016-07-12 ENCOUNTER — Encounter: Payer: Self-pay | Admitting: Family

## 2016-07-12 ENCOUNTER — Other Ambulatory Visit (INDEPENDENT_AMBULATORY_CARE_PROVIDER_SITE_OTHER): Payer: Medicare Other

## 2016-07-12 DIAGNOSIS — E782 Mixed hyperlipidemia: Secondary | ICD-10-CM

## 2016-07-12 DIAGNOSIS — Z125 Encounter for screening for malignant neoplasm of prostate: Secondary | ICD-10-CM

## 2016-07-12 DIAGNOSIS — I10 Essential (primary) hypertension: Secondary | ICD-10-CM | POA: Diagnosis not present

## 2016-07-12 DIAGNOSIS — R739 Hyperglycemia, unspecified: Secondary | ICD-10-CM

## 2016-07-12 LAB — COMPREHENSIVE METABOLIC PANEL
ALBUMIN: 4.1 g/dL (ref 3.5–5.2)
ALT: 26 U/L (ref 0–53)
AST: 17 U/L (ref 0–37)
Alkaline Phosphatase: 34 U/L — ABNORMAL LOW (ref 39–117)
BUN: 19 mg/dL (ref 6–23)
CHLORIDE: 107 meq/L (ref 96–112)
CO2: 27 meq/L (ref 19–32)
CREATININE: 1.02 mg/dL (ref 0.40–1.50)
Calcium: 9.4 mg/dL (ref 8.4–10.5)
GFR: 77.07 mL/min (ref >60.00)
Glucose, Bld: 139 mg/dL — ABNORMAL HIGH (ref 70–99)
Potassium: 4.3 mEq/L (ref 3.5–5.1)
SODIUM: 142 meq/L (ref 135–145)
Total Bilirubin: 0.7 mg/dL (ref 0.2–1.2)
Total Protein: 6.7 g/dL (ref 6.0–8.3)

## 2016-07-12 LAB — LIPID PANEL
Cholesterol: 131 mg/dL (ref 0–200)
HDL: 36.6 mg/dL — ABNORMAL LOW (ref >39.00)
LDL CALC: 61 mg/dL (ref 0–99)
NonHDL: 93.99
TRIGLYCERIDES: 163 mg/dL — AB (ref 0.0–149.0)
Total CHOL/HDL Ratio: 4
VLDL: 32.6 mg/dL (ref 0.0–40.0)

## 2016-07-12 LAB — CBC
HCT: 44.5 % (ref 39.0–52.0)
Hemoglobin: 15.5 g/dL (ref 13.0–17.0)
MCHC: 34.8 g/dL (ref 30.0–36.0)
MCV: 95.3 fl (ref 78.0–100.0)
Platelets: 213 10*3/uL (ref 150.0–400.0)
RBC: 4.67 Mil/uL (ref 4.22–5.81)
RDW: 12.7 % (ref 11.5–15.5)
WBC: 5.6 10*3/uL (ref 4.0–10.5)

## 2016-07-12 LAB — HEMOGLOBIN A1C: Hgb A1c MFr Bld: 6.3 % (ref 4.6–6.5)

## 2016-07-12 LAB — PSA, MEDICARE: PSA: 1.55 ng/mL (ref 0.10–4.00)

## 2016-07-13 ENCOUNTER — Encounter: Payer: Self-pay | Admitting: Family

## 2016-07-25 DIAGNOSIS — L821 Other seborrheic keratosis: Secondary | ICD-10-CM | POA: Diagnosis not present

## 2016-07-25 DIAGNOSIS — L309 Dermatitis, unspecified: Secondary | ICD-10-CM | POA: Diagnosis not present

## 2016-07-25 DIAGNOSIS — D485 Neoplasm of uncertain behavior of skin: Secondary | ICD-10-CM | POA: Diagnosis not present

## 2016-07-25 DIAGNOSIS — D1801 Hemangioma of skin and subcutaneous tissue: Secondary | ICD-10-CM | POA: Diagnosis not present

## 2016-07-25 DIAGNOSIS — D225 Melanocytic nevi of trunk: Secondary | ICD-10-CM | POA: Diagnosis not present

## 2016-07-25 DIAGNOSIS — L820 Inflamed seborrheic keratosis: Secondary | ICD-10-CM | POA: Diagnosis not present

## 2016-07-25 DIAGNOSIS — L438 Other lichen planus: Secondary | ICD-10-CM | POA: Diagnosis not present

## 2016-07-25 DIAGNOSIS — L570 Actinic keratosis: Secondary | ICD-10-CM | POA: Diagnosis not present

## 2016-07-25 DIAGNOSIS — C44311 Basal cell carcinoma of skin of nose: Secondary | ICD-10-CM | POA: Diagnosis not present

## 2016-07-30 ENCOUNTER — Other Ambulatory Visit: Payer: Self-pay | Admitting: Family

## 2016-08-01 ENCOUNTER — Other Ambulatory Visit: Payer: Self-pay

## 2016-08-02 DIAGNOSIS — H2513 Age-related nuclear cataract, bilateral: Secondary | ICD-10-CM | POA: Diagnosis not present

## 2016-08-17 ENCOUNTER — Encounter: Payer: Self-pay | Admitting: Cardiovascular Disease

## 2016-08-27 DIAGNOSIS — C44311 Basal cell carcinoma of skin of nose: Secondary | ICD-10-CM | POA: Diagnosis not present

## 2016-08-27 DIAGNOSIS — Z85828 Personal history of other malignant neoplasm of skin: Secondary | ICD-10-CM | POA: Diagnosis not present

## 2016-08-27 NOTE — Progress Notes (Signed)
Patient ID: Edwin [name], male   DOB: June 03, 1945, 71 y.o.   MRN: [id number]     Cardiology Office Note   Date:  08/30/2016   ID:  Edwin, [name] [birth year], MRN [id number]  PCP:  Edwin Po, FNP  Cardiologist:   Edwin Rouge, MD   Chief Complaint  Patient presents with  . RBBB  . Medication Management      History of Present Illness: Edwin [name] is a 71 y.o. male initially seen for abnormal ECG 09/2014    Had ETT with PA in April Exercised for about 7 mintues Baseline ECG with RBBB with nonspecific ST changes  Should not have had treadmill with baseline abnormal ECG  As these were accentuated with stress No VT. CRF;s HTN on Rx including statin  Sleep apnea recently seen by Dr Halford Chessman  Wears CPAP Having twinges of SSCP lasting minutes  Not always exertional having for months  No associated palpitations syncope or dyspnea  Has had achilles issue and gained some weight Scared to start exercising more until CAD r/o given recent stress test results.    09/11/14 cardiac CT showed very high calcium score and moderate CAD  Reviewed IMPRESSION: 1) Calcium Score 1010 dense in proximal and mid LAD 91st percentile for age and sex matched controls  2) Moderate 50% proximal and mid LAD calcified disease and 50% ostial D3 disease No obstructive soft plaque in LM  Less than 50% calcified proximal and mid RCA disease  3) Given high calcium score consider f/u perfusion study  F/U perfusion study 12/01/14 reviewed:  EF 58%  Normal with no ischemia or infarction   Starting taking statin 3 x / week  Lab Results  Component Value Date   LDLCALC 61 07/12/2016     BP up Discussed exercise and weight loss He is going to [facility] down time  Started taking Toprol in am last visit. Home readings reviewed and bettter   Sees [facility] had basal cell removed from nose Going on [place] cruise to [place] in June   Past Medical History:  Diagnosis Date  . BPH (benign prostatic  hypertrophy)   . Cancer ([place])    Basal cell of face, Dr. Wilhemina [name]  . Diverticulosis   . Gilbert syndrome   . Hyperglycemia   . Hyperlipidemia    NMR Lipoprofile 2005; LDL 146 (2007/1330), HDL 38, TG 169.  LDL goal = <100; Framingham Study LDL goal =<130  . Hypertension   . OSA on CPAP   . Tubular adenoma of colon 01/19/08    Past Surgical History:  Procedure Laterality Date  . COLONOSCOPY  2014   neg; [facility]  . COLONOSCOPY W/ POLYPECTOMY  2004 & 2009   Tics; [facility]  . KNEE ARTHROSCOPY     Dr.Andy [name], left knee  . TONSILLECTOMY    . WISDOM TOOTH EXTRACTION       Current Outpatient Prescriptions  Medication Sig Dispense Refill  . aspirin 81 MG tablet Take 81 mg by mouth daily.      [name] atorvastatin (LIPITOR) 10 MG tablet Take 1 tablet (10 mg total) by mouth daily. 90 tablet 3  . Cholecalciferol (VITAMIN D3) 1000 UNITS CAPS Take 1 capsule by mouth daily.     . metoprolol succinate (TOPROL-XL) 50 MG 24 hr tablet TAKE 1 TABLET ONCE DAILY. TAKE WITH OR IMMEDIATELY FOLLOWING A MEAL. 90 tablet 0  . Multiple Vitamin (MULTIVITAMIN) tablet Take 1 tablet by mouth daily.      [name]  nitroGLYCERIN (NITROSTAT) 0.4 MG SL tablet Place 1 tablet (0.4 mg total) under the tongue every 5 (five) minutes as needed for chest pain. 25 tablet 5  . Omega-3 Fatty Acids (OMEGA 3 [name]) Take 1 capsule by mouth daily.      No current facility-administered medications for this visit.     Allergies:   Patient has no known allergies.    Social History:  The patient  reports that he has never smoked. He has never used smokeless tobacco. He reports that he drinks alcohol. He reports that he does not use drugs.   Family History:  The patient's family history includes Appendicitis in his brother and sister; Asthma in his brother; Cancer in his brother; Diabetes in his father; Diverticulosis in his mother; Heart attack (age of onset: 100) in his maternal grandfather; Heart attack (age of onset: 46) in his  maternal grandmother; Heart disease (age of onset: 55) in his father; Hypertension in his father and sister; Parkinsonism in his maternal grandfather and mother; Stroke in his mother; Stroke (age of onset: 72) in his paternal grandmother.    ROS:  Please see the history of present illness.   Otherwise, review of systems are positive for none.   All other systems are reviewed and negative.      PHYSICAL EXAM: VS:  BP (!) 140/96   Pulse 75   Ht 5\' 9"  (1.753 m)   Wt 265 lb 12.8 oz (120.6 kg)   SpO2 98%   BMI 39.25 kg/m  , BMI Body mass index is 39.25 kg/m. Affect appropriate Healthy:  appears stated age 41: post surgical incision basal cell left nares  Neck supple with no adenopathy JVP normal no bruits no thyromegaly Lungs clear with no wheezing and good diaphragmatic motion Heart:  S1/S2 no murmur, no rub, gallop or click PMI normal Abdomen: benighn, BS positve, no tenderness, no AAA no bruit.  No HSM or HJR Distal pulses intact with no bruits No edema Neuro non-focal Skin warm and dry No muscular weakness    EKG:   06/02/14  SR rate 62 RBBB PAC;s 09/28/15  SR ate 72 PAC RBBB no changes 08/30/16 SR rate 71 RBBB    Recent Labs: 07/12/2016: ALT 26; BUN 19; Creatinine, Ser 1.02; Hemoglobin 15.5; Platelets 213.0; Potassium 4.3; Sodium 142    Lipid Panel    Component Value Date/Time   CHOL 131 07/12/2016 0736   CHOL 202 (H) 06/02/2014 1116   TRIG 163.0 (H) 07/12/2016 0736   TRIG 213 (H) 06/02/2014 1116   TRIG 179 (H) 05/13/2006 1239   HDL 36.60 (L) 07/12/2016 0736   HDL 44 06/02/2014 1116   CHOLHDL 4 07/12/2016 0736   VLDL 32.6 07/12/2016 0736   LDLCALC 61 07/12/2016 0736   LDLCALC 115 (H) 06/02/2014 1116   LDLDIRECT 103.0 06/10/2015 0749      Wt Readings from Last 3 Encounters:  08/30/16 265 lb 12.8 oz (120.6 kg)  07/10/16 266 lb 6.4 oz (120.8 kg)  03/28/16 262 lb 12.8 oz (119.2 kg)      Other studies Reviewed: Additional studies/ records that were reviewed  today include: Epic notes  ETT by DR Linna Darner. myovue and cardiac CT 2016      ASSESSMENT AND PLAN:  1. CAD: Moderate LAD/Circ disease by cardiac CT normal perfusion study  11/30/14 continue risk factor modification and medical Rx will plan stress test July 2018  2. Chol:  Repeat labs pending on higher dose statin  Lab Results  Component Value Date   LDLCALC 61 07/12/2016    3. RBBB:  No change on ECG yearly ECG  4. PAC;s  Benign asymptomatic on beta blocker  5. BP better continue current meds  6. Derm:  Post mow's with basal cell removal left nares healing well     Disposition:   FU with me in a year   [employer]

## 2016-08-30 ENCOUNTER — Ambulatory Visit (INDEPENDENT_AMBULATORY_CARE_PROVIDER_SITE_OTHER): Payer: Medicare Other | Admitting: Cardiovascular Disease

## 2016-08-30 ENCOUNTER — Encounter: Payer: Self-pay | Admitting: Cardiovascular Disease

## 2016-08-30 VITALS — BP 140/96 | HR 75 | Ht 69.0 in | Wt 265.8 lb

## 2016-08-30 DIAGNOSIS — Z79899 Other long term (current) drug therapy: Secondary | ICD-10-CM

## 2016-08-30 DIAGNOSIS — I4510 Unspecified right bundle-branch block: Secondary | ICD-10-CM | POA: Diagnosis not present

## 2016-08-30 DIAGNOSIS — I10 Essential (primary) hypertension: Secondary | ICD-10-CM

## 2016-08-30 NOTE — Patient Instructions (Addendum)
Medication Instructions:  Your physician recommends that you continue on your current medications as directed. Please refer to the Current Medication list given to you today.  Labwork: NONE  Testing/Procedures: NONE  Follow-Up: Your physician wants you to follow-up in: [place] with Dr. [name] Cancel.    If you need a refill on your cardiac medications before your next appointment, please call your pharmacy.

## 2016-09-06 ENCOUNTER — Ambulatory Visit: Payer: Self-pay | Admitting: Cardiovascular Disease

## 2016-09-09 ENCOUNTER — Encounter: Payer: Self-pay | Admitting: Cardiovascular Disease

## 2016-09-24 ENCOUNTER — Encounter: Payer: Self-pay | Admitting: Cardiovascular Disease

## 2016-10-09 ENCOUNTER — Encounter: Payer: Self-pay | Admitting: Family

## 2016-10-09 ENCOUNTER — Ambulatory Visit (INDEPENDENT_AMBULATORY_CARE_PROVIDER_SITE_OTHER): Payer: Self-pay | Admitting: Family

## 2016-10-09 VITALS — BP 154/84 | HR 81 | Temp 98.5°F | Resp 16 | Ht 69.0 in | Wt 268.0 lb

## 2016-10-09 DIAGNOSIS — J40 Bronchitis, not specified as acute or chronic: Secondary | ICD-10-CM

## 2016-10-09 DIAGNOSIS — J209 Acute bronchitis, unspecified: Secondary | ICD-10-CM | POA: Insufficient documentation

## 2016-10-09 MED ORDER — AZITHROMYCIN 250 MG PO TABS: ORAL_TABLET | ORAL | 0 refills | Status: DC

## 2016-10-09 NOTE — Progress Notes (Signed)
Subjective:    Patient ID: Edwin [name], male    DOB: Sep 14, 1945, 71 y.o.   MRN: [id number]  Chief Complaint  Patient presents with  . Cough    has been sick over a week, productive cough, does not feel like it is in sinuses, mainly in chest    HPI:  Edwin [name] is a 71 y.o. male who  has a past medical history of BPH (benign prostatic hypertrophy); Cancer ([place]); Diverticulosis; Edwin [name] syndrome; Hyperglycemia; Hyperlipidemia; Hypertension; OSA on CPAP; and Tubular adenoma of colon (01/19/08). and presents today for an acute office visit.  This is a new problem. Associated symptom of cough and congestion have been going on for about 1 week. Cough is productive. Modifying factors include Robitussn DM which helped him sleep through the night. No fevers. Course of the symptoms is staying about the same since initial onset. He did recently return from a trip to [place]. Wife has similar symptoms.   No Known Allergies    Outpatient Medications Prior to Visit  Medication Sig Dispense Refill  . aspirin 81 MG tablet Take 81 mg by mouth daily.      [name] atorvastatin (LIPITOR) 10 MG tablet Take 1 tablet (10 mg total) by mouth daily. 90 tablet 3  . Cholecalciferol (VITAMIN D3) 1000 UNITS CAPS Take 1 capsule by mouth daily.     . metoprolol succinate (TOPROL-XL) 50 MG 24 hr tablet TAKE 1 TABLET ONCE DAILY. TAKE WITH OR IMMEDIATELY FOLLOWING A MEAL. 90 tablet 0  . Multiple Vitamin (MULTIVITAMIN) tablet Take 1 tablet by mouth daily.      . nitroGLYCERIN (NITROSTAT) 0.4 MG SL tablet Place 1 tablet (0.4 mg total) under the tongue every 5 (five) minutes as needed for chest pain. 25 tablet 5  . Omega-3 Fatty Acids (OMEGA 3 [name]) Take 1 capsule by mouth daily.      No facility-administered medications prior to visit.       Review of Systems  Constitutional: Negative for chills and fever.  HENT: Positive for congestion and sneezing. Negative for ear pain, sinus pain, sinus pressure and sore  throat.   Respiratory: Positive for cough. Negative for chest tightness and shortness of breath.   Neurological: Negative for headaches.      Objective:    BP (!) 154/84 (BP Location: Left Arm, Patient Position: Sitting, Cuff Size: Large)   Pulse 81   Temp 98.5 F (36.9 C) (Oral)   Resp 16   Ht 5\' 9"  (1.753 m)   Wt 268 lb (121.6 kg)   SpO2 95%   BMI 39.58 kg/m  Nursing note and vital signs reviewed.  Physical Exam  Constitutional: He is oriented to person, place, and time. He appears well-developed and well-nourished. No distress.  HENT:  Right Ear: Hearing, tympanic membrane, external ear and ear canal normal.  Left Ear: Hearing, tympanic membrane, external ear and ear canal normal.  Nose: Nose normal.  Mouth/Throat: Uvula is midline, oropharynx is clear and moist and mucous membranes are normal.  Neck: Neck supple.  Cardiovascular: Normal rate, regular rhythm, normal heart sounds and intact distal pulses.  Exam reveals no gallop and no friction rub.   No murmur heard. Pulmonary/Chest: Effort normal. No respiratory distress. He has wheezes. He has no rales. He exhibits no tenderness.  Lymphadenopathy:    He has no cervical adenopathy.  Neurological: He is alert and oriented to person, place, and time.  Skin: Skin is warm and dry.  Psychiatric: He has  a normal mood and affect. His behavior is normal. Judgment and thought content normal.       Assessment & Plan:   Problem List Items Addressed This Visit      Respiratory   Bronchitis - Primary    Symptoms and exam consistent with bronchitis. Start azithromycin. Continue over-the-counter medications as needed for symptom relief and supportive care. Follow-up if symptoms worsen or do not improve.          I am having Mr. Eichel start on azithromycin. I am also having him maintain his aspirin, multivitamin, Omega-3 Fatty Acids (OMEGA 3 [name]), Vitamin D3, atorvastatin, nitroGLYCERIN, and metoprolol succinate.   Meds  ordered this encounter  Medications  . azithromycin (ZITHROMAX) 250 MG tablet    Sig: Take 2 tablets by mouth for 1 day then 1 tablet by mouth daily for 4 days.    Dispense:  6 tablet    Refill:  0    Order Specific Question:   Supervising Provider    Answer:   Pricilla Holm A [[id number]]     Follow-up: Return if symptoms worsen or fail to improve.  Edwin Po, FNP

## 2016-10-09 NOTE — Assessment & Plan Note (Signed)
Symptoms and exam consistent with bronchitis. Start azithromycin. Continue over-the-counter medications as needed for symptom relief and supportive care. Follow-up if symptoms worsen or do not improve.

## 2016-10-09 NOTE — Patient Instructions (Signed)
Thank you for choosing [facility].  SUMMARY AND INSTRUCTIONS:  Please start the azithromycin.   Continue over the counter medications as needed for symptom relief.  Medication:  Your prescription(s) have been submitted to your pharmacy or been printed and provided for you. Please take as directed and contact our office if you believe you are having problem(s) with the medication(s) or have any questions.  Follow up:  If your symptoms worsen or fail to improve, please contact our office for further instruction, or in case of emergency go directly to the emergency room at the closest medical facility.   General Recommendations:    Please drink plenty of fluids.  Get plenty of rest   Sleep in humidified air  Use saline nasal sprays  Netti pot   OTC Medications:  Decongestants - helps relieve congestion   Flonase (generic fluticasone) or Nasacort (generic triamcinolone) - please make sure to use the "cross-over" technique at a 45 degree angle towards the opposite eye as opposed to straight up the nasal passageway.   Sudafed (generic pseudoephedrine - Note this is the one that is available behind the pharmacy counter); Products with phenylephrine (-PE) may also be used but is often not as effective as pseudoephedrine.   If you have HIGH BLOOD PRESSURE - Coricidin HBP; AVOID any product that is -D as this contains pseudoephedrine which may increase your blood pressure.  Afrin (oxymetazoline) every 6-8 hours for up to 3 days.   Allergies - helps relieve runny nose, itchy eyes and sneezing   Claritin (generic loratidine), Allegra (fexofenidine), or Zyrtec (generic cyrterizine) for runny nose. These medications should not cause drowsiness.  Note - Benadryl (generic diphenhydramine) may be used however may cause drowsiness  Cough -   Delsym or Robitussin (generic dextromethorphan)  Expectorants - helps loosen mucus to ease removal   Mucinex (generic guaifenesin)  as directed on the package.  Headaches / General Aches   Tylenol (generic acetaminophen) - DO NOT EXCEED 3 grams (3,000 mg) in a 24 hour time period  Advil/Motrin (generic ibuprofen)   Sore Throat -   Salt water gargle   Chloraseptic (generic benzocaine) spray or lozenges / Sucrets (generic dyclonine)

## 2016-10-31 ENCOUNTER — Other Ambulatory Visit: Payer: Self-pay | Admitting: Family

## 2016-11-14 ENCOUNTER — Ambulatory Visit: Payer: Self-pay | Admitting: Cardiovascular Disease

## 2016-12-04 NOTE — Progress Notes (Signed)
Patient ID: Edwin [name], male   DOB: 09/26/1945, 71 y.o.   MRN: [id number]     Cardiology Office Note   Date:  12/07/2016   ID:  Mardell, Cragg 1945-11-18, MRN [id number]  PCP:  [facility], FNP  Cardiologist:   Jenkins Rouge, MD   No chief complaint on file.     History of Present Illness: Edwin [name] is a 71 y.o. male initially seen for abnormal ECG 09/2014    Had ETT with PA in April Exercised for about 7 mintues Baseline ECG with RBBB with nonspecific ST changes  Should not have had treadmill with baseline abnormal ECG  As these were accentuated with stress No VT. CRF;s HTN on Rx including statin  Sleep apnea   Wears CPAP  .    09/11/14 cardiac CT showed very high calcium score and moderate CAD  Reviewed IMPRESSION: 1) Calcium Score 1010 dense in proximal and mid LAD 91st percentile for age and sex matched controls  2) Moderate 50% proximal and mid LAD calcified disease and 50% ostial D3 disease No obstructive soft plaque in LM  Less than 50% calcified proximal and mid RCA disease  3) Given high calcium score consider f/u perfusion study   F/U perfusion study 12/01/14 reviewed:  EF 58%  Normal with no ischemia or infarction   Starting taking statin 3 x / week  Lab Results  Component Value Date   LDLCALC 61 07/12/2016     BP up Discussed exercise and weight loss He is going to [facility] down time  Started taking Toprol in am last visit. Home readings reviewed and bettter   Sees [facility] had basal cell removed from nose  Past Medical History:  Diagnosis Date  . BPH (benign prostatic hypertrophy)   . Cancer ([place])    Basal cell of face, [name]  . Diverticulosis   . Gilbert syndrome   . Hyperglycemia   . Hyperlipidemia    NMR Lipoprofile 2005; LDL 146 (2007/1330), HDL 38, TG 169.  LDL goal = <100; Framingham Study LDL goal =<130  . Hypertension   . OSA on CPAP   . Tubular adenoma of colon 01/19/08    Past Surgical History:    Procedure Laterality Date  . COLONOSCOPY  2014   neg; [facility]  . COLONOSCOPY W/ POLYPECTOMY  2004 & 2009   Tics; [facility]  . KNEE ARTHROSCOPY     [name], left knee  . TONSILLECTOMY    . WISDOM TOOTH EXTRACTION       Current Outpatient Prescriptions  Medication Sig Dispense Refill  . aspirin 81 MG tablet Take 81 mg by mouth daily.      [name] atorvastatin (LIPITOR) 10 MG tablet Take 1 tablet (10 mg total) by mouth every other day.    . Cholecalciferol (VITAMIN D3) 1000 UNITS CAPS Take 1 capsule by mouth daily.     . Cyanocobalamin (VITAMIN B-12 PO) Take by mouth as directed.    . metoprolol succinate (TOPROL-XL) 50 MG 24 hr tablet TAKE 1 TABLET ONCE DAILY. TAKE WITH OR IMMEDIATELY FOLLOWING A MEAL. 90 tablet 0  . Multiple Vitamin (MULTIVITAMIN) tablet Take 1 tablet by mouth daily.      . nitroGLYCERIN (NITROSTAT) 0.4 MG SL tablet Place 1 tablet (0.4 mg total) under the tongue every 5 (five) minutes as needed for chest pain. 25 tablet 5  . Omega-3 Fatty Acids (OMEGA 3 PO) Take 1 capsule by mouth daily.  No current facility-administered medications for this visit.     Allergies:   Patient has no known allergies.    Social History:  The patient  reports that he has never smoked. He has never used smokeless tobacco. He reports that he drinks alcohol. He reports that he does not use drugs.   Family History:  The patient's family history includes Appendicitis in his brother and sister; Asthma in his brother; Cancer in his brother; Diabetes in his father; Diverticulosis in his mother; Heart attack (age of onset: 61) in his maternal grandfather; Heart attack (age of onset: 68) in his maternal grandmother; Heart disease (age of onset: 37) in his father; Hypertension in his father and sister; Parkinsonism in his maternal grandfather and mother; Stroke in his mother; Stroke (age of onset: 70) in his paternal grandmother.    ROS:  Please see the history of present illness.    Otherwise, review of systems are positive for none.   All other systems are reviewed and negative.      PHYSICAL EXAM: VS:  BP 130/78   Pulse 74   Ht 5\' 9"  (1.753 m)   Wt 264 lb (119.7 kg)   BMI 38.99 kg/m  , BMI Body mass index is 38.99 kg/m. Affect appropriate Healthy:  appears stated age 8: left nares previous surgery skin cancer  Neck supple with no adenopathy JVP normal no bruits no thyromegaly Lungs clear with no wheezing and good diaphragmatic motion Heart:  S1/S2 no murmur, no rub, gallop or click PMI normal Abdomen: benighn, BS positve, no tenderness, no AAA no bruit.  No HSM or HJR Distal pulses intact with no bruits No edema Neuro non-focal Skin warm and dry No muscular weakness     EKG:   06/02/14  SR rate 62 RBBB PAC;s 09/28/15  SR ate 72 PAC RBBB no changes 08/30/16 SR rate 71 RBBB    Recent Labs: 07/12/2016: ALT 26; BUN 19; Creatinine, Ser 1.02; Hemoglobin 15.5; Platelets 213.0; Potassium 4.3; Sodium 142    Lipid Panel    Component Value Date/Time   CHOL 131 07/12/2016 0736   CHOL 202 (H) 06/02/2014 1116   TRIG 163.0 (H) 07/12/2016 0736   TRIG 213 (H) 06/02/2014 1116   TRIG 179 (H) 05/13/2006 1239   HDL 36.60 (L) 07/12/2016 0736   HDL 44 06/02/2014 1116   CHOLHDL 4 07/12/2016 0736   VLDL 32.6 07/12/2016 0736   LDLCALC 61 07/12/2016 0736   LDLCALC 115 (H) 06/02/2014 1116   LDLDIRECT 103.0 06/10/2015 0749      Wt Readings from Last 3 Encounters:  12/07/16 264 lb (119.7 kg)  10/09/16 268 lb (121.6 kg)  08/30/16 265 lb 12.8 oz (120.6 kg)      Other studies Reviewed: Additional studies/ records that were reviewed today include: Epic notes  ETT by DR Linna Darner. myovue and cardiac CT 2016      ASSESSMENT AND PLAN:  1. CAD: Moderate LAD/Circ disease by cardiac CT normal perfusion study  11/30/14 continue risk factor modification and medical Rx will consider f/u CTA/FFR CT in a year  2. Chol:  At goal taking lipitor qod Lab Results    Component Value Date   LDLCALC 61 07/12/2016    3. RBBB:  No change on ECG yearly ECG  4. PAC;s  Benign asymptomatic on beta blocker  5. BP better continue current meds  6. Derm:  Post mow's with basal cell removal left nares no recurrence      Disposition:  FU with me in a year   Jenkins Rouge

## 2016-12-07 ENCOUNTER — Ambulatory Visit (INDEPENDENT_AMBULATORY_CARE_PROVIDER_SITE_OTHER): Payer: Medicare Other | Admitting: Cardiovascular Disease

## 2016-12-07 ENCOUNTER — Encounter: Payer: Self-pay | Admitting: Cardiovascular Disease

## 2016-12-07 DIAGNOSIS — E7849 Other hyperlipidemia: Secondary | ICD-10-CM

## 2016-12-07 DIAGNOSIS — I2510 Atherosclerotic heart disease of native coronary artery without angina pectoris: Secondary | ICD-10-CM

## 2016-12-07 DIAGNOSIS — R931 Abnormal findings on diagnostic imaging of heart and coronary circulation: Secondary | ICD-10-CM | POA: Diagnosis not present

## 2016-12-07 DIAGNOSIS — E784 Other hyperlipidemia: Secondary | ICD-10-CM | POA: Diagnosis not present

## 2016-12-07 MED ORDER — ATORVASTATIN CALCIUM 10 MG PO TABS: 10.0000 mg | ORAL_TABLET | ORAL | Status: DC

## 2016-12-07 NOTE — Patient Instructions (Addendum)

## 2017-01-16 DIAGNOSIS — Z23 Encounter for immunization: Secondary | ICD-10-CM | POA: Diagnosis not present

## 2017-01-22 ENCOUNTER — Telehealth: Payer: Self-pay | Admitting: Internal Medicine

## 2017-01-22 NOTE — Telephone Encounter (Signed)
This patient called stating that he recently received a letter regarding Edwin [name] leaving the practice. He said that he is looking for a new PCP and wanted to know if you would see him as a new patient to establish care. He was a previous patient of Dr Linna Darner and his wife is a current patient of yours. Would you see him? Please advise.

## 2017-01-23 NOTE — Telephone Encounter (Signed)
I can accept him.   

## 2017-01-24 NOTE — Telephone Encounter (Signed)
LM letting pt know °

## 2017-01-29 ENCOUNTER — Other Ambulatory Visit: Payer: Self-pay | Admitting: Family

## 2017-02-14 DIAGNOSIS — R7303 Prediabetes: Secondary | ICD-10-CM | POA: Insufficient documentation

## 2017-02-14 NOTE — Patient Instructions (Addendum)
  Test(s) ordered today. Your results will be released to MyChart (or called to you) after review, usually within 72hours after test completion. If any changes need to be made, you will be notified at that same time.  No immunizations administered today.   Medications reviewed and updated.  No changes recommended at this time.  Please followup in 6 months   

## 2017-02-14 NOTE — Progress Notes (Signed)
Subjective:    Patient ID: Edwin [name], male    DOB: [birth year], 71 y.o.   MRN: [id number]  HPI  He is here to establish with a new pcp.   The patient is here for follow up.  Hyperlipidemia: He is taking his medication daily. He is compliant with a low fat/cholesterol diet. He is exercising irregularly. He denies myalgias.   Hypertension: He is taking his medication daily. He is compliant with a low sodium diet.  He denies chest pain, palpitations, edema, shortness of breath and regular headaches. He is [facility] irregularly - [place] [facility] but goes in spurts.  He does not monitor his blood pressure at home regularly.    Prediabetes:  He is compliant with a low sugar/carbohydrate diet.  He is exercising irregularly.  Right lower back pain/posteiror hip pain; started 6-8 weeks ago.  The pain is in the lower back and lateral hip.  He denies pain, numbness/tingling, weakness in his right leg.  Aleve has helped.   Medications and allergies reviewed with patient and updated if appropriate.  Patient Active Problem List   Diagnosis Date Noted  . Prediabetes 02/14/2017  . Patellofemoral arthritis of right knee 12/30/2014  . Obesity 11/18/2014  . Nonspecific abnormal electrocardiogram (ECG) (EKG) 08/18/2014  . OSA (obstructive sleep apnea) 06/02/2014  . Achilles tendinosis 12/26/2012  . RBBB 05/20/2012  . Hyperlipidemia 03/03/2008  . Essential hypertension 03/03/2008  . History of colonic polyps 03/03/2008  . History of basal cell cancer 05/26/2007  . GILBERT'S SYNDROME 05/26/2007  . DIVERTICULOSIS, COLON 05/26/2007  . BPH (benign prostatic hyperplasia) 05/26/2007    Current Outpatient Prescriptions on File Prior to Visit  Medication Sig Dispense Refill  . aspirin 81 MG tablet Take 81 mg by mouth daily.      [name] atorvastatin (LIPITOR) 10 MG tablet Take 1 tablet (10 mg total) by mouth every other day.    . Cholecalciferol (VITAMIN D3) 1000 UNITS CAPS Take 1 capsule by mouth daily.      . Cyanocobalamin (VITAMIN B-12 PO) Take by mouth as directed.    . metoprolol succinate (TOPROL-XL) 50 MG 24 hr tablet Take 1 tablet (50 mg total) by mouth daily. Annual appt is overdue must see provider for future refills 30 tablet 0  . Multiple Vitamin (MULTIVITAMIN) tablet Take 1 tablet by mouth daily.      . nitroGLYCERIN (NITROSTAT) 0.4 MG SL tablet Place 1 tablet (0.4 mg total) under the tongue every 5 (five) minutes as needed for chest pain. 25 tablet 5  . Omega-3 Fatty Acids (OMEGA 3 PO) Take 1 capsule by mouth daily.      No current facility-administered medications on file prior to visit.     Past Medical History:  Diagnosis Date  . BPH (benign prostatic hypertrophy)   . Cancer ([place])    Basal cell of face, [name]  . Diverticulosis   . Gilbert syndrome   . Hyperglycemia   . Hyperlipidemia    NMR Lipoprofile 2005; LDL 146 (2007/1330), HDL 38, TG 169.  LDL goal = <100; Framingham Study LDL goal =<130  . Hypertension   . OSA on CPAP   . Tubular adenoma of colon 01/19/08    Past Surgical History:  Procedure Laterality Date  . COLONOSCOPY  2014   neg; [facility]  . COLONOSCOPY W/ POLYPECTOMY  2004 & 2009   Tics; [facility]  . KNEE ARTHROSCOPY     [name], left knee  .  TONSILLECTOMY    . WISDOM TOOTH EXTRACTION      Social History   Social History  . Marital status: Married    Spouse name: N/A  . Number of children: 2  . Years of education: 1   Occupational History  . Pharmacist, hospital // Retired [employer]  . Smoking status: Never Smoker  . Smokeless tobacco: Never Used  . Alcohol use 0.0 oz/week     Comment: socially, 3-5 beers/week  . Drug use: No  . Sexual activity: Not Asked   Other Topics Concern  . None   Social History Narrative   Fun: Travel, read, movies, concerts    Denies abuse and feels safe at home.     Family History  Problem Relation Age of Onset  . Diverticulosis Mother    . Stroke Mother        late 10s  . Parkinsonism Mother   . Diabetes Father   . Hypertension Father   . Heart disease Father 70       CABG  . Cancer Brother        lymphoma  . Appendicitis Brother        ruptured  . Asthma Brother        childhood  . Stroke Paternal Grandmother 13  . Heart attack Maternal Grandmother 80       questionable  . Heart attack Maternal Grandfather 57  . Parkinsonism Maternal Grandfather   . Hypertension Sister   . Appendicitis Sister        ruptured  . Colon cancer Neg Hx   . Esophageal cancer Neg Hx   . Rectal cancer Neg Hx   . Stomach cancer Neg Hx     Review of Systems  Constitutional: Negative for chills and fever.  HENT: Positive for congestion and sneezing.   Respiratory: Negative for cough, shortness of breath and wheezing.   Cardiovascular: Negative for chest pain, palpitations and leg swelling.  Neurological: Negative for light-headedness and headaches.       Objective:   Vitals:   02/15/17 1400  BP: (!) 146/82  Pulse: 68  Temp: 98.4 F (36.9 C)  SpO2: 98%   Wt Readings from Last 3 Encounters:  02/15/17 265 lb (120.2 kg)  12/07/16 264 lb (119.7 kg)  10/09/16 268 lb (121.6 kg)   Body mass index is 39.13 kg/m.   Physical Exam    Constitutional: Appears well-developed and well-nourished. No distress.  HENT:  Head: Normocephalic and atraumatic.  Neck: Neck supple. No tracheal deviation present. No thyromegaly present.  No cervical lymphadenopathy Cardiovascular: Normal rate, regular rhythm and normal heart sounds.   No murmur heard. No carotid bruit .  No edema Pulmonary/Chest: Effort normal and breath sounds normal. No respiratory distress. No has no wheezes. No rales.  Msk; no lumbar pain with palpation, pain in right buttock region - non tender Skin: Skin is warm and dry. Not diaphoretic.  Psychiatric: Normal mood and affect. Behavior is normal.      Assessment & Plan:    See Problem List for Assessment and  Plan of chronic medical problems.

## 2017-02-15 ENCOUNTER — Ambulatory Visit (INDEPENDENT_AMBULATORY_CARE_PROVIDER_SITE_OTHER): Payer: Medicare Other | Admitting: Internal Medicine

## 2017-02-15 ENCOUNTER — Other Ambulatory Visit (INDEPENDENT_AMBULATORY_CARE_PROVIDER_SITE_OTHER): Payer: Medicare Other

## 2017-02-15 ENCOUNTER — Encounter: Payer: Self-pay | Admitting: Internal Medicine

## 2017-02-15 VITALS — BP 146/82 | HR 68 | Temp 98.4°F | Ht 69.0 in | Wt 265.0 lb

## 2017-02-15 DIAGNOSIS — R931 Abnormal findings on diagnostic imaging of heart and coronary circulation: Secondary | ICD-10-CM | POA: Diagnosis not present

## 2017-02-15 DIAGNOSIS — R7303 Prediabetes: Secondary | ICD-10-CM

## 2017-02-15 DIAGNOSIS — M545 Low back pain, unspecified: Secondary | ICD-10-CM

## 2017-02-15 DIAGNOSIS — E782 Mixed hyperlipidemia: Secondary | ICD-10-CM | POA: Diagnosis not present

## 2017-02-15 DIAGNOSIS — I10 Essential (primary) hypertension: Secondary | ICD-10-CM | POA: Diagnosis not present

## 2017-02-15 LAB — COMPREHENSIVE METABOLIC PANEL
ALT: 24 U/L (ref 0–53)
AST: 19 U/L (ref 0–37)
Albumin: 4.2 g/dL (ref 3.5–5.2)
Alkaline Phosphatase: 33 U/L — ABNORMAL LOW (ref 39–117)
BUN: 16 mg/dL (ref 6–23)
CALCIUM: 8.7 mg/dL (ref 8.4–10.5)
CHLORIDE: 104 meq/L (ref 96–112)
CO2: 28 meq/L (ref 19–32)
CREATININE: 0.94 mg/dL (ref 0.40–1.50)
GFR: 84.53 mL/min (ref >60.00)
Glucose, Bld: 98 mg/dL (ref 70–99)
Potassium: 4.1 mEq/L (ref 3.5–5.1)
SODIUM: 139 meq/L (ref 135–145)
Total Bilirubin: 0.7 mg/dL (ref 0.2–1.2)
Total Protein: 7.1 g/dL (ref 6.0–8.3)

## 2017-02-15 LAB — HEMOGLOBIN A1C: Hgb A1c MFr Bld: 6.3 % (ref 4.6–6.5)

## 2017-02-15 NOTE — Assessment & Plan Note (Signed)
Cholesterol well controlled Continue statin 

## 2017-02-15 NOTE — Assessment & Plan Note (Signed)
BP elevated here and has been variable Does not monitor at home - variable Increase exercise Work on weight loss Continue current meds cmp today

## 2017-02-15 NOTE — Assessment & Plan Note (Signed)
Right lower back , no radiculopathy Taking aleve Started 4-6 weeks Advised to see Dr Tamala Julian

## 2017-02-15 NOTE — Assessment & Plan Note (Signed)
Check a1c Low sugar / carb diet Stressed regular exercise, weight loss  

## 2017-02-16 ENCOUNTER — Encounter: Payer: Self-pay | Admitting: Internal Medicine

## 2017-02-19 ENCOUNTER — Encounter: Payer: Self-pay | Admitting: Family Medicine

## 2017-02-19 ENCOUNTER — Ambulatory Visit (INDEPENDENT_AMBULATORY_CARE_PROVIDER_SITE_OTHER)
Admission: RE | Admit: 2017-02-19 | Discharge: 2017-02-19 | Disposition: A | Discharge status: Home / Self Care | Payer: Medicare Other | Source: Ambulatory Visit | Attending: Family Medicine | Admitting: Family Medicine

## 2017-02-19 ENCOUNTER — Ambulatory Visit (INDEPENDENT_AMBULATORY_CARE_PROVIDER_SITE_OTHER): Payer: Medicare Other | Admitting: Family Medicine

## 2017-02-19 VITALS — BP 162/90 | HR 63 | Ht 69.0 in | Wt 269.0 lb

## 2017-02-19 DIAGNOSIS — M545 Low back pain: Secondary | ICD-10-CM | POA: Diagnosis not present

## 2017-02-19 DIAGNOSIS — R931 Abnormal findings on diagnostic imaging of heart and coronary circulation: Secondary | ICD-10-CM | POA: Diagnosis not present

## 2017-02-19 DIAGNOSIS — G5701 Lesion of sciatic nerve, right lower limb: Secondary | ICD-10-CM | POA: Diagnosis not present

## 2017-02-19 IMAGING — DX DG LUMBAR SPINE COMPLETE 4+V
5 series · 5 of 5 positions shown · non-contrast
Comparison: None

CLINICAL DATA: Intermittent low back pain for 6 weeks, no known
injury

EXAM:
LUMBAR SPINE - COMPLETE 4+ VIEW

[l-spine ap]
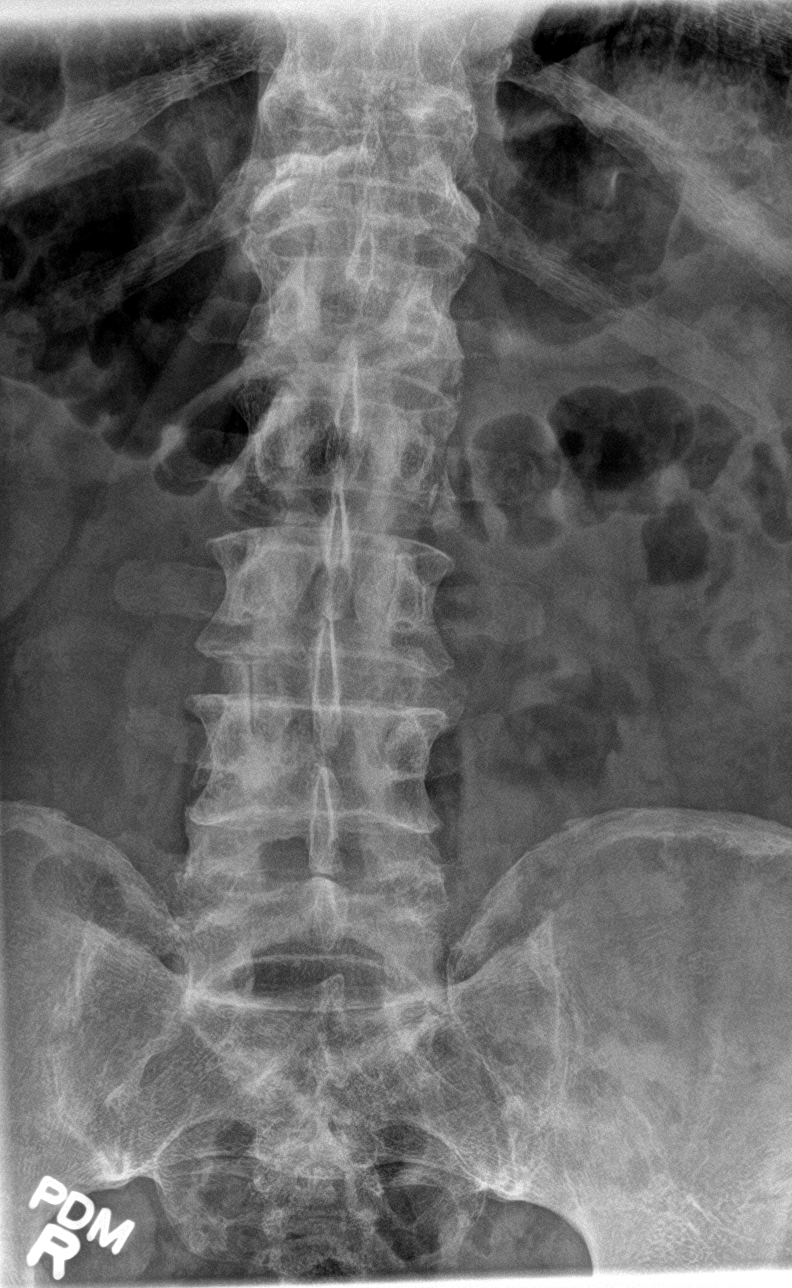

[l-spine obl (1 of 2)]
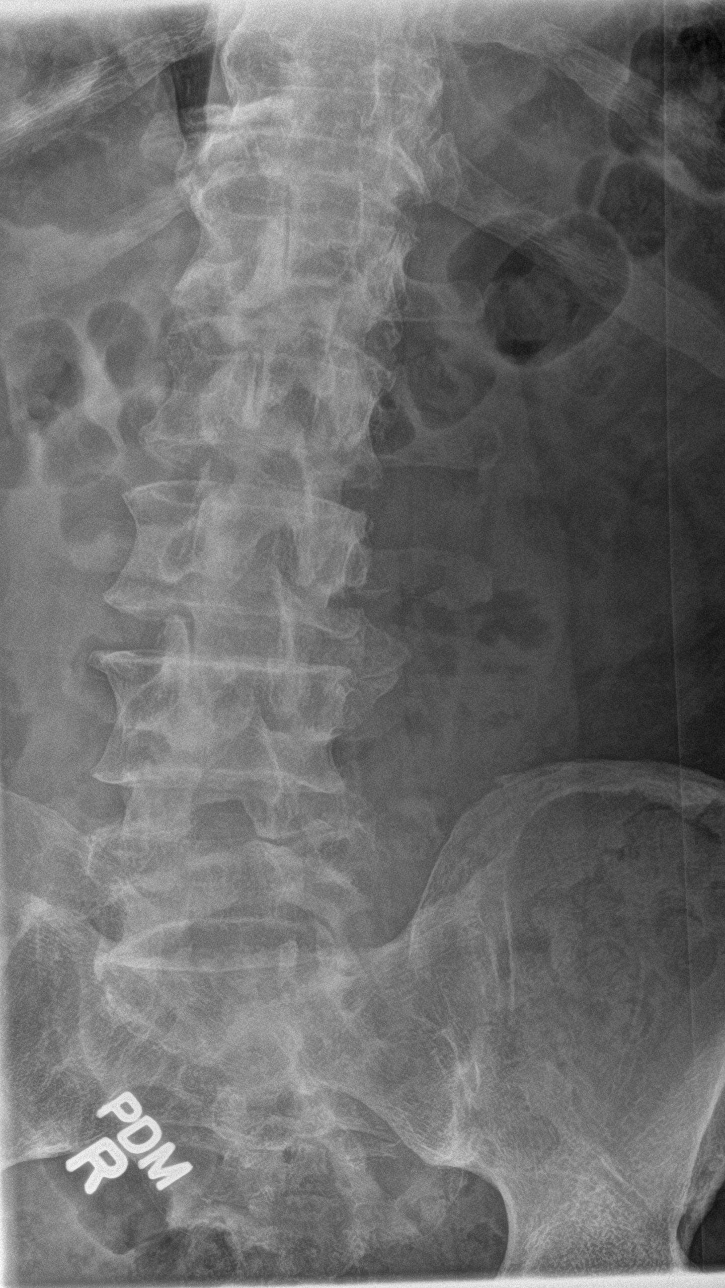

[l-spine obl (2 of 2)]
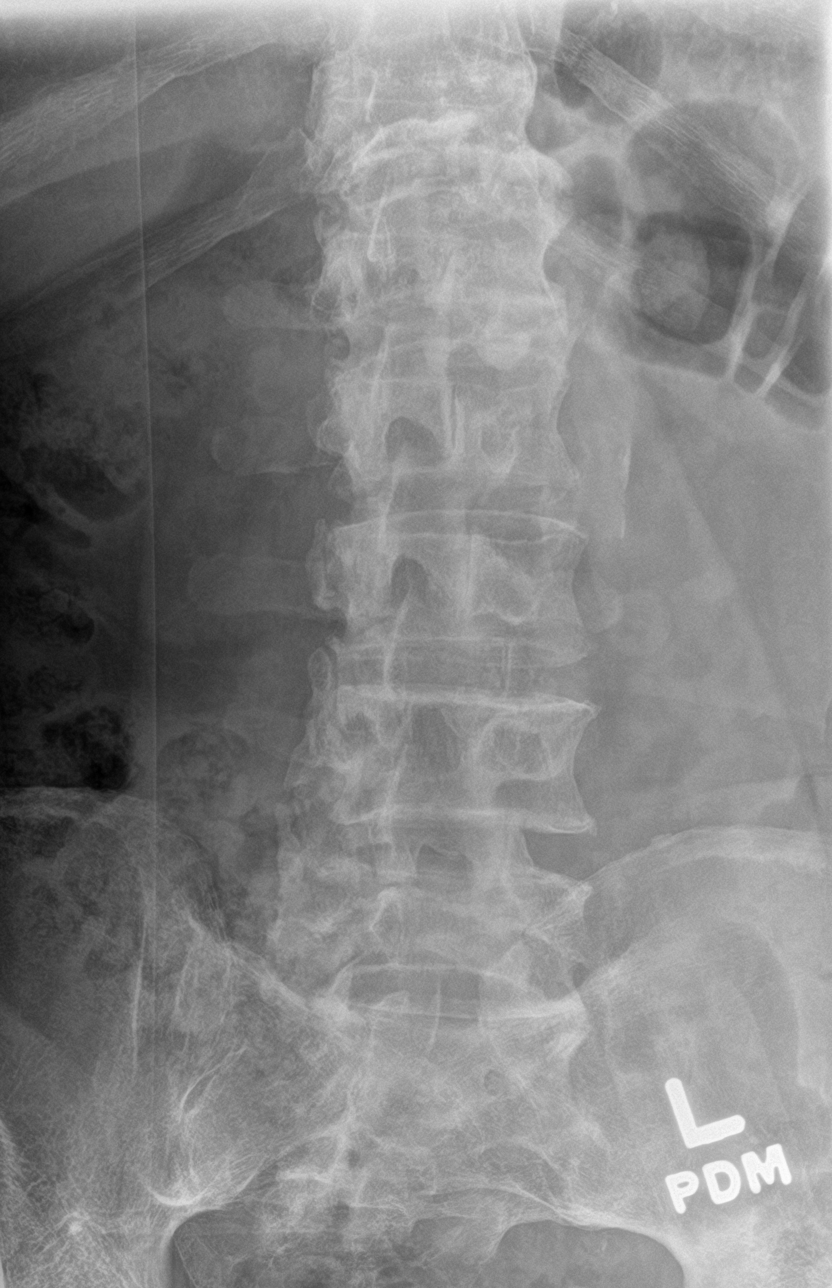

[l-spine lat]
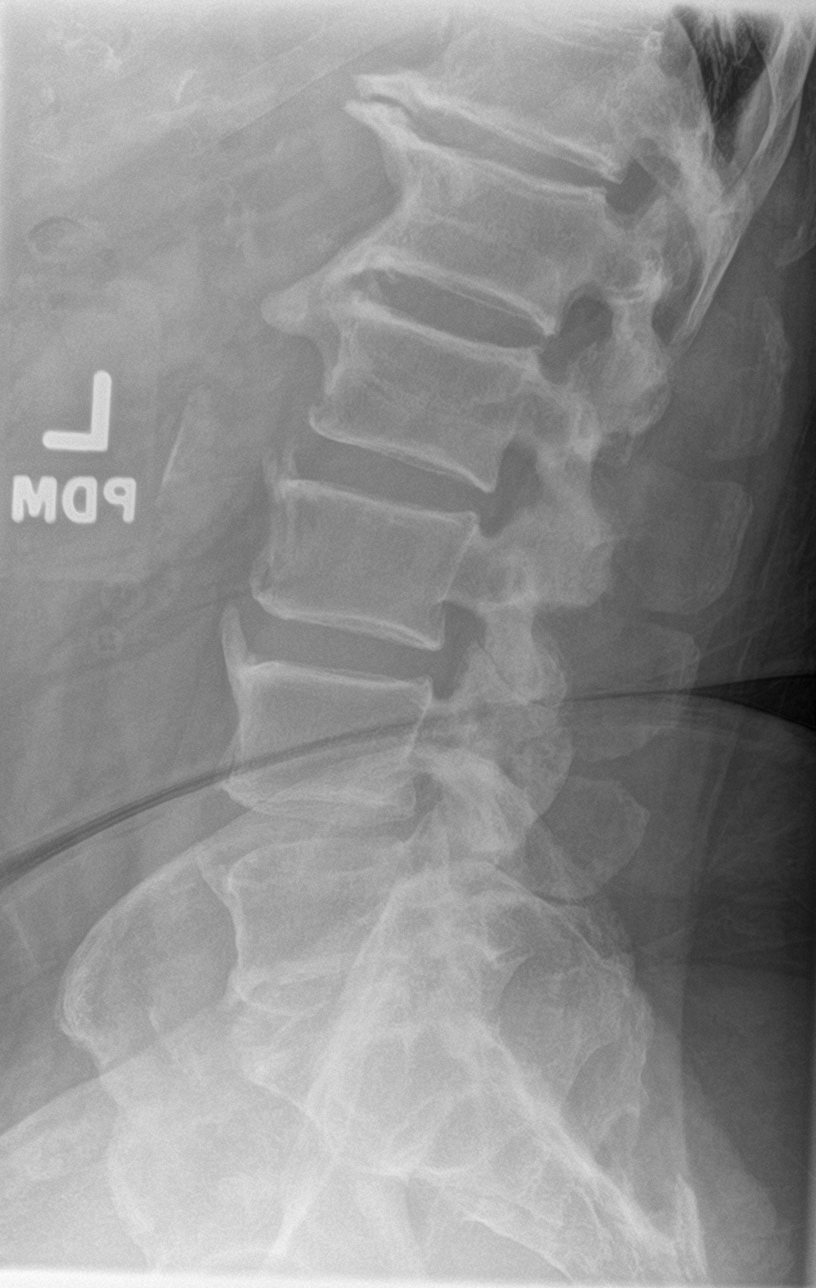

[l-spine spot]
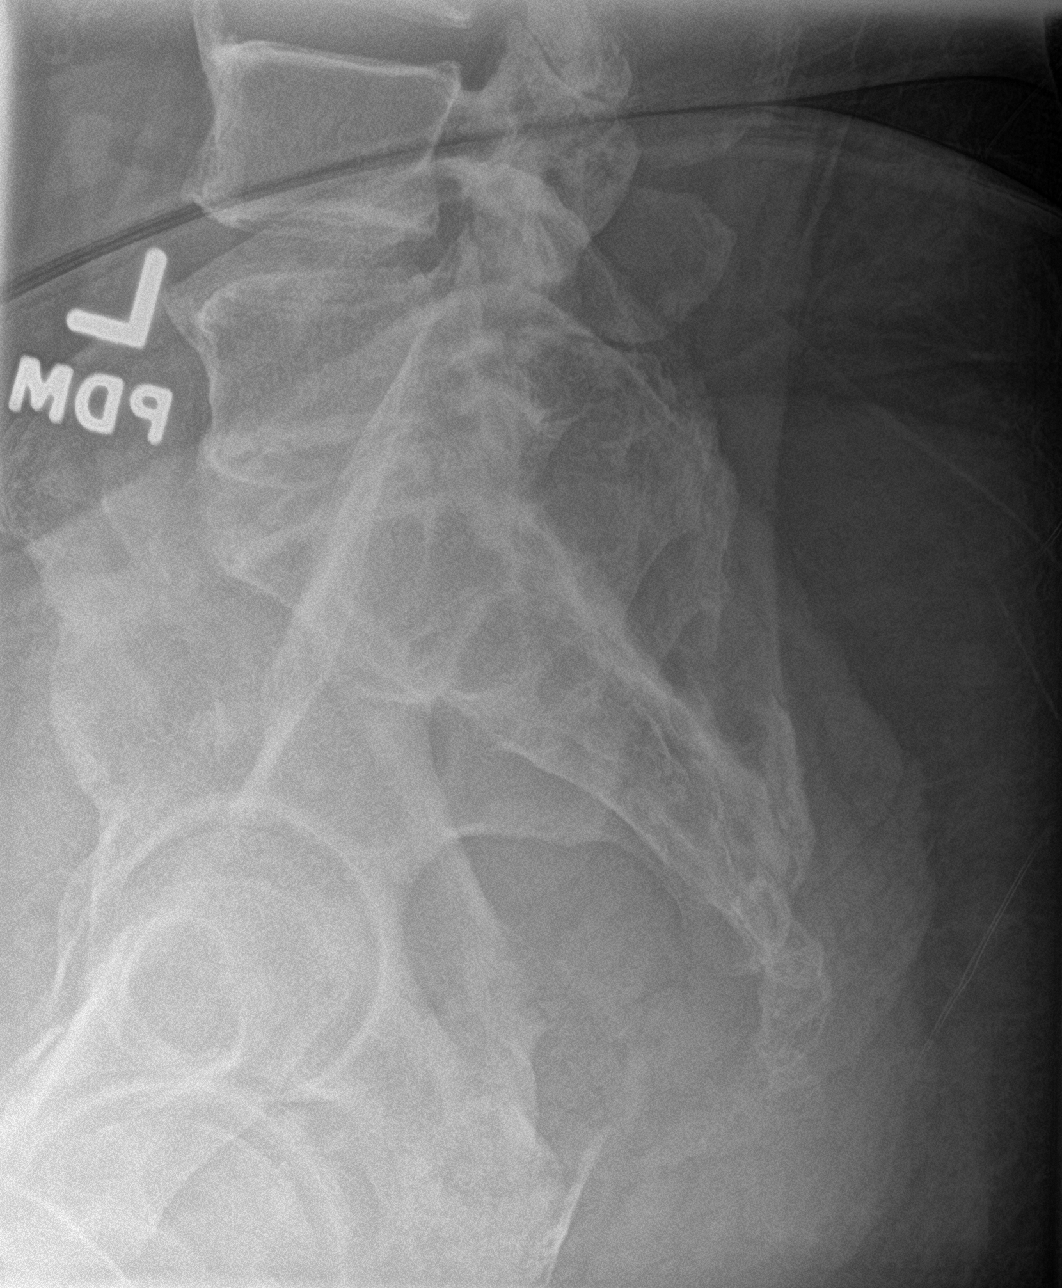

[5 of 5 positions shown; findings below may reference images not displayed]

FINDINGS: Osseous demineralization.

Five non-rib-bearing lumbar vertebra.

Multilevel endplate spur formation.

Vertebral body and disc space heights fairly well maintained.

No acute fracture, subluxation, or bone destruction.

Facet degenerative changes lower lumbar spine.

SI joints suboptimally visualized.

Atherosclerotic calcification aorta.
IMPRESSION: Degenerative disc and facet disease changes lumbar spine.

No acute abnormalities.

## 2017-02-19 MED ORDER — GABAPENTIN 100 MG PO CAPS: 200.0000 mg | ORAL_CAPSULE | Freq: Every day | ORAL | 3 refills | Status: DC

## 2017-02-19 NOTE — Progress Notes (Signed)
[name] [address] [place], [place] [address] Phone: [phone] Subjective:    I'm seeing this patient by the request  of:    CC: Low back pain  GBT:[id number]  CIRILO CANNER is a 71 y.o. male coming in with complaint of low back pain. Does not remember an injury.  Onset- 6-8 weeks ago Location- right side (hip) Character- Achy Aggravating factors- sitting for long periods of time Reliving factors- Aleve, heated seats in car Therapies tried-  Severity-5 out of 10 but does not seem to be improving   patient did have x-rays of her lower back previously. These were done August 2012. Patient is a point did have some mild narrowing at L5-S1.  Past Medical History:  Diagnosis Date  . BPH (benign prostatic hypertrophy)   . Cancer ([place])    Basal cell of face, [name]  . Diverticulosis   . Gilbert syndrome   . Hyperglycemia   . Hyperlipidemia    NMR Lipoprofile 2005; LDL 146 (2007/1330), HDL 38, TG 169.  LDL goal = <100; Framingham Study LDL goal =<130  . Hypertension   . OSA on CPAP   . Tubular adenoma of colon 01/19/08   Past Surgical History:  Procedure Laterality Date  . COLONOSCOPY  2014   neg; [facility]  . COLONOSCOPY W/ POLYPECTOMY  2004 & 2009   Tics; [facility]  . KNEE ARTHROSCOPY     [name], left knee  . TONSILLECTOMY    . WISDOM TOOTH EXTRACTION     Social History   Social History  . Marital status: Married    Spouse name: N/A  . Number of children: 2  . Years of education: 32   Occupational History  . 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 Smoking status: Never Smoker  . Smokeless tobacco: Never Used  . Alcohol use 0.0 oz/week     Comment: socially, 3-5 beers/week  . Drug use: No  . Sexual activity: Not on file   Other Topics Concern  . Not on file   Social History Narrative   Fun: Travel, read, movies, concerts    Denies abuse and feels  safe at home.    No Known Allergies Family History  Problem Relation Age of Onset  . Diverticulosis Mother   . Stroke Mother        late 28s  . Parkinsonism Mother   . Diabetes Father   . Hypertension Father   . Heart disease Father 50       CABG  . Cancer Brother        lymphoma  . Appendicitis Brother        ruptured  . Asthma Brother        childhood  . Stroke Paternal Grandmother 45  . Heart attack Maternal Grandmother 80       questionable  . Heart attack Maternal Grandfather 57  . Parkinsonism Maternal Grandfather   . Hypertension Sister   . Appendicitis Sister        ruptured  . Colon cancer Neg Hx   . Esophageal cancer Neg Hx   . Rectal cancer Neg Hx   . Stomach cancer Neg Hx      Past medical history, social, surgical and family history all reviewed in electronic medical record.  No pertanent information unless stated regarding to the chief complaint.   Review of Systems:Review of systems  updated and as accurate as of 02/19/17  No headache, visual changes, nausea, vomiting, diarrhea, constipation, dizziness, abdominal pain, skin rash, fevers, chills, night sweats, weight loss, swollen lymph nodes, body aches, joint swelling, muscle aches, chest pain, shortness of breath, mood changes.   Objective  There were no vitals taken for this visit. Systems examined below as of 02/19/17   General: No apparent distress alert and oriented x3 mood and affect normal, dressed appropriately.  HEENT: Pupils equal, extraocular movements intact  Respiratory: Patient's speak in full sentences and does not appear short of breath  Cardiovascular: No lower extremity edema, non tender, no erythema  Skin: Warm dry intact with no signs of infection or rash on extremities or on axial skeleton.  Abdomen: Soft nontender  Neuro: Cranial nerves II through XII are intact, neurovascularly intact in all extremities with 2+ DTRs and 2+ pulses.  Lymph: No lymphadenopathy of posterior or  anterior cervical chain or axillae bilaterally.  Gait normal with good balance and coordination.  MSK:  Non tender with full range of motion and good stability and symmetric strength and tone of shoulders, elbows, wrist, hip, knee and ankles bilaterally.  Back Exam:  Inspection: Mild loss of lordosis and poor core strength Motion: Flexion 45 deg, Extension 25 deg, Side Bending to 45 deg bilaterally,  Rotation to 45 deg bilaterally  SLR laying: Negative  XSLR laying: Negative  Palpable tenderness: Tender to palpation more in the gluteal area. FABER: Positive right. Sensory change: Gross sensation intact to all lumbar and sacral dermatomes.  Reflexes: 2+ at both patellar tendons, 2+ at achilles tendons, Babinski's downgoing.  Strength at foot  Plantar-flexion: 5/5 Dorsi-flexion: 5/5 Eversion: 5/5 Inversion: 5/5  Leg strength  Quad: 5/5 Hamstring: 5/5 Hip flexor: 5/5 Hip abductors: 5/5  Gait unremarkable.  Procedure note 93716; 15 additional minutes spent for Therapeutic exercises as stated in above notes.  This included exercises focusing on stretching, strengthening, with significant focus on eccentric aspects.   Long term goals include an improvement in range of motion, strength, endurance as well as avoiding reinjury. Patient's frequency would include in 1-2 times a day, 3-5 times a week for a duration of 6-12 weeks. Piriformis Syndrome  Using an anatomical model, reviewed with the patient the structures involved and how they related to diagnosis. The patient indicated understanding.   The patient was given a handout from Dr. Arne Cleveland book "The Sports Medicine Patient Advisor" describing the anatomy and rehabilitation of the following condition: Piriformis Syndrome  Also given a handout with more extensive Piriformis stretching, hip flexor and abductor strengthening, ham stretching  Rec deep massage, explained self-massage with ball  Proper technique shown and discussed handout in  great detail with ATC.  All questions were discussed and answered.       Impression and Recommendations:     This case required medical decision making of moderate complexity.      Note: This dictation was prepared with Dragon dictation along with smaller phrase technology. Any transcriptional errors that result from this process are unintentional.

## 2017-02-19 NOTE — Patient Instructions (Signed)
Good to see you  I think you have piriformis syndrome.  Heat 10 minutes then ice 10 minutes 2 times a day  Tennis ball in the back right pocket with a lot of sitting can help a lot Gabapentin 200mg  at night Exercises 3 times a week.  Xray downstairs See me again in 3-4 weeks.

## 2017-02-19 NOTE — Assessment & Plan Note (Signed)
Patient likely does have more of a piriformis syndrome. We discussed icing regimen and home exercises. We discussed the importance of hip abductor strengthening. Patient work with Product/process development scientist to learn home exercises in greater detail. We discussed which activities doing which ones to avoid. Patient will slowly increase activity. Follow-up again 4 weeks

## 2017-03-01 ENCOUNTER — Ambulatory Visit (INDEPENDENT_AMBULATORY_CARE_PROVIDER_SITE_OTHER): Payer: Medicare Other | Admitting: Internal Medicine

## 2017-03-01 ENCOUNTER — Encounter: Payer: Self-pay | Admitting: Internal Medicine

## 2017-03-01 VITALS — BP 130/72 | HR 93 | Temp 99.2°F | Resp 16 | Wt 267.0 lb

## 2017-03-01 DIAGNOSIS — J209 Acute bronchitis, unspecified: Secondary | ICD-10-CM | POA: Diagnosis not present

## 2017-03-01 DIAGNOSIS — R931 Abnormal findings on diagnostic imaging of heart and coronary circulation: Secondary | ICD-10-CM | POA: Diagnosis not present

## 2017-03-01 MED ORDER — AZITHROMYCIN 250 MG PO TABS: ORAL_TABLET | ORAL | 0 refills | Status: DC

## 2017-03-01 NOTE — Progress Notes (Signed)
Subjective:    Patient ID: Edwin [name], male    DOB: 07/18/1945, 71 y.o.   MRN: [id number]  HPI He is here for an acute visit for cold symptoms.  His symptoms started  2 days ago  He is experiencing sneezing, watery eyes, deep cough that was productive, sinus pain and frontal headaches.  He denies any fever, but does have a low-grade fever here today.  He denies any shortness of breath or wheezing.  He does have a recent history of bronchitis and he states his symptoms are similar.  He just traveled and saw his grandchildren and is concerned he may have gotten this illness from them.  He has tried taking mucinex dm and cold medication.      Medications and allergies reviewed with patient and updated if appropriate.  Patient Active Problem List   Diagnosis Date Noted  . Piriformis syndrome of right side 02/19/2017  . Lower back pain 02/15/2017  . Prediabetes 02/14/2017  . Patellofemoral arthritis of right knee 12/30/2014  . Obesity 11/18/2014  . Nonspecific abnormal electrocardiogram (ECG) (EKG) 08/18/2014  . OSA (obstructive sleep apnea) 06/02/2014  . Achilles tendinosis 12/26/2012  . RBBB 05/20/2012  . Hyperlipidemia 03/03/2008  . Essential hypertension 03/03/2008  . History of colonic polyps 03/03/2008  . History of basal cell cancer 05/26/2007  . GILBERT'S SYNDROME 05/26/2007  . DIVERTICULOSIS, COLON 05/26/2007  . BPH (benign prostatic hyperplasia) 05/26/2007    Current Outpatient Prescriptions on File Prior to Visit  Medication Sig Dispense Refill  . aspirin 81 MG tablet Take 81 mg by mouth daily.      [name] atorvastatin (LIPITOR) 10 MG tablet Take 1 tablet (10 mg total) by mouth every other day.    . Cholecalciferol (VITAMIN D3) 1000 UNITS CAPS Take 1 capsule by mouth daily.     . Cyanocobalamin (VITAMIN B-12 PO) Take by mouth as directed.    . gabapentin (NEURONTIN) 100 MG capsule Take 2 capsules (200 mg total) by mouth at bedtime. 60 capsule 3  . metoprolol  succinate (TOPROL-XL) 50 MG 24 hr tablet Take 1 tablet (50 mg total) by mouth daily. Annual appt is overdue must see provider for future refills 30 tablet 0  . Multiple Vitamin (MULTIVITAMIN) tablet Take 1 tablet by mouth daily.      . nitroGLYCERIN (NITROSTAT) 0.4 MG SL tablet Place 1 tablet (0.4 mg total) under the tongue every 5 (five) minutes as needed for chest pain. 25 tablet 5  . Omega-3 Fatty Acids (OMEGA 3 PO) Take 1 capsule by mouth daily.      No current facility-administered medications on file prior to visit.     Past Medical History:  Diagnosis Date  . BPH (benign prostatic hypertrophy)   . Cancer ([place])    Basal cell of face, [name]  . Diverticulosis   . Gilbert syndrome   . Hyperglycemia   . Hyperlipidemia    NMR Lipoprofile 2005; LDL 146 (2007/1330), HDL 38, TG 169.  LDL goal = <100; Framingham Study LDL goal =<130  . Hypertension   . OSA on CPAP   . Tubular adenoma of colon 01/19/08    Past Surgical History:  Procedure Laterality Date  . COLONOSCOPY  2014   neg; [facility]  . COLONOSCOPY W/ POLYPECTOMY  2004 & 2009   Tics; [facility]  . KNEE ARTHROSCOPY     [name], left knee  . TONSILLECTOMY    . WISDOM TOOTH EXTRACTION  Social History   Social History  . Marital status: Married    Spouse name: N/A  . Number of children: 2  . Years of education: 47   Occupational History  . Pharmacist, hospital // Retired [employer]  . Smoking status: Never Smoker  . Smokeless tobacco: Never Used  . Alcohol use 0.0 oz/week     Comment: socially, 3-5 beers/week  . Drug use: No  . Sexual activity: Not Asked   Other Topics Concern  . None   Social History Narrative   Fun: Travel, read, movies, concerts    Denies abuse and feels safe at home.     Family History  Problem Relation Age of Onset  . Diverticulosis Mother   . Stroke Mother        late 15s  . Parkinsonism Mother   . Diabetes Father    . Hypertension Father   . Heart disease Father 64       CABG  . Cancer Brother        lymphoma  . Appendicitis Brother        ruptured  . Asthma Brother        childhood  . Stroke Paternal Grandmother 49  . Heart attack Maternal Grandmother 80       questionable  . Heart attack Maternal Grandfather 57  . Parkinsonism Maternal Grandfather   . Hypertension Sister   . Appendicitis Sister        ruptured  . Colon cancer Neg Hx   . Esophageal cancer Neg Hx   . Rectal cancer Neg Hx   . Stomach cancer Neg Hx     Review of Systems  Constitutional: Negative for appetite change, chills and fever.  HENT: Positive for congestion, sinus pain (frontal), sneezing and sore throat (resolved). Negative for ear pain.   Respiratory: Positive for cough (productive). Negative for shortness of breath and wheezing.   Gastrointestinal: Negative for diarrhea and nausea.  Musculoskeletal: Negative for myalgias.  Neurological: Positive for headaches. Negative for light-headedness.       Objective:   Vitals:   03/01/17 1311  BP: 130/72  Pulse: 93  Resp: 16  Temp: 99.2 F (37.3 C)  SpO2: 92%   Filed Weights   03/01/17 1311  Weight: 267 lb (121.1 kg)   Body mass index is 39.43 kg/m.  Wt Readings from Last 3 Encounters:  03/01/17 267 lb (121.1 kg)  02/19/17 269 lb (122 kg)  02/15/17 265 lb (120.2 kg)     Physical Exam GENERAL APPEARANCE: Appears stated age, well appearing, NAD EYES: conjunctiva clear, no icterus HEENT: bilateral tympanic membranes and ear canals normal, oropharynx with mild erythema, no thyromegaly, trachea midline, no cervical or supraclavicular lymphadenopathy LUNGS: Clear to auscultation without wheeze or crackles, unlabored breathing, good air entry bilaterally.  Bronchial cough HEART: Normal S1,S2 without murmurs EXTREMITIES: Without clubbing, cyanosis, or edema        Assessment & Plan:   See Problem List for Assessment and Plan of chronic medical  problems.

## 2017-03-01 NOTE — Assessment & Plan Note (Signed)
Possibly bacterial-it is difficult to say, given his history we will go ahead and start an antibiotic Z-Pak Continue Mucinex DM and try Delsym cough syrup Rest, fluids Call if no improvement

## 2017-03-01 NOTE — Patient Instructions (Signed)
Take the antibiotic as prescribed.  Your prescription(s) have been submitted to your pharmacy or been printed and provided for you. Please take as directed and contact our office if you believe you are having problem(s) with the medication(s) or have any questions.  If your symptoms worsen or fail to improve, please contact our office for further instruction, or in case of emergency go directly to the emergency room at the closest medical facility.   General Recommendations:    Please drink plenty of fluids.  Get plenty of rest   Sleep in humidified air  Use saline nasal sprays  Netti pot  OTC Medications:  Decongestants - helps relieve congestion   Flonase (generic fluticasone) or Nasacort (generic triamcinolone) - please make sure to use the "cross-over" technique at a 45 degree angle towards the opposite eye as opposed to straight up the nasal passageway.   Sudafed (generic pseudoephedrine - Note this is the one that is available behind the pharmacy counter); Products with phenylephrine (-PE) may also be used but is often not as effective as pseudoephedrine.   If you have HIGH BLOOD PRESSURE - Coricidin HBP; AVOID any product that is -D as this contains pseudoephedrine which may increase your blood pressure.  Afrin (oxymetazoline) every 6-8 hours for up to 3 days.  Allergies - helps relieve runny nose, itchy eyes and sneezing   Claritin (generic loratidine), Allegra (fexofenidine), or Zyrtec (generic cyrterizine) for runny nose. These medications should not cause drowsiness.  Note - Benadryl (generic diphenhydramine) may be used however may cause drowsiness  Cough -   Delsym or Robitussin (generic dextromethorphan)  Expectorants - helps loosen mucus to ease removal   Mucinex (generic guaifenesin) as directed on the package.  Headaches / General Aches   Tylenol (generic acetaminophen) - DO NOT EXCEED 3 grams (3,000 mg) in a 24 hour time  period  Advil/Motrin (generic ibuprofen)  Sore Throat -   Salt water gargle   Chloraseptic (generic benzocaine) spray or lozenges / Sucrets (generic dyclonine)

## 2017-03-05 DIAGNOSIS — L821 Other seborrheic keratosis: Secondary | ICD-10-CM | POA: Diagnosis not present

## 2017-03-05 DIAGNOSIS — D485 Neoplasm of uncertain behavior of skin: Secondary | ICD-10-CM | POA: Diagnosis not present

## 2017-03-05 DIAGNOSIS — D225 Melanocytic nevi of trunk: Secondary | ICD-10-CM | POA: Diagnosis not present

## 2017-03-05 DIAGNOSIS — Z85828 Personal history of other malignant neoplasm of skin: Secondary | ICD-10-CM | POA: Diagnosis not present

## 2017-03-05 DIAGNOSIS — D1801 Hemangioma of skin and subcutaneous tissue: Secondary | ICD-10-CM | POA: Diagnosis not present

## 2017-03-08 ENCOUNTER — Other Ambulatory Visit: Payer: Self-pay | Admitting: Internal Medicine

## 2017-03-19 ENCOUNTER — Encounter: Payer: Self-pay | Admitting: Family Medicine

## 2017-03-19 ENCOUNTER — Ambulatory Visit (INDEPENDENT_AMBULATORY_CARE_PROVIDER_SITE_OTHER): Payer: Medicare Other | Admitting: Family Medicine

## 2017-03-19 DIAGNOSIS — G5701 Lesion of sciatic nerve, right lower limb: Secondary | ICD-10-CM | POA: Diagnosis not present

## 2017-03-19 DIAGNOSIS — R931 Abnormal findings on diagnostic imaging of heart and coronary circulation: Secondary | ICD-10-CM | POA: Diagnosis not present

## 2017-03-19 NOTE — Progress Notes (Signed)
[name] [address], [place] [address] Phone: [phone] Subjective:    I'm seeing this patient by the request  of:    CC: Right hip pain follow-up  [id number]  Edwin [name] is a 71 y.o. male coming in for follow up for piriformis syndrome. He said that his pain is pretty much gone but he does feel some pain at random times.  The patient was found to have piriformis.  Responded very well to the gabapentin as well as the home exercises.  Patient states 9200% better at this time.  Patient states that he has noticed he has been sleeping more comfortably.  Denies any new symptoms.      Past Medical History:  Diagnosis Date  . BPH (benign prostatic hypertrophy)   . Cancer ([place])    Basal cell of face, [name]  . Diverticulosis   . Gilbert syndrome   . Hyperglycemia   . Hyperlipidemia    NMR Lipoprofile 2005; LDL 146 (2007/1330), HDL 38, TG 169.  LDL goal = <100; Framingham Study LDL goal =<130  . Hypertension   . OSA on CPAP   . Tubular adenoma of colon 01/19/08   Past Surgical History:  Procedure Laterality Date  . COLONOSCOPY  2014   neg; [facility]  . COLONOSCOPY W/ POLYPECTOMY  2004 & 2009   Tics; [facility]  . KNEE ARTHROSCOPY     [name], left knee  . TONSILLECTOMY    . WISDOM TOOTH EXTRACTION     Social History   Socioeconomic History  . Marital status: Married    Spouse name: Not on file  . Number of children: 2  . Years of education: 67  . Highest education level: Not on file  Social Needs  . Financial resource strain: Not on file  . Food insecurity - worry: Not on file  . Food insecurity - inability: Not on file  . Transportation needs - medical: Not on file  . Transportation needs - non-medical: Not on file  Occupational History  . Occupation: Pharmacist, hospital // Retired    Fish farm manager: [place]  Tobacco Use  . Smoking status: Never Smoker  . Smokeless tobacco: Never Used    Substance and Sexual Activity  . Alcohol use: Yes    Alcohol/week: 0.0 oz    Comment: socially, 3-5 beers/week  . Drug use: No  . Sexual activity: Not on file  Other Topics Concern  . Not on file  Social History Narrative   Fun: Travel, read, movies, concerts    Denies abuse and feels safe at home.    No Known Allergies Family History  Problem Relation Age of Onset  . Diverticulosis Mother   . Stroke Mother        late 64s  . Parkinsonism Mother   . Diabetes Father   . Hypertension Father   . Heart disease Father 15       CABG  . Cancer Brother        lymphoma  . Appendicitis Brother        ruptured  . Asthma Brother        childhood  . Stroke Paternal Grandmother 49  . Heart attack Maternal Grandmother 80       questionable  . Heart attack Maternal Grandfather 57  . Parkinsonism Maternal Grandfather   . Hypertension Sister   . Appendicitis Sister        ruptured  .  Colon cancer Neg Hx   . Esophageal cancer Neg Hx   . Rectal cancer Neg Hx   . Stomach cancer Neg Hx      Past medical history, social, surgical and family history all reviewed in electronic medical record.  No pertanent information unless stated regarding to the chief complaint.   Review of Systems:Review of systems updated and as accurate as of 03/19/17  No headache, visual changes, nausea, vomiting, diarrhea, constipation, dizziness, abdominal pain, skin rash, fevers, chills, night sweats, weight loss, swollen lymph nodes, body aches, joint swelling, muscle aches, chest pain, shortness of breath, mood changes.   Objective  There were no vitals taken for this visit. Systems examined below as of 03/19/17   General: No apparent distress alert and oriented x3 mood and affect normal, dressed appropriately.  HEENT: Pupils equal, extraocular movements intact  Respiratory: Patient's speak in full sentences and does not appear short of breath  Cardiovascular: No lower extremity edema, non tender, no  erythema  Skin: Warm dry intact with no signs of infection or rash on extremities or on axial skeleton.  Abdomen: Soft nontender  Neuro: Cranial nerves II through XII are intact, neurovascularly intact in all extremities with 2+ DTRs and 2+ pulses.  Lymph: No lymphadenopathy of posterior or anterior cervical chain or axillae bilaterally.  Gait normal with good balance and coordination.  MSK:  Non tender with full range of motion and good stability and symmetric strength and tone of shoulders, elbows, wrist, hip, knee and ankles bilaterally.  Back Exam:  Inspection: Mild loss of lordosis Motion: Flexion 45 deg, Extension 25 deg, Side Bending to 45 deg bilaterally,  Rotation to 45 deg bilaterally  SLR laying: Negative  XSLR laying: Negative  Palpable tenderness: Very mild pain over the right piriformis. FABER: Very mild positive right. Sensory change: Gross sensation intact to all lumbar and sacral dermatomes.  Reflexes: 2+ at both patellar tendons, 2+ at achilles tendons, Babinski's downgoing.  Strength at foot  Plantar-flexion: 5/5 Dorsi-flexion: 5/5 Eversion: 5/5 Inversion: 5/5  Leg strength  Quad: 5/5 Hamstring: 5/5 Hip flexor: 5/5 Hip abductors: 5/5  Gait unremarkable.   Impression and Recommendations:     This case required medical decision making of moderate complexity.      Note: This dictation was prepared with Dragon dictation along with smaller phrase technology. Any transcriptional errors that result from this process are unintentional.

## 2017-03-19 NOTE — Patient Instructions (Signed)
Happy holidays! You are doing great  Keep it up  Gabapentin 100-300mg  at night when needed up to you if you want to continue it.  Ice is still your friend [phone] See me when you need me

## 2017-03-19 NOTE — Assessment & Plan Note (Signed)
Significant improvement.  We discussed icing regimen and home exercises.  Patient will continue this and use gabapentin as needed.  Follow-up with me as needed

## 2017-04-11 ENCOUNTER — Other Ambulatory Visit: Payer: Self-pay | Admitting: Internal Medicine

## 2017-04-11 ENCOUNTER — Other Ambulatory Visit: Payer: Self-pay | Admitting: Cardiovascular Disease

## 2017-04-11 DIAGNOSIS — L988 Other specified disorders of the skin and subcutaneous tissue: Secondary | ICD-10-CM | POA: Diagnosis not present

## 2017-04-11 DIAGNOSIS — R931 Abnormal findings on diagnostic imaging of heart and coronary circulation: Secondary | ICD-10-CM

## 2017-04-11 DIAGNOSIS — E7849 Other hyperlipidemia: Secondary | ICD-10-CM

## 2017-04-11 DIAGNOSIS — D485 Neoplasm of uncertain behavior of skin: Secondary | ICD-10-CM | POA: Diagnosis not present

## 2017-04-11 DIAGNOSIS — Z85828 Personal history of other malignant neoplasm of skin: Secondary | ICD-10-CM | POA: Diagnosis not present

## 2017-04-11 MED ORDER — ATORVASTATIN CALCIUM 10 MG PO TABS: 10.0000 mg | ORAL_TABLET | ORAL | 2 refills | Status: DC

## 2017-06-04 NOTE — Progress Notes (Signed)
[name] [address], [place] [address] Phone: [phone] Subjective:     CC: Right knee pain  [name]  Edwin [name] is a 72 y.o. male coming in with complaint of right knee pain. He tripped coming up steps. Extension is painful.   Onset- Jan. 15th Location- posterior Duration-patient states it is only with certain movements Character- Tender, sharp  Aggravating factors- extension Reliving factors-seems to do fine with walking straight. Therapies tried-nothing Severity-4 out of 10     Past Medical History:  Diagnosis Date  . BPH (benign prostatic hypertrophy)   . Cancer ([place])    Basal cell of face, [name]  . Diverticulosis   . Gilbert syndrome   . Hyperglycemia   . Hyperlipidemia    NMR Lipoprofile 2005; LDL 146 (2007/1330), HDL 38, TG 169.  LDL goal = <100; Framingham Study LDL goal =<130  . Hypertension   . OSA on CPAP   . Tubular adenoma of colon 01/19/08   Past Surgical History:  Procedure Laterality Date  . COLONOSCOPY  2014   neg; [facility]  . COLONOSCOPY W/ POLYPECTOMY  2004 & 2009   Tics; [facility]  . KNEE ARTHROSCOPY     [name], left knee  . TONSILLECTOMY    . WISDOM TOOTH EXTRACTION     Social History   Socioeconomic History  . Marital status: Married    Spouse name: None  . Number of children: 2  . Years of education: 44  . Highest education level: None  Social Needs  . Financial resource strain: None  . Food insecurity - worry: None  . Food insecurity - inability: None  . Transportation needs - medical: None  . Transportation needs - non-medical: None  Occupational History  . Occupation: Pharmacist, hospital // Retired    Fish farm manager: [employer]  Tobacco Use  . Smoking status: Never Smoker  . Smokeless tobacco: Never Used  Substance and Sexual Activity  . Alcohol use: Yes    Alcohol/week: 0.0 oz    Comment: socially, 3-5 beers/week  . Drug use: No  .  Sexual activity: None  Other Topics Concern  . None  Social History Narrative   Fun: Travel, read, movies, concerts    Denies abuse and feels safe at home.    No Known Allergies Family History  Problem Relation Age of Onset  . Diverticulosis Mother   . Stroke Mother        late 45s  . Parkinsonism Mother   . Diabetes Father   . Hypertension Father   . Heart disease Father 73       CABG  . Cancer Brother        lymphoma  . Appendicitis Brother        ruptured  . Asthma Brother        childhood  . Stroke Paternal Grandmother 47  . Heart attack Maternal Grandmother 80       questionable  . Heart attack Maternal Grandfather 57  . Parkinsonism Maternal Grandfather   . Hypertension Sister   . Appendicitis Sister        ruptured  . Colon cancer Neg Hx   . Esophageal cancer Neg Hx   . Rectal cancer Neg Hx   . Stomach cancer Neg Hx      Past medical history, social, surgical and family history all reviewed in electronic medical record.  No pertanent information unless stated regarding  to the chief complaint.   Review of Systems:Review of systems updated and as accurate as of 06/05/17  No headache, visual changes, nausea, vomiting, diarrhea, constipation, dizziness, abdominal pain, skin rash, fevers, chills, night sweats, weight loss, swollen lymph nodes,chest pain, shortness of breath, mood changes.  Joint swelling and muscle aches  Objective  Blood pressure (!) 160/90, pulse 65, height 5\' 9"  (1.753 m), weight 265 lb (120.2 kg), SpO2 98 %. Systems examined below as of 06/05/17   General: No apparent distress alert and oriented x3 mood and affect normal, dressed appropriately.  HEENT: Pupils equal, extraocular movements intact  Respiratory: Patient's speak in full sentences and does not appear short of breath  Cardiovascular: No lower extremity edema, non tender, no erythema  Skin: Warm dry intact with no signs of infection or rash on extremities or on axial skeleton.    Abdomen: Soft nontender  Neuro: Cranial nerves II through XII are intact, neurovascularly intact in all extremities with 2+ DTRs and 2+ pulses.  Lymph: No lymphadenopathy of posterior or anterior cervical chain or axillae bilaterally.  Gait normal with good balance and coordination.  MSK:  Non tender with full range of motion and good stability and symmetric strength and tone of shoulders, elbows, wrist, hip, knee and ankles bilaterally.  Right knee exam shows the patient does have some osteoarthritic changes with mild valgus deformity.  Some instability with valgus force on the MCL.  Patient does have some crepitus of the patellofemoral joint.  Neurovascularly intact distally.  Full range of motion. Contralateral knee unremarkable  MSK US performed of: Right knee This study was ordered, performed, and interpreted by Charlann Boxer D.O.  Knee: All structures visualized. Post surgical changes of the medial meniscus noted.  Partial tear noted on the proximal aspect of the MCL of approximately 25%.  No gapping noted with dynamic testing. Patellar Tendon unremarkable on long and transverse views without effusion. Moderate also arthritic changes with narrowing of the medial, lateral and patellofemoral joint  IMPRESSION: Knee arthritis with MCL partial tear    Impression and Recommendations:     This case required medical decision making of moderate complexity.      Note: This dictation was prepared with Dragon dictation along with smaller phrase technology. Any transcriptional errors that result from this process are unintentional.

## 2017-06-05 ENCOUNTER — Encounter: Payer: Self-pay | Admitting: Family Medicine

## 2017-06-05 ENCOUNTER — Ambulatory Visit (INDEPENDENT_AMBULATORY_CARE_PROVIDER_SITE_OTHER)
Admission: RE | Admit: 2017-06-05 | Discharge: 2017-06-05 | Disposition: A | Discharge status: Home / Self Care | Payer: Medicare Other | Source: Ambulatory Visit | Attending: Family Medicine | Admitting: Family Medicine

## 2017-06-05 ENCOUNTER — Ambulatory Visit: Payer: Self-pay

## 2017-06-05 ENCOUNTER — Ambulatory Visit (INDEPENDENT_AMBULATORY_CARE_PROVIDER_SITE_OTHER): Payer: Medicare Other | Admitting: Family Medicine

## 2017-06-05 VITALS — BP 160/90 | HR 65 | Ht 69.0 in | Wt 265.0 lb

## 2017-06-05 DIAGNOSIS — G8929 Other chronic pain: Secondary | ICD-10-CM

## 2017-06-05 DIAGNOSIS — M25561 Pain in right knee: Secondary | ICD-10-CM

## 2017-06-05 DIAGNOSIS — S83411A Sprain of medial collateral ligament of right knee, initial encounter: Secondary | ICD-10-CM

## 2017-06-05 DIAGNOSIS — M1711 Unilateral primary osteoarthritis, right knee: Secondary | ICD-10-CM

## 2017-06-05 IMAGING — DX DG KNEE COMPLETE 4+V*R*
4 series · 4 of 4 positions shown · non-contrast
Comparison: None.

CLINICAL DATA: Chronic right knee pain. Reported fall 2 weeks
prior.

EXAM:
RIGHT KNEE - COMPLETE 4+ VIEW

[knee ap]
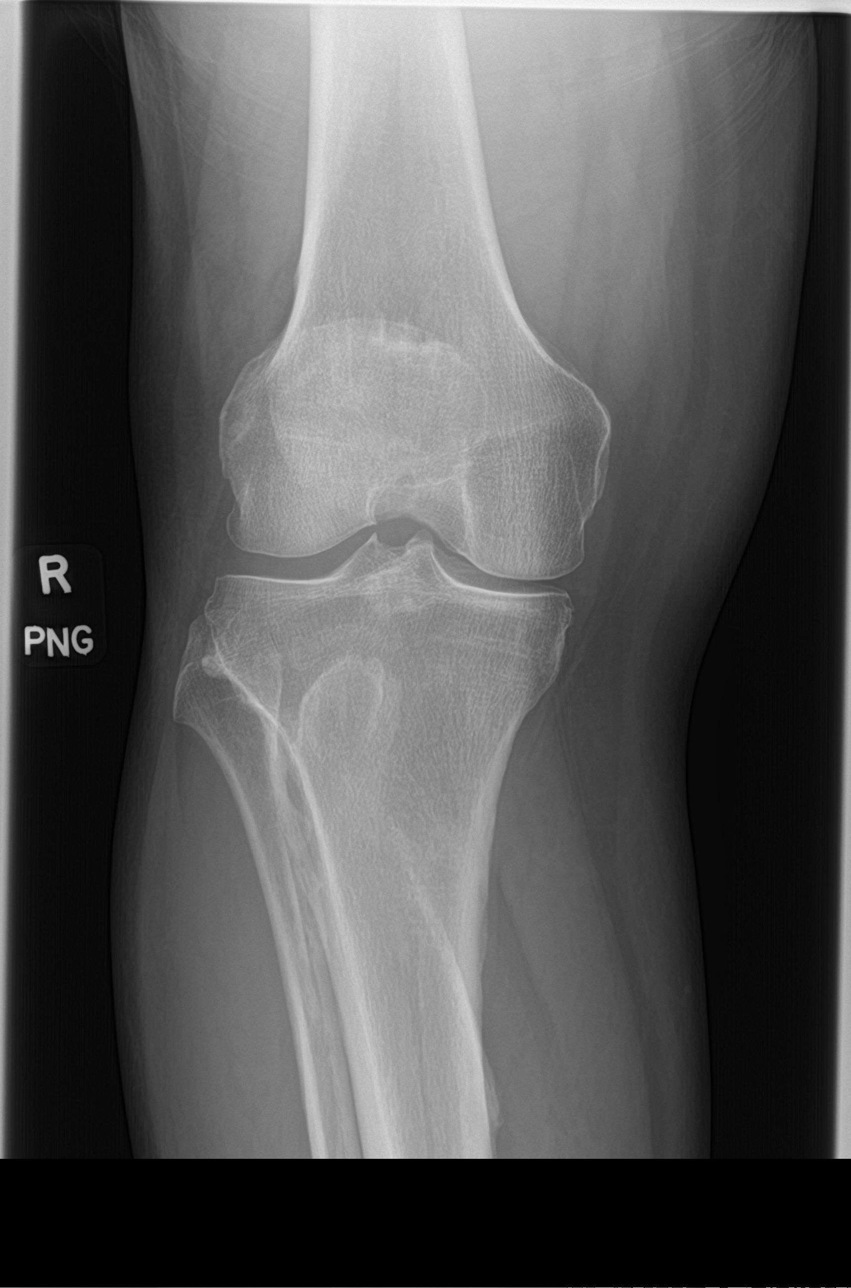

[knee tunnel]
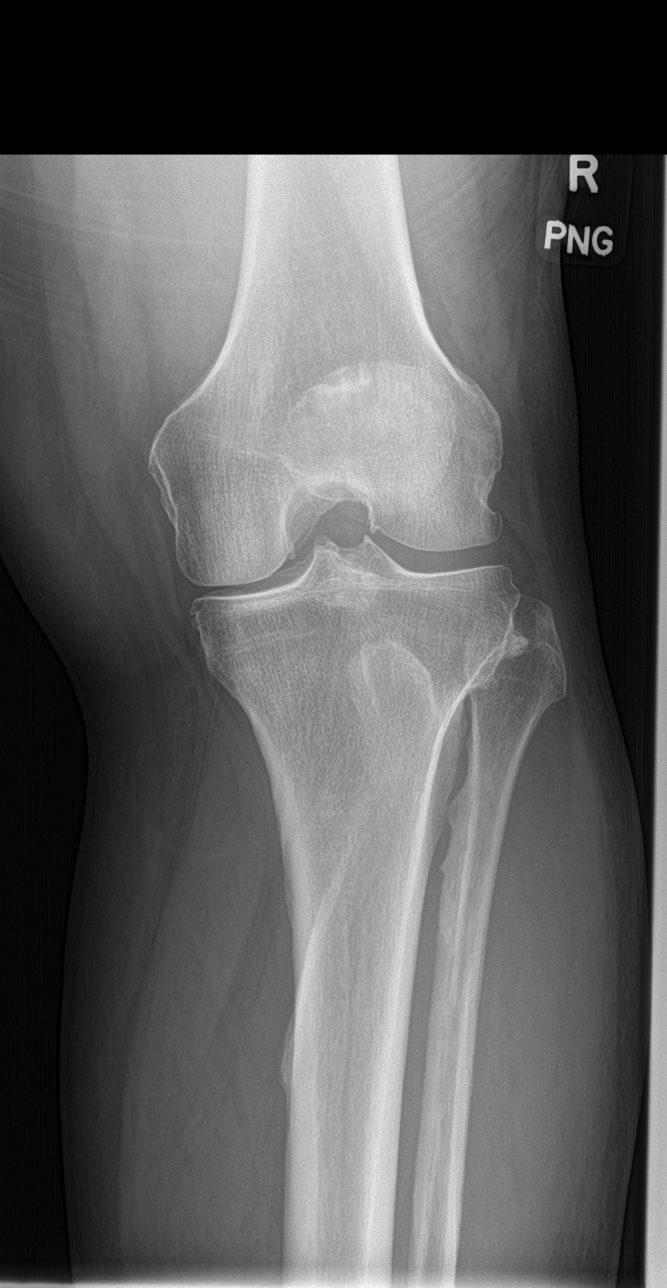

[knee lat]
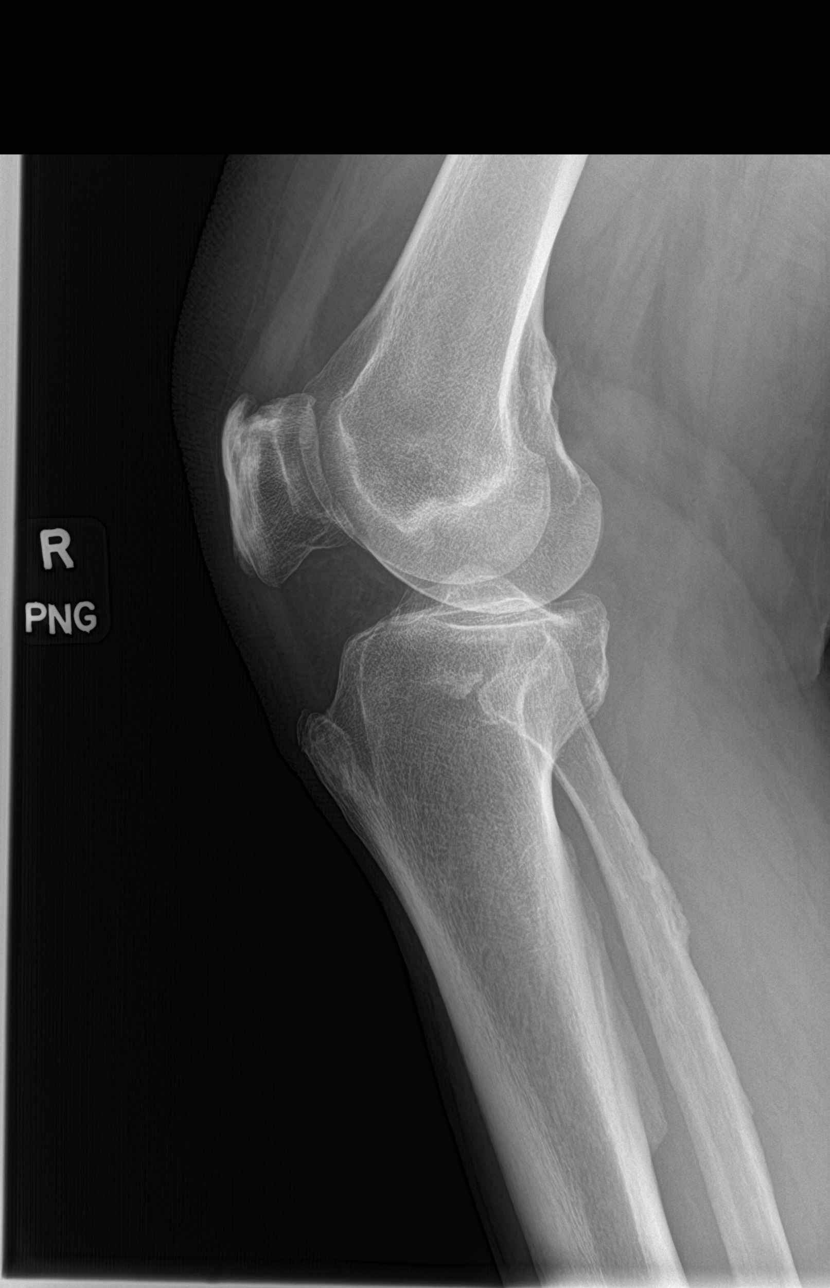

[sunrise]
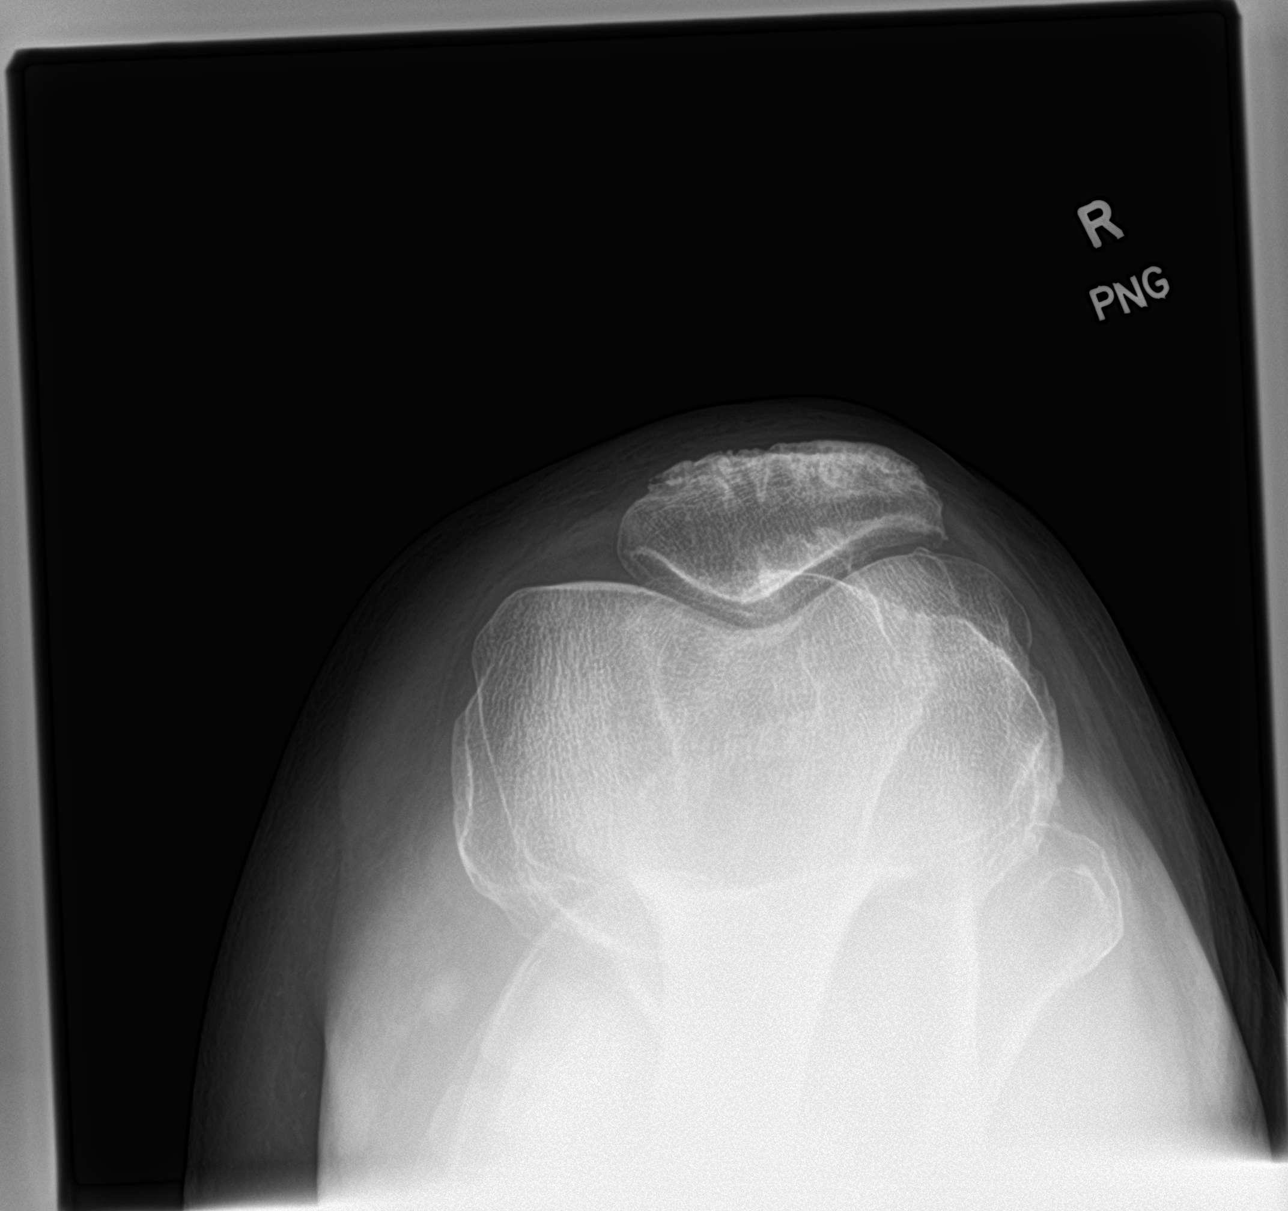

[4 of 4 positions shown; findings below may reference images not displayed]

FINDINGS: No fracture, joint effusion or dislocation. Small superior and tiny
inferior right patellar enthesophytes. Moderate right tibial
tubercle enthesophyte. No suspicious focal osseous lesions. Mild
medial and patellofemoral compartment osteoarthritis. No radiopaque
foreign body.
IMPRESSION: 1. No right knee fracture, joint effusion or dislocation.
2. Mild medial and patellofemoral compartment right knee
osteoarthritis.

## 2017-06-05 NOTE — Assessment & Plan Note (Signed)
Partial tear noted.  No significant changes at this time.  We discussed icing regimen and home exercises.  Patient agrees with physical therapy.  Home exercises given.  Follow-up again in 4 weeks

## 2017-06-05 NOTE — Patient Instructions (Signed)
Good to see you  Edwin [name] is your friend.  pennsaid pinkie amount topically 2 times daily as needed.   We will get xray of the knee downstairs.  Try to do activity straight forward with no twisting, you do have a partial MCL tear Shoulder keep hands within peripheral vision  See me again in 3-4 weeks

## 2017-06-05 NOTE — Assessment & Plan Note (Signed)
Known arthritic changes.  Discussed with patient about topical anti-inflammatories, icing regimen, which activities of doing which wants to avoid.  Patient is in agreement with the plan.  Follow-up with me again in 4-6 weeks

## 2017-06-10 ENCOUNTER — Other Ambulatory Visit: Payer: Self-pay | Admitting: Emergency Medicine

## 2017-06-10 MED ORDER — ZOSTER VAC RECOMB ADJUVANTED 50 MCG/0.5ML IM SUSR: 0.5000 mL | Freq: Once | INTRAMUSCULAR | 0 refills | Status: AC

## 2017-07-03 NOTE — Progress Notes (Signed)
[name] [place], [place] [address] Phone: [phone] Subjective:     CC: Right knee pain follow-up, neck pain follow-up  FBP:[id number]  Edwin [name] is a 72 y.o. male coming in with complaint of right knee pain.  Seen 1 month ago.  Had a partial tear of the MCL. Patient states that he is feeling approximately 90% better.  No instability.  Unable to go up and down stairs well.  Has not been back to the gym yet.  Known to have more of a mild arthritis of the back and neck.  Has been doing exercises.  Doing well with the gabapentin.  Mild left shoulder pain with certain activities but nothing severe.      Past Medical History:  Diagnosis Date  . BPH (benign prostatic hypertrophy)   . Cancer ([place])    Basal cell of face, [name]  . Diverticulosis   . Gilbert syndrome   . Hyperglycemia   . Hyperlipidemia    NMR Lipoprofile 2005; LDL 146 (2007/1330), HDL 38, TG 169.  LDL goal = <100; Framingham Study LDL goal =<130  . Hypertension   . OSA on CPAP   . Tubular adenoma of colon 01/19/08   Past Surgical History:  Procedure Laterality Date  . COLONOSCOPY  2014   neg; [facility]  . COLONOSCOPY W/ POLYPECTOMY  2004 & 2009   Tics; [facility]  . KNEE ARTHROSCOPY     [name], left knee  . TONSILLECTOMY    . WISDOM TOOTH EXTRACTION     Social History   Socioeconomic History  . Marital status: Married    Spouse name: None  . Number of children: 2  . Years of education: 50  . Highest education level: None  Social Needs  . Financial resource strain: None  . Food insecurity - worry: None  . Food insecurity - inability: None  . Transportation needs - medical: None  . Transportation needs - non-medical: None  Occupational History  . Occupation: Pharmacist, hospital // Retired    Fish farm manager: [employer]  Tobacco Use  . Smoking status: Never Smoker  . Smokeless tobacco: Never Used  Substance and Sexual  Activity  . Alcohol use: Yes    Alcohol/week: 0.0 oz    Comment: socially, 3-5 beers/week  . Drug use: No  . Sexual activity: None  Other Topics Concern  . None  Social History Narrative   Fun: Travel, read, movies, concerts    Denies abuse and feels safe at home.    No Known Allergies Family History  Problem Relation Age of Onset  . Diverticulosis Mother   . Stroke Mother        late 34s  . Parkinsonism Mother   . Diabetes Father   . Hypertension Father   . Heart disease Father 51       CABG  . Cancer Brother        lymphoma  . Appendicitis Brother        ruptured  . Asthma Brother        childhood  . Stroke Paternal Grandmother 55  . Heart attack Maternal Grandmother 80       questionable  . Heart attack Maternal Grandfather 57  . Parkinsonism Maternal Grandfather   . Hypertension Sister   . Appendicitis Sister        ruptured  . Colon cancer Neg Hx   . Esophageal cancer Neg Hx   .  Rectal cancer Neg Hx   . Stomach cancer Neg Hx      Past medical history, social, surgical and family history all reviewed in electronic medical record.  No pertanent information unless stated regarding to the chief complaint.   Review of Systems:Review of systems updated and as accurate as of 07/04/17  No headache, visual changes, nausea, vomiting, diarrhea, constipation, dizziness, abdominal pain, skin rash, fevers, chills, night sweats, weight loss, swollen lymph nodes, body aches, joint swelling, muscle aches, chest pain, shortness of breath, mood changes.   Objective  Blood pressure (!) 150/90, pulse 75, height 5\' 9"  (1.753 m), weight 264 lb (119.7 kg), SpO2 94 %. Systems examined below as of 07/04/17   General: No apparent distress alert and oriented x3 mood and affect normal, dressed appropriately.  HEENT: Pupils equal, extraocular movements intact  Respiratory: Patient's speak in full sentences and does not appear short of breath  Cardiovascular: No lower extremity edema,  non tender, no erythema  Skin: Warm dry intact with no signs of infection or rash on extremities or on axial skeleton.  Abdomen: Soft nontender  Neuro: Cranial nerves II through XII are intact, neurovascularly intact in all extremities with 2+ DTRs and 2+ pulses.  Lymph: No lymphadenopathy of posterior or anterior cervical chain or axillae bilaterally.  Gait normal with good balance and coordination.  MSK:  Non tender with full range of motion and good stability and symmetric strength and tone of shoulders, elbows, wrist, hip, and ankles bilaterally.   Knee: Right Normal to inspection with no erythema or effusion or obvious bony abnormalities. Palpation mild pain over the medial joint line ROM full in flexion and extension and lower leg rotation. Ligaments with solid consistent endpoints including ACL, PCL, LCL, very mild laxity compared to the contralateral side Negative Mcmurray's, Apley's, and Thessalonian tests. Non painful patellar compression. Patellar glide with mild to moderate crepitus. Patellar and quadriceps tendons unremarkable. Hamstring and quadriceps strength is normal. Contralateral knee mild arthritic changes but nothing severe.  Limited muscular skeletal ultrasound was performed and interpreted by Lyndal Pulley  Limited ultrasound of the right knee shows the patient's partial tear is improving.  Scar tissue formation noted.  Mild to moderate osteoarthritic changes tricompartmental.    Impression and Recommendations:     This case required medical decision making of moderate complexity.      Note: This dictation was prepared with Dragon dictation along with smaller phrase technology. Any transcriptional errors that result from this process are unintentional.

## 2017-07-04 ENCOUNTER — Encounter: Payer: Self-pay | Admitting: Family Medicine

## 2017-07-04 ENCOUNTER — Ambulatory Visit: Payer: Self-pay

## 2017-07-04 ENCOUNTER — Ambulatory Visit (INDEPENDENT_AMBULATORY_CARE_PROVIDER_SITE_OTHER): Payer: Medicare Other | Admitting: Family Medicine

## 2017-07-04 VITALS — BP 150/90 | HR 75 | Ht 69.0 in | Wt 264.0 lb

## 2017-07-04 DIAGNOSIS — M1711 Unilateral primary osteoarthritis, right knee: Secondary | ICD-10-CM | POA: Diagnosis not present

## 2017-07-04 DIAGNOSIS — S83411A Sprain of medial collateral ligament of right knee, initial encounter: Secondary | ICD-10-CM

## 2017-07-04 DIAGNOSIS — M25561 Pain in right knee: Secondary | ICD-10-CM

## 2017-07-04 NOTE — Assessment & Plan Note (Signed)
Stable.  Discussed with patient in great length about icing regimen, home exercise, which activities to do which wants to avoid.  Patient has made significant progress at this time.  Able to increase activity as tolerated.  Follow-up with me again in 6 weeks if not perfect.

## 2017-07-04 NOTE — Assessment & Plan Note (Signed)
Stable.  No change in management.

## 2017-07-04 NOTE — Patient Instructions (Signed)
Good to see you  You are doing well  Knee should be good in 1 month OK to bike, elliptical swim or machine weights For the shoulder and neck keep hands within peripheral vision  Pennsaid on the shoulder at night for next week then as needed See me again in 6 weeks if not perfect

## 2017-07-10 NOTE — Progress Notes (Addendum)
Subjective:   Edwin [name] is a 72 y.o. male who presents for Medicare Annual/Subsequent preventive examination.  Review of Systems:  No ROS.  Medicare Wellness Visit. Additional risk factors are reflected in the social history.    Sleep patterns: feels rested on waking, gets up 1-2 times nightly to void and sleeps 7-8 hours nightly.   Home Safety/Smoke Alarms: Feels safe in home. Smoke alarms in place.  Living environment; residence and Firearm Safety: 2-story house, no firearms. Lives with wife, no needs for DME, good support system Seat Belt Safety/Bike Helmet: Wears seat belt.   PSA-  Lab Results  Component Value Date   PSA 1.55 07/12/2016   PSA 0.84 06/10/2015   PSA 0.88 05/11/2010       Objective:    Vitals: BP (!) 148/85 (BP Location: Left Arm, Patient Position: Sitting, Cuff Size: Normal)   Pulse 72   Resp 18   Ht 5\' 9"  (1.753 m)   Wt 265 lb (120.2 kg)   SpO2 99%   BMI 39.13 kg/m   Body mass index is 39.13 kg/m.  Advanced Directives 07/11/2017  Does Patient Have a Medical Advance Directive? Yes  Type of Paramedic of [place];Living will  Copy of [place] in Chart? No - copy requested    Tobacco Social History   Tobacco Use  Smoking Status Never Smoker  Smokeless Tobacco Never Used     Counseling given: Not Answered   Past Medical History:  Diagnosis Date  . BPH (benign prostatic hypertrophy)   . Cancer ([place])    Basal cell of face, [name]  . Diverticulosis   . Gilbert syndrome   . Hyperglycemia   . Hyperlipidemia    NMR Lipoprofile 2005; LDL 146 (2007/1330), HDL 38, TG 169.  LDL goal = <100; Framingham Study LDL goal =<130  . Hypertension   . OSA on CPAP   . Tubular adenoma of colon 01/19/08   Past Surgical History:  Procedure Laterality Date  . COLONOSCOPY  2014   neg; [facility]  . COLONOSCOPY W/ POLYPECTOMY  2004 & 2009   Tics; [facility]  . KNEE ARTHROSCOPY     Dr.Andy  Collins, left knee  . TONSILLECTOMY    . WISDOM TOOTH EXTRACTION     Family History  Problem Relation Age of Onset  . Diverticulosis Mother   . Stroke Mother        late 29s  . Parkinsonism Mother   . Diabetes Father   . Hypertension Father   . Heart disease Father 31       CABG  . Cancer Brother        lymphoma  . Appendicitis Brother        ruptured  . Asthma Brother        childhood  . Stroke Paternal Grandmother 31  . Heart attack Maternal Grandmother 80       questionable  . Heart attack Maternal Grandfather 57  . Parkinsonism Maternal Grandfather   . Hypertension Sister   . Appendicitis Sister        ruptured  . Colon cancer Neg Hx   . Esophageal cancer Neg Hx   . Rectal cancer Neg Hx   . Stomach cancer Neg Hx    Social History   Socioeconomic History  . Marital status: Married    Spouse name: Not on file  . Number of children: 2  . Years of education: 39  .  Highest education level: Not on file  Social Needs  . Financial resource strain: Not hard at all  . Food insecurity - worry: Never true  . Food insecurity - inability: Never true  . Transportation needs - medical: No  . Transportation needs - non-medical: No  Occupational History  . 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 Smoking status: Never Smoker  . Smokeless tobacco: Never Used  Substance and Sexual Activity  . Alcohol use: Yes    Alcohol/week: 0.0 oz    Comment: socially, 3-5 beers/week  . Drug use: No  . Sexual activity: Yes  Other Topics Concern  . Not on file  Social History Narrative   Fun: Travel, read, movies, concerts    Denies abuse and feels safe at home.     Outpatient Encounter Medications as of 07/11/2017  Medication Sig  . aspirin 81 MG tablet Take 81 mg by mouth daily.    [name] atorvastatin (LIPITOR) 10 MG tablet Take 1 tablet (10 mg total) by mouth every other day.  . Cholecalciferol (VITAMIN D3) 1000 UNITS CAPS Take 1  capsule by mouth daily.   . Cyanocobalamin (VITAMIN B-12 PO) Take by mouth as directed.  . metoprolol succinate (TOPROL-XL) 50 MG 24 hr tablet TAKE 1 TABLET ONCE DAILY. TAKE WITH OR IMMEDIATELY FOLLOWING A MEAL.  . Multiple Vitamin (MULTIVITAMIN) tablet Take 1 tablet by mouth daily.    . naproxen sodium (ALEVE) 220 MG tablet Take 220 mg by mouth daily as needed.  . nitroGLYCERIN (NITROSTAT) 0.4 MG SL tablet Place 1 tablet (0.4 mg total) under the tongue every 5 (five) minutes as needed for chest pain.  . Omega-3 Fatty Acids (OMEGA 3 PO) Take 1 capsule by mouth daily.   . [DISCONTINUED] azithromycin (ZITHROMAX) 250 MG tablet Take two tabs the first day and then one tab daily for four days (Patient not taking: Reported on 07/11/2017)  . [DISCONTINUED] gabapentin (NEURONTIN) 100 MG capsule Take 2 capsules (200 mg total) by mouth at bedtime. (Patient not taking: Reported on 07/11/2017)   No facility-administered encounter medications on file as of 07/11/2017.     Activities of Daily Living In your present state of health, do you have any difficulty performing the following activities: 07/11/2017  Hearing? N  Vision? N  Difficulty concentrating or making decisions? N  Walking or climbing stairs? N  Dressing or bathing? N  Doing errands, shopping? N  Preparing Food and eating ? N  Using the Toilet? N  In the past six months, have you accidently leaked urine? N  Do you have problems with loss of bowel control? N  Managing your Medications? N  Managing your Finances? N  Housekeeping or managing your Housekeeping? N  Some recent data might be hidden    Patient Care Team: [name], MD as PCP - General (Internal Medicine)   Assessment:   This is a routine wellness examination for Edwin [name].  Exercise Activities and Dietary recommendations Current Exercise Habits: Structured exercise class, Type of exercise: walking;treadmill;calisthenics, Time (Minutes): 45, Frequency (Times/Week): 4, Weekly  Exercise (Minutes/Week): 180, Intensity: Mild  Diet (meal preparation, eat out, water intake, caffeinated beverages, dairy products, fruits and vegetables): in general, a "healthy" diet  , well balanced   Reviewed heart healthy diet, discussed reading food labels, using portion control, decreasing carbohydrates and eating high protein foods. encouraged patient to increase daily water intake. Diet education was attached to patient's AVS.  Relevant patient education assigned to patient using Emmi.   Goals    . Patient Stated     Get consistent with exercise, I feel I can go to the gym pretty consistently 3 times weekly. Begin to monitor my diet by using portion control. Splitting dinners with my wife when we go out to eat.        Fall Risk Fall Risk  07/11/2017 06/02/2014 05/22/2013  Falls in the past year? No No No    Depression Screen PHQ 2/9 Scores 07/11/2017 06/07/2015 06/02/2014 05/22/2013  PHQ - 2 Score 1 0 0 0  PHQ- 9 Score 1 0 - -    Cognitive Function       Ad8 score reviewed for issues:  Issues making decisions: no  Less interest in hobbies / activities: no  Repeats questions, stories (family complaining): no  Trouble using ordinary gadgets (microwave, computer, phone):no  Forgets the month or year: no  Mismanaging finances: no  Remembering appts: no  Daily problems with thinking and/or memory: no Ad8 score is= 0  Immunization History  Administered Date(s) Administered  . Influenza, High Dose Seasonal PF 05/22/2013  . Influenza, Seasonal, Injecte, Preservative Fre 05/20/2012  . Influenza,inj,Quad PF,6+ Mos 03/02/2014, 05/19/2015  . Influenza-Unspecified 01/16/2017  . Pneumococcal Conjugate-13 06/07/2015  . Pneumococcal Polysaccharide-23 04/13/2014  . Td 05/11/2010  . Zoster 12/23/2007  . Zoster Recombinat (Shingrix) 01/16/2017   Screening Tests Health Maintenance  Topic Date Due  . TETANUS/TDAP  05/11/2020  . COLONOSCOPY  02/03/2023  . INFLUENZA VACCINE   Completed  . Hepatitis C Screening  Completed  . PNA vac Low Risk Adult  Completed      Plan:    Continue doing brain stimulating activities (puzzles, reading, adult coloring books, staying active) to keep memory sharp.   Continue to eat heart healthy diet (full of fruits, vegetables, whole grains, lean protein, water--limit salt, fat, and sugar intake) and increase physical activity as tolerated.  I have personally reviewed and noted the following in the patient's chart:   . Medical and social history . Use of alcohol, tobacco or illicit drugs  . Current medications and supplements . Functional ability and status . Nutritional status . Physical activity . Advanced directives . List of other physicians . Vitals . Screenings to include cognitive, depression, and falls . Referrals and appointments  In addition, I have reviewed and discussed with patient certain preventive protocols, quality metrics, and best practice recommendations. A written personalized care plan for preventive services as well as general preventive health recommendations were provided to patient.     Michiel Cowboy, RN  07/11/2017   Medical screening examination/treatment/procedure(s) were performed by non-physician practitioner and as supervising physician I was immediately available for consultation/collaboration. I agree with above. [name], MD

## 2017-07-11 ENCOUNTER — Ambulatory Visit (INDEPENDENT_AMBULATORY_CARE_PROVIDER_SITE_OTHER): Payer: Medicare Other | Admitting: *Deleted

## 2017-07-11 VITALS — BP 148/85 | HR 72 | Resp 18 | Ht 69.0 in | Wt 265.0 lb

## 2017-07-11 DIAGNOSIS — Z0000 Encounter for general adult medical examination without abnormal findings: Secondary | ICD-10-CM | POA: Diagnosis not present

## 2017-07-11 NOTE — Patient Instructions (Signed)
Continue doing brain stimulating activities (puzzles, reading, adult coloring books, staying active) to keep memory sharp.   Continue to eat heart healthy diet (full of fruits, vegetables, whole grains, lean protein, water--limit salt, fat, and sugar intake) and increase physical activity as tolerated.   [name] , Thank you for taking time to come for your Medicare Wellness Visit. I appreciate your ongoing commitment to your health goals. Please review the following plan we discussed and let me know if I can assist you in the future.   These are the goals we discussed: Goals    . Patient Stated     Get consistent with exercise, I feel I can go to the gym pretty consistently 3 times weekly. Begin to monitor my diet by using portion control. Splitting dinners with my wife when we go out to eat.        This is a list of the screening recommended for you and due dates:  Health Maintenance  Topic Date Due  . Tetanus Vaccine  05/11/2020  . Colon Cancer Screening  02/03/2023  . Flu Shot  Completed  .  Hepatitis C: One time screening is recommended by Center for Disease Control  (CDC) for  adults born from [birth year] through 1963.   Completed  . Pneumonia vaccines  Completed    Protein Content in Foods Generally, most healthy people need around 50 grams of protein each day. Depending on your overall health, you may need more or less protein in your diet. Talk to your health care provider or dietitian about how much protein you need. See the following list for the protein content of some common foods. High-protein foods High-protein foods contain 4 grams (4 g) or more of protein per serving. They include:  Beef, ground sirloin (cooked) - 3 oz have 24 g of protein.  Cheese (hard) - 1 oz has 7 g of protein.  Chicken breast, boneless and skinless (cooked) - 3 oz have 13.4 g of protein.  Cottage cheese - 1/2 cup has 13.4 g of protein.  Egg - 1 egg has 6 g of protein.  Fish, filet (cooked) -  1 oz has 6-7 g of protein.  Garbanzo beans (canned or cooked) - 1/2 cup has 6-7 g of protein.  Kidney beans (canned or cooked) - 1/2 cup has 6-7 g of protein.  Lamb (cooked) - 3 oz has 24 g of protein.  Milk - 1 cup (8 oz) has 8 g of protein.  Nuts (peanuts, pistachios, almonds) - 1 oz has 6 g of protein.  Peanut butter - 1 oz has 7-8 g of protein.  Pork tenderloin (cooked) - 3 oz has 18.4 g of protein.  Pumpkin seeds - 1 oz has 8.5 g of protein.  Soybeans (roasted) - 1 oz has 8 g of protein.  Soybeans (cooked) - 1/2 cup has 11 g of protein.  Soy milk - 1 cup (8 oz) has 5-10 g of protein.  Soy or vegetable patty - 1 patty has 11 g of protein.  Sunflower seeds - 1 oz has 5.5 g of protein.  Tofu (firm) - 1/2 cup has 20 g of protein.  Tuna (canned in water) - 3 oz has 20 g of protein.  Yogurt - 6 oz has 8 g of protein.  Low-protein foods Low-protein foods contain 3 grams (3 g) or less of protein per serving. They include:  Beets (raw or cooked) - 1/2 cup has 1.5 g of protein.  Bran cereal -  1/2 cup has 2-3 g of protein.  Bread - 1 slice has 2.5 g of protein.  Broccoli (raw or cooked) - 1/2 cup has 2 g of protein.  Collard greens (raw or cooked) - 1/2 cup has 2 g of protein.  Corn (fresh or cooked) - 1/2 cup has 2 g of protein.  Cream cheese - 1 oz has 2 g of protein.  Creamer (half-and-half) - 1 oz has 1 g of protein.  Flour tortilla - 1 tortilla has 2.5 g of protein  Frozen yogurt - 1/2 cup has 3 g of protein.  Fruit or vegetable juice - 1/2 cup has 1 g of protein.  Green beans (raw or cooked) - 1/2 cup has 1 g of protein.  Green peas (canned) - 1/2 cup has 3.5 g of protein.  Muffins - 1 small muffin (2 oz) has 3 g of protein.  Oatmeal (cooked) - 1/2 cup has 3 g of protein.  Potato (baked with skin) - 1 medium potato has 3 g of protein.  Rice (cooked) - 1/2 cup has 2.5-3.5 g of protein.  Sour cream - 1/2 cup has 2.5 g of protein.  Spinach  (cooked) - 1/2 cup has 3 g of protein.  Squash (cooked) - 1/2 cup has 1.5 g of protein.  Actual amounts of protein may be different depending on processing. Talk with your health care provider or dietitian about what foods are recommended for you. This information is not intended to replace advice given to you by your health care provider. Make sure you discuss any questions you have with your health care provider. Document Released: 07/23/2015 Document Revised: 01/02/2016 Document Reviewed: 01/02/2016 Elsevier Interactive Patient Education  [employer].

## 2017-07-29 DIAGNOSIS — L570 Actinic keratosis: Secondary | ICD-10-CM | POA: Diagnosis not present

## 2017-07-29 DIAGNOSIS — D225 Melanocytic nevi of trunk: Secondary | ICD-10-CM | POA: Diagnosis not present

## 2017-07-29 DIAGNOSIS — L820 Inflamed seborrheic keratosis: Secondary | ICD-10-CM | POA: Diagnosis not present

## 2017-07-29 DIAGNOSIS — D3612 Benign neoplasm of peripheral nerves and autonomic nervous system, upper limb, including shoulder: Secondary | ICD-10-CM | POA: Diagnosis not present

## 2017-07-29 DIAGNOSIS — Z85828 Personal history of other malignant neoplasm of skin: Secondary | ICD-10-CM | POA: Diagnosis not present

## 2017-07-29 DIAGNOSIS — D1801 Hemangioma of skin and subcutaneous tissue: Secondary | ICD-10-CM | POA: Diagnosis not present

## 2017-07-29 DIAGNOSIS — L821 Other seborrheic keratosis: Secondary | ICD-10-CM | POA: Diagnosis not present

## 2017-08-12 NOTE — Progress Notes (Signed)
[name] [place], [place] [address] Phone: [phone] Subjective:     CC: Knee pain follow-up, right shoulder pain follow-up  OAC:[id number]  Edwin [name] is a 72 y.o. male coming in with complaint of knee pain.  Had a partial MCL tear.  States that he is 99% better.  Feels like he is doing relatively well with no significant difficulty.  Continues to have right shoulder pain though.  Seems to be worse when he lays on it at night.  Certain movements like dressing or reaching across his body can cause sharp pain that lasts seconds and then seems to resolve.  Denies any weakness.  No radiation down the arm.  No associated neck pain.  Responds to changing positions.    Past Medical History:  Diagnosis Date  . BPH (benign prostatic hypertrophy)   . Cancer ([place])    Basal cell of face, [name]  . Diverticulosis   . Gilbert syndrome   . Hyperglycemia   . Hyperlipidemia    NMR Lipoprofile 2005; LDL 146 (2007/1330), HDL 38, TG 169.  LDL goal = <100; Framingham Study LDL goal =<130  . Hypertension   . OSA on CPAP   . Tubular adenoma of colon 01/19/08   Past Surgical History:  Procedure Laterality Date  . COLONOSCOPY  2014   neg; [facility]  . COLONOSCOPY W/ POLYPECTOMY  2004 & 2009   Tics; [facility]  . KNEE ARTHROSCOPY     [name], left knee  . TONSILLECTOMY    . WISDOM TOOTH EXTRACTION     Social History   Socioeconomic History  . Marital status: Married    Spouse name: Not on file  . Number of children: 2  . Years of education: 8  . Highest education level: Not on file  Occupational History  . 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 Financial resource strain: Not hard at all  . Food insecurity:    Worry: Never true    Inability: Never true  . Transportation needs:    Medical: No    Non-medical: No  Tobacco Use  . Smoking status: Never Smoker    . Smokeless tobacco: Never Used  Substance and Sexual Activity  . Alcohol use: Yes    Alcohol/week: 0.0 oz    Comment: socially, 3-5 beers/week  . Drug use: No  . Sexual activity: Yes  Lifestyle  . Physical activity:    Days per week: 3 days    Minutes per session: 40 min  . Stress: Only a little  Relationships  . Social connections:    Talks on phone: More than three times a week    Gets together: More than three times a week    Attends religious service: More than 4 times per year    Active member of club or organization: Not on file    Attends meetings of clubs or organizations: More than 4 times per year    Relationship status: Married  Other Topics Concern  . Not on file  Social History Narrative   Fun: Travel, read, movies, concerts    Denies abuse and feels safe at home.    No Known Allergies Family History  Problem Relation Age of Onset  . Diverticulosis Mother   . Stroke Mother        late 85s  . Parkinsonism Mother   . Diabetes  Father   . Hypertension Father   . Heart disease Father 76       CABG  . Cancer Brother        lymphoma  . Appendicitis Brother        ruptured  . Asthma Brother        childhood  . Stroke Paternal Grandmother 40  . Heart attack Maternal Grandmother 80       questionable  . Heart attack Maternal Grandfather 57  . Parkinsonism Maternal Grandfather   . Hypertension Sister   . Appendicitis Sister        ruptured  . Colon cancer Neg Hx   . Esophageal cancer Neg Hx   . Rectal cancer Neg Hx   . Stomach cancer Neg Hx      Past medical history, social, surgical and family history all reviewed in electronic medical record.  No pertanent information unless stated regarding to the chief complaint.   Review of Systems:Review of systems updated and as  No headache, visual changes, nausea, vomiting, diarrhea, constipation, dizziness, abdominal pain, skin rash, fevers, chills, night sweats, weight loss, swollen lymph nodes, body aches,  joint swelling, , chest pain, shortness of breath, mood changes.  Positive muscle aches  Objective  Blood pressure 130/84, pulse 80, height 5\' 9"  (1.753 m), weight 263 lb (119.3 kg), SpO2 97 %.   General: No apparent distress alert and oriented x3 mood and affect normal, dressed appropriately.  HEENT: Pupils equal, extraocular movements intact  Respiratory: Patient's speak in full sentences and does not appear short of breath  Cardiovascular: No lower extremity edema, non tender, no erythema  Skin: Warm dry intact with no signs of infection or rash on extremities or on axial skeleton.  Abdomen: Soft nontender  Neuro: Cranial nerves II through XII are intact, neurovascularly intact in all extremities with 2+ DTRs and 2+ pulses.  Lymph: No lymphadenopathy of posterior or anterior cervical chain or axillae bilaterally.  Gait normal with good balance and coordination.  MSK:  Non tender with full range of motion and good stability and symmetric strength and tone of  elbows, wrist, hip, knee and ankles bilaterally.   Knee exam shows the patient does have some mild tenderness to palpation of the medial joint line but no gapping of the MCL.  No swelling noted.  Full range of motion.  Right shoulder has positive crossover test.  Pain over the right acromioclavicular joint.  Mild impingement noted.  Mild positive apprehension.  Full range of motion and full strength noted.  After verbal consent patient was prepped with alcohol swabs and with a 25-gauge half inch needle injected with 1 cc of 0.5% Marcaine and 1 cc of Kenalog 40 mg/mL into the right acromioclavicular joint.  No blood loss.  Band-Aid placed.  Postinjection instructions given    Impression and Recommendations:     This case required medical decision making of moderate complexity.      Note: This dictation was prepared with Dragon dictation along with smaller phrase technology. Any transcriptional errors that result from this process  are unintentional.

## 2017-08-13 ENCOUNTER — Ambulatory Visit (INDEPENDENT_AMBULATORY_CARE_PROVIDER_SITE_OTHER): Payer: Medicare Other | Admitting: Family Medicine

## 2017-08-13 ENCOUNTER — Encounter: Payer: Self-pay | Admitting: Family Medicine

## 2017-08-13 VITALS — BP 130/84 | HR 80 | Ht 69.0 in | Wt 263.0 lb

## 2017-08-13 DIAGNOSIS — G8929 Other chronic pain: Secondary | ICD-10-CM

## 2017-08-13 DIAGNOSIS — S83411A Sprain of medial collateral ligament of right knee, initial encounter: Secondary | ICD-10-CM

## 2017-08-13 DIAGNOSIS — M19011 Primary osteoarthritis, right shoulder: Secondary | ICD-10-CM | POA: Diagnosis not present

## 2017-08-13 DIAGNOSIS — M25511 Pain in right shoulder: Secondary | ICD-10-CM | POA: Diagnosis not present

## 2017-08-13 NOTE — Patient Instructions (Signed)
Good to see you  Xray downstairs Injected the [facility] joint on the right side Exercises 3 times a week.  pennsaid pinkie amount topically 2 times daily as needed.  Still try during the day to keep hands within peripheral vision.  For the back I would rotate the mattress.   See me again in 3-6 weeks if not better or if back is still bothering you.

## 2017-08-14 ENCOUNTER — Ambulatory Visit (INDEPENDENT_AMBULATORY_CARE_PROVIDER_SITE_OTHER)
Admission: RE | Admit: 2017-08-14 | Discharge: 2017-08-14 | Disposition: A | Discharge status: Home / Self Care | Payer: Medicare Other | Source: Ambulatory Visit | Attending: Family Medicine | Admitting: Family Medicine

## 2017-08-14 DIAGNOSIS — M25511 Pain in right shoulder: Secondary | ICD-10-CM | POA: Diagnosis not present

## 2017-08-14 DIAGNOSIS — G8929 Other chronic pain: Secondary | ICD-10-CM | POA: Diagnosis not present

## 2017-08-14 DIAGNOSIS — M19019 Primary osteoarthritis, unspecified shoulder: Secondary | ICD-10-CM | POA: Insufficient documentation

## 2017-08-14 IMAGING — DX DG SHOULDER 2+V*R*
3 series · 3 of 3 positions shown · non-contrast
Comparison: Limited views of the right shoulder from a chest x-ray
[DATE]

CLINICAL DATA: Intermittent anterior right shoulder pain for the
past 2 months with no known injury.

EXAM:
RIGHT SHOULDER - 2+ VIEW

[grashey]
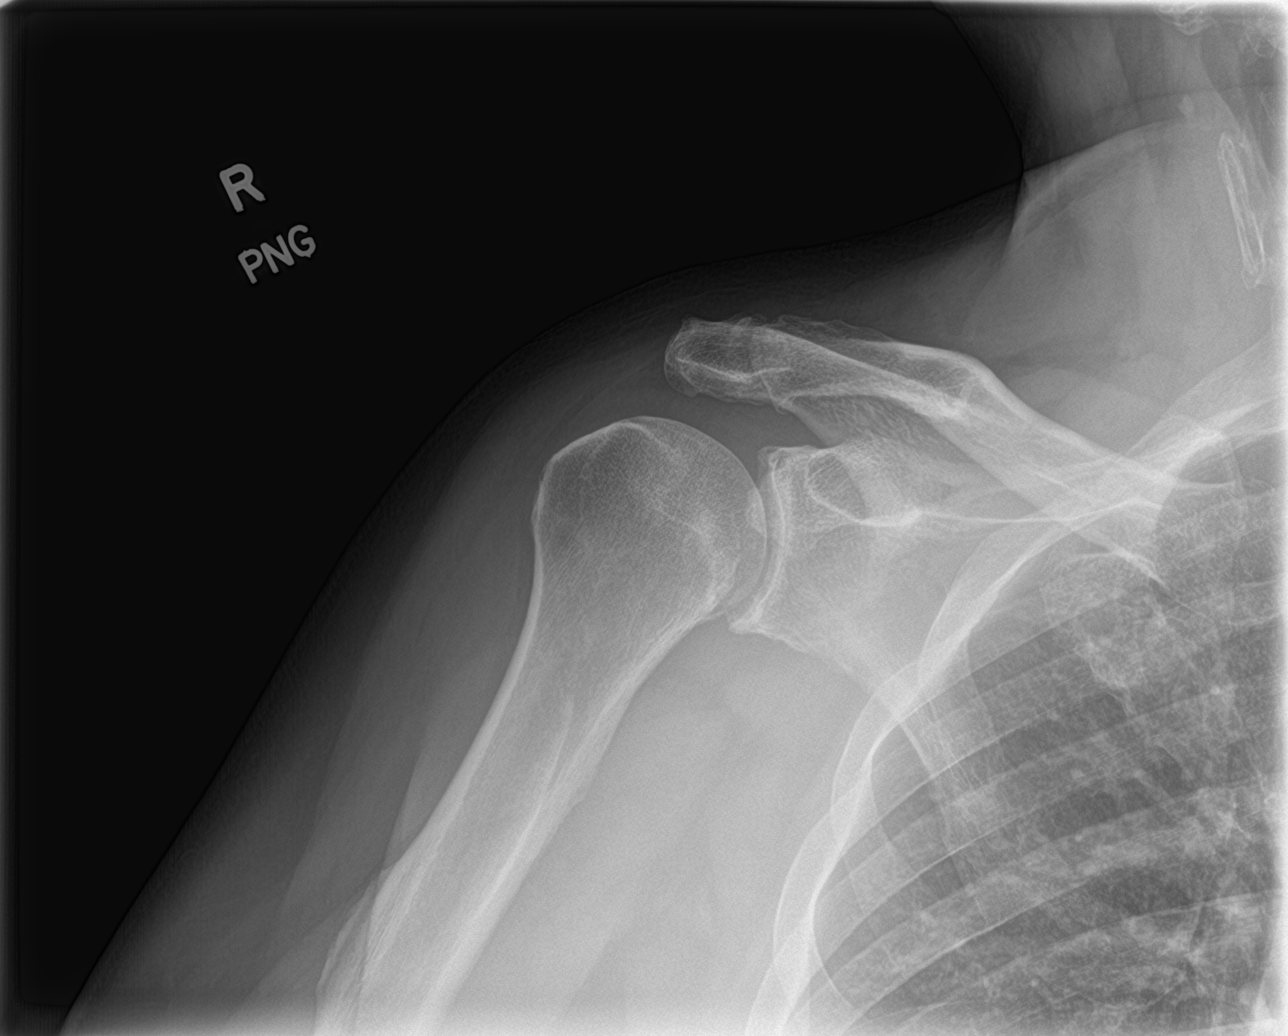

[y view]
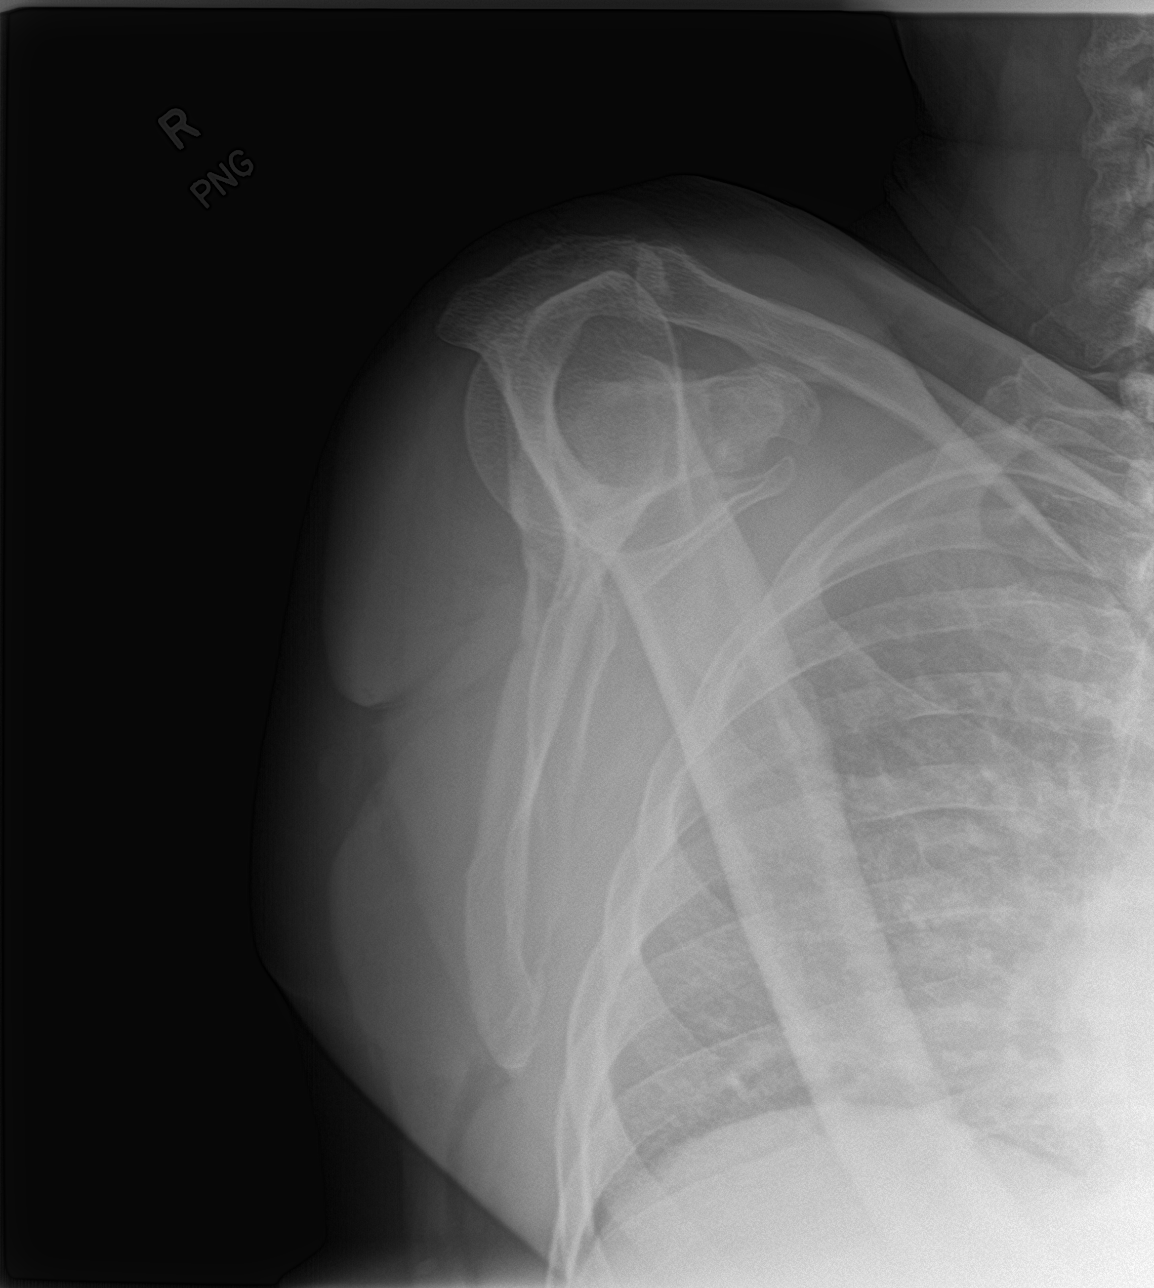

[shoulder axial]
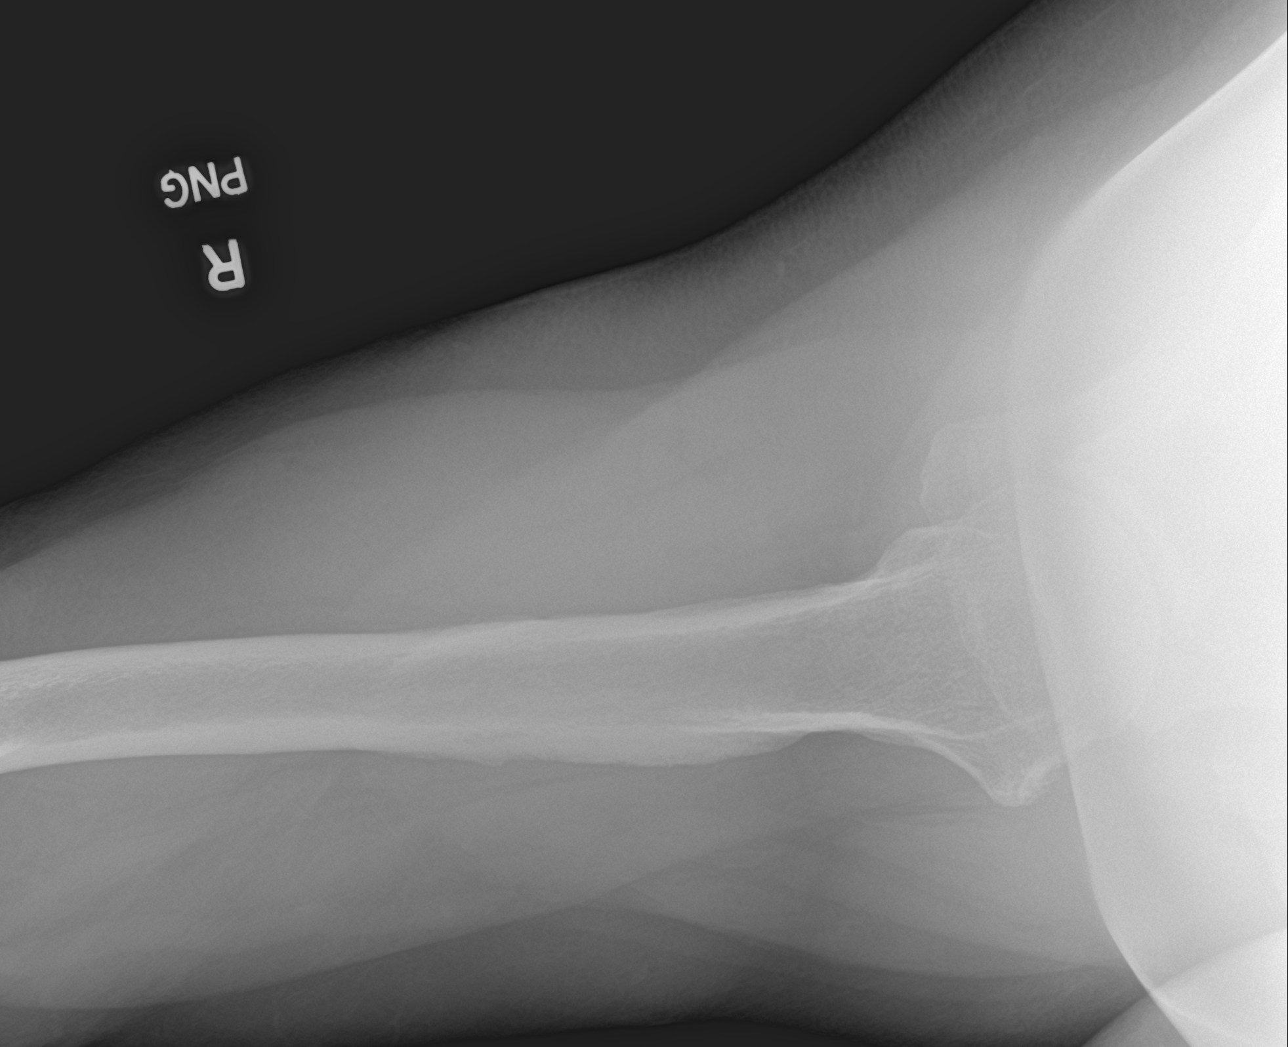

[3 of 3 positions shown; findings below may reference images not displayed]

FINDINGS: The bones are subjectively adequately mineralized. There is no acute
or healing fracture. There is mild narrowing of the glenohumeral
joint space. There is some irregularity of the AC joint involving
the articular surface of the acromion. The subacromial subdeltoid
space is grossly normal.
IMPRESSION: There is no acute bony abnormality of the right shoulder. Mild
degenerative change of the glenohumeral and AC joints is observed.

## 2017-08-14 NOTE — Assessment & Plan Note (Signed)
Improved and nearly completely resolved.

## 2017-08-14 NOTE — Assessment & Plan Note (Signed)
Given injection.  Tolerated procedure well.  Discussed icing regimen and home exercises.  Discussed which activities of doing which wants to avoid.  Topical anti-inflammatories given.  Follow-up again in 4-6 weeks

## 2017-08-16 ENCOUNTER — Ambulatory Visit: Payer: Self-pay | Admitting: Internal Medicine

## 2017-08-20 ENCOUNTER — Encounter: Payer: Self-pay | Admitting: Internal Medicine

## 2017-08-20 ENCOUNTER — Ambulatory Visit (INDEPENDENT_AMBULATORY_CARE_PROVIDER_SITE_OTHER): Payer: Medicare Other | Admitting: Internal Medicine

## 2017-08-20 VITALS — BP 132/72 | HR 68 | Temp 98.1°F | Resp 16 | Wt 261.0 lb

## 2017-08-20 DIAGNOSIS — M545 Low back pain, unspecified: Secondary | ICD-10-CM

## 2017-08-20 DIAGNOSIS — I10 Essential (primary) hypertension: Secondary | ICD-10-CM

## 2017-08-20 DIAGNOSIS — R7303 Prediabetes: Secondary | ICD-10-CM

## 2017-08-20 DIAGNOSIS — E782 Mixed hyperlipidemia: Secondary | ICD-10-CM

## 2017-08-20 DIAGNOSIS — Z125 Encounter for screening for malignant neoplasm of prostate: Secondary | ICD-10-CM | POA: Diagnosis not present

## 2017-08-20 NOTE — Patient Instructions (Addendum)
  Test(s) ordered today. Your results will be released to MyChart (or called to you) after review, usually within 72hours after test completion. If any changes need to be made, you will be notified at that same time.  Medications reviewed and updated.  No changes recommended at this time.    Please followup in 6 months   

## 2017-08-20 NOTE — Assessment & Plan Note (Signed)
Right lower back pain - has pain when he wakes up only - no pain during the day Will see Dr Tamala Julian

## 2017-08-20 NOTE — Assessment & Plan Note (Signed)
BP well controlled Current regimen effective and well tolerated Continue current medications at current doses Cmp, tsh 

## 2017-08-20 NOTE — Assessment & Plan Note (Signed)
Check a1c Low sugar / carb diet Stressed regular exercise, weight loss  

## 2017-08-20 NOTE — Assessment & Plan Note (Signed)
Check lipid panel  Continue daily statin Regular exercise and healthy diet encouraged  

## 2017-08-20 NOTE — Progress Notes (Signed)
Subjective:    Patient ID: Edwin [name], male    DOB: 12/06/1945, 72 y.o.   MRN: [id number]  HPI The patient is here for follow up.  Hyperlipidemia: He is taking his medication daily. He is compliant with a low fat/cholesterol diet. [facility] He denies myalgias.   Hypertension: He is taking his medication daily. He is compliant with a low sodium diet.  He denies chest pain, palpitations, edema, shortness of breath and regular headaches. He is exercising regularly.  He does not monitor his blood pressure at home.    Prediabetes:  He is compliant with a low sugar/carbohydrate diet.  He is exercising regularly.  Lower back pain;  He has lower back pain when he wakes up in the morning.  His bed is less than one year old.  He denies pain during the day. There is no pain in legs or numbness/tingling.  [facility] and [facility].      Medications and allergies reviewed with patient and updated if appropriate.  Patient Active Problem List   Diagnosis Date Noted  . AC (acromioclavicular) arthritis 08/14/2017  . Tear of MCL (medial collateral ligament) of knee, right, initial encounter 06/05/2017  . Piriformis syndrome of right side 02/19/2017  . Lower back pain 02/15/2017  . Prediabetes 02/14/2017  . Patellofemoral arthritis of right knee 12/30/2014  . Obesity 11/18/2014  . Nonspecific abnormal electrocardiogram (ECG) (EKG) 08/18/2014  . OSA (obstructive sleep apnea) 06/02/2014  . Achilles tendinosis 12/26/2012  . RBBB 05/20/2012  . Hyperlipidemia 03/03/2008  . Essential hypertension 03/03/2008  . History of colonic polyps 03/03/2008  . History of basal cell cancer 05/26/2007  . GILBERT'S SYNDROME 05/26/2007  . DIVERTICULOSIS, COLON 05/26/2007  . BPH (benign prostatic hyperplasia) 05/26/2007    Current Outpatient Medications on File Prior to Visit  Medication Sig Dispense Refill  . aspirin 81 MG tablet Take 81 mg by mouth daily.      [name]  atorvastatin (LIPITOR) 10 MG tablet Take 1 tablet (10 mg total) by mouth every other day. 45 tablet 2  . Cholecalciferol (VITAMIN D3) 1000 UNITS CAPS Take 1 capsule by mouth daily.     . Cyanocobalamin (VITAMIN B-12 PO) Take by mouth as directed.    . metoprolol succinate (TOPROL-XL) 50 MG 24 hr tablet TAKE 1 TABLET ONCE DAILY. TAKE WITH OR IMMEDIATELY FOLLOWING A MEAL. 90 tablet 3  . Multiple Vitamin (MULTIVITAMIN) tablet Take 1 tablet by mouth daily.      . naproxen sodium (ALEVE) 220 MG tablet Take 220 mg by mouth daily as needed.    . nitroGLYCERIN (NITROSTAT) 0.4 MG SL tablet Place 1 tablet (0.4 mg total) under the tongue every 5 (five) minutes as needed for chest pain. 25 tablet 5  . Omega-3 Fatty Acids (OMEGA 3 PO) Take 1 capsule by mouth daily.      No current facility-administered medications on file prior to visit.     Past Medical History:  Diagnosis Date  . BPH (benign prostatic hypertrophy)   . Cancer ([place])    Basal cell of face, [name]  . Diverticulosis   . Gilbert syndrome   . Hyperglycemia   . Hyperlipidemia    NMR Lipoprofile 2005; LDL 146 (2007/1330), HDL 38, TG 169.  LDL goal = <100; Framingham Study LDL goal =<130  . Hypertension   . OSA on CPAP   . Tubular adenoma of colon 01/19/08  Past Surgical History:  Procedure Laterality Date  . COLONOSCOPY  2014   neg; [facility]  . COLONOSCOPY W/ POLYPECTOMY  2004 & 2009   Tics; [facility]  . KNEE ARTHROSCOPY     [name], left knee  . TONSILLECTOMY    . WISDOM TOOTH EXTRACTION      Social History   Socioeconomic History  . Marital status: Married    Spouse name: Not on file  . Number of children: 2  . Years of education: 75  . Highest education level: Not on file  Occupational History  . 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 Financial resource strain: Not hard at all  . Food insecurity:    Worry: Never true    Inability:  Never true  . Transportation needs:    Medical: No    Non-medical: No  Tobacco Use  . Smoking status: Never Smoker  . Smokeless tobacco: Never Used  Substance and Sexual Activity  . Alcohol use: Yes    Alcohol/week: 0.0 oz    Comment: socially, 3-5 beers/week  . Drug use: No  . Sexual activity: Yes  Lifestyle  . Physical activity:    Days per week: 3 days    Minutes per session: 40 min  . Stress: Only a little  Relationships  . Social connections:    Talks on phone: More than three times a week    Gets together: More than three times a week    Attends religious service: More than 4 times per year    Active member of club or organization: Not on file    Attends meetings of clubs or organizations: More than 4 times per year    Relationship status: Married  Other Topics Concern  . Not on file  Social History Narrative   Fun: Travel, read, movies, concerts    Denies abuse and feels safe at home.     Family History  Problem Relation Age of Onset  . Diverticulosis Mother   . Stroke Mother        late 45s  . Parkinsonism Mother   . Diabetes Father   . Hypertension Father   . Heart disease Father 7       CABG  . Cancer Brother        lymphoma  . Appendicitis Brother        ruptured  . Asthma Brother        childhood  . Stroke Paternal Grandmother 46  . Heart attack Maternal Grandmother 80       questionable  . Heart attack Maternal Grandfather 57  . Parkinsonism Maternal Grandfather   . Hypertension Sister   . Appendicitis Sister        ruptured  . Colon cancer Neg Hx   . Esophageal cancer Neg Hx   . Rectal cancer Neg Hx   . Stomach cancer Neg Hx     Review of Systems  Constitutional: Negative for fever.  Respiratory: Negative for cough, shortness of breath and wheezing.   Cardiovascular: Negative for chest pain, palpitations and leg swelling.  Musculoskeletal: Positive for back pain.  Neurological: Negative for weakness, light-headedness, numbness and  headaches.       Objective:   Vitals:   08/20/17 1417  BP: 132/72  Pulse: 68  Resp: 16  Temp: 98.1 F (36.7 C)  SpO2: 96%   BP Readings from Last 3 Encounters:  08/20/17  132/72  08/13/17 130/84  07/11/17 (!) 148/85   Wt Readings from Last 3 Encounters:  08/20/17 261 lb (118.4 kg)  08/13/17 263 lb (119.3 kg)  07/11/17 265 lb (120.2 kg)   Body mass index is 38.54 kg/m.   Physical Exam    Constitutional: Appears well-developed and well-nourished. No distress.  HENT:  Head: Normocephalic and atraumatic.  Neck: Neck supple. No tracheal deviation present. No thyromegaly present.  No cervical lymphadenopathy Cardiovascular: Normal rate, regular rhythm and normal heart sounds.   No murmur heard. No carotid bruit .  No edema Pulmonary/Chest: Effort normal and breath sounds normal. No respiratory distress. No has no wheezes. No rales.  Msk: no tenderness in right lower back Skin: Skin is warm and dry. Not diaphoretic.  Psychiatric: Normal mood and affect. Behavior is normal.      Assessment & Plan:    See Problem List for Assessment and Plan of chronic medical problems.

## 2017-08-22 ENCOUNTER — Other Ambulatory Visit (INDEPENDENT_AMBULATORY_CARE_PROVIDER_SITE_OTHER): Payer: Medicare Other

## 2017-08-22 DIAGNOSIS — I10 Essential (primary) hypertension: Secondary | ICD-10-CM

## 2017-08-22 DIAGNOSIS — Z125 Encounter for screening for malignant neoplasm of prostate: Secondary | ICD-10-CM

## 2017-08-22 DIAGNOSIS — R7303 Prediabetes: Secondary | ICD-10-CM | POA: Diagnosis not present

## 2017-08-22 DIAGNOSIS — E782 Mixed hyperlipidemia: Secondary | ICD-10-CM

## 2017-08-22 LAB — CBC WITH DIFFERENTIAL/PLATELET
BASOS PCT: 1.1 % (ref 0.0–3.0)
Basophils Absolute: 0.1 10*3/uL (ref 0.0–0.1)
EOS PCT: 2.1 % (ref 0.0–5.0)
Eosinophils Absolute: 0.1 10*3/uL (ref 0.0–0.7)
HCT: 45 % (ref 39.0–52.0)
HEMOGLOBIN: 15.8 g/dL (ref 13.0–17.0)
LYMPHS PCT: 24.3 % (ref 12.0–46.0)
Lymphs Abs: 1.6 10*3/uL (ref 0.7–4.0)
MCHC: 35.1 g/dL (ref 30.0–36.0)
MCV: 95.2 fl (ref 78.0–100.0)
Monocytes Absolute: 0.6 10*3/uL (ref 0.1–1.0)
Monocytes Relative: 9.7 % (ref 3.0–12.0)
Neutro Abs: 4.2 10*3/uL (ref 1.4–7.7)
Neutrophils Relative %: 62.8 % (ref 43.0–77.0)
Platelets: 216 10*3/uL (ref 150.0–400.0)
RBC: 4.73 Mil/uL (ref 4.22–5.81)
RDW: 12.5 % (ref 11.5–15.5)
WBC: 6.7 10*3/uL (ref 4.0–10.5)

## 2017-08-22 LAB — LIPID PANEL
CHOLESTEROL: 128 mg/dL (ref 0–200)
HDL: 39.3 mg/dL (ref >39.00)
LDL Cholesterol: 55 mg/dL (ref 0–99)
NonHDL: 88.96
TRIGLYCERIDES: 172 mg/dL — AB (ref 0.0–149.0)
Total CHOL/HDL Ratio: 3
VLDL: 34.4 mg/dL (ref 0.0–40.0)

## 2017-08-22 LAB — COMPREHENSIVE METABOLIC PANEL
ALK PHOS: 38 U/L — AB (ref 39–117)
ALT: 22 U/L (ref 0–53)
AST: 15 U/L (ref 0–37)
Albumin: 4.3 g/dL (ref 3.5–5.2)
BUN: 16 mg/dL (ref 6–23)
CALCIUM: 9.4 mg/dL (ref 8.4–10.5)
CHLORIDE: 104 meq/L (ref 96–112)
CO2: 27 mEq/L (ref 19–32)
CREATININE: 0.93 mg/dL (ref 0.40–1.50)
GFR: 85.46 mL/min (ref >60.00)
Glucose, Bld: 131 mg/dL — ABNORMAL HIGH (ref 70–99)
POTASSIUM: 4.1 meq/L (ref 3.5–5.1)
Sodium: 139 mEq/L (ref 135–145)
TOTAL PROTEIN: 6.8 g/dL (ref 6.0–8.3)
Total Bilirubin: 0.9 mg/dL (ref 0.2–1.2)

## 2017-08-22 LAB — HEMOGLOBIN A1C: Hgb A1c MFr Bld: 6.4 % (ref 4.6–6.5)

## 2017-08-22 LAB — TSH: TSH: 5.75 u[IU]/mL — AB (ref 0.35–4.50)

## 2017-08-22 LAB — PSA, MEDICARE: PSA: 1.12 ng/mL (ref 0.10–4.00)

## 2017-08-25 ENCOUNTER — Encounter: Payer: Self-pay | Admitting: Internal Medicine

## 2017-08-30 DIAGNOSIS — H52223 Regular astigmatism, bilateral: Secondary | ICD-10-CM | POA: Diagnosis not present

## 2017-09-09 ENCOUNTER — Ambulatory Visit (INDEPENDENT_AMBULATORY_CARE_PROVIDER_SITE_OTHER): Payer: Medicare Other | Admitting: Internal Medicine

## 2017-09-09 ENCOUNTER — Encounter: Payer: Self-pay | Admitting: Internal Medicine

## 2017-09-09 VITALS — BP 152/78 | HR 72 | Temp 98.1°F | Resp 16 | Wt 263.0 lb

## 2017-09-09 DIAGNOSIS — B309 Viral conjunctivitis, unspecified: Secondary | ICD-10-CM | POA: Diagnosis not present

## 2017-09-09 DIAGNOSIS — J301 Allergic rhinitis due to pollen: Secondary | ICD-10-CM | POA: Insufficient documentation

## 2017-09-09 DIAGNOSIS — H9202 Otalgia, left ear: Secondary | ICD-10-CM | POA: Diagnosis not present

## 2017-09-09 NOTE — Patient Instructions (Signed)
Start taking an oral allergy medication (claritin or zyrtec) and flonase.  Take them for 10-14 days.    Use warm compresses for your right eye.    If there is no improvement or any worsening please call in 2 days.

## 2017-09-09 NOTE — Assessment & Plan Note (Signed)
Moderate cerumen, which is not new for him Advised he can do hydrogen peroxide as an outpatient No evidence of infection Possibly related to allergies, eustachian tube dysfunction Allergy medication-oral antihistamine and Flonase nasal spray Call if no improvement or any worsening over the next 48 hours

## 2017-09-09 NOTE — Progress Notes (Signed)
Subjective:    Patient ID: Edwin [name], male    DOB: 09-14-1945, 73 y.o.   MRN: [id number]  HPI The patient is here for an acute visit.  Ear pain:  He started having left ear pain three days ago.  The pain is discomfort. He tends to have a problem with wax in both ears, but typically treats that at home with hydrogen peroxide.  He has seen some decreased hearing in the left ear.  The discomfort in the ear is intermittent.  He is not had many allergies, but this year the pollen has been bothering him more.  States nasal congestion, postnasal drip, sneezing.  He has not been taking anything for his allergies.  He did take Zicam a couple of days.  Watery  Watery, Red eye: For the past couple of days his right eye has been slightly red and watery.  It looked crusted shut this morning.  He denies any pain or change in vision.  He denies any known sick contacts.  Medications and allergies reviewed with patient and updated if appropriate.  Patient Active Problem List   Diagnosis Date Noted  . AC (acromioclavicular) arthritis 08/14/2017  . Tear of MCL (medial collateral ligament) of knee, right, initial encounter 06/05/2017  . Piriformis syndrome of right side 02/19/2017  . Lower back pain 02/15/2017  . Prediabetes 02/14/2017  . Patellofemoral arthritis of right knee 12/30/2014  . Obesity 11/18/2014  . Nonspecific abnormal electrocardiogram (ECG) (EKG) 08/18/2014  . OSA (obstructive sleep apnea) 06/02/2014  . Achilles tendinosis 12/26/2012  . RBBB 05/20/2012  . Hyperlipidemia 03/03/2008  . Essential hypertension 03/03/2008  . History of colonic polyps 03/03/2008  . History of basal cell cancer 05/26/2007  . GILBERT'S SYNDROME 05/26/2007  . DIVERTICULOSIS, COLON 05/26/2007  . BPH (benign prostatic hyperplasia) 05/26/2007    Current Outpatient Medications on File Prior to Visit  Medication Sig Dispense Refill  . aspirin 81 MG tablet Take 81 mg by mouth daily.      [name]  atorvastatin (LIPITOR) 10 MG tablet Take 1 tablet (10 mg total) by mouth every other day. 45 tablet 2  . Cholecalciferol (VITAMIN D3) 1000 UNITS CAPS Take 1 capsule by mouth daily.     . Cyanocobalamin (VITAMIN B-12 PO) Take by mouth as directed.    . metoprolol succinate (TOPROL-XL) 50 MG 24 hr tablet TAKE 1 TABLET ONCE DAILY. TAKE WITH OR IMMEDIATELY FOLLOWING A MEAL. 90 tablet 3  . Multiple Vitamin (MULTIVITAMIN) tablet Take 1 tablet by mouth daily.      . naproxen sodium (ALEVE) 220 MG tablet Take 220 mg by mouth daily as needed.    . nitroGLYCERIN (NITROSTAT) 0.4 MG SL tablet Place 1 tablet (0.4 mg total) under the tongue every 5 (five) minutes as needed for chest pain. 25 tablet 5  . Omega-3 Fatty Acids (OMEGA 3 PO) Take 1 capsule by mouth daily.      No current facility-administered medications on file prior to visit.     Past Medical History:  Diagnosis Date  . BPH (benign prostatic hypertrophy)   . Cancer ([place])    Basal cell of face, [name]  . Diverticulosis   . Gilbert syndrome   . Hyperglycemia   . Hyperlipidemia    NMR Lipoprofile 2005; LDL 146 (2007/1330), HDL 38, TG 169.  LDL goal = <100; Framingham Study LDL goal =<130  . Hypertension   . OSA on CPAP   . Tubular adenoma of colon 01/19/08  Past Surgical History:  Procedure Laterality Date  . COLONOSCOPY  2014   neg; [facility]  . COLONOSCOPY W/ POLYPECTOMY  2004 & 2009   Tics; [facility]  . KNEE ARTHROSCOPY     [name], left knee  . TONSILLECTOMY    . WISDOM TOOTH EXTRACTION      Social History   Socioeconomic History  . Marital status: Married    Spouse name: Not on file  . Number of children: 2  . Years of education: 53  . Highest education level: Not on file  Occupational History  . 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 Financial resource strain: Not hard at all  . Food insecurity:    Worry: Never true    Inability:  Never true  . Transportation needs:    Medical: No    Non-medical: No  Tobacco Use  . Smoking status: Never Smoker  . Smokeless tobacco: Never Used  Substance and Sexual Activity  . Alcohol use: Yes    Alcohol/week: 0.0 oz    Comment: socially, 3-5 beers/week  . Drug use: No  . Sexual activity: Yes  Lifestyle  . Physical activity:    Days per week: 3 days    Minutes per session: 40 min  . Stress: Only a little  Relationships  . Social connections:    Talks on phone: More than three times a week    Gets together: More than three times a week    Attends religious service: More than 4 times per year    Active member of club or organization: Not on file    Attends meetings of clubs or organizations: More than 4 times per year    Relationship status: Married  Other Topics Concern  . Not on file  Social History Narrative   Fun: Travel, read, movies, concerts    Denies abuse and feels safe at home.     Family History  Problem Relation Age of Onset  . Diverticulosis Mother   . Stroke Mother        late 30s  . Parkinsonism Mother   . Diabetes Father   . Hypertension Father   . Heart disease Father 29       CABG  . Cancer Brother        lymphoma  . Appendicitis Brother        ruptured  . Asthma Brother        childhood  . Stroke Paternal Grandmother 64  . Heart attack Maternal Grandmother 80       questionable  . Heart attack Maternal Grandfather 57  . Parkinsonism Maternal Grandfather   . Hypertension Sister   . Appendicitis Sister        ruptured  . Colon cancer Neg Hx   . Esophageal cancer Neg Hx   . Rectal cancer Neg Hx   . Stomach cancer Neg Hx     Review of Systems  Constitutional: Negative for chills and fever.  HENT: Positive for congestion, ear pain (left ear only), hearing loss (left ), postnasal drip and sneezing. Negative for sinus pressure, sinus pain and sore throat.   Eyes: Positive for discharge (right eye last night) and redness.  Respiratory:  Negative for cough, shortness of breath and wheezing.   Neurological: Negative for dizziness, light-headedness and headaches.       Objective:   Vitals:   09/09/17 1446  BP: (!) 152/78  Pulse: 72  Resp: 16  Temp: 98.1 F (36.7 C)  SpO2: 96%   BP Readings from Last 3 Encounters:  09/09/17 (!) 152/78  08/20/17 132/72  08/13/17 130/84   Wt Readings from Last 3 Encounters:  09/09/17 263 lb (119.3 kg)  08/20/17 261 lb (118.4 kg)  08/13/17 263 lb (119.3 kg)   Body mass index is 38.84 kg/m.   Physical Exam  Constitutional: He appears well-developed and well-nourished. No distress.  HENT:  Head: Normocephalic and atraumatic.  Right Ear: External ear normal.  Left Ear: External ear normal.  Nose: Nose normal.  Mouth/Throat: Oropharynx is clear and moist. No oropharyngeal exudate.  Moderate cerumen bilateral ear canals, most of his tympanic membranes visualized bilaterally and were normal  Eyes: EOM are normal. Right eye exhibits no discharge. Left eye exhibits no discharge.  Right conjunctive a mildly erythematous, no active discharge, slight swelling right upper eyelid; left conjunctiva and eyelids normal  Neck: Normal range of motion. No tracheal deviation present. No thyromegaly present.  Cardiovascular: Normal rate and regular rhythm.  Pulmonary/Chest: Effort normal and breath sounds normal. No respiratory distress. He has no wheezes. He has no rales.  Lymphadenopathy:    He has no cervical adenopathy.  Skin: Skin is warm and dry. He is not diaphoretic.           Assessment & Plan:    See Problem List for Assessment and Plan of chronic medical problems.

## 2017-09-09 NOTE — Assessment & Plan Note (Signed)
Some of his symptoms are related to seasonal allergies Advised starting an oral antihistamine and a Flonase nasal spray Call if symptoms do not improve Advised for taking for at least 10-14 days

## 2017-09-09 NOTE — Assessment & Plan Note (Signed)
Conjunctivitis right eye, less likely starting of a stye in the upper eyelid No evidence of a bacterial infection Warm compresses If no improvement or any worsening over the next 48 hours he will call and I will prescribe an antibacterial eyedrop

## 2017-09-12 ENCOUNTER — Other Ambulatory Visit: Payer: Self-pay | Admitting: Internal Medicine

## 2017-09-12 ENCOUNTER — Telehealth: Payer: Self-pay | Admitting: Internal Medicine

## 2017-09-12 MED ORDER — AMOXICILLIN-POT CLAVULANATE 875-125 MG PO TABS: 1.0000 | ORAL_TABLET | Freq: Two times a day (BID) | ORAL | 0 refills | Status: DC

## 2017-09-12 MED ORDER — POLYMYXIN B-TRIMETHOPRIM 10000-0.1 UNIT/ML-% OP SOLN: 1.0000 [drp] | OPHTHALMIC | 0 refills | Status: DC

## 2017-09-12 MED ORDER — FLUTICASONE PROPIONATE 50 MCG/ACT NA SUSP: 2.0000 | Freq: Every day | NASAL | 6 refills | Status: DC

## 2017-09-12 MED ORDER — CIPROFLOXACIN HCL 0.3 % OP SOLN: OPHTHALMIC | 0 refills | Status: DC

## 2017-09-12 NOTE — Telephone Encounter (Signed)
Copied from [place] [phone]. Topic: General - Other >> Sep 12, 2017  2:05 PM Lennox Solders wrote: Reason for CRM: [facility] is calling the polytrim eye drop is on back order and [facility] does not know when med will be available

## 2017-09-12 NOTE — Telephone Encounter (Signed)
Copied from [place] [id number]. Topic: Quick Communication - See Telephone Encounter >> Sep 12, 2017 10:12 AM Ivar Drape wrote: CRM for notification. See Telephone encounter for: 09/12/17. Provider asked patient to report how he was doing on his Mon. 09/09/17 appt.  Patient reports that he is still having problems with his right eye.  It still has mucus in it, and the left ear is still stopped up.  Please advise.

## 2017-09-12 NOTE — Telephone Encounter (Signed)
rx's pending if he is ok with that -  Antibacterial eye drop, oral antibiotic and nasal spray

## 2017-09-12 NOTE — Telephone Encounter (Signed)
LVM on Home phone to advice RXs were being sent to pharmacy.

## 2017-09-12 NOTE — Telephone Encounter (Signed)
New script sent

## 2017-09-17 NOTE — Progress Notes (Signed)
[name] [place], [place] [address] Phone: [phone] Subjective:     CC: Right shoulder pain follow-up  [id number]  Edwin [name] is a 72 y.o. male coming in with complaint of right shoulder. He feels that he is doing better at about 80% improved. Felt a slight difference from last visit after injection. Shoulder extension and external rotation still cause sharp pain.  Patient states that overall not affecting daily activities      Past Medical History:  Diagnosis Date  . BPH (benign prostatic hypertrophy)   . Cancer ([place])    Basal cell of face, [name]  . Diverticulosis   . Gilbert syndrome   . Hyperglycemia   . Hyperlipidemia    NMR Lipoprofile 2005; LDL 146 (2007/1330), HDL 38, TG 169.  LDL goal = <100; Framingham Study LDL goal =<130  . Hypertension   . OSA on CPAP   . Tubular adenoma of colon 01/19/08   Past Surgical History:  Procedure Laterality Date  . COLONOSCOPY  2014   neg; [facility]  . COLONOSCOPY W/ POLYPECTOMY  2004 & 2009   Tics; [facility]  . KNEE ARTHROSCOPY     [name], left knee  . TONSILLECTOMY    . WISDOM TOOTH EXTRACTION     Social History   Socioeconomic History  . Marital status: Married    Spouse name: Not on file  . Number of children: 2  . Years of education: 40  . Highest education level: Not on file  Occupational History  . Occupation: Pharmacist, hospital // Retired    Fish farm manager: [employer]  . Financial resource strain: Not hard at all  . Food insecurity:    Worry: Never true    Inability: Never true  . Transportation needs:    Medical: No    Non-medical: No  Tobacco Use  . Smoking status: Never Smoker  . Smokeless tobacco: Never Used  Substance and Sexual Activity  . Alcohol use: Yes    Alcohol/week: 0.0 oz    Comment: socially, 3-5 beers/week  . Drug use: No  . Sexual activity: Yes  Lifestyle  . Physical activity:    Days  per week: 3 days    Minutes per session: 40 min  . Stress: Only a little  Relationships  . Social connections:    Talks on phone: More than three times a week    Gets together: More than three times a week    Attends religious service: More than 4 times per year    Active member of club or organization: Not on file    Attends meetings of clubs or organizations: More than 4 times per year    Relationship status: Married  Other Topics Concern  . Not on file  Social History Narrative   Fun: Travel, read, movies, concerts    Denies abuse and feels safe at home.    No Known Allergies Family History  Problem Relation Age of Onset  . Diverticulosis Mother   . Stroke Mother        late 86s  . Parkinsonism Mother   . Diabetes Father   . Hypertension Father   . Heart disease Father 31       CABG  . Cancer Brother        lymphoma  . Appendicitis Brother        ruptured  . Asthma Brother  childhood  . Stroke Paternal Grandmother 81  . Heart attack Maternal Grandmother 80       questionable  . Heart attack Maternal Grandfather 57  . Parkinsonism Maternal Grandfather   . Hypertension Sister   . Appendicitis Sister        ruptured  . Colon cancer Neg Hx   . Esophageal cancer Neg Hx   . Rectal cancer Neg Hx   . Stomach cancer Neg Hx      Past medical history, social, surgical and family history all reviewed in electronic medical record.  No pertanent information unless stated regarding to the chief complaint.   Review of Systems:Review of systems updated and as accurate as of 09/18/17  No headache, visual changes, nausea, vomiting, diarrhea, constipation, dizziness, abdominal pain, skin rash, fevers, chills, night sweats, weight loss, swollen lymph nodes, body aches, joint swelling, muscle aches, chest pain, shortness of breath, mood changes.    Objective  Blood pressure 132/76, pulse 63, height 5\' 9"  (1.753 m), weight 263 lb (119.3 kg), SpO2 98 %. Systems examined below  as of 09/18/17   General: No apparent distress alert and oriented x3 mood and affect normal, dressed appropriately.  HEENT: Pupils equal, extraocular movements intact  Respiratory: Patient's speak in full sentences and does not appear short of breath  Cardiovascular: No lower extremity edema, non tender, no erythema  Skin: Warm dry intact with no signs of infection or rash on extremities or on axial skeleton.  Abdomen: Soft nontender  Neuro: Cranial nerves II through XII are intact, neurovascularly intact in all extremities with 2+ DTRs and 2+ pulses.  Lymph: No lymphadenopathy of posterior or anterior cervical chain or axillae bilaterally.  Gait normal with good balance and coordination.  MSK:  Non tender with full range of motion and good stability and symmetric strength and tone of elbows, wrist, hip, knee and ankles bilaterally.  Shoulder: Right Inspection reveals no abnormalities, atrophy or asymmetry. Palpation is normal with no tenderness over AC joint or bicipital groove. ROM is full in all planes. Rotator cuff strength normal throughout. Very mild positive impingement with Neer's and Hawkins Speeds and Yergason's tests normal. No labral pathology noted with negative Obrien's, negative clunk and good stability.  Mild positive crossover still noted. Normal scapular function observed. No painful arc and no drop arm sign. No apprehension sign Contralateral shoulder unremarkable      Impression and Recommendations:     This case required medical decision making of moderate complexity.      Note: This dictation was prepared with Dragon dictation along with smaller phrase technology. Any transcriptional errors that result from this process are unintentional.

## 2017-09-18 ENCOUNTER — Encounter: Payer: Self-pay | Admitting: Family Medicine

## 2017-09-18 ENCOUNTER — Ambulatory Visit (INDEPENDENT_AMBULATORY_CARE_PROVIDER_SITE_OTHER): Payer: Medicare Other | Admitting: Family Medicine

## 2017-09-18 DIAGNOSIS — M19011 Primary osteoarthritis, right shoulder: Secondary | ICD-10-CM

## 2017-09-18 NOTE — Assessment & Plan Note (Signed)
90% improvement.  Patient may have some mild subacromial bursitis.  Discussed the possibility of injection which patient declined.  Encourage patient to do topical anti-inflammatories.  Discussed which activities to doing which wants to avoid.  Keeping hands within peripheral vision.  Follow-up again in 6 weeks if not completely resolved.

## 2017-09-18 NOTE — Patient Instructions (Signed)
Good to see you  I am glad you are doing better Keep hands within peripheral vision  Exercises 1-2 times a week  Still ice before bed maybe another month.  See em again in 6-8 weeks in case not better and can consider injection of the [facility] joint or the shoulder if needed before [place]

## 2017-11-20 ENCOUNTER — Ambulatory Visit (INDEPENDENT_AMBULATORY_CARE_PROVIDER_SITE_OTHER): Payer: Medicare Other | Admitting: Family Medicine

## 2017-11-20 ENCOUNTER — Encounter: Payer: Self-pay | Admitting: Family Medicine

## 2017-11-20 DIAGNOSIS — M19011 Primary osteoarthritis, right shoulder: Secondary | ICD-10-CM

## 2017-11-20 NOTE — Patient Instructions (Signed)
Good to see you  You are doing great  Keep hands within peripheral vision  Ice is your friend.  Iron 65mg  with 500mg  of vitamin C 3 times a week for cramping Gin and tonic drink of choice ;) See me when you need me [phone]

## 2017-11-20 NOTE — Progress Notes (Signed)
[name] [place],  [address] Phone: [phone] Subjective:     CC: Shoulder pain follow-up  [id number]  Edwin [name] is a 72 y.o. male coming in with complaint of left shoulder pain. He does note that his shoulder flares up occasionally. He does feel like he is at 95% improvement.  Overall continues to make progress.  Very minimal discomfort nothing that stopping him from activities    Past Medical History:  Diagnosis Date  . BPH (benign prostatic hypertrophy)   . Cancer ([place])    Basal cell of face, [name]  . Diverticulosis   . Gilbert syndrome   . Hyperglycemia   . Hyperlipidemia    NMR Lipoprofile 2005; LDL 146 (2007/1330), HDL 38, TG 169.  LDL goal = <100; Framingham Study LDL goal =<130  . Hypertension   . OSA on CPAP   . Tubular adenoma of colon 01/19/08   Past Surgical History:  Procedure Laterality Date  . COLONOSCOPY  2014   neg; [facility]  . COLONOSCOPY W/ POLYPECTOMY  2004 & 2009   Tics; [facility]  . KNEE ARTHROSCOPY     [name], left knee  . TONSILLECTOMY    . WISDOM TOOTH EXTRACTION     Social History   Socioeconomic History  . Marital status: Married    Spouse name: Not on file  . Number of children: 2  . Years of education: 41  . Highest education level: Not on file  Occupational History  . 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 Financial resource strain: Not hard at all  . Food insecurity:    Worry: Never true    Inability: Never true  . Transportation needs:    Medical: No    Non-medical: No  Tobacco Use  . Smoking status: Never Smoker  . Smokeless tobacco: Never Used  Substance and Sexual Activity  . Alcohol use: Yes    Alcohol/week: 0.0 oz    Comment: socially, 3-5 beers/week  . Drug use: No  . Sexual activity: Yes  Lifestyle  . Physical activity:    Days per week: 3 days    Minutes per session:  40 min  . Stress: Only a little  Relationships  . Social connections:    Talks on phone: More than three times a week    Gets together: More than three times a week    Attends religious service: More than 4 times per year    Active member of club or organization: Not on file    Attends meetings of clubs or organizations: More than 4 times per year    Relationship status: Married  Other Topics Concern  . Not on file  Social History Narrative   Fun: Travel, read, movies, concerts    Denies abuse and feels safe at home.    No Known Allergies Family History  Problem Relation Age of Onset  . Diverticulosis Mother   . Stroke Mother        late 53s  . Parkinsonism Mother   . Diabetes Father   . Hypertension Father   . Heart disease Father 55       CABG  . Cancer Brother        lymphoma  . Appendicitis Brother        ruptured  . Asthma Brother        childhood  .  Stroke Paternal Grandmother 32  . Heart attack Maternal Grandmother 80       questionable  . Heart attack Maternal Grandfather 57  . Parkinsonism Maternal Grandfather   . Hypertension Sister   . Appendicitis Sister        ruptured  . Colon cancer Neg Hx   . Esophageal cancer Neg Hx   . Rectal cancer Neg Hx   . Stomach cancer Neg Hx      Past medical history, social, surgical and family history all reviewed in electronic medical record.  No pertanent information unless stated regarding to the chief complaint.   Review of Systems:Review of systems updated and as accurate as of 11/20/17  No headache, visual changes, nausea, vomiting, diarrhea, constipation, dizziness, abdominal pain, skin rash, fevers, chills, night sweats, weight loss, swollen lymph nodes, body aches, joint swelling, muscle aches, chest pain, shortness of breath, mood changes.   Objective  There were no vitals taken for this visit. Systems examined below as of 11/20/17   General: No apparent distress alert and oriented x3 mood and affect  normal, dressed appropriately.  HEENT: Pupils equal, extraocular movements intact  Respiratory: Patient's speak in full sentences and does not appear short of breath  Cardiovascular: No lower extremity edema, non tender, no erythema  Skin: Warm dry intact with no signs of infection or rash on extremities or on axial skeleton.  Abdomen: Soft nontender  Neuro: Cranial nerves II through XII are intact, neurovascularly intact in all extremities with 2+ DTRs and 2+ pulses.  Lymph: No lymphadenopathy of posterior or anterior cervical chain or axillae bilaterally.  Gait normal with good balance and coordination.  MSK:  Non tender with full range of motion and good stability and symmetric strength and tone of  elbows, wrist, hip, knee and ankles bilaterally.  Shoulder: Right Inspection reveals no abnormalities, atrophy or asymmetry. Palpation is normal with no tenderness over AC joint or bicipital groove. ROM is full in all planes. Rotator cuff strength normal throughout. Very minimal impingement still remaining mild positive crossover Speeds and Yergason's tests normal. No labral pathology noted with negative Obrien's, negative clunk and good stability. Normal scapular function observed. No painful arc and no drop arm sign. No apprehension sign Contralateral shoulder unremarkable    Impression and Recommendations:     This case required medical decision making of moderate complexity.      Note: This dictation was prepared with Dragon dictation along with smaller phrase technology. Any transcriptional errors that result from this process are unintentional.

## 2017-11-20 NOTE — Assessment & Plan Note (Signed)
Is doing well no greater than 3 months since the injection.  Continue conservative therapy and follow-up as needed

## 2017-12-11 NOTE — Progress Notes (Signed)
Patient ID: Edwin [name], male   DOB: [birth year], 72 y.o.   MRN: [id number]     Cardiology Office Note   Date:  12/12/2017   ID:  Edwin [name], Edwin [name] 1945/08/30, MRN [id number]  PCP:  Edwin Rail, MD  Cardiologist:   Edwin Rouge, MD   No chief complaint on file.     History of Present Illness: Edwin [name] is a 72 y.o. male initially seen for abnormal ECG 09/2014    Had ETT with PA in April Exercised for about 7 mintues Baseline ECG with RBBB with nonspecific ST changes  Should not have had treadmill with baseline abnormal ECG  As these were accentuated with stress No VT. CRF;s HTN on Rx including statin  Sleep apnea   Wears CPAP  .    09/11/14 cardiac CT showed very high calcium score and moderate CAD  Reviewed IMPRESSION: 1) Calcium Score 1010 dense in proximal and mid LAD 91st percentile for age and sex matched controls  2) Moderate 50% proximal and mid LAD calcified disease and 50% ostial D3 disease No obstructive soft plaque in LM  Less than 50% calcified proximal and mid RCA disease  3) Given high calcium score consider f/u perfusion study   F/U perfusion study 12/01/14 reviewed:  EF 58%  Normal with no ischemia or infarction   Had a nice trip to [place] and [place]. Some exertional dyspnea with lots of walking     Past Medical History:  Diagnosis Date  . BPH (benign prostatic hypertrophy)   . Cancer ([place])    Basal cell of face, Dr. Wilhemina [name]  . Diverticulosis   . Gilbert syndrome   . Hyperglycemia   . Hyperlipidemia    NMR Lipoprofile 2005; LDL 146 (2007/1330), HDL 38, TG 169.  LDL goal = <100; Framingham Study LDL goal =<130  . Hypertension   . OSA on CPAP   . Tubular adenoma of colon 01/19/08    Past Surgical History:  Procedure Laterality Date  . COLONOSCOPY  2014   neg; [facility]  . COLONOSCOPY W/ POLYPECTOMY  2004 & 2009   Tics; [facility]  . KNEE ARTHROSCOPY     Dr.Andy [name], left knee  . TONSILLECTOMY    . WISDOM TOOTH EXTRACTION        Current Outpatient Medications  Medication Sig Dispense Refill  . aspirin 81 MG tablet Take 81 mg by mouth daily.      [name] atorvastatin (LIPITOR) 10 MG tablet Take 1 tablet (10 mg total) by mouth every other day. 45 tablet 2  . Cholecalciferol (VITAMIN D3) 1000 UNITS CAPS Take 1 capsule by mouth daily.     . Cyanocobalamin (VITAMIN B-12 PO) Take by mouth as directed.    . metoprolol succinate (TOPROL-XL) 50 MG 24 hr tablet TAKE 1 TABLET ONCE DAILY. TAKE WITH OR IMMEDIATELY FOLLOWING A MEAL. 90 tablet 3  . Multiple Vitamin (MULTIVITAMIN) tablet Take 1 tablet by mouth daily.      . naproxen sodium (ALEVE) 220 MG tablet Take 220 mg by mouth daily as needed.    . nitroGLYCERIN (NITROSTAT) 0.4 MG SL tablet Place 1 tablet (0.4 mg total) under the tongue every 5 (five) minutes as needed for chest pain. 25 tablet 5  . Omega-3 Fatty Acids (OMEGA 3 PO) Take 1 capsule by mouth daily.      No current facility-administered medications for this visit.     Allergies:   Patient has no known allergies.  Social History:  The patient  reports that he has never smoked. He has never used smokeless tobacco. He reports that he drinks alcohol. He reports that he does not use drugs.   Family History:  The patient's family history includes Appendicitis in his brother and sister; Asthma in his brother; Cancer in his brother; Diabetes in his father; Diverticulosis in his mother; Heart attack (age of onset: 56) in his maternal grandfather; Heart attack (age of onset: 8) in his maternal grandmother; Heart disease (age of onset: 81) in his father; Hypertension in his father and sister; Parkinsonism in his maternal grandfather and mother; Stroke in his mother; Stroke (age of onset: 16) in his paternal grandmother.    ROS:  Please see the history of present illness.   Otherwise, review of systems are positive for none.   All other systems are reviewed and negative.      PHYSICAL EXAM: VS:  BP 126/76   Pulse 74    Ht 5\' 9"  (1.753 m)   Wt 268 lb (121.6 kg)   SpO2 96%   BMI 39.58 kg/m  , BMI Body mass index is 39.58 kg/m. Affect appropriate Healthy:  appears stated age 34: normal Neck supple with no adenopathy JVP normal no bruits no thyromegaly Lungs clear with no wheezing and good diaphragmatic motion Heart:  S1/S2 no murmur, no rub, gallop or click PMI normal Abdomen: benighn, BS positve, no tenderness, no AAA no bruit.  No HSM or HJR Distal pulses intact with no bruits No edema Neuro non-focal Skin warm and dry No muscular weakness   EKG:   12/12/17 SR rate 65 RBBB no acute changes    Recent Labs: 08/22/2017: ALT 22; BUN 16; Creatinine, Ser 0.93; Hemoglobin 15.8; Platelets 216.0; Potassium 4.1; Sodium 139; TSH 5.75    Lipid Panel    Component Value Date/Time   CHOL 128 08/22/2017 0735   CHOL 202 (H) 06/02/2014 1116   TRIG 172.0 (H) 08/22/2017 0735   TRIG 213 (H) 06/02/2014 1116   TRIG 179 (H) 05/13/2006 1239   HDL 39.30 08/22/2017 0735   HDL 44 06/02/2014 1116   CHOLHDL 3 08/22/2017 0735   VLDL 34.4 08/22/2017 0735   LDLCALC 55 08/22/2017 0735   LDLCALC 115 (H) 06/02/2014 1116   LDLDIRECT 103.0 06/10/2015 0749      Wt Readings from Last 3 Encounters:  12/12/17 268 lb (121.6 kg)  11/20/17 271 lb (122.9 kg)  09/18/17 263 lb (119.3 kg)      Other studies Reviewed: Additional studies/ records that were reviewed today include: Epic notes  ETT by DR Edwin [name]. myovue and cardiac CT 2016      ASSESSMENT AND PLAN:  1. CAD: Moderate LAD/Circ disease by cardiac CT normal perfusion study  11/30/14 continue risk factor modification and medical Rx  Given high calcium score and disease will order exercise myovue study since its been 3 years  2. Chol:  At goal taking lipitor qod Lab Results  Component Value Date   LDLCALC 55 08/22/2017    3. RBBB:  No change on ECG yearly ECG  4. PAC;s  Benign asymptomatic on beta blocker  5. HTN:  Well controlled.  Continue current  medications and low sodium Dash type diet.   6. Derm:  Post mow's with basal cell removal left nares no recurrence  7. OSA:  Continue CPAP discussed weight loss strategies     Disposition:   FU with me in a year if myovue is normal  Edwin [name]

## 2017-12-12 ENCOUNTER — Ambulatory Visit (INDEPENDENT_AMBULATORY_CARE_PROVIDER_SITE_OTHER): Payer: Medicare Other | Admitting: Cardiovascular Disease

## 2017-12-12 ENCOUNTER — Encounter: Payer: Self-pay | Admitting: Cardiovascular Disease

## 2017-12-12 VITALS — BP 126/76 | HR 74 | Ht 69.0 in | Wt 268.0 lb

## 2017-12-12 DIAGNOSIS — I4510 Unspecified right bundle-branch block: Secondary | ICD-10-CM

## 2017-12-12 DIAGNOSIS — I10 Essential (primary) hypertension: Secondary | ICD-10-CM

## 2017-12-12 DIAGNOSIS — R079 Chest pain, unspecified: Secondary | ICD-10-CM | POA: Diagnosis not present

## 2017-12-12 DIAGNOSIS — E7849 Other hyperlipidemia: Secondary | ICD-10-CM | POA: Diagnosis not present

## 2017-12-12 DIAGNOSIS — R931 Abnormal findings on diagnostic imaging of heart and coronary circulation: Secondary | ICD-10-CM

## 2017-12-12 MED ORDER — NITROGLYCERIN 0.4 MG SL SUBL: 0.4000 mg | SUBLINGUAL_TABLET | SUBLINGUAL | 3 refills | Status: DC | PRN

## 2017-12-12 NOTE — Patient Instructions (Addendum)

## 2017-12-17 ENCOUNTER — Other Ambulatory Visit: Payer: Self-pay | Admitting: Cardiovascular Disease

## 2017-12-17 DIAGNOSIS — R931 Abnormal findings on diagnostic imaging of heart and coronary circulation: Secondary | ICD-10-CM

## 2017-12-17 DIAGNOSIS — E7849 Other hyperlipidemia: Secondary | ICD-10-CM

## 2017-12-19 ENCOUNTER — Telehealth (HOSPITAL_COMMUNITY): Payer: Self-pay

## 2017-12-19 NOTE — Telephone Encounter (Signed)
Detailed message left with the instructions for his Myoview R/S Test. Asked to call back with any questions. S.Tai Skelly EMTP.

## 2017-12-26 ENCOUNTER — Ambulatory Visit (HOSPITAL_COMMUNITY): Payer: Medicare Other | Attending: Internal Medicine

## 2017-12-26 DIAGNOSIS — R079 Chest pain, unspecified: Secondary | ICD-10-CM | POA: Diagnosis not present

## 2017-12-26 DIAGNOSIS — R0600 Dyspnea, unspecified: Secondary | ICD-10-CM | POA: Diagnosis not present

## 2017-12-26 DIAGNOSIS — Z8249 Family history of ischemic heart disease and other diseases of the circulatory system: Secondary | ICD-10-CM | POA: Diagnosis not present

## 2017-12-26 DIAGNOSIS — I10 Essential (primary) hypertension: Secondary | ICD-10-CM | POA: Diagnosis not present

## 2017-12-26 DIAGNOSIS — I4510 Unspecified right bundle-branch block: Secondary | ICD-10-CM | POA: Diagnosis not present

## 2017-12-26 LAB — MYOCARDIAL PERFUSION IMAGING
CHL CUP NUCLEAR SDS: 0
CSEPED: 6 min
CSEPHR: 92 %
CSEPPHR: 139 {beats}/min
Estimated workload: 7 METS
Exercise duration (sec): 31 s
LHR: 0.36
LV sys vol: 42 mL
LVDIAVOL: 107 mL (ref 62–150)
MPHR: 151 {beats}/min
NUC STRESS TID: 1.1
RPE: 17
Rest HR: 64 {beats}/min
SRS: 7
SSS: 7

## 2017-12-26 MED ORDER — TECHNETIUM TC 99M TETROFOSMIN IV KIT
10.4000 | PACK | Freq: Once | INTRAVENOUS | Status: AC | PRN
  Administered 2017-12-26: 10.4 via INTRAVENOUS
  Filled 2017-12-26: qty 11

## 2017-12-26 MED ORDER — TECHNETIUM TC 99M TETROFOSMIN IV KIT
32.8000 | PACK | Freq: Once | INTRAVENOUS | Status: AC | PRN
  Administered 2017-12-26: 32.8 via INTRAVENOUS
  Filled 2017-12-26: qty 33

## 2018-01-28 DIAGNOSIS — D1801 Hemangioma of skin and subcutaneous tissue: Secondary | ICD-10-CM | POA: Diagnosis not present

## 2018-01-28 DIAGNOSIS — L820 Inflamed seborrheic keratosis: Secondary | ICD-10-CM | POA: Diagnosis not present

## 2018-01-28 DIAGNOSIS — D3612 Benign neoplasm of peripheral nerves and autonomic nervous system, upper limb, including shoulder: Secondary | ICD-10-CM | POA: Diagnosis not present

## 2018-01-28 DIAGNOSIS — Z85828 Personal history of other malignant neoplasm of skin: Secondary | ICD-10-CM | POA: Diagnosis not present

## 2018-01-28 DIAGNOSIS — L821 Other seborrheic keratosis: Secondary | ICD-10-CM | POA: Diagnosis not present

## 2018-01-28 DIAGNOSIS — D225 Melanocytic nevi of trunk: Secondary | ICD-10-CM | POA: Diagnosis not present

## 2018-02-20 ENCOUNTER — Ambulatory Visit: Payer: Medicare Other | Admitting: Internal Medicine

## 2018-02-26 NOTE — Progress Notes (Signed)
Subjective:    Patient ID: Edwin [name], male    DOB: 1945-11-02, 72 y.o.   MRN: [id number]  HPI The patient is here for follow up.  Prediabetes:  He is compliant with a low sugar/carbohydrate diet.  He is exercising regularly - treadmill, recumbent bike.  Hypertension: He is taking his medication daily. He is compliant with a low sodium diet.  He denies chest pain, palpitations, edema, shortness of breath and regular headaches. He is exercising regularly.  He does not monitor his blood pressure at home.    Hyperlipidemia: He is taking his medication daily. He is compliant with a low fat/cholesterol diet. He is exercising regularly. He denies myalgias.   Cramping in left calf at night, but not nightly.  It is transient. It occasionally is in the right calf.  He denies back issues.  He feels he is hydrating enough.  Medications and allergies reviewed with patient and updated if appropriate.  Patient Active Problem List   Diagnosis Date Noted  . Acute viral conjunctivitis of right eye 09/09/2017  . Seasonal allergic rhinitis due to pollen 09/09/2017  . Left ear pain 09/09/2017  . AC (acromioclavicular) arthritis 08/14/2017  . Tear of MCL (medial collateral ligament) of knee, right, initial encounter 06/05/2017  . Piriformis syndrome of right side 02/19/2017  . Lower back pain 02/15/2017  . Prediabetes 02/14/2017  . Patellofemoral arthritis of right knee 12/30/2014  . Obesity 11/18/2014  . Nonspecific abnormal electrocardiogram (ECG) (EKG) 08/18/2014  . OSA (obstructive sleep apnea) 06/02/2014  . Achilles tendinosis 12/26/2012  . RBBB 05/20/2012  . Hyperlipidemia 03/03/2008  . Essential hypertension 03/03/2008  . History of colonic polyps 03/03/2008  . History of basal cell cancer 05/26/2007  . GILBERT'S SYNDROME 05/26/2007  . DIVERTICULOSIS, COLON 05/26/2007  . BPH (benign prostatic hyperplasia) 05/26/2007    Current Outpatient Medications on File Prior to Visit    Medication Sig Dispense Refill  . aspirin 81 MG tablet Take 81 mg by mouth daily.      [name] atorvastatin (LIPITOR) 10 MG tablet TAKE ONE TABLET EVERY OTHER DAY. 45 tablet 3  . Cholecalciferol (VITAMIN D3) 1000 UNITS CAPS Take 1 capsule by mouth daily.     . Cyanocobalamin (VITAMIN B-12 PO) Take by mouth as directed.    . metoprolol succinate (TOPROL-XL) 50 MG 24 hr tablet TAKE 1 TABLET ONCE DAILY. TAKE WITH OR IMMEDIATELY FOLLOWING A MEAL. 90 tablet 3  . Multiple Vitamin (MULTIVITAMIN) tablet Take 1 tablet by mouth daily.      . naproxen sodium (ALEVE) 220 MG tablet Take 220 mg by mouth daily as needed.    . nitroGLYCERIN (NITROSTAT) 0.4 MG SL tablet Place 1 tablet (0.4 mg total) under the tongue every 5 (five) minutes as needed for chest pain. 25 tablet 3  . Omega-3 Fatty Acids (OMEGA 3 PO) Take 1 capsule by mouth daily.      No current facility-administered medications on file prior to visit.     Past Medical History:  Diagnosis Date  . BPH (benign prostatic hypertrophy)   . Cancer ([place])    Basal cell of face, [name]  . Diverticulosis   . Gilbert syndrome   . Hyperglycemia   . Hyperlipidemia    NMR Lipoprofile 2005; LDL 146 (2007/1330), HDL 38, TG 169.  LDL goal = <100; Framingham Study LDL goal =<130  . Hypertension   . OSA on CPAP   . Tubular adenoma of colon 01/19/08  Past Surgical History:  Procedure Laterality Date  . COLONOSCOPY  2014   neg; [facility]  . COLONOSCOPY W/ POLYPECTOMY  2004 & 2009   Tics; [facility]  . KNEE ARTHROSCOPY     [name], left knee  . TONSILLECTOMY    . WISDOM TOOTH EXTRACTION      Social History   Socioeconomic History  . Marital status: Married    Spouse name: Not on file  . Number of children: 2  . Years of education: 43  . Highest education level: Not on file  Occupational History  . Occupation: Pharmacist, hospital // Retired    Fish farm manager: [employer]  . Financial resource strain: Not  hard at all  . Food insecurity:    Worry: Never true    Inability: Never true  . Transportation needs:    Medical: No    Non-medical: No  Tobacco Use  . Smoking status: Never Smoker  . Smokeless tobacco: Never Used  Substance and Sexual Activity  . Alcohol use: Yes    Alcohol/week: 0.0 standard drinks    Comment: socially, 3-5 beers/week  . Drug use: No  . Sexual activity: Yes  Lifestyle  . Physical activity:    Days per week: 3 days    Minutes per session: 40 min  . Stress: Only a little  Relationships  . Social connections:    Talks on phone: More than three times a week    Gets together: More than three times a week    Attends religious service: More than 4 times per year    Active member of club or organization: Not on file    Attends meetings of clubs or organizations: More than 4 times per year    Relationship status: Married  Other Topics Concern  . Not on file  Social History Narrative   Fun: Travel, read, movies, concerts    Denies abuse and feels safe at home.     Family History  Problem Relation Age of Onset  . Diverticulosis Mother   . Stroke Mother        late 53s  . Parkinsonism Mother   . Diabetes Father   . Hypertension Father   . Heart disease Father 51       CABG  . Cancer Brother        lymphoma  . Appendicitis Brother        ruptured  . Asthma Brother        childhood  . Stroke Paternal Grandmother 3  . Heart attack Maternal Grandmother 80       questionable  . Heart attack Maternal Grandfather 57  . Parkinsonism Maternal Grandfather   . Hypertension Sister   . Appendicitis Sister        ruptured  . Colon cancer Neg Hx   . Esophageal cancer Neg Hx   . Rectal cancer Neg Hx   . Stomach cancer Neg Hx     Review of Systems  Constitutional: Negative for chills, fatigue and fever.  Respiratory: Negative for cough, shortness of breath and wheezing.   Cardiovascular: Negative for chest pain, palpitations and leg swelling.    Musculoskeletal: Negative for myalgias.       Oc cramping at night  Neurological: Negative for dizziness, light-headedness and headaches.       Objective:   Vitals:   02/27/18 1025  BP: (!) 146/82  Pulse: 75  Resp: 16  Temp: 98 F (36.7  C)  SpO2: 96%   BP Readings from Last 3 Encounters:  02/27/18 (!) 146/82  12/12/17 126/76  11/20/17 118/66   Wt Readings from Last 3 Encounters:  02/27/18 263 lb (119.3 kg)  12/26/17 268 lb (121.6 kg)  12/12/17 268 lb (121.6 kg)   Body mass index is 38.84 kg/m.   Physical Exam    Constitutional: Appears well-developed and well-nourished. No distress.  HENT:  Head: Normocephalic and atraumatic.  Neck: Neck supple. No tracheal deviation present. No thyromegaly present.  No cervical lymphadenopathy Cardiovascular: Normal rate, regular rhythm and normal heart sounds.   No murmur heard. No carotid bruit .  No edema Pulmonary/Chest: Effort normal and breath sounds normal. No respiratory distress. No has no wheezes. No rales.  Skin: Skin is warm and dry. Not diaphoretic.  Psychiatric: Normal mood and affect. Behavior is normal.      Assessment & Plan:    See Problem List for Assessment and Plan of chronic medical problems.

## 2018-02-27 ENCOUNTER — Encounter: Payer: Self-pay | Admitting: Internal Medicine

## 2018-02-27 ENCOUNTER — Other Ambulatory Visit (INDEPENDENT_AMBULATORY_CARE_PROVIDER_SITE_OTHER): Payer: Medicare Other

## 2018-02-27 ENCOUNTER — Ambulatory Visit (INDEPENDENT_AMBULATORY_CARE_PROVIDER_SITE_OTHER): Payer: Medicare Other | Admitting: Internal Medicine

## 2018-02-27 VITALS — BP 146/82 | HR 75 | Temp 98.0°F | Resp 16 | Ht 69.0 in | Wt 263.0 lb

## 2018-02-27 DIAGNOSIS — E782 Mixed hyperlipidemia: Secondary | ICD-10-CM

## 2018-02-27 DIAGNOSIS — I10 Essential (primary) hypertension: Secondary | ICD-10-CM

## 2018-02-27 DIAGNOSIS — E039 Hypothyroidism, unspecified: Secondary | ICD-10-CM | POA: Diagnosis not present

## 2018-02-27 DIAGNOSIS — E038 Other specified hypothyroidism: Secondary | ICD-10-CM | POA: Insufficient documentation

## 2018-02-27 DIAGNOSIS — R252 Cramp and spasm: Secondary | ICD-10-CM

## 2018-02-27 DIAGNOSIS — Z23 Encounter for immunization: Secondary | ICD-10-CM

## 2018-02-27 DIAGNOSIS — R7303 Prediabetes: Secondary | ICD-10-CM | POA: Diagnosis not present

## 2018-02-27 DIAGNOSIS — R7989 Other specified abnormal findings of blood chemistry: Secondary | ICD-10-CM

## 2018-02-27 DIAGNOSIS — R931 Abnormal findings on diagnostic imaging of heart and coronary circulation: Secondary | ICD-10-CM

## 2018-02-27 LAB — HEMOGLOBIN A1C: HEMOGLOBIN A1C: 6.7 % — AB (ref 4.6–6.5)

## 2018-02-27 LAB — TSH: TSH: 3 u[IU]/mL (ref 0.35–4.50)

## 2018-02-27 LAB — COMPREHENSIVE METABOLIC PANEL
ALBUMIN: 4.3 g/dL (ref 3.5–5.2)
ALK PHOS: 36 U/L — AB (ref 39–117)
ALT: 19 U/L (ref 0–53)
AST: 15 U/L (ref 0–37)
BILIRUBIN TOTAL: 0.9 mg/dL (ref 0.2–1.2)
BUN: 19 mg/dL (ref 6–23)
CO2: 25 mEq/L (ref 19–32)
Calcium: 9.5 mg/dL (ref 8.4–10.5)
Chloride: 104 mEq/L (ref 96–112)
Creatinine, Ser: 0.95 mg/dL (ref 0.40–1.50)
GFR: 83.26 mL/min (ref >60.00)
GLUCOSE: 147 mg/dL — AB (ref 70–99)
POTASSIUM: 4 meq/L (ref 3.5–5.1)
Sodium: 139 mEq/L (ref 135–145)
TOTAL PROTEIN: 7.3 g/dL (ref 6.0–8.3)

## 2018-02-27 LAB — LIPID PANEL
CHOL/HDL RATIO: 4
CHOLESTEROL: 129 mg/dL (ref 0–200)
HDL: 36.4 mg/dL — ABNORMAL LOW (ref >39.00)
NonHDL: 92.97
TRIGLYCERIDES: 216 mg/dL — AB (ref 0.0–149.0)
VLDL: 43.2 mg/dL — AB (ref 0.0–40.0)

## 2018-02-27 LAB — MAGNESIUM: Magnesium: 2 mg/dL (ref 1.5–2.5)

## 2018-02-27 LAB — LDL CHOLESTEROL, DIRECT: Direct LDL: 67 mg/dL

## 2018-02-27 MED ORDER — METOPROLOL SUCCINATE ER 50 MG PO TB24: ORAL_TABLET | ORAL | 3 refills | Status: DC

## 2018-02-27 NOTE — Assessment & Plan Note (Signed)
Asymptomatic, last TSH elevated Euthyroid on exam TSH, thyroid antibodies

## 2018-02-27 NOTE — Assessment & Plan Note (Signed)
Blood pressure slightly elevated here today, but his previous measures were very good Continue current medication at current dose CMP, TSH

## 2018-02-27 NOTE — Patient Instructions (Addendum)
  Tests ordered today. Your results will be released to MyChart (or called to you) after review, usually within 72hours after test completion. If any changes need to be made, you will be notified at that same time.  Flu immunization administered today.   Medications reviewed and updated.  Changes include :   none  Your prescription(s) have been submitted to your pharmacy. Please take as directed and contact our office if you believe you are having problem(s) with the medication(s).   Please followup in 6 months   

## 2018-02-27 NOTE — Assessment & Plan Note (Signed)
Check a1c Low sugar / carb diet Stressed regular exercise   

## 2018-02-27 NOTE — Assessment & Plan Note (Signed)
Muscle cramping in calves at night-primarily left leg No back issues Feels like he is hydrating well and he is exercising regularly Check magnesium, CMP His symptoms are not bad enough that he would warrant further evaluation or medication at this time

## 2018-02-27 NOTE — Assessment & Plan Note (Signed)
Check lipid panel  Continue daily statin Regular exercise and healthy diet encouraged  

## 2018-02-28 LAB — THYROID ANTIBODIES
Thyroglobulin Ab: <1 IU/mL (ref <=1)
Thyroperoxidase Ab SerPl-aCnc: <1 IU/mL (ref <9)

## 2018-03-01 ENCOUNTER — Encounter: Payer: Self-pay | Admitting: Internal Medicine

## 2018-03-01 DIAGNOSIS — R7303 Prediabetes: Secondary | ICD-10-CM | POA: Insufficient documentation

## 2018-03-01 DIAGNOSIS — E119 Type 2 diabetes mellitus without complications: Secondary | ICD-10-CM | POA: Insufficient documentation

## 2018-05-07 NOTE — Progress Notes (Signed)
Edwin [name], [place] [address] Phone: [phone] Subjective:   Edwin [name], am serving as a scribe for [name].   CC: Left knee pain  [id number]  Edwin [name] is a 73 y.o. male coming in with complaint of left knee pain on the medial aspect of the joint. Is using Aleve. Pain started one week ago. [name] mechanism of injury. Did travel to [place] over the weekend.  Known arthritic changes of the right knee previously.  Some increasing instability with increased swelling initially     Past Medical History:  Diagnosis Date  . BPH (benign prostatic hypertrophy)   . Cancer ([place])    Basal cell of face, [name]  . Diverticulosis   . Gilbert syndrome   . Hyperglycemia   . Hyperlipidemia    NMR Lipoprofile 2005; LDL 146 (2007/1330), HDL 38, TG 169.  LDL goal = <100; Framingham Study LDL goal =<130  . Hypertension   . OSA on CPAP   . Tubular adenoma of colon 01/19/08   Past Surgical History:  Procedure Laterality Date  . COLONOSCOPY  2014   neg; [facility]  . COLONOSCOPY W/ POLYPECTOMY  2004 & 2009   Tics; [facility]  . KNEE ARTHROSCOPY     [name], left knee  . TONSILLECTOMY    . WISDOM TOOTH EXTRACTION     Social History   Socioeconomic History  . Marital status: Married    Spouse name: Not on file  . Number of children: 2  . Years of education: 60  . Highest education level: Not on file  Occupational History  . 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 Financial resource strain: Not hard at all  . Food insecurity:    Worry: Never true    Inability: Never true  . Transportation needs:    Medical: [name]    Non-medical: [name]  Tobacco Use  . Smoking status: Never Smoker  . Smokeless tobacco: Never Used  Substance and Sexual Activity  . Alcohol use: Yes    Alcohol/week: 0.0 standard drinks    Comment: socially, 3-5 beers/week  .  Drug use: [name]  . Sexual activity: Yes  Lifestyle  . Physical activity:    Days per week: 3 days    Minutes per session: 40 min  . Stress: Only a little  Relationships  . Social connections:    Talks on phone: More than three times a week    Gets together: More than three times a week    Attends religious service: More than 4 times per year    Active member of club or organization: Not on file    Attends meetings of clubs or organizations: More than 4 times per year    Relationship status: Married  Other Topics Concern  . Not on file  Social History Narrative   Fun: Travel, read, movies, concerts    Denies abuse and feels safe at home.    [name] Known Allergies Family History  Problem Relation Age of Onset  . Diverticulosis Mother   . Stroke Mother        late 93s  . Parkinsonism Mother   . Diabetes Father   . Hypertension Father   . Heart disease Father 68       CABG  . Cancer Brother        lymphoma  .  Appendicitis Brother        ruptured  . Asthma Brother        childhood  . Stroke Paternal Grandmother 8  . Heart attack Maternal Grandmother 80       questionable  . Heart attack Maternal Grandfather 57  . Parkinsonism Maternal Grandfather   . Hypertension Sister   . Appendicitis Sister        ruptured  . Colon cancer Neg Hx   . Esophageal cancer Neg Hx   . Rectal cancer Neg Hx   . Stomach cancer Neg Hx      Current Outpatient Medications (Cardiovascular):  .  atorvastatin (LIPITOR) 10 MG tablet, TAKE ONE TABLET EVERY OTHER DAY. .  metoprolol succinate (TOPROL-XL) 50 MG 24 hr tablet, TAKE 1 TABLET ONCE DAILY. TAKE WITH OR IMMEDIATELY FOLLOWING A MEAL. .  nitroGLYCERIN (NITROSTAT) 0.4 MG SL tablet, Place 1 tablet (0.4 mg total) under the tongue every 5 (five) minutes as needed for chest pain.   Current Outpatient Medications (Analgesics):  .  aspirin 81 MG tablet, Take 81 mg by mouth daily.   .  naproxen sodium (ALEVE) 220 MG tablet, Take 220 mg by mouth daily  as needed.  Current Outpatient Medications (Hematological):  [name]  Cyanocobalamin (VITAMIN B-12 PO), Take by mouth as directed.  Current Outpatient Medications (Other):  [name]  Cholecalciferol (VITAMIN D3) 1000 UNITS CAPS, Take 1 capsule by mouth daily.  .  Multiple Vitamin (MULTIVITAMIN) tablet, Take 1 tablet by mouth daily.   .  Omega-3 Fatty Acids (OMEGA 3 PO), Take 1 capsule by mouth daily.     Past medical history, social, surgical and family history all reviewed in electronic medical record.  [name] pertanent information unless stated regarding to the chief complaint.   Review of Systems:  [name] headache, visual changes, nausea, vomiting, diarrhea, constipation, dizziness, abdominal pain, skin rash, fevers, chills, night sweats, weight loss, swollen lymph nodes, body aches, joint swelling,  chest pain, shortness of breath, mood changes.  Positive muscle aches  Objective  Blood pressure 118/78, pulse 70, height 5\' 9"  (1.753 m), weight 240 lb (108.9 kg), SpO2 96 %.    General: [name] apparent distress alert and oriented x3 mood and affect normal, dressed appropriately.  HEENT: Pupils equal, extraocular movements intact  Respiratory: Patient's speak in full sentences and does not appear short of breath  Cardiovascular: 1+ lower extremity edema, non tender, [name] erythema  Skin: Warm dry intact with [name] signs of infection or rash on extremities or on axial skeleton.  Abdomen: Soft nontender  Neuro: Cranial nerves II through XII are intact, neurovascularly intact in all extremities with 2+ DTRs and 2+ pulses.  Lymph: [name] lymphadenopathy of posterior or anterior cervical chain or axillae bilaterally.  Gait antalgic MSK:  Non tender with full range of motion and good stability and symmetric strength and tone of shoulders, elbows, wrist, hip, and ankles bilaterally.  Knee: Left valgus deformity noted. Abnormal thigh to calf ratio.  Tender to palpation over medial and PF joint line.  Lacks last 5 degrees of  extension  instability with valgus force.  painful patellar compression. Patellar glide with moderate crepitus. Patellar and quadriceps tendons unremarkable. Hamstring and quadriceps strength is normal. Contralateral knee shows arthritic changes as well  MSK US performed of: Left knee This study was ordered, performed, and interpreted by Charlann Boxer D.O.  Knee: Severe narrowing of the medial compartment and moderate narrowing of the patellofemoral.  Hypoechoic changes noted  IMPRESSION: Moderate arthritic  changes.  After informed written and verbal consent, patient was seated on exam table. Left knee was prepped with alcohol swab and utilizing anterolateral approach, patient's left knee space was injected with 4:1  marcaine 0.5%: Kenalog 40mg /dL. Patient tolerated the procedure well without immediate complications.   Impression and Recommendations:     This case required medical decision making of moderate complexity. The above documentation has been reviewed and is accurate and complete Lyndal Pulley, DO       Note: This dictation was prepared with Dragon dictation along with smaller phrase technology. Any transcriptional errors that result from this process are unintentional.

## 2018-05-08 ENCOUNTER — Ambulatory Visit: Payer: Self-pay

## 2018-05-08 ENCOUNTER — Ambulatory Visit (INDEPENDENT_AMBULATORY_CARE_PROVIDER_SITE_OTHER): Payer: Medicare Other | Admitting: Family Medicine

## 2018-05-08 ENCOUNTER — Encounter: Payer: Self-pay | Admitting: Family Medicine

## 2018-05-08 VITALS — BP 118/78 | HR 70 | Ht 69.0 in | Wt 240.0 lb

## 2018-05-08 DIAGNOSIS — G8929 Other chronic pain: Secondary | ICD-10-CM | POA: Diagnosis not present

## 2018-05-08 DIAGNOSIS — M25562 Pain in left knee: Principal | ICD-10-CM

## 2018-05-08 DIAGNOSIS — M1712 Unilateral primary osteoarthritis, left knee: Secondary | ICD-10-CM | POA: Insufficient documentation

## 2018-05-08 NOTE — Assessment & Plan Note (Signed)
Patient was given injection.  Tolerated the procedure well.  Due to patient's abnormal thigh to calf ratio and arthritic changes patient will be fitted with a custom stability OA brace.  Topical anti-inflammatories given, patient would be a candidate for Visco supplementation, home exercise given.  Discussed icing regimen.  Follow-up again in 4 to 6 weeks

## 2018-05-08 NOTE — Patient Instructions (Addendum)
Good to see you  Ice 20 minutes 2 times daily. Usually after activity and before bed. pennsaid pinkie amount topically 2 times daily as needed.  They will call you on a brace   Exercises 3 times a week.  Try lower impact exercises  See me again in 4 weeks

## 2018-06-04 NOTE — Progress Notes (Signed)
[name] [place],  [address] Phone: [phone] Subjective:   Edwin [name], am serving as a scribe for [name].   CC: Left knee pain  [id number]  Edwin [name] is a 73 y.o. male coming in with complaint of left knee pain. Does feel some improvement since last visit. Was given a steroid injection. Pain is more dull in nature instead of sharp. Does continue to ice daily. Has not been able to wear brace.       Past Medical History:  Diagnosis Date  . BPH (benign prostatic hypertrophy)   . Cancer ([place])    Basal cell of face, [name]  . Diverticulosis   . Gilbert syndrome   . Hyperglycemia   . Hyperlipidemia    NMR Lipoprofile 2005; LDL 146 (2007/1330), HDL 38, TG 169.  LDL goal = <100; Framingham Study LDL goal =<130  . Hypertension   . OSA on CPAP   . Tubular adenoma of colon 01/19/08   Past Surgical History:  Procedure Laterality Date  . COLONOSCOPY  2014   neg; [facility]  . COLONOSCOPY W/ POLYPECTOMY  2004 & 2009   Tics; [facility]  . KNEE ARTHROSCOPY     [name], left knee  . TONSILLECTOMY    . WISDOM TOOTH EXTRACTION     Social History   Socioeconomic History  . Marital status: Married    Spouse name: Not on file  . Number of children: 2  . Years of education: 47  . Highest education level: Not on file  Occupational History  . 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 Financial resource strain: Not hard at all  . Food insecurity:    Worry: Never true    Inability: Never true  . Transportation needs:    Medical: [name]    Non-medical: [name]  Tobacco Use  . Smoking status: Never Smoker  . Smokeless tobacco: Never Used  Substance and Sexual Activity  . Alcohol use: Yes    Alcohol/week: 0.0 standard drinks    Comment: socially, 3-5 beers/week  . Drug use: [name]  . Sexual activity: Yes  Lifestyle  . Physical activity:    Days per week: 3 days    Minutes per session: 40 min  . Stress: Only a little  Relationships  . Social connections:    Talks on phone: More than three times a week    Gets together: More than three times a week    Attends religious service: More than 4 times per year    Active member of club or organization: Not on file    Attends meetings of clubs or organizations: More than 4 times per year    Relationship status: Married  Other Topics Concern  . Not on file  Social History Narrative   Fun: Travel, read, movies, concerts    Denies abuse and feels safe at home.    [name] Known Allergies Family History  Problem Relation Age of Onset  . Diverticulosis Mother   . Stroke Mother        late 29s  . Parkinsonism Mother   . Diabetes Father   . Hypertension Father   . Heart disease Father 50       CABG  . Cancer Brother        lymphoma  . Appendicitis Brother  ruptured  . Asthma Brother        childhood  . Stroke Paternal Grandmother 43  . Heart attack Maternal Grandmother 80       questionable  . Heart attack Maternal Grandfather 57  . Parkinsonism Maternal Grandfather   . Hypertension Sister   . Appendicitis Sister        ruptured  . Colon cancer Neg Hx   . Esophageal cancer Neg Hx   . Rectal cancer Neg Hx   . Stomach cancer Neg Hx      Current Outpatient Medications (Cardiovascular):  .  atorvastatin (LIPITOR) 10 MG tablet, TAKE ONE TABLET EVERY OTHER DAY. .  metoprolol succinate (TOPROL-XL) 50 MG 24 hr tablet, TAKE 1 TABLET ONCE DAILY. TAKE WITH OR IMMEDIATELY FOLLOWING A MEAL. .  nitroGLYCERIN (NITROSTAT) 0.4 MG SL tablet, Place 1 tablet (0.4 mg total) under the tongue every 5 (five) minutes as needed for chest pain.   Current Outpatient Medications (Analgesics):  .  aspirin 81 MG tablet, Take 81 mg by mouth daily.   .  naproxen sodium (ALEVE) 220 MG tablet, Take 220 mg by mouth daily as needed.  Current Outpatient Medications (Hematological):  [name]   Cyanocobalamin (VITAMIN B-12 PO), Take by mouth as directed.  Current Outpatient Medications (Other):  [name]  Cholecalciferol (VITAMIN D3) 1000 UNITS CAPS, Take 1 capsule by mouth daily.  .  Multiple Vitamin (MULTIVITAMIN) tablet, Take 1 tablet by mouth daily.   .  Omega-3 Fatty Acids (OMEGA 3 PO), Take 1 capsule by mouth daily.     Past medical history, social, surgical and family history all reviewed in electronic medical record.  [name] pertanent information unless stated regarding to the chief complaint.   Review of Systems:  [name] headache, visual changes, nausea, vomiting, diarrhea, constipation, dizziness, abdominal pain, skin rash, fevers, chills, night sweats, weight loss, swollen lymph nodes, body aches chest pain, shortness of breath, mood changes.  Positive muscle aches and joint swelling  Objective  Blood pressure 118/64, pulse 74, height 5\' 9"  (1.753 m), weight 264 lb (119.7 kg), SpO2 95 %.    General: [name] apparent distress alert and oriented x3 mood and affect normal, dressed appropriately.  HEENT: Pupils equal, extraocular movements intact  Respiratory: Patient's speak in full sentences and does not appear short of breath  Cardiovascular: [name] lower extremity edema, non tender, [name] erythema  Skin: Warm dry intact with [name] signs of infection or rash on extremities or on axial skeleton.  Abdomen: Soft nontender  Neuro: Cranial nerves II through XII are intact, neurovascularly intact in all extremities with 2+ DTRs and 2+ pulses.  Lymph: [name] lymphadenopathy of posterior or anterior cervical chain or axillae bilaterally.  Gait antalgic MSK: Mild tender with limited range of motion and stability and symmetric strength and tone of shoulders, elbows, wrist, hip and ankles bilaterally.  Knee: Left valgus deformity noted.  Abnormal thigh to calf ratio.  Tender to palpation over medial and PF joint line.  ROM full in flexion and extension and lower leg rotation. instability with valgus force.    painful patellar compression. Patellar glide with moderate crepitus. Patellar and quadriceps tendons unremarkable. Hamstring and quadriceps strength is normal. Contralateral knee shows continued changes but [name] instability  After informed written and verbal consent, patient was seated on exam table. Left knee was prepped with alcohol swab and utilizing anterolateral approach, patient's left knee space was injected with Monovisc 22 mg/mL of hyaluronic acid. Patient tolerated the procedure well without immediate complications.  Impression and Recommendations:     This case required medical decision making of moderate complexity. The above documentation has been reviewed and is accurate and complete Lyndal Pulley, DO       Note: This dictation was prepared with Dragon dictation along with smaller phrase technology. Any transcriptional errors that result from this process are unintentional.

## 2018-06-05 ENCOUNTER — Ambulatory Visit (INDEPENDENT_AMBULATORY_CARE_PROVIDER_SITE_OTHER): Payer: Medicare Other | Admitting: Family Medicine

## 2018-06-05 ENCOUNTER — Encounter: Payer: Self-pay | Admitting: Family Medicine

## 2018-06-05 DIAGNOSIS — M1712 Unilateral primary osteoarthritis, left knee: Secondary | ICD-10-CM | POA: Diagnosis not present

## 2018-06-05 NOTE — Patient Instructions (Signed)
Good to see you  Monovisc given  Will take about a month  Call ryan or lucus on the [place] see me again in 6 ish weeks

## 2018-06-05 NOTE — Assessment & Plan Note (Signed)
Viscosupplementation given today.  Discussed icing regimen and home exercise.  Which activities to do which was to avoid.  Follow-up again 4 weeks.

## 2018-07-16 NOTE — Progress Notes (Signed)
[name] [name], [name] [address] Phone: [phone] Subjective:   I Edwin [name] am serving as a Education administrator for Dr. Hulan [name].  CC: Left knee pain follow-up  DDU:[id number]   06/05/2018 Viscosupplementation given today.  Discussed icing regimen and home exercise.  Which activities to do which was to avoid.  Follow-up again 4 weeks.  Updated 07/17/2018 Edwin [name] is a 73 y.o. male coming in with complaint of left knee pain. States he feels better today.  States that the viscosupplementation seems to be very helpful.  Patient states that it is about 95% better.       Past Medical History:  Diagnosis Date  . BPH (benign prostatic hypertrophy)   . Cancer ([place])    Basal cell of face, Dr. Wilhemina [name]  . Diverticulosis   . Gilbert syndrome   . Hyperglycemia   . Hyperlipidemia    NMR Lipoprofile 2005; LDL 146 (2007/1330), HDL 38, TG 169.  LDL goal = <100; Framingham Study LDL goal =<130  . Hypertension   . OSA on CPAP   . Tubular adenoma of colon 01/19/08   Past Surgical History:  Procedure Laterality Date  . COLONOSCOPY  2014   neg; [facility]  . COLONOSCOPY W/ POLYPECTOMY  2004 & 2009   Tics; [facility]  . KNEE ARTHROSCOPY     Dr.Andy [name], left knee  . TONSILLECTOMY    . WISDOM TOOTH EXTRACTION     Social History   Socioeconomic History  . Marital status: Married    Spouse name: Not on file  . Number of children: 2  . Years of education: 56  . Highest education level: Not on file  Occupational History  . 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 Financial resource strain: Not hard at all  . Food insecurity:    Worry: Never true    Inability: Never true  . Transportation needs:    Medical: No    Non-medical: No  Tobacco Use  . Smoking status: Never Smoker  . Smokeless tobacco: Never Used  Substance and Sexual Activity  . Alcohol use: Yes   Alcohol/week: 0.0 standard drinks    Comment: socially, 3-5 beers/week  . Drug use: No  . Sexual activity: Yes  Lifestyle  . Physical activity:    Days per week: 3 days    Minutes per session: 40 min  . Stress: Only a little  Relationships  . Social connections:    Talks on phone: More than three times a week    Gets together: More than three times a week    Attends religious service: More than 4 times per year    Active member of club or organization: Not on file    Attends meetings of clubs or organizations: More than 4 times per year    Relationship status: Married  Other Topics Concern  . Not on file  Social History Narrative   Fun: Travel, read, movies, concerts    Denies abuse and feels safe at home.    No Known Allergies Family History  Problem Relation Age of Onset  . Diverticulosis Mother   . Stroke Mother        late 5s  . Parkinsonism Mother   . Diabetes Father   . Hypertension Father   . Heart disease Father 64       CABG  . Cancer Brother  lymphoma  . Appendicitis Brother        ruptured  . Asthma Brother        childhood  . Stroke Paternal Grandmother 12  . Heart attack Maternal Grandmother 80       questionable  . Heart attack Maternal Grandfather 57  . Parkinsonism Maternal Grandfather   . Hypertension Sister   . Appendicitis Sister        ruptured  . Colon cancer Neg Hx   . Esophageal cancer Neg Hx   . Rectal cancer Neg Hx   . Stomach cancer Neg Hx      Current Outpatient Medications (Cardiovascular):  .  atorvastatin (LIPITOR) 10 MG tablet, TAKE ONE TABLET EVERY OTHER DAY. .  metoprolol succinate (TOPROL-XL) 50 MG 24 hr tablet, TAKE 1 TABLET ONCE DAILY. TAKE WITH OR IMMEDIATELY FOLLOWING A MEAL. .  nitroGLYCERIN (NITROSTAT) 0.4 MG SL tablet, Place 1 tablet (0.4 mg total) under the tongue every 5 (five) minutes as needed for chest pain.   Current Outpatient Medications (Analgesics):  .  aspirin 81 MG tablet, Take 81 mg by mouth  daily.   .  naproxen sodium (ALEVE) 220 MG tablet, Take 220 mg by mouth daily as needed.  Current Outpatient Medications (Hematological):  [name]  Cyanocobalamin (VITAMIN B-12 PO), Take by mouth as directed.  Current Outpatient Medications (Other):  [name]  Cholecalciferol (VITAMIN D3) 1000 UNITS CAPS, Take 1 capsule by mouth daily.  .  Multiple Vitamin (MULTIVITAMIN) tablet, Take 1 tablet by mouth daily.   .  Omega-3 Fatty Acids (OMEGA 3 PO), Take 1 capsule by mouth daily.     Past medical history, social, surgical and family history all reviewed in electronic medical record.  No pertanent information unless stated regarding to the chief complaint.   Review of Systems:  No headache, visual changes, nausea, vomiting, diarrhea, constipation, dizziness, abdominal pain, skin rash, fevers, chills, night sweats, weight loss, swollen lymph nodes, body aches, joint swelling,  chest pain, shortness of breath, mood changes.  Positive muscle aches  Objective  Blood pressure (!) 142/84, pulse 69, height 5\' 9"  (1.753 m), weight 265 lb (120.2 kg), SpO2 95 %.   General: No apparent distress alert and oriented x3 mood and affect normal, dressed appropriately.  HEENT: Pupils equal, extraocular movements intact  Respiratory: Patient's speak in full sentences and does not appear short of breath  Cardiovascular: No lower extremity edema, non tender, no erythema  Skin: Warm dry intact with no signs of infection or rash on extremities or on axial skeleton.  Abdomen: Soft nontender  Neuro: Cranial nerves II through XII are intact, neurovascularly intact in all extremities with 2+ DTRs and 2+ pulses.  Lymph: No lymphadenopathy of posterior or anterior cervical chain or axillae bilaterally.  Gait normal with good balance and coordination.  MSK:  Non tender with full range of motion and good stability and symmetric strength and tone of shoulders, elbows, wrist, hip, and ankles bilaterally.  Patient left knee does have  some mild instability noted still.  Mild tenderness over the medial joint line.  Lacks the last 5 degrees of extension.    Impression and Recommendations:     The above documentation has been reviewed and is accurate and complete Edwin Pulley, DO       Note: This dictation was prepared with Dragon dictation along with smaller phrase technology. Any transcriptional errors that result from this process are unintentional.

## 2018-07-17 ENCOUNTER — Encounter: Payer: Self-pay | Admitting: Family Medicine

## 2018-07-17 ENCOUNTER — Ambulatory Visit (INDEPENDENT_AMBULATORY_CARE_PROVIDER_SITE_OTHER): Payer: Medicare Other | Admitting: Family Medicine

## 2018-07-17 DIAGNOSIS — M1712 Unilateral primary osteoarthritis, left knee: Secondary | ICD-10-CM | POA: Diagnosis not present

## 2018-07-17 NOTE — Assessment & Plan Note (Signed)
Patient is doing much better after viscosupplementation, continue conservative therapy.  Follow-up with me again as needed

## 2018-08-28 ENCOUNTER — Ambulatory Visit: Payer: Medicare Other | Admitting: Internal Medicine

## 2018-09-04 ENCOUNTER — Telehealth: Payer: Self-pay | Admitting: *Deleted

## 2018-09-04 NOTE — Telephone Encounter (Signed)
LVM for patient to call back in regards to scheduling AWV with our health coach. Discussed doing a virtual visit and asked patient to call nurse back at [phone] to schedule.

## 2018-10-28 NOTE — Patient Instructions (Addendum)
  Tests ordered today. Your results will be released to MyChart (or called to you) after review, usually within 72hours after test completion. If any changes need to be made, you will be notified at that same time.  Medications reviewed and updated.  Changes include :   none      Please followup in 6 months   

## 2018-10-28 NOTE — Progress Notes (Signed)
Subjective:    Patient ID: Edwin [name], male    DOB: [birth year], 73 y.o.   MRN: [id number]  HPI The patient is here for follow up.  He is exercising regularly -walking, recumbent bike.     He has been doing [employer] and has lost weight.   Hypertension: He is taking his medication daily. He is compliant with a low sodium diet.  He denies chest pain, palpitations, edema, shortness of breath and regular headaches.      Hyperlipidemia: He is taking his medication daily. He is compliant with a low fat/cholesterol diet. He denies myalgias.   Diabetes: He is taking his medication daily as prescribed. He is compliant with a diabetic diet.   He checks his feet daily and denies foot lesions. He is up-to-date with an ophthalmology examination.   He is having a ache in his back.  He denies any true pain, just an ache.  He does not feel it serious enough to have it evaluated further, just wanted to mention it.  Medications and allergies reviewed with patient and updated if appropriate.  Patient Active Problem List   Diagnosis Date Noted  . Degenerative arthritis of left knee 05/08/2018  . Diabetes mellitus without complication ([place]) 83/15/1761  . Elevated TSH 02/27/2018  . Muscle cramping 02/27/2018  . Subclinical hypothyroidism 02/27/2018  . Seasonal allergic rhinitis due to pollen 09/09/2017  . Left ear pain 09/09/2017  . AC (acromioclavicular) arthritis 08/14/2017  . Tear of MCL (medial collateral ligament) of knee, right, initial encounter 06/05/2017  . Piriformis syndrome of right side 02/19/2017  . Lower back pain 02/15/2017  . Patellofemoral arthritis of right knee 12/30/2014  . Obesity 11/18/2014  . Nonspecific abnormal electrocardiogram (ECG) (EKG) 08/18/2014  . OSA (obstructive sleep apnea) 06/02/2014  . Achilles tendinosis 12/26/2012  . RBBB 05/20/2012  . Hyperlipidemia 03/03/2008  . Essential hypertension 03/03/2008  . History of colonic polyps 03/03/2008  . History of  basal cell cancer 05/26/2007  . GILBERT'S SYNDROME 05/26/2007  . DIVERTICULOSIS, COLON 05/26/2007  . BPH (benign prostatic hyperplasia) 05/26/2007    Current Outpatient Medications on File Prior to Visit  Medication Sig Dispense Refill  . aspirin 81 MG tablet Take 81 mg by mouth daily.      [name] atorvastatin (LIPITOR) 10 MG tablet TAKE ONE TABLET EVERY OTHER DAY. 45 tablet 3  . Cholecalciferol (VITAMIN D3) 1000 UNITS CAPS Take 1 capsule by mouth daily.     . Cyanocobalamin (VITAMIN B-12 PO) Take by mouth as directed.    . metoprolol succinate (TOPROL-XL) 50 MG 24 hr tablet TAKE 1 TABLET ONCE DAILY. TAKE WITH OR IMMEDIATELY FOLLOWING A MEAL. 90 tablet 3  . Multiple Vitamin (MULTIVITAMIN) tablet Take 1 tablet by mouth daily.      . naproxen sodium (ALEVE) 220 MG tablet Take 220 mg by mouth daily as needed.    . nitroGLYCERIN (NITROSTAT) 0.4 MG SL tablet Place 1 tablet (0.4 mg total) under the tongue every 5 (five) minutes as needed for chest pain. 25 tablet 3  . Omega-3 Fatty Acids (OMEGA 3 PO) Take 1 capsule by mouth daily.      No current facility-administered medications on file prior to visit.     Past Medical History:  Diagnosis Date  . BPH (benign prostatic hypertrophy)   . Cancer ([place])    Basal cell of face, [name]  . Diverticulosis   . Gilbert syndrome   . Hyperglycemia   . Hyperlipidemia  NMR Lipoprofile 2005; LDL 146 (2007/1330), HDL 38, TG 169.  LDL goal = <100; Framingham Study LDL goal =<130  . Hypertension   . OSA on CPAP   . Tubular adenoma of colon 01/19/08    Past Surgical History:  Procedure Laterality Date  . COLONOSCOPY  2014   neg; [facility]  . COLONOSCOPY W/ POLYPECTOMY  2004 & 2009   Tics; [facility]  . KNEE ARTHROSCOPY     [name], left knee  . TONSILLECTOMY    . WISDOM TOOTH EXTRACTION      Social History   Socioeconomic History  . Marital status: Married    Spouse name: Not on file  . Number of children: 2  . Years of  education: 62  . Highest education level: Not on file  Occupational History  . 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 Financial resource strain: Not hard at all  . Food insecurity    Worry: Never true    Inability: Never true  . Transportation needs    Medical: No    Non-medical: No  Tobacco Use  . Smoking status: Never Smoker  . Smokeless tobacco: Never Used  Substance and Sexual Activity  . Alcohol use: Yes    Alcohol/week: 0.0 standard drinks    Comment: socially, 3-5 beers/week  . Drug use: No  . Sexual activity: Yes  Lifestyle  . Physical activity    Days per week: 3 days    Minutes per session: 40 min  . Stress: Only a little  Relationships  . Social connections    Talks on phone: More than three times a week    Gets together: More than three times a week    Attends religious service: More than 4 times per year    Active member of club or organization: Not on file    Attends meetings of clubs or organizations: More than 4 times per year    Relationship status: Married  Other Topics Concern  . Not on file  Social History Narrative   Fun: Travel, read, movies, concerts    Denies abuse and feels safe at home.     Family History  Problem Relation Age of Onset  . Diverticulosis Mother   . Stroke Mother        late 86s  . Parkinsonism Mother   . Diabetes Father   . Hypertension Father   . Heart disease Father 11       CABG  . Cancer Brother        lymphoma  . Appendicitis Brother        ruptured  . Asthma Brother        childhood  . Stroke Paternal Grandmother 69  . Heart attack Maternal Grandmother 80       questionable  . Heart attack Maternal Grandfather 57  . Parkinsonism Maternal Grandfather   . Hypertension Sister   . Appendicitis Sister        ruptured  . Colon cancer Neg Hx   . Esophageal cancer Neg Hx   . Rectal cancer Neg Hx   . Stomach cancer Neg Hx     Review of Systems   Constitutional: Negative for chills and fever.  Respiratory: Negative for cough, shortness of breath and wheezing.   Cardiovascular: Negative for chest pain, palpitations and leg swelling.  Neurological: Negative for dizziness, light-headedness and headaches.  Objective:   Vitals:   10/29/18 0944  BP: 124/82  Pulse: 68  Resp: 16  Temp: 98.1 F (36.7 C)  SpO2: 96%   BP Readings from Last 3 Encounters:  10/29/18 124/82  07/17/18 (!) 142/84  06/05/18 118/64   Wt Readings from Last 3 Encounters:  10/29/18 282 lb (127.9 kg)  07/17/18 265 lb (120.2 kg)  06/05/18 264 lb (119.7 kg)   Body mass index is 41.64 kg/m.   Physical Exam    Constitutional: Appears well-developed and well-nourished. No distress.  HENT:  Head: Normocephalic and atraumatic.  Neck: Neck supple. No tracheal deviation present. No thyromegaly present.  No cervical lymphadenopathy Cardiovascular: Normal rate, regular rhythm and normal heart sounds.   No murmur heard. No carotid bruit .  No edema Pulmonary/Chest: Effort normal and breath sounds normal. No respiratory distress. No has no wheezes. No rales.  Skin: Skin is warm and dry. Not diaphoretic.  Psychiatric: Normal mood and affect. Behavior is normal.      Assessment & Plan:    See Problem List for Assessment and Plan of chronic medical problems.

## 2018-10-29 ENCOUNTER — Encounter: Payer: Self-pay | Admitting: Internal Medicine

## 2018-10-29 ENCOUNTER — Ambulatory Visit (INDEPENDENT_AMBULATORY_CARE_PROVIDER_SITE_OTHER): Payer: Medicare Other | Admitting: Internal Medicine

## 2018-10-29 ENCOUNTER — Telehealth: Payer: Self-pay | Admitting: Internal Medicine

## 2018-10-29 ENCOUNTER — Other Ambulatory Visit (INDEPENDENT_AMBULATORY_CARE_PROVIDER_SITE_OTHER): Payer: Medicare Other

## 2018-10-29 ENCOUNTER — Other Ambulatory Visit: Payer: Self-pay

## 2018-10-29 VITALS — BP 124/82 | HR 68 | Temp 98.1°F | Resp 16 | Ht 69.0 in | Wt 252.0 lb

## 2018-10-29 DIAGNOSIS — E038 Other specified hypothyroidism: Secondary | ICD-10-CM

## 2018-10-29 DIAGNOSIS — Z125 Encounter for screening for malignant neoplasm of prostate: Secondary | ICD-10-CM | POA: Diagnosis not present

## 2018-10-29 DIAGNOSIS — I10 Essential (primary) hypertension: Secondary | ICD-10-CM

## 2018-10-29 DIAGNOSIS — E782 Mixed hyperlipidemia: Secondary | ICD-10-CM | POA: Diagnosis not present

## 2018-10-29 DIAGNOSIS — E119 Type 2 diabetes mellitus without complications: Secondary | ICD-10-CM

## 2018-10-29 DIAGNOSIS — E039 Hypothyroidism, unspecified: Secondary | ICD-10-CM

## 2018-10-29 LAB — CBC WITH DIFFERENTIAL/PLATELET
Basophils Absolute: 0.1 10*3/uL (ref 0.0–0.1)
Basophils Relative: 0.9 % (ref 0.0–3.0)
Eosinophils Absolute: 0.1 10*3/uL (ref 0.0–0.7)
Eosinophils Relative: 2.4 % (ref 0.0–5.0)
HCT: 44.6 % (ref 39.0–52.0)
Hemoglobin: 15.6 g/dL (ref 13.0–17.0)
Lymphocytes Relative: 22.9 % (ref 12.0–46.0)
Lymphs Abs: 1.4 10*3/uL (ref 0.7–4.0)
MCHC: 35 g/dL (ref 30.0–36.0)
MCV: 97.1 fl (ref 78.0–100.0)
Monocytes Absolute: 0.7 10*3/uL (ref 0.1–1.0)
Monocytes Relative: 11.8 % (ref 3.0–12.0)
Neutro Abs: 3.8 10*3/uL (ref 1.4–7.7)
Neutrophils Relative %: 62 % (ref 43.0–77.0)
Platelets: 192 10*3/uL (ref 150.0–400.0)
RBC: 4.59 Mil/uL (ref 4.22–5.81)
RDW: 12.7 % (ref 11.5–15.5)
WBC: 6.1 10*3/uL (ref 4.0–10.5)

## 2018-10-29 LAB — COMPREHENSIVE METABOLIC PANEL
ALT: 27 U/L (ref 0–53)
AST: 21 U/L (ref 0–37)
Albumin: 4.4 g/dL (ref 3.5–5.2)
Alkaline Phosphatase: 37 U/L — ABNORMAL LOW (ref 39–117)
BUN: 22 mg/dL (ref 6–23)
CO2: 27 mEq/L (ref 19–32)
Calcium: 9.3 mg/dL (ref 8.4–10.5)
Chloride: 103 mEq/L (ref 96–112)
Creatinine, Ser: 0.95 mg/dL (ref 0.40–1.50)
GFR: 78.18 mL/min (ref >60.00)
Glucose, Bld: 122 mg/dL — ABNORMAL HIGH (ref 70–99)
Potassium: 4.2 mEq/L (ref 3.5–5.1)
Sodium: 139 mEq/L (ref 135–145)
Total Bilirubin: 0.8 mg/dL (ref 0.2–1.2)
Total Protein: 6.9 g/dL (ref 6.0–8.3)

## 2018-10-29 LAB — MICROALBUMIN / CREATININE URINE RATIO
Creatinine,U: 155.9 mg/dL
Microalb Creat Ratio: 0.6 mg/g (ref 0.0–30.0)
Microalb, Ur: 1 mg/dL (ref 0.0–1.9)

## 2018-10-29 LAB — TSH: TSH: 3.99 u[IU]/mL (ref 0.35–4.50)

## 2018-10-29 LAB — HEMOGLOBIN A1C: Hgb A1c MFr Bld: 6 % (ref 4.6–6.5)

## 2018-10-29 LAB — PSA, MEDICARE: PSA: 1.45 ng/mL (ref 0.10–4.00)

## 2018-10-29 NOTE — Telephone Encounter (Signed)
Weight has been changed.

## 2018-10-29 NOTE — Assessment & Plan Note (Signed)
TSH 

## 2018-10-29 NOTE — Assessment & Plan Note (Signed)
He is not fasting so we will hold off on checking lipid panel-it was well controlled 6 months ago Continue atorvastatin 10 mg daily

## 2018-10-29 NOTE — Assessment & Plan Note (Signed)
BP well controlled Current regimen effective and well tolerated Continue current medications at current doses cmp  

## 2018-10-29 NOTE — Telephone Encounter (Signed)
Patient was seen today.  Patient states his weight is documented as 282 but his weight should be 252.  Patient is requesting this to be updated.

## 2018-10-29 NOTE — Assessment & Plan Note (Signed)
Doing well watchers, exercising and has lost weight Diet controlled Check A1c, urine microalbumin

## 2018-10-30 DIAGNOSIS — D225 Melanocytic nevi of trunk: Secondary | ICD-10-CM | POA: Diagnosis not present

## 2018-10-30 DIAGNOSIS — L821 Other seborrheic keratosis: Secondary | ICD-10-CM | POA: Diagnosis not present

## 2018-10-30 DIAGNOSIS — D1801 Hemangioma of skin and subcutaneous tissue: Secondary | ICD-10-CM | POA: Diagnosis not present

## 2018-10-30 DIAGNOSIS — L570 Actinic keratosis: Secondary | ICD-10-CM | POA: Diagnosis not present

## 2018-10-30 DIAGNOSIS — Z85828 Personal history of other malignant neoplasm of skin: Secondary | ICD-10-CM | POA: Diagnosis not present

## 2018-11-11 ENCOUNTER — Other Ambulatory Visit: Payer: Self-pay | Admitting: Cardiovascular Disease

## 2018-11-11 DIAGNOSIS — E7849 Other hyperlipidemia: Secondary | ICD-10-CM

## 2018-11-11 DIAGNOSIS — R931 Abnormal findings on diagnostic imaging of heart and coronary circulation: Secondary | ICD-10-CM

## 2018-12-12 DIAGNOSIS — H2513 Age-related nuclear cataract, bilateral: Secondary | ICD-10-CM | POA: Diagnosis not present

## 2019-01-01 ENCOUNTER — Encounter: Payer: Self-pay | Admitting: Family Medicine

## 2019-01-01 ENCOUNTER — Other Ambulatory Visit: Payer: Self-pay | Admitting: Cardiovascular Disease

## 2019-01-01 ENCOUNTER — Other Ambulatory Visit: Payer: Self-pay

## 2019-01-01 ENCOUNTER — Ambulatory Visit (INDEPENDENT_AMBULATORY_CARE_PROVIDER_SITE_OTHER): Payer: Medicare Other | Admitting: Family Medicine

## 2019-01-01 ENCOUNTER — Ambulatory Visit: Payer: Self-pay

## 2019-01-01 VITALS — BP 112/70 | HR 65 | Ht 69.0 in | Wt 244.0 lb

## 2019-01-01 DIAGNOSIS — R931 Abnormal findings on diagnostic imaging of heart and coronary circulation: Secondary | ICD-10-CM | POA: Diagnosis not present

## 2019-01-01 DIAGNOSIS — M25552 Pain in left hip: Secondary | ICD-10-CM

## 2019-01-01 DIAGNOSIS — M7060 Trochanteric bursitis, unspecified hip: Secondary | ICD-10-CM

## 2019-01-01 DIAGNOSIS — E7849 Other hyperlipidemia: Secondary | ICD-10-CM

## 2019-01-01 DIAGNOSIS — Z23 Encounter for immunization: Secondary | ICD-10-CM

## 2019-01-01 NOTE — Progress Notes (Signed)
[name] [place], [place] [address] Phone: [phone] Subjective:   [name], am serving as a scribe for [name].   CC: Leg problem bilateral hip pain  [id number]   07/17/2018 Patient is doing much better after viscosupplementation, continue conservative therapy.  Follow-up with me again as needed  Update 01/01/2019: Edwin [name] is a 73 y.o. male coming in with complaint of left hip pain. Has been having pain for a few months but worse in the past couple of weeks. Pain over greater trochanter and into the glute. Can intermittently have radiating numbness. Movement improves his pain. Does still feel pain with walking though. Is using ice and Aleve. Pain is 5/10 today.       Past Medical History:  Diagnosis Date  . BPH (benign prostatic hypertrophy)   . Cancer ([place])    Basal cell of face, [name]  . Diverticulosis   . Gilbert syndrome   . Hyperglycemia   . Hyperlipidemia    NMR Lipoprofile 2005; LDL 146 (2007/1330), HDL 38, TG 169.  LDL goal = <100; Framingham Study LDL goal =<130  . Hypertension   . OSA on CPAP   . Tubular adenoma of colon 01/19/08   Past Surgical History:  Procedure Laterality Date  . COLONOSCOPY  2014   neg; [facility]  . COLONOSCOPY W/ POLYPECTOMY  2004 & 2009   Tics; [facility]  . KNEE ARTHROSCOPY     [name], left knee  . TONSILLECTOMY    . WISDOM TOOTH EXTRACTION     Social History   Socioeconomic History  . Marital status: Married    Spouse name: Not on file  . Number of children: 2  . Years of education: 39  . Highest education level: Not on file  Occupational History  . 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 Financial resource strain: Not hard at all  . Food insecurity    Worry: Never true    Inability: Never true  . Transportation needs    Medical: No    Non-medical: No  Tobacco Use  .  Smoking status: Never Smoker  . Smokeless tobacco: Never Used  Substance and Sexual Activity  . Alcohol use: Yes    Alcohol/week: 0.0 standard drinks    Comment: socially, 3-5 beers/week  . Drug use: No  . Sexual activity: Yes  Lifestyle  . Physical activity    Days per week: 3 days    Minutes per session: 40 min  . Stress: Only a little  Relationships  . Social connections    Talks on phone: More than three times a week    Gets together: More than three times a week    Attends religious service: More than 4 times per year    Active member of club or organization: Not on file    Attends meetings of clubs or organizations: More than 4 times per year    Relationship status: Married  Other Topics Concern  . Not on file  Social History Narrative   Fun: Travel, read, movies, concerts    Denies abuse and feels safe at home.    No Known Allergies Family History  Problem Relation Age of Onset  . Diverticulosis Mother   . Stroke Mother        late 43s  . Parkinsonism Mother   . Diabetes Father   .  Hypertension Father   . Heart disease Father 49       CABG  . Cancer Brother        lymphoma  . Appendicitis Brother        ruptured  . Asthma Brother        childhood  . Stroke Paternal Grandmother 3  . Heart attack Maternal Grandmother 80       questionable  . Heart attack Maternal Grandfather 57  . Parkinsonism Maternal Grandfather   . Hypertension Sister   . Appendicitis Sister        ruptured  . Colon cancer Neg Hx   . Esophageal cancer Neg Hx   . Rectal cancer Neg Hx   . Stomach cancer Neg Hx      Current Outpatient Medications (Cardiovascular):  .  atorvastatin (LIPITOR) 10 MG tablet, TAKE ONE TABLET EVERY OTHER DAY. .  metoprolol succinate (TOPROL-XL) 50 MG 24 hr tablet, TAKE 1 TABLET ONCE DAILY. TAKE WITH OR IMMEDIATELY FOLLOWING A MEAL. .  nitroGLYCERIN (NITROSTAT) 0.4 MG SL tablet, Place 1 tablet (0.4 mg total) under the tongue every 5 (five) minutes as  needed for chest pain.   Current Outpatient Medications (Analgesics):  .  aspirin 81 MG tablet, Take 81 mg by mouth daily.   .  naproxen sodium (ALEVE) 220 MG tablet, Take 220 mg by mouth daily as needed.  Current Outpatient Medications (Hematological):  [name]  Cyanocobalamin (VITAMIN B-12 PO), Take by mouth as directed.  Current Outpatient Medications (Other):  [name]  Cholecalciferol (VITAMIN D3) 1000 UNITS CAPS, Take 1 capsule by mouth daily.  .  Multiple Vitamin (MULTIVITAMIN) tablet, Take 1 tablet by mouth daily.   .  Omega-3 Fatty Acids (OMEGA 3 PO), Take 1 capsule by mouth daily.     Past medical history, social, surgical and family history all reviewed in electronic medical record.  No pertanent information unless stated regarding to the chief complaint.   Review of Systems:  No headache, visual changes, nausea, vomiting, diarrhea, constipation, dizziness, abdominal pain, skin rash, fevers, chills, night sweats, weight loss, swollen lymph nodes, body aches, joint swelling, muscle aches, chest pain, shortness of breath, mood changes.   Objective  Blood pressure 112/70, pulse 65, height 5\' 9"  (1.753 m), weight 244 lb (110.7 kg), SpO2 97 %.   General: No apparent distress alert and oriented x3 mood and affect normal, dressed appropriately.  HEENT: Pupils equal, extraocular movements intact  Respiratory: Patient's speak in full sentences and does not appear short of breath  Cardiovascular: No lower extremity edema, non tender, no erythema  Skin: Warm dry intact with no signs of infection or rash on extremities or on axial skeleton.  Abdomen: Soft nontender  Neuro: Cranial nerves II through XII are intact, neurovascularly intact in all extremities with 2+ DTRs and 2+ pulses.  Lymph: No lymphadenopathy of posterior or anterior cervical chain or axillae bilaterally.  Gait normal with good balance and coordination.  MSK:  Non tender with full range of motion and good stability and symmetric  strength and tone of shoulders, elbows, wrist  and ankles bilaterally.  Back exam does have some mild loss of lordosis.  Minimal tenderness in the sacroiliac joints bilaterally and mild over the left gluteal area.  Severe tenderness to palpation over the greater trochanteric area bilaterally. Continued arthritic changes in patient's left knee   Procedure: Real-time Ultrasound Guided Injection of right greater trochanteric bursitis secondary to patient's body habitus Device: GE Logiq Q7 Ultrasound guided injection  is preferred based studies that show increased duration, increased effect, greater accuracy, decreased procedural pain, increased response rate, and decreased cost with ultrasound guided versus blind injection.  Verbal informed consent obtained.  Time-out conducted.  Noted no overlying erythema, induration, or other signs of local infection.  Skin prepped in a sterile fashion.  Local anesthesia: Topical Ethyl chloride.  With sterile technique and under real time ultrasound guidance:  Greater trochanteric area was visualized and patient's bursa was noted. A 22-gauge 3 inch needle was inserted and 4 cc of 0.5% Marcaine and 1 cc of Kenalog 40 mg/dL was injected. Pictures taken Completed without difficulty  Pain immediately resolved suggesting accurate placement of the medication.  Advised to call if fevers/chills, erythema, induration, drainage, or persistent bleeding.  Images permanently stored and available for review in the ultrasound unit.  Impression: Technically successful ultrasound guided injection.   Procedure: Real-time Ultrasound Guided Injection of left  greater trochanteric bursitis secondary to patient's body habitus Device: GE Logiq Q7  Ultrasound guided injection is preferred based studies that show increased duration, increased effect, greater accuracy, decreased procedural pain, increased response rate, and decreased cost with ultrasound guided versus blind injection.   Verbal informed consent obtained.  Time-out conducted.  Noted no overlying erythema, induration, or other signs of local infection.  Skin prepped in a sterile fashion.  Local anesthesia: Topical Ethyl chloride.  With sterile technique and under real time ultrasound guidance:  Greater trochanteric area was visualized and patient's bursa was noted. A 22-gauge 3 inch needle was inserted and 4 cc of 0.5% Marcaine and 1 cc of Kenalog 40 mg/dL was injected. Pictures taken Completed without difficulty  Pain immediately resolved suggesting accurate placement of the medication.  Advised to call if fevers/chills, erythema, induration, drainage, or persistent bleeding.  Images permanently stored and available for review in the ultrasound unit.  Impression: Technically successful ultrasound guided injection.     Impression and Recommendations:     This case required medical decision making of moderate complexity. The above documentation has been reviewed and is accurate and complete Lyndal Pulley, DO       Note: This dictation was prepared with Dragon dictation along with smaller phrase technology. Any transcriptional errors that result from this process are unintentional.

## 2019-01-01 NOTE — Patient Instructions (Signed)
Good to see you.  Ice 20 minutes 2 times daily. Usually after activity and before bed Turmeric 500mg  daily  Tart cherry extract 1200mg  at night Vitamin D 2000 IU daily  See me again in 4-6 weeks

## 2019-01-01 NOTE — Assessment & Plan Note (Signed)
Bilateral injections given today.  Discussed icing regimen and home exercise, which activities that he would avoid, discussed topical anti-inflammatories.  Follow-up again in 4 to 8 weeks.  Worsening symptoms consider further work-up of patient's back.  Patient will follow-up again in 6 to 8 weeks.

## 2019-01-20 NOTE — Progress Notes (Signed)
Patient ID: Edwin [name], male   DOB: 1945/12/12, 73 y.o.   MRN: [id number]     Cardiology Office Note   Date:  02/02/2019   ID:  Edwin [name], Edwin [name] 1945-10-03, MRN [id number]  PCP:  [name], MD  Cardiologist:   Jenkins Rouge, MD   No chief complaint on file.     History of Present Illness: Edwin [name] is a 73 y.o. male initially seen for abnormal ECG 09/2014    Had ETT with PA in April Exercised for about 7 mintues Baseline ECG with RBBB with nonspecific ST changes  Should not have had treadmill with baseline abnormal ECG  As these were accentuated with stress No VT. CRF;s HTN on Rx including statin  Sleep apnea   Wears CPAP  .    09/11/14 cardiac CT showed very high calcium score and moderate CAD  Reviewed IMPRESSION: 1) Calcium Score 1010 dense in proximal and mid LAD 91st percentile for age and sex matched controls  2) Moderate 50% proximal and mid LAD calcified disease and 50% ostial D3 disease No obstructive soft plaque in LM  Less than 50% calcified proximal and mid RCA disease  3) Given high calcium score consider f/u perfusion study   F/U perfusion study 12/01/14 reviewed:  EF 58%  Normal with no ischemia or infarction  Also had normal myovue done 12/26/17   Had a nice trip to [place] and [place]last year 2019  Seen by Charlann Boxer recently for trochanteric bursitis with injection  No cardiac complaints    Past Medical History:  Diagnosis Date  . BPH (benign prostatic hypertrophy)   . Cancer ([place])    Basal cell of face, [name]  . Diverticulosis   . Gilbert syndrome   . Hyperglycemia   . Hyperlipidemia    NMR Lipoprofile 2005; LDL 146 (2007/1330), HDL 38, TG 169.  LDL goal = <100; Framingham Study LDL goal =<130  . Hypertension   . OSA on CPAP   . Tubular adenoma of colon 01/19/08    Past Surgical History:  Procedure Laterality Date  . COLONOSCOPY  2014   neg; [facility]  . COLONOSCOPY W/ POLYPECTOMY  2004 & 2009   Tics; [facility]   . KNEE ARTHROSCOPY     [name], left knee  . TONSILLECTOMY    . WISDOM TOOTH EXTRACTION       Current Outpatient Medications  Medication Sig Dispense Refill  . aspirin 81 MG tablet Take 81 mg by mouth daily.      [name] atorvastatin (LIPITOR) 10 MG tablet Take 1 tablet (10 mg total) by mouth every other day. Pt needs to keep upcoming appt in sept to continue getting refills 30 tablet 0  . Cholecalciferol (VITAMIN D3) 1000 UNITS CAPS Take 1 capsule by mouth daily.     . Cyanocobalamin (VITAMIN B-12 PO) Take by mouth as directed.    . metoprolol succinate (TOPROL-XL) 50 MG 24 hr tablet TAKE 1 TABLET ONCE DAILY. TAKE WITH OR IMMEDIATELY FOLLOWING A MEAL. 90 tablet 3  . Multiple Vitamin (MULTIVITAMIN) tablet Take 1 tablet by mouth daily.      . naproxen sodium (ALEVE) 220 MG tablet Take 220 mg by mouth daily as needed.    . nitroGLYCERIN (NITROSTAT) 0.4 MG SL tablet Place 1 tablet (0.4 mg total) under the tongue every 5 (five) minutes as needed for chest pain. 25 tablet 3   No current facility-administered medications for this visit.  Allergies:   Patient has no known allergies.    Social History:  The patient  reports that he has never smoked. He has never used smokeless tobacco. He reports current alcohol use. He reports that he does not use drugs.   Family History:  The patient's family history includes Appendicitis in his brother and sister; Asthma in his brother; Cancer in his brother; Diabetes in his father; Diverticulosis in his mother; Heart attack (age of onset: 57) in his maternal grandfather; Heart attack (age of onset: 66) in his maternal grandmother; Heart disease (age of onset: 61) in his father; Hypertension in his father and sister; Parkinsonism in his maternal grandfather and mother; Stroke in his mother; Stroke (age of onset: 70) in his paternal grandmother.    ROS:  Please see the history of present illness.   Otherwise, review of systems are positive for none.    All other systems are reviewed and negative.      PHYSICAL EXAM: VS:  BP (!) 154/78   Pulse 80   Ht 5\' 9"  (1.753 m)   Wt 237 lb (107.5 kg)   SpO2 95%   BMI 35.00 kg/m  , BMI Body mass index is 35 kg/m. Affect appropriate Healthy:  appears stated age 24: normal Neck supple with no adenopathy JVP normal no bruits no thyromegaly Lungs clear with no wheezing and good diaphragmatic motion Heart:  S1/S2 no murmur, no rub, gallop or click PMI normal Abdomen: benighn, BS positve, no tenderness, no AAA no bruit.  No HSM or HJR Distal pulses intact with no bruits No edema Neuro non-focal Skin warm and dry No muscular weakness   EKG:   12/12/17 SR rate 65 RBBB no acute changes    Recent Labs: 02/27/2018: Magnesium 2.0 10/29/2018: ALT 27; BUN 22; Creatinine, Ser 0.95; Hemoglobin 15.6; Platelets 192.0; Potassium 4.2; Sodium 139; TSH 3.99    Lipid Panel    Component Value Date/Time   CHOL 129 02/27/2018 1110   CHOL 202 (H) 06/02/2014 1116   TRIG 216.0 (H) 02/27/2018 1110   TRIG 213 (H) 06/02/2014 1116   TRIG 179 (H) 05/13/2006 1239   HDL 36.40 (L) 02/27/2018 1110   HDL 44 06/02/2014 1116   CHOLHDL 4 02/27/2018 1110   VLDL 43.2 (H) 02/27/2018 1110   LDLCALC 55 08/22/2017 0735   LDLCALC 115 (H) 06/02/2014 1116   LDLDIRECT 67.0 02/27/2018 1110      Wt Readings from Last 3 Encounters:  02/02/19 237 lb (107.5 kg)  01/01/19 244 lb (110.7 kg)  10/29/18 252 lb (114.3 kg)      Other studies Reviewed: Additional studies/ records that were reviewed today include: Epic notes  ETT by DR Linna Darner. myovue and cardiac CT 2016 Myovue 12/26/17      ASSESSMENT AND PLAN:  1. CAD: Moderate LAD/Circ disease by cardiac CT Normal perfusion study 12/26/17 continue medical Rx   2. Chol:  At goal taking lipitor qod direct LDL 67 02/27/18   3. RBBB:  No change on ECG yearly ECG   4. PAC;s  Benign asymptomatic on beta blocker   5. HTN:  Well controlled.  Continue current medications  and low sodium Dash type diet.    6. Derm:  Post mow's with basal cell removal left nares no recurrence   7. OSA:  Continue CPAP discussed weight loss strategies   8. DM:  Discussed low carb diet.  Target hemoglobin A1c is 6.5 or less.  Continue current medications.  Most recent A1c 6.0 10/29/18    Disposition:   FU with me in a year if myovue is normal    [employer]

## 2019-02-02 ENCOUNTER — Ambulatory Visit (INDEPENDENT_AMBULATORY_CARE_PROVIDER_SITE_OTHER): Payer: Medicare Other | Admitting: Cardiovascular Disease

## 2019-02-02 ENCOUNTER — Other Ambulatory Visit: Payer: Self-pay

## 2019-02-02 ENCOUNTER — Encounter: Payer: Self-pay | Admitting: Cardiovascular Disease

## 2019-02-02 VITALS — BP 154/78 | HR 80 | Ht 69.0 in | Wt 237.0 lb

## 2019-02-02 DIAGNOSIS — R931 Abnormal findings on diagnostic imaging of heart and coronary circulation: Secondary | ICD-10-CM | POA: Diagnosis not present

## 2019-02-02 DIAGNOSIS — I2510 Atherosclerotic heart disease of native coronary artery without angina pectoris: Secondary | ICD-10-CM

## 2019-02-02 MED ORDER — METOPROLOL SUCCINATE ER 50 MG PO TB24: ORAL_TABLET | ORAL | 3 refills | Status: DC

## 2019-02-02 NOTE — Patient Instructions (Addendum)

## 2019-02-03 ENCOUNTER — Encounter: Payer: Self-pay | Admitting: Internal Medicine

## 2019-02-03 DIAGNOSIS — I2510 Atherosclerotic heart disease of native coronary artery without angina pectoris: Secondary | ICD-10-CM | POA: Insufficient documentation

## 2019-02-05 ENCOUNTER — Ambulatory Visit: Payer: Self-pay

## 2019-02-05 ENCOUNTER — Encounter: Payer: Self-pay | Admitting: Family Medicine

## 2019-02-05 ENCOUNTER — Ambulatory Visit (INDEPENDENT_AMBULATORY_CARE_PROVIDER_SITE_OTHER): Payer: Medicare Other | Admitting: Family Medicine

## 2019-02-05 ENCOUNTER — Other Ambulatory Visit: Payer: Self-pay

## 2019-02-05 VITALS — BP 144/78 | HR 75 | Ht 69.0 in | Wt 236.0 lb

## 2019-02-05 DIAGNOSIS — M545 Low back pain, unspecified: Secondary | ICD-10-CM

## 2019-02-05 DIAGNOSIS — M25552 Pain in left hip: Secondary | ICD-10-CM

## 2019-02-05 DIAGNOSIS — G5702 Lesion of sciatic nerve, left lower limb: Secondary | ICD-10-CM

## 2019-02-05 DIAGNOSIS — R931 Abnormal findings on diagnostic imaging of heart and coronary circulation: Secondary | ICD-10-CM

## 2019-02-05 MED ORDER — MELOXICAM 15 MG PO TABS: 15.0000 mg | ORAL_TABLET | Freq: Every day | ORAL | 0 refills | Status: DC

## 2019-02-05 NOTE — Progress Notes (Signed)
[name] [address] [place], [place] [address] Phone: [phone] Subjective:   I Edwin [name] am serving as a Education administrator for [name].  I'm seeing this patient by the request  of:    CC: Left hip pain  [id number]   01/01/2019 Bilateral injections given today.  Discussed icing regimen and home exercise, which activities that he would avoid, discussed topical anti-inflammatories.  Follow-up again in 4 to 8 weeks.  Worsening symptoms consider further work-up of patient's back.  Patient will follow-up again in 6 to 8 weeks.  02/05/2019 Edwin [name] is a 73 y.o. male coming in with complaint of left hip pain. Patient state she only has pain with sitting. Walking is not painful. Walks about 10,000 steps a day.  Patient states that it is been a dull, throbbing aching sensation.  Patient states that sitting seems to be the worsening aspect and anything else at the moment.     Past Medical History:  Diagnosis Date  . BPH (benign prostatic hypertrophy)   . Cancer ([place])    Basal cell of face, [name]  . Diverticulosis   . Gilbert syndrome   . Hyperglycemia   . Hyperlipidemia    NMR Lipoprofile 2005; LDL 146 (2007/1330), HDL 38, TG 169.  LDL goal = <100; Framingham Study LDL goal =<130  . Hypertension   . OSA on CPAP   . Tubular adenoma of colon 01/19/08   Past Surgical History:  Procedure Laterality Date  . COLONOSCOPY  2014   neg; [facility]  . COLONOSCOPY W/ POLYPECTOMY  2004 & 2009   Tics; [facility]  . KNEE ARTHROSCOPY     [name], left knee  . TONSILLECTOMY    . WISDOM TOOTH EXTRACTION     Social History   Socioeconomic History  . Marital status: Married    Spouse name: Not on file  . Number of children: 2  . Years of education: 50  . Highest education level: Not on file  Occupational History  . Occupation: Pharmacist, hospital // Retired    Fish farm manager: [employer]  . Financial  resource strain: Not hard at all  . Food insecurity    Worry: Never true    Inability: Never true  . Transportation needs    Medical: No    Non-medical: No  Tobacco Use  . Smoking status: Never Smoker  . Smokeless tobacco: Never Used  Substance and Sexual Activity  . Alcohol use: Yes    Alcohol/week: 0.0 standard drinks    Comment: socially, 3-5 beers/week  . Drug use: No  . Sexual activity: Yes  Lifestyle  . Physical activity    Days per week: 3 days    Minutes per session: 40 min  . Stress: Only a little  Relationships  . Social connections    Talks on phone: More than three times a week    Gets together: More than three times a week    Attends religious service: More than 4 times per year    Active member of club or organization: Not on file    Attends meetings of clubs or organizations: More than 4 times per year    Relationship status: Married  Other Topics Concern  . Not on file  Social History Narrative   Fun: Travel, read, movies, concerts    Denies abuse and feels safe at home.    No Known Allergies Family History  Problem Relation Age of Onset  . Diverticulosis Mother   . Stroke Mother        late 62s  . Parkinsonism Mother   . Diabetes Father   . Hypertension Father   . Heart disease Father 78       CABG  . Cancer Brother        lymphoma  . Appendicitis Brother        ruptured  . Asthma Brother        childhood  . Stroke Paternal Grandmother 70  . Heart attack Maternal Grandmother 80       questionable  . Heart attack Maternal Grandfather 57  . Parkinsonism Maternal Grandfather   . Hypertension Sister   . Appendicitis Sister        ruptured  . Colon cancer Neg Hx   . Esophageal cancer Neg Hx   . Rectal cancer Neg Hx   . Stomach cancer Neg Hx      Current Outpatient Medications (Cardiovascular):  .  atorvastatin (LIPITOR) 10 MG tablet, Take 1 tablet (10 mg total) by mouth every other day. Pt needs to keep upcoming appt in sept to continue  getting refills .  metoprolol succinate (TOPROL-XL) 50 MG 24 hr tablet, TAKE 1 TABLET ONCE DAILY. TAKE WITH OR IMMEDIATELY FOLLOWING A MEAL. .  nitroGLYCERIN (NITROSTAT) 0.4 MG SL tablet, Place 1 tablet (0.4 mg total) under the tongue every 5 (five) minutes as needed for chest pain.   Current Outpatient Medications (Analgesics):  .  aspirin 81 MG tablet, Take 81 mg by mouth daily.   .  naproxen sodium (ALEVE) 220 MG tablet, Take 220 mg by mouth daily as needed. .  meloxicam (MOBIC) 15 MG tablet, Take 1 tablet (15 mg total) by mouth daily.  Current Outpatient Medications (Hematological):  [name]  Cyanocobalamin (VITAMIN B-12 PO), Take by mouth as directed.  Current Outpatient Medications (Other):  [name]  Cholecalciferol (VITAMIN D3) 1000 UNITS CAPS, Take 1 capsule by mouth daily.  .  Multiple Vitamin (MULTIVITAMIN) tablet, Take 1 tablet by mouth daily.      Past medical history, social, surgical and family history all reviewed in electronic medical record.  No pertanent information unless stated regarding to the chief complaint.   Review of Systems:  No headache, visual changes, nausea, vomiting, diarrhea, constipation, dizziness, abdominal pain, skin rash, fevers, chills, night sweats, weight loss, swollen lymph nodes, body aches, joint swelling,  chest pain, shortness of breath, mood changes.   Objective  Blood pressure (!) 144/78, pulse 75, height 5\' 9"  (1.753 m), weight 236 lb (107 kg), SpO2 98 %.    General: No apparent distress alert and oriented x3 mood and affect normal, dressed appropriately.  HEENT: Pupils equal, extraocular movements intact  Respiratory: Patient's speak in full sentences and does not appear short of breath  Cardiovascular: No lower extremity edema, non tender, no erythema  Skin: Warm dry intact with no signs of infection or rash on extremities or on axial skeleton.  Abdomen: Soft nontender  Neuro: Cranial nerves II through XII are intact, neurovascularly intact in  all extremities with 2+ DTRs and 2+ pulses.  Lymph: No lymphadenopathy of posterior or anterior cervical chain or axillae bilaterally.  Gait mild antalgic MSK:  tender with mild limited range of motion and good stability and symmetric strength and tone of shoulders, elbows, wrist, hip, knee and ankles bilaterally.   Lower back exam does have some loss of lordosis.  Tender to palpation paraspinal musculature.  Tightness noted.  Positive Faber on the left side greater than right.  Tightness noted with the left side hamstrings but no true radicular symptoms.  Procedure: Real-time Ultrasound Guided Injection of left piriformis tendon sheath Device: GE Logiq Q7 Ultrasound guided injection is preferred based studies that show increased duration, increased effect, greater accuracy, decreased procedural pain, increased response rate, and decreased cost with ultrasound guided versus blind injection.  Verbal informed consent obtained.  Time-out conducted.  Noted no overlying erythema, induration, or other signs of local infection.  Skin prepped in a sterile fashion.  Local anesthesia: Topical Ethyl chloride.  With sterile technique and under real time ultrasound guidance: With a 21-gauge 2 inch needle injected with 0.5 cc of 0.5% Marcaine and 1 cc of Kenalog 40 mg/mL into the gluteal tendon sheath. Completed without difficulty  Pain immediately resolved suggesting accurate placement of the medication.  Advised to call if fevers/chills, erythema, induration, drainage, or persistent bleeding.  Images permanently stored and available for review in the ultrasound unit.  Impression: Technically successful ultrasound guided injection.   Impression and Recommendations:     This case required medical decision making of moderate complexity. The above documentation has been reviewed and is accurate and complete Lyndal Pulley, DO       Note: This dictation was prepared with Dragon dictation along with  smaller phrase technology. Any transcriptional errors that result from this process are unintentional.

## 2019-02-05 NOTE — Assessment & Plan Note (Signed)
Left piriformis injection today.  Concern for more of a lumbar radiculopathy.  Meloxicam given and warned of potential side effects.  Patient has been taking ibuprofen on a regular basis and discontinued.  If patient continues to have pain I would consider advanced imaging of the MRI.  Patient will right in 2 weeks but otherwise follow-up with me again in 6 to 8 weeks

## 2019-02-05 NOTE — Patient Instructions (Signed)
Good to see you Injection today Meloxicam 15 mg daily for 10 days Write [place] in 2 weeks if not better MRI Follow up in 6-8 weeks

## 2019-03-02 ENCOUNTER — Other Ambulatory Visit: Payer: Self-pay | Admitting: Cardiovascular Disease

## 2019-03-02 DIAGNOSIS — E7849 Other hyperlipidemia: Secondary | ICD-10-CM

## 2019-03-02 DIAGNOSIS — R931 Abnormal findings on diagnostic imaging of heart and coronary circulation: Secondary | ICD-10-CM

## 2019-03-10 ENCOUNTER — Other Ambulatory Visit: Payer: Self-pay

## 2019-03-10 DIAGNOSIS — Z20822 Contact with and (suspected) exposure to covid-19: Secondary | ICD-10-CM

## 2019-03-12 LAB — NOVEL CORONAVIRUS, NAA: SARS-CoV-2, NAA: NOT DETECTED

## 2019-03-19 ENCOUNTER — Encounter: Payer: Self-pay | Admitting: Family Medicine

## 2019-03-19 ENCOUNTER — Ambulatory Visit (INDEPENDENT_AMBULATORY_CARE_PROVIDER_SITE_OTHER): Payer: Medicare Other | Admitting: Family Medicine

## 2019-03-19 DIAGNOSIS — G5702 Lesion of sciatic nerve, left lower limb: Secondary | ICD-10-CM

## 2019-03-19 DIAGNOSIS — R931 Abnormal findings on diagnostic imaging of heart and coronary circulation: Secondary | ICD-10-CM | POA: Diagnosis not present

## 2019-03-19 NOTE — Assessment & Plan Note (Signed)
Discussed with patient in great length about icing regimen and home exercise, discussed avoiding certain activities.  Patient will increase activity slowly over the course the next several weeks.  As long as patient does well follow-up as needed

## 2019-03-19 NOTE — Progress Notes (Signed)
[name] [name], [name] [id number] Phone: [phone] Subjective:   [name], am serving as a scribe for [name].   CC: Left hip pain  [id number]   02/05/2019 Left piriformis injection today.  Concern for more of a lumbar radiculopathy.  Meloxicam given and warned of potential side effects.  Patient has been taking ibuprofen on a regular basis and discontinued.  If patient continues to have pain I would consider advanced imaging of the MRI.  Patient will right in 2 weeks but otherwise follow-up with me again in 6 to 8 weeks  Update 03/19/2019 Edwin [name] is a 73 y.o. male coming in with complaint of left hip pain. Pain has improved but it didn't occur immediately. Is no longer using meloxicam. Feels that he is 95% better. If he sits for prolonged periods he does still get some soreness and tightness.  Patient states that overall he is doing relatively well, doing home exercises without having to begin discomfort and pain.     Past Medical History:  Diagnosis Date  . BPH (benign prostatic hypertrophy)   . Cancer ([place])    Basal cell of face, [name]  . Diverticulosis   . Gilbert syndrome   . Hyperglycemia   . Hyperlipidemia    NMR Lipoprofile 2005; LDL 146 (2007/1330), HDL 38, TG 169.  LDL goal = <100; Framingham Study LDL goal =<130  . Hypertension   . OSA on CPAP   . Tubular adenoma of colon 01/19/08   Past Surgical History:  Procedure Laterality Date  . COLONOSCOPY  2014   neg;  [facility]  . COLONOSCOPY W/ POLYPECTOMY  2004 & 2009   Tics; [facility]  . KNEE ARTHROSCOPY     [name], left knee  . TONSILLECTOMY    . WISDOM TOOTH EXTRACTION     Social History   Socioeconomic History  . Marital status: Married    Spouse name: Not on file  . Number of children: 2  . Years of education: 53  . Highest education level: Not on file  Occupational History  . Occupation: Merchant navy officer // Retired    Fish farm manager: [employer]  . Financial resource strain: Not hard at all  . Food insecurity    Worry: Never true    Inability: Never true  . Transportation needs    Medical: No    Non-medical: No  Tobacco Use  . Smoking status: Never Smoker  . Smokeless tobacco: Never Used  Substance and Sexual Activity  . Alcohol use: Yes    Alcohol/week: 0.0 standard drinks    Comment: socially, 3-5 beers/week  . Drug use: No  . Sexual activity: Yes  Lifestyle  . Physical activity    Days per week: 3 days    Minutes per session: 40 min  . Stress: Only a little  Relationships  . Social connections    Talks on phone: More than three times a week    Gets together: More than three times a week    Attends religious service: More than 4 times per year    Active member of club or organization: Not on file    Attends meetings of clubs or organizations: More than 4 times per year    Relationship status: Married  Other Topics Concern  . Not on file  Social History Narrative   Fun: Travel, read, movies, concerts  Denies abuse and feels safe at home.    No Known Allergies Family History  Problem Relation Age of Onset  . Diverticulosis Mother   . Stroke Mother        late 75s  . Parkinsonism Mother   . Diabetes Father   . Hypertension Father   . Heart disease Father 64       CABG  . Cancer Brother        lymphoma  . Appendicitis Brother        ruptured  . Asthma Brother        childhood  . Stroke Paternal Grandmother 80  . Heart attack Maternal Grandmother 80       questionable  . Heart attack Maternal Grandfather 57  . Parkinsonism Maternal Grandfather   . Hypertension Sister   . Appendicitis Sister        ruptured  . Colon cancer Neg Hx   . Esophageal cancer Neg Hx   . Rectal cancer Neg Hx   . Stomach cancer Neg Hx      Current Outpatient Medications (Cardiovascular):  .  atorvastatin (LIPITOR) 10 MG tablet, TAKE ONE TABLET  EVERY OTHER DAY. .  metoprolol succinate (TOPROL-XL) 50 MG 24 hr tablet, TAKE 1 TABLET ONCE DAILY. TAKE WITH OR IMMEDIATELY FOLLOWING A MEAL. .  nitroGLYCERIN (NITROSTAT) 0.4 MG SL tablet, Place 1 tablet (0.4 mg total) under the tongue every 5 (five) minutes as needed for chest pain.   Current Outpatient Medications (Analgesics):  .  aspirin 81 MG tablet, Take 81 mg by mouth daily.   .  meloxicam (MOBIC) 15 MG tablet, Take 1 tablet (15 mg total) by mouth daily. .  naproxen sodium (ALEVE) 220 MG tablet, Take 220 mg by mouth daily as needed.  Current Outpatient Medications (Hematological):  [name]  Cyanocobalamin (VITAMIN B-12 PO), Take by mouth as directed.  Current Outpatient Medications (Other):  [name]  Cholecalciferol (VITAMIN D3) 1000 UNITS CAPS, Take 1 capsule by mouth daily.  .  Multiple Vitamin (MULTIVITAMIN) tablet, Take 1 tablet by mouth daily.      Past medical history, social, surgical and family history all reviewed in electronic medical record.  No pertanent information unless stated regarding to the chief complaint.   Review of Systems:  No headache, visual changes, nausea, vomiting, diarrhea, constipation, dizziness, abdominal pain, skin rash, fevers, chills, night sweats, weight loss, swollen lymph nodes, body aches, joint swelling, muscle aches, chest pain, shortness of breath, mood changes.   Objective  Blood pressure 116/70, pulse (!) 59, height 5\' 9"  (1.753 m), weight 234 lb (106.1 kg), SpO2 98 %.   General: No apparent distress alert and oriented x3 mood and affect normal, dressed appropriately.  HEENT: Pupils equal, extraocular movements intact  Respiratory: Patient's speak in full sentences and does not appear short of breath  Cardiovascular: No lower extremity edema, non tender, no erythema  Skin: Warm dry intact with no signs of infection or rash on extremities or on axial skeleton.  Abdomen: Soft nontender  Neuro: Cranial nerves II through XII are intact,  neurovascularly intact in all extremities with 2+ DTRs and 2+ pulses.  Lymph: No lymphadenopathy of posterior or anterior cervical chain or axillae bilaterally.  Gait normal with good balance and coordination.  MSK:  tender with full range of motion and good stability and symmetric strength and tone of shoulders, elbows, wrist, hip, knee and ankles bilaterally.  Low back exam does have some tenderness in the paraspinal musculature lumbar spine  right greater than left.  Negative straight leg test.  Tightness with Corky Sox test bilaterally.  Worse on the left    Impression and Recommendations:      The above documentation has been reviewed and is accurate and complete Lyndal Pulley, DO       Note: This dictation was prepared with Dragon dictation along with smaller phrase technology. Any transcriptional errors that result from this process are unintentional.

## 2019-04-01 ENCOUNTER — Other Ambulatory Visit: Payer: Self-pay

## 2019-04-21 DIAGNOSIS — L570 Actinic keratosis: Secondary | ICD-10-CM | POA: Diagnosis not present

## 2019-04-21 DIAGNOSIS — L821 Other seborrheic keratosis: Secondary | ICD-10-CM | POA: Diagnosis not present

## 2019-04-21 DIAGNOSIS — D225 Melanocytic nevi of trunk: Secondary | ICD-10-CM | POA: Diagnosis not present

## 2019-04-21 DIAGNOSIS — D2262 Melanocytic nevi of left upper limb, including shoulder: Secondary | ICD-10-CM | POA: Diagnosis not present

## 2019-04-21 DIAGNOSIS — Z85828 Personal history of other malignant neoplasm of skin: Secondary | ICD-10-CM | POA: Diagnosis not present

## 2019-04-21 NOTE — Progress Notes (Signed)
Subjective:    Patient ID: Edwin [name], male    DOB: June 13, 1945, 73 y.o.   MRN: [id number]  HPI The patient is here for follow up.   He is exercising regularly - walking, recumbent bike.   He is doing [employer] and has lost weight.  His weight now has been stable.  He gets 10,000 steps in most days.    CAD, Hypertension: He is taking his medication daily. He is compliant with a low sodium diet.  He denies chest pain, palpitations, edema, shortness of breath and regular headaches.     Hyperlipidemia: He is taking his medication daily. He is compliant with a low fat/cholesterol diet. He denies myalgias.   Diabetes: He is controlling her sugars with diet He is compliant with a diabetic diet.   He denies N/T in feet - occasional feeling of a Band-Aid on toes. He is up to date with eye exams.        Medications and allergies reviewed with patient and updated if appropriate.  Patient Active Problem List   Diagnosis Date Noted  . Piriformis syndrome of left side 02/05/2019  . CAD (coronary artery disease) 02/03/2019  . Greater trochanteric bursitis 01/01/2019  . Degenerative arthritis of left knee 05/08/2018  . Diabetes mellitus without complication ([place]) 123XX123  . Muscle cramping 02/27/2018  . Subclinical hypothyroidism 02/27/2018  . Seasonal allergic rhinitis due to pollen 09/09/2017  . Left ear pain 09/09/2017  . AC (acromioclavicular) arthritis 08/14/2017  . Tear of MCL (medial collateral ligament) of knee, right, initial encounter 06/05/2017  . Piriformis syndrome of right side 02/19/2017  . Lower back pain 02/15/2017  . Patellofemoral arthritis of right knee 12/30/2014  . Obesity 11/18/2014  . Nonspecific abnormal electrocardiogram (ECG) (EKG) 08/18/2014  . OSA (obstructive sleep apnea) 06/02/2014  . Achilles tendinosis 12/26/2012  . RBBB 05/20/2012  . Hyperlipidemia 03/03/2008  . Essential hypertension 03/03/2008  . History of colonic polyps 03/03/2008  . History  of basal cell cancer 05/26/2007  . GILBERT'S SYNDROME 05/26/2007  . DIVERTICULOSIS, COLON 05/26/2007  . BPH (benign prostatic hyperplasia) 05/26/2007    Current Outpatient Medications on File Prior to Visit  Medication Sig Dispense Refill  . aspirin 81 MG tablet Take 81 mg by mouth daily.      [name] atorvastatin (LIPITOR) 10 MG tablet TAKE ONE TABLET EVERY OTHER DAY. 90 tablet 3  . Cholecalciferol (VITAMIN D3) 1000 UNITS CAPS Take 1 capsule by mouth daily.     . Cyanocobalamin (VITAMIN B-12 PO) Take by mouth as directed.    . metoprolol succinate (TOPROL-XL) 50 MG 24 hr tablet TAKE 1 TABLET ONCE DAILY. TAKE WITH OR IMMEDIATELY FOLLOWING A MEAL. 90 tablet 3  . Multiple Vitamin (MULTIVITAMIN) tablet Take 1 tablet by mouth daily.      . naproxen sodium (ALEVE) 220 MG tablet Take 220 mg by mouth daily as needed.    . nitroGLYCERIN (NITROSTAT) 0.4 MG SL tablet Place 1 tablet (0.4 mg total) under the tongue every 5 (five) minutes as needed for chest pain. 25 tablet 3   No current facility-administered medications on file prior to visit.    Past Medical History:  Diagnosis Date  . BPH (benign prostatic hypertrophy)   . Cancer ([place])    Basal cell of face, [name]  . Diverticulosis   . Gilbert syndrome   . Hyperglycemia   . Hyperlipidemia    NMR Lipoprofile 2005; LDL 146 (2007/1330), HDL 38, TG 169.  LDL  goal = <100; Framingham Study LDL goal =<130  . Hypertension   . OSA on CPAP   . Tubular adenoma of colon 01/19/08    Past Surgical History:  Procedure Laterality Date  . COLONOSCOPY  2014   neg; [facility]  . COLONOSCOPY W/ POLYPECTOMY  2004 & 2009   Tics; [facility]  . KNEE ARTHROSCOPY     [name], left knee  . TONSILLECTOMY    . WISDOM TOOTH EXTRACTION      Social History   Socioeconomic History  . Marital status: Married    Spouse name: Not on file  . Number of children: 2  . Years of education: 30  . Highest education level: Not on file  Occupational  History  . Occupation: Pharmacist, hospital // Retired    Fish farm manager: [employer]  Tobacco Use  . Smoking status: Never Smoker  . Smokeless tobacco: Never Used  Substance and Sexual Activity  . Alcohol use: Yes    Alcohol/week: 0.0 standard drinks    Comment: socially, 3-5 beers/week  . Drug use: No  . Sexual activity: Yes  Other Topics Concern  . Not on file  Social History Narrative   Fun: Travel, read, movies, concerts    Denies abuse and feels safe at home.    Social Determinants of Health   Financial Resource Strain:   . Difficulty of Paying Living Expenses: Not on file  Food Insecurity:   . Worried About Charity fundraiser in the Last Year: Not on file  . Ran Out of Food in the Last Year: Not on file  Transportation Needs:   . Lack of Transportation (Medical): Not on file  . Lack of Transportation (Non-Medical): Not on file  Physical Activity:   . Days of Exercise per Week: Not on file  . Minutes of Exercise per Session: Not on file  Stress:   . Feeling of Stress : Not on file  Social Connections:   . Frequency of Communication with Friends and Family: Not on file  . Frequency of Social Gatherings with Friends and Family: Not on file  . Attends Religious Services: Not on file  . Active Member of Clubs or Organizations: Not on file  . Attends Archivist Meetings: Not on file  . Marital Status: Not on file    Family History  Problem Relation Age of Onset  . Diverticulosis Mother   . Stroke Mother        late 56s  . Parkinsonism Mother   . Diabetes Father   . Hypertension Father   . Heart disease Father 60       CABG  . Cancer Brother        lymphoma  . Appendicitis Brother        ruptured  . Asthma Brother        childhood  . Stroke Paternal Grandmother 59  . Heart attack Maternal Grandmother 80       questionable  . Heart attack Maternal Grandfather 57  . Parkinsonism Maternal Grandfather   . Hypertension Sister   . Appendicitis  Sister        ruptured  . Colon cancer Neg Hx   . Esophageal cancer Neg Hx   . Rectal cancer Neg Hx   . Stomach cancer Neg Hx     Review of Systems  Constitutional: Negative for chills, fatigue and fever.  Respiratory: Negative for cough, shortness of breath and wheezing.   Cardiovascular:  Negative for chest pain, palpitations and leg swelling.  Neurological: Negative for dizziness, light-headedness, numbness and headaches.       Objective:   Vitals:   04/22/19 0855  BP: 128/76  Pulse: (!) 57  Resp: 14  Temp: 98.3 F (36.8 C)  SpO2: 99%   BP Readings from Last 3 Encounters:  04/22/19 128/76  03/19/19 116/70  02/05/19 (!) 144/78   Wt Readings from Last 3 Encounters:  04/22/19 236 lb (107 kg)  03/19/19 234 lb (106.1 kg)  02/05/19 236 lb (107 kg)   Body mass index is 34.85 kg/m.   Physical Exam    Constitutional: Appears well-developed and well-nourished. No distress.  HENT:  Head: Normocephalic and atraumatic.  Neck: Neck supple. No tracheal deviation present. No thyromegaly present.  No cervical lymphadenopathy Cardiovascular: Normal rate, regular rhythm and normal heart sounds.  No murmur heard. No carotid bruit .  No edema Pulmonary/Chest: Effort normal and breath sounds normal. No respiratory distress. No has no wheezes. No rales.  Skin: Skin is warm and dry. Not diaphoretic.  Psychiatric: Normal mood and affect. Behavior is normal.      Assessment & Plan:    See Problem List for Assessment and Plan of chronic medical problems.    This visit occurred during the SARS-CoV-2 public health emergency.  Safety protocols were in place, including screening questions prior to the visit, additional usage of staff PPE, and extensive cleaning of exam room while observing appropriate contact time as indicated for disinfecting solutions.      Fu 6 months

## 2019-04-21 NOTE — Patient Instructions (Addendum)
  Tests ordered today. Your results will be released to MyChart (or called to you) after review.  If any changes need to be made, you will be notified at that same time.    Medications reviewed and updated.  Changes include :   none     Please followup in 6 months   

## 2019-04-22 ENCOUNTER — Other Ambulatory Visit (INDEPENDENT_AMBULATORY_CARE_PROVIDER_SITE_OTHER): Payer: Medicare Other

## 2019-04-22 ENCOUNTER — Encounter: Payer: Self-pay | Admitting: Internal Medicine

## 2019-04-22 ENCOUNTER — Ambulatory Visit (INDEPENDENT_AMBULATORY_CARE_PROVIDER_SITE_OTHER): Payer: Medicare Other | Admitting: Internal Medicine

## 2019-04-22 ENCOUNTER — Other Ambulatory Visit: Payer: Self-pay

## 2019-04-22 VITALS — BP 128/76 | HR 57 | Temp 98.3°F | Resp 14 | Ht 69.0 in | Wt 236.0 lb

## 2019-04-22 DIAGNOSIS — E782 Mixed hyperlipidemia: Secondary | ICD-10-CM | POA: Diagnosis not present

## 2019-04-22 DIAGNOSIS — E038 Other specified hypothyroidism: Secondary | ICD-10-CM

## 2019-04-22 DIAGNOSIS — I2510 Atherosclerotic heart disease of native coronary artery without angina pectoris: Secondary | ICD-10-CM | POA: Diagnosis not present

## 2019-04-22 DIAGNOSIS — E039 Hypothyroidism, unspecified: Secondary | ICD-10-CM | POA: Diagnosis not present

## 2019-04-22 DIAGNOSIS — R931 Abnormal findings on diagnostic imaging of heart and coronary circulation: Secondary | ICD-10-CM

## 2019-04-22 DIAGNOSIS — I10 Essential (primary) hypertension: Secondary | ICD-10-CM

## 2019-04-22 DIAGNOSIS — E119 Type 2 diabetes mellitus without complications: Secondary | ICD-10-CM

## 2019-04-22 LAB — LIPID PANEL
Cholesterol: 130 mg/dL (ref 0–200)
HDL: 40 mg/dL (ref >39.00)
LDL Cholesterol: 60 mg/dL (ref 0–99)
NonHDL: 89.63
Total CHOL/HDL Ratio: 3
Triglycerides: 149 mg/dL (ref 0.0–149.0)
VLDL: 29.8 mg/dL (ref 0.0–40.0)

## 2019-04-22 LAB — COMPREHENSIVE METABOLIC PANEL
ALT: 25 U/L (ref 0–53)
AST: 18 U/L (ref 0–37)
Albumin: 4 g/dL (ref 3.5–5.2)
Alkaline Phosphatase: 36 U/L — ABNORMAL LOW (ref 39–117)
BUN: 24 mg/dL — ABNORMAL HIGH (ref 6–23)
CO2: 30 mEq/L (ref 19–32)
Calcium: 9.4 mg/dL (ref 8.4–10.5)
Chloride: 105 mEq/L (ref 96–112)
Creatinine, Ser: 0.94 mg/dL (ref 0.40–1.50)
GFR: 79.04 mL/min (ref >60.00)
Glucose, Bld: 121 mg/dL — ABNORMAL HIGH (ref 70–99)
Potassium: 4.1 mEq/L (ref 3.5–5.1)
Sodium: 141 mEq/L (ref 135–145)
Total Bilirubin: 0.7 mg/dL (ref 0.2–1.2)
Total Protein: 6.7 g/dL (ref 6.0–8.3)

## 2019-04-22 LAB — HEMOGLOBIN A1C: Hgb A1c MFr Bld: 5.5 % (ref 4.6–6.5)

## 2019-04-22 LAB — TSH: TSH: 4.33 u[IU]/mL (ref 0.35–4.50)

## 2019-04-22 NOTE — Assessment & Plan Note (Signed)
No concerning symptoms for angina Continue regular exercise Working on weight loss On ASA, statin, metoprolol - continue Cmp, lipid, tsh

## 2019-04-22 NOTE — Assessment & Plan Note (Signed)
BP well controlled Current regimen effective and well tolerated Continue current medications at current doses cmp  

## 2019-04-22 NOTE — Assessment & Plan Note (Addendum)
Diet controlled Continue regular exercise Continue low sugars/carb diet Working on weight loss

## 2019-04-22 NOTE — Assessment & Plan Note (Signed)
Clinically euthyroid Check tsh

## 2019-04-22 NOTE — Assessment & Plan Note (Signed)
Check lipid panel  Continue daily statin Regular exercise and healthy diet encouraged  

## 2019-05-27 ENCOUNTER — Ambulatory Visit: Payer: Medicare HMO | Attending: Internal Medicine

## 2019-05-27 DIAGNOSIS — Z23 Encounter for immunization: Secondary | ICD-10-CM | POA: Insufficient documentation

## 2019-06-17 ENCOUNTER — Ambulatory Visit: Payer: Medicare HMO | Attending: Internal Medicine

## 2019-06-17 DIAGNOSIS — Z23 Encounter for immunization: Secondary | ICD-10-CM | POA: Insufficient documentation

## 2019-06-17 NOTE — Progress Notes (Signed)
   Covid-19 Vaccination Clinic  Name:  Edwin [name]    MRN: [id number] DOB: February 26, 1946  06/17/2019  Edwin [name] was observed post Covid-19 immunization for 15 minutes without incidence. He was provided with Vaccine Information Sheet and instruction to access the V-Safe system.   Edwin [name] was instructed to call 911 with any severe reactions post vaccine: [name] Difficulty breathing  . Swelling of your face and throat  . A fast heartbeat  . A bad rash all over your body  . Dizziness and weakness    Immunizations Administered    Name Date Dose VIS Date Route   Pfizer COVID-19 Vaccine 06/17/2019 10:23 AM 0.3 mL 04/17/2019 Intramuscular   Manufacturer: [place]   Lot: [id number]   [place]: [id number]

## 2019-10-22 ENCOUNTER — Telehealth: Payer: Self-pay | Admitting: Internal Medicine

## 2019-10-22 DIAGNOSIS — I10 Essential (primary) hypertension: Secondary | ICD-10-CM

## 2019-10-22 DIAGNOSIS — E119 Type 2 diabetes mellitus without complications: Secondary | ICD-10-CM

## 2019-10-22 DIAGNOSIS — E782 Mixed hyperlipidemia: Secondary | ICD-10-CM

## 2019-10-22 NOTE — Telephone Encounter (Signed)
   Patient requesting order for labs prior to appointment on 6/22

## 2019-10-24 NOTE — Telephone Encounter (Signed)
ordered

## 2019-10-26 DIAGNOSIS — D1801 Hemangioma of skin and subcutaneous tissue: Secondary | ICD-10-CM | POA: Diagnosis not present

## 2019-10-26 DIAGNOSIS — L570 Actinic keratosis: Secondary | ICD-10-CM | POA: Diagnosis not present

## 2019-10-26 DIAGNOSIS — L918 Other hypertrophic disorders of the skin: Secondary | ICD-10-CM | POA: Diagnosis not present

## 2019-10-26 DIAGNOSIS — D2261 Melanocytic nevi of right upper limb, including shoulder: Secondary | ICD-10-CM | POA: Diagnosis not present

## 2019-10-26 DIAGNOSIS — D2262 Melanocytic nevi of left upper limb, including shoulder: Secondary | ICD-10-CM | POA: Diagnosis not present

## 2019-10-26 DIAGNOSIS — L821 Other seborrheic keratosis: Secondary | ICD-10-CM | POA: Diagnosis not present

## 2019-10-26 DIAGNOSIS — D225 Melanocytic nevi of trunk: Secondary | ICD-10-CM | POA: Diagnosis not present

## 2019-10-26 DIAGNOSIS — D3617 Benign neoplasm of peripheral nerves and autonomic nervous system of trunk, unspecified: Secondary | ICD-10-CM | POA: Diagnosis not present

## 2019-10-26 DIAGNOSIS — Z85828 Personal history of other malignant neoplasm of skin: Secondary | ICD-10-CM | POA: Diagnosis not present

## 2019-10-26 NOTE — Patient Instructions (Addendum)
  Blood work was ordered.     Medications reviewed and updated.  Changes include :   Start topical ciclopirox for your right large toe.  Your prescription(s) have been submitted to your pharmacy. Please take as directed and contact our office if you believe you are having problem(s) with the medication(s).     Please followup in 6 months

## 2019-10-26 NOTE — Progress Notes (Signed)
Subjective:    Patient ID: Edwin [name], male    DOB: 1945/11/23, 74 y.o.   MRN: [id number]  HPI The patient is here for follow up of their chronic medical problems, including CAD, htn, hyperlipidemia, diabetes.  He is taking all of his medications as prescribed.    He is exercising regularly.   He walks and rides a recumbent bike.   He is eating healthy and has lost weight.  He feels his weight has plateaued and he is trying to get his weight down further.  He is thinking about working with a Physiological scientist and may be trying Pilates.   Medications and allergies reviewed with patient and updated if appropriate.  Patient Active Problem List   Diagnosis Date Noted   Onychomycosis of right great toe 10/27/2019   Piriformis syndrome of left side 02/05/2019   CAD (coronary artery disease) 02/03/2019   Greater trochanteric bursitis 01/01/2019   Degenerative arthritis of left knee 05/08/2018   Diabetes mellitus without complication ([place]) 97/06/6376   Muscle cramping 02/27/2018   Subclinical hypothyroidism 02/27/2018   Seasonal allergic rhinitis due to pollen 09/09/2017   Left ear pain 09/09/2017   AC (acromioclavicular) arthritis 08/14/2017   Tear of MCL (medial collateral ligament) of knee, right, initial encounter 06/05/2017   Piriformis syndrome of right side 02/19/2017   Lower back pain 02/15/2017   Patellofemoral arthritis of right knee 12/30/2014   Obesity 11/18/2014   Nonspecific abnormal electrocardiogram (ECG) (EKG) 08/18/2014   OSA (obstructive sleep apnea) 06/02/2014   Achilles tendinosis 12/26/2012   RBBB 05/20/2012   Hyperlipidemia 03/03/2008   Essential hypertension 03/03/2008   History of colonic polyps 03/03/2008   History of basal cell cancer 05/26/2007   GILBERT'S SYNDROME 05/26/2007   DIVERTICULOSIS, COLON 05/26/2007   BPH (benign prostatic hyperplasia) 05/26/2007    Current Outpatient Medications on File Prior to Visit    Medication Sig Dispense Refill   aspirin 81 MG tablet Take 81 mg by mouth daily.       atorvastatin (LIPITOR) 10 MG tablet TAKE ONE TABLET EVERY OTHER DAY. 90 tablet 3   Cholecalciferol (VITAMIN D3) 1000 UNITS CAPS Take 1 capsule by mouth daily.      Cyanocobalamin (VITAMIN B-12 PO) Take by mouth as directed.     metoprolol succinate (TOPROL-XL) 50 MG 24 hr tablet TAKE 1 TABLET ONCE DAILY. TAKE WITH OR IMMEDIATELY FOLLOWING A MEAL. 90 tablet 3   Multiple Vitamin (MULTIVITAMIN) tablet Take 1 tablet by mouth daily.       naproxen sodium (ALEVE) 220 MG tablet Take 220 mg by mouth daily as needed.     nitroGLYCERIN (NITROSTAT) 0.4 MG SL tablet Place 1 tablet (0.4 mg total) under the tongue every 5 (five) minutes as needed for chest pain. 25 tablet 3   No current facility-administered medications on file prior to visit.    Past Medical History:  Diagnosis Date   BPH (benign prostatic hypertrophy)    Cancer ([place])    Basal cell of face, [name]   Diverticulosis    Rosanna Randy syndrome    Hyperglycemia    Hyperlipidemia    NMR Lipoprofile 2005; LDL 146 (2007/1330), HDL 38, TG 169.  LDL goal = <100; Framingham Study LDL goal =<130   Hypertension    OSA on CPAP    Tubular adenoma of colon 01/19/08    Past Surgical History:  Procedure Laterality Date   COLONOSCOPY  2014   neg; [facility]  COLONOSCOPY W/ POLYPECTOMY  2004 & 2009   Tics; [facility]   KNEE ARTHROSCOPY     [name], left knee   TONSILLECTOMY     WISDOM TOOTH EXTRACTION      Social History   Socioeconomic History   Marital status: Married    Spouse name: Not on file   Number of children: 2   Years of education: 18   Highest education level: Not on file  Occupational History   Occupation: Pharmacist, hospital // Retired    Fish farm manager: [employer]  Tobacco Use   Smoking status: Never Smoker   Smokeless tobacco: Never Used  Scientific laboratory technician Use: Never used   Substance and Sexual Activity   Alcohol use: Yes    Alcohol/week: 0.0 standard drinks    Comment: socially, 3-5 beers/week   Drug use: No   Sexual activity: Yes  Other Topics Concern   Not on file  Social History Narrative   Fun: Travel, read, movies, concerts    Denies abuse and feels safe at home.    Social Determinants of Health   Financial Resource Strain:    Difficulty of Paying Living Expenses:   Food Insecurity:    Worried About Charity fundraiser in the Last Year:    Arboriculturist in the Last Year:   Transportation Needs:    Film/video editor (Medical):    Lack of Transportation (Non-Medical):   Physical Activity:    Days of Exercise per Week:    Minutes of Exercise per Session:   Stress:    Feeling of Stress :   Social Connections:    Frequency of Communication with Friends and Family:    Frequency of Social Gatherings with Friends and Family:    Attends Religious Services:    Active Member of Clubs or Organizations:    Attends Music therapist:    Marital Status:     Family History  Problem Relation Age of Onset   Diverticulosis Mother    Stroke Mother        late 47s   Parkinsonism Mother    Diabetes Father    Hypertension Father    Heart disease Father 23       CABG   Cancer Brother        lymphoma   Appendicitis Brother        ruptured   Asthma Brother        childhood   Stroke Paternal Grandmother 48   Heart attack Maternal Grandmother 80       questionable   Heart attack Maternal Grandfather 107   Parkinsonism Maternal Grandfather    Hypertension Sister    Appendicitis Sister        ruptured   Colon cancer Neg Hx    Esophageal cancer Neg Hx    Rectal cancer Neg Hx    Stomach cancer Neg Hx     Review of Systems  Constitutional: Negative for chills and fever.  Respiratory: Negative for cough, shortness of breath and wheezing.   Cardiovascular: Negative for chest pain,  palpitations and leg swelling.  Neurological: Negative for dizziness, light-headedness, numbness and headaches.       Objective:   Vitals:   10/27/19 0910  BP: 126/72  Pulse: 64  Temp: 97.8 F (36.6 C)  SpO2: 97%   BP Readings from Last 3 Encounters:  10/27/19 126/72  04/22/19 128/76  03/19/19 116/70   Wt Readings  from Last 3 Encounters:  10/27/19 233 lb (105.7 kg)  04/22/19 236 lb (107 kg)  03/19/19 234 lb (106.1 kg)   Body mass index is 34.41 kg/m.   Physical Exam    Constitutional: Appears well-developed and well-nourished. No distress.  HENT:  Head: Normocephalic and atraumatic.  Neck: Neck supple. No tracheal deviation present. No thyromegaly present.  No cervical lymphadenopathy Cardiovascular: Normal rate, regular rhythm and normal heart sounds.  No murmur heard. No carotid bruit .  No edema Pulmonary/Chest: Effort normal and breath sounds normal. No respiratory distress. No has no wheezes. No rales.  Skin: Skin is warm and dry. Not diaphoretic.  Onychomycosis right large toenail Psychiatric: Normal mood and affect. Behavior is normal.      Assessment & Plan:    See Problem List for Assessment and Plan of chronic medical problems.    This visit occurred during the SARS-CoV-2 public health emergency.  Safety protocols were in place, including screening questions prior to the visit, additional usage of staff PPE, and extensive cleaning of exam room while observing appropriate contact time as indicated for disinfecting solutions.

## 2019-10-26 NOTE — Telephone Encounter (Signed)
Message left for patient to return call to clinic to set up lab appointment. 

## 2019-10-27 ENCOUNTER — Other Ambulatory Visit: Payer: Self-pay

## 2019-10-27 ENCOUNTER — Ambulatory Visit (INDEPENDENT_AMBULATORY_CARE_PROVIDER_SITE_OTHER): Payer: Medicare HMO | Admitting: Internal Medicine

## 2019-10-27 ENCOUNTER — Encounter: Payer: Self-pay | Admitting: Internal Medicine

## 2019-10-27 VITALS — BP 126/72 | HR 64 | Temp 97.8°F | Ht 69.0 in | Wt 233.0 lb

## 2019-10-27 DIAGNOSIS — Z125 Encounter for screening for malignant neoplasm of prostate: Secondary | ICD-10-CM

## 2019-10-27 DIAGNOSIS — E782 Mixed hyperlipidemia: Secondary | ICD-10-CM | POA: Diagnosis not present

## 2019-10-27 DIAGNOSIS — E669 Obesity, unspecified: Secondary | ICD-10-CM

## 2019-10-27 DIAGNOSIS — B351 Tinea unguium: Secondary | ICD-10-CM

## 2019-10-27 DIAGNOSIS — E119 Type 2 diabetes mellitus without complications: Secondary | ICD-10-CM

## 2019-10-27 DIAGNOSIS — G4733 Obstructive sleep apnea (adult) (pediatric): Secondary | ICD-10-CM

## 2019-10-27 DIAGNOSIS — Z6834 Body mass index (BMI) 34.0-34.9, adult: Secondary | ICD-10-CM

## 2019-10-27 DIAGNOSIS — I2510 Atherosclerotic heart disease of native coronary artery without angina pectoris: Secondary | ICD-10-CM

## 2019-10-27 DIAGNOSIS — I10 Essential (primary) hypertension: Secondary | ICD-10-CM

## 2019-10-27 MED ORDER — CICLOPIROX 8 % EX SOLN: Freq: Every day | CUTANEOUS | 8 refills | Status: DC

## 2019-10-27 NOTE — Assessment & Plan Note (Signed)
Chronic Diet controlled A1c above 6.5% once-last A1c in normal range Clinically not diabetic Continue regular exercise, healthy diet and weight loss efforts

## 2019-10-27 NOTE — Assessment & Plan Note (Signed)
Chronic No chest pain, palpitations or shortness of breath Following with cardiology Continue aspirin, statin Blood pressure well controlled Working on weight loss and exercising regularly

## 2019-10-27 NOTE — Assessment & Plan Note (Signed)
Chronic Check lipid panel  Continue daily statin Regular exercise and healthy diet encouraged  

## 2019-10-27 NOTE — Assessment & Plan Note (Signed)
New problem, but has been present for a while Asymptomatic, but wonders about treatment Will try ciclopirox topically

## 2019-10-27 NOTE — Assessment & Plan Note (Signed)
Chronic Has lost weight, but BMI still over 30 He is exercising regularly and eating healthy We will continue to work on weight loss-next goal is weight of 215 Follow-up in 6 months

## 2019-10-27 NOTE — Assessment & Plan Note (Signed)
Chronic Uses CPAP nightly Has not had his CPAP evaluated or adjusted in a few years, but feels like he is doing well Will refer to pulmonary to reevaluate CPAP and see if changes are necessary

## 2019-10-27 NOTE — Assessment & Plan Note (Signed)
Chronic BP well controlled Current regimen effective and well tolerated Continue current medications at current doses cmp  

## 2019-10-29 ENCOUNTER — Other Ambulatory Visit (INDEPENDENT_AMBULATORY_CARE_PROVIDER_SITE_OTHER): Payer: Medicare HMO

## 2019-10-29 DIAGNOSIS — E782 Mixed hyperlipidemia: Secondary | ICD-10-CM

## 2019-10-29 DIAGNOSIS — E119 Type 2 diabetes mellitus without complications: Secondary | ICD-10-CM | POA: Diagnosis not present

## 2019-10-29 DIAGNOSIS — Z125 Encounter for screening for malignant neoplasm of prostate: Secondary | ICD-10-CM

## 2019-10-29 DIAGNOSIS — I10 Essential (primary) hypertension: Secondary | ICD-10-CM

## 2019-10-29 LAB — LIPID PANEL
Cholesterol: 137 mg/dL (ref 0–200)
HDL: 42.6 mg/dL (ref >39.00)
LDL Cholesterol: 69 mg/dL (ref 0–99)
NonHDL: 94.37
Total CHOL/HDL Ratio: 3
Triglycerides: 125 mg/dL (ref 0.0–149.0)
VLDL: 25 mg/dL (ref 0.0–40.0)

## 2019-10-29 LAB — MICROALBUMIN / CREATININE URINE RATIO
Creatinine,U: 87.8 mg/dL
Microalb Creat Ratio: 0.8 mg/g (ref 0.0–30.0)
Microalb, Ur: <0.7 mg/dL (ref 0.0–1.9)

## 2019-10-29 LAB — COMPREHENSIVE METABOLIC PANEL
ALT: 23 U/L (ref 0–53)
AST: 18 U/L (ref 0–37)
Albumin: 4.4 g/dL (ref 3.5–5.2)
Alkaline Phosphatase: 34 U/L — ABNORMAL LOW (ref 39–117)
BUN: 19 mg/dL (ref 6–23)
CO2: 27 mEq/L (ref 19–32)
Calcium: 9.4 mg/dL (ref 8.4–10.5)
Chloride: 106 mEq/L (ref 96–112)
Creatinine, Ser: 0.98 mg/dL (ref 0.40–1.50)
GFR: 75.21 mL/min (ref >60.00)
Glucose, Bld: 132 mg/dL — ABNORMAL HIGH (ref 70–99)
Potassium: 4.2 mEq/L (ref 3.5–5.1)
Sodium: 141 mEq/L (ref 135–145)
Total Bilirubin: 0.9 mg/dL (ref 0.2–1.2)
Total Protein: 6.7 g/dL (ref 6.0–8.3)

## 2019-10-29 LAB — HEMOGLOBIN A1C: Hgb A1c MFr Bld: 5.9 % (ref 4.6–6.5)

## 2019-10-29 LAB — PSA, MEDICARE: PSA: 4.13 ng/ml — ABNORMAL HIGH (ref 0.10–4.00)

## 2019-11-09 ENCOUNTER — Encounter: Payer: Self-pay | Admitting: Internal Medicine

## 2019-11-09 DIAGNOSIS — R7303 Prediabetes: Secondary | ICD-10-CM

## 2019-11-20 ENCOUNTER — Encounter: Payer: Self-pay | Admitting: Internal Medicine

## 2019-12-01 DIAGNOSIS — G4733 Obstructive sleep apnea (adult) (pediatric): Secondary | ICD-10-CM | POA: Diagnosis not present

## 2019-12-08 ENCOUNTER — Telehealth: Payer: Self-pay

## 2019-12-08 DIAGNOSIS — R972 Elevated prostate specific antigen [PSA]: Secondary | ICD-10-CM

## 2019-12-08 NOTE — Telephone Encounter (Signed)
New message    The patient is wondering what happens to his urology referral.   Asking for a callback today to discuss

## 2019-12-08 NOTE — Telephone Encounter (Signed)
   I think the message of why he needed to go did not get to me  Referral ordered.  Let him know it does take a while for them to contact him.

## 2019-12-09 NOTE — Telephone Encounter (Signed)
Message left for patient on home answering machine.

## 2019-12-11 ENCOUNTER — Other Ambulatory Visit: Payer: Self-pay

## 2019-12-11 DIAGNOSIS — E7849 Other hyperlipidemia: Secondary | ICD-10-CM

## 2019-12-11 DIAGNOSIS — R931 Abnormal findings on diagnostic imaging of heart and coronary circulation: Secondary | ICD-10-CM

## 2019-12-11 MED ORDER — ATORVASTATIN CALCIUM 10 MG PO TABS: ORAL_TABLET | ORAL | 0 refills | Status: DC

## 2019-12-11 MED ORDER — METOPROLOL SUCCINATE ER 50 MG PO TB24: ORAL_TABLET | ORAL | 0 refills | Status: DC

## 2020-01-06 ENCOUNTER — Encounter: Payer: Self-pay | Admitting: Internal Medicine

## 2020-01-12 ENCOUNTER — Institutional Professional Consult (permissible substitution): Payer: Medicare HMO | Admitting: Pulmonary Disease

## 2020-02-08 DIAGNOSIS — N401 Enlarged prostate with lower urinary tract symptoms: Secondary | ICD-10-CM | POA: Diagnosis not present

## 2020-02-08 DIAGNOSIS — R351 Nocturia: Secondary | ICD-10-CM | POA: Diagnosis not present

## 2020-02-08 DIAGNOSIS — R972 Elevated prostate specific antigen [PSA]: Secondary | ICD-10-CM | POA: Diagnosis not present

## 2020-02-15 DIAGNOSIS — E119 Type 2 diabetes mellitus without complications: Secondary | ICD-10-CM | POA: Diagnosis not present

## 2020-02-18 NOTE — Progress Notes (Signed)
Patient ID: HERMES WAFER, male   DOB: Dec 26, 1945, 74 y.o.   MRN: [id number]     Cardiology Office Note   Date:  03/02/2020   ID:  Edwin [name], Ekstein 06/03/1945, MRN [id number]  PCP:  [name], MD  Cardiologist:   Jenkins Rouge, MD   No chief complaint on file.     History of Present Illness: Edwin [name] is a 74 y.o. male initially seen for abnormal ECG 09/2014    Had ETT with PA in April Exercised for about 7 mintues Baseline ECG with RBBB with nonspecific ST changes  Should not have had treadmill with baseline abnormal ECG  As these were accentuated with stress No VT. CRF;s HTN on Rx including statin  Sleep apnea   Wears CPAP  .    09/11/14 cardiac CT showed very high calcium score 1010 which was 91 st percentile with moderate mid LAD/RCA disease   Has had non ischemic myovue July 2016 and August 2016 with normal EF  Had a nice trip to [place] and [place] 2019  Seen by Charlann Boxer 2020 for trochanteric bursitis with injection  Eating better walks and rides recumbent bike   No cardiac complaints Compliant with his meds and CPAP   [place] with [place]   Past Medical History:  Diagnosis Date  . BPH (benign prostatic hypertrophy)   . Cancer ([place])    Basal cell of face, [name]  . Diverticulosis   . Gilbert syndrome   . Hyperglycemia   . Hyperlipidemia    NMR Lipoprofile 2005; LDL 146 (2007/1330), HDL 38, TG 169.  LDL goal = <100; Framingham Study LDL goal =<130  . Hypertension   . OSA on CPAP   . Tubular adenoma of colon 01/19/08    Past Surgical History:  Procedure Laterality Date  . COLONOSCOPY  2014   neg; [facility]  . COLONOSCOPY W/ POLYPECTOMY  2004 & 2009   Tics; [facility]  . KNEE ARTHROSCOPY     [name], left knee  . TONSILLECTOMY    . WISDOM TOOTH EXTRACTION       Current Outpatient Medications  Medication Sig Dispense Refill  . aspirin 81 MG tablet Take 81 mg by mouth daily.      [name] atorvastatin (LIPITOR) 10  MG tablet TAKE ONE TABLET EVERY OTHER DAY. Please make yearly appt with Dr. [name] Cancel for September for future refills. 1st attempt 90 tablet 0  . Cholecalciferol (VITAMIN D3) 1000 UNITS CAPS Take 1 capsule by mouth daily.     . Cyanocobalamin (VITAMIN B-12 PO) Take by mouth as directed.    . metoprolol succinate (TOPROL-XL) 50 MG 24 hr tablet TAKE 1 TABLET ONCE DAILY. TAKE WITH OR IMMEDIATELY FOLLOWING A MEAL. Please make yearly appt with Dr. [name] Cancel for September for future refills. 1st attempt 90 tablet 0  . Multiple Vitamin (MULTIVITAMIN) tablet Take 1 tablet by mouth daily.      . naproxen sodium (ALEVE) 220 MG tablet Take 220 mg by mouth daily as needed.    . nitroGLYCERIN (NITROSTAT) 0.4 MG SL tablet Place 1 tablet (0.4 mg total) under the tongue every 5 (five) minutes as needed for chest pain. 25 tablet 3   No current facility-administered medications for this visit.    Allergies:   Patient has no known allergies.    Social History:  The patient  reports that he has never smoked. He has never used smokeless tobacco.  He reports current alcohol use. He reports that he does not use drugs.   Family History:  The patient's family history includes Appendicitis in his brother and sister; Asthma in his brother; Cancer in his brother; Diabetes in his father; Diverticulosis in his mother; Heart attack (age of onset: 10) in his maternal grandfather; Heart attack (age of onset: 83) in his maternal grandmother; Heart disease (age of onset: 33) in his father; Hypertension in his father and sister; Parkinsonism in his maternal grandfather and mother; Stroke in his mother; Stroke (age of onset: 66) in his paternal grandmother.    ROS:  Please see the history of present illness.   Otherwise, review of systems are positive for none.   All other systems are reviewed and negative.      PHYSICAL EXAM: VS:  BP 122/80   Pulse 60   Ht 5\' 9"  (1.753 m)   Wt 239 lb 9.6 oz (108.7 kg)   SpO2 97%   BMI 35.38  kg/m  , BMI Body mass index is 35.38 kg/m. Affect appropriate Healthy:  appears stated age 41: normal Neck supple with no adenopathy JVP normal no bruits no thyromegaly Lungs clear with no wheezing and good diaphragmatic motion Heart:  S1/S2 no murmur, no rub, gallop or click PMI normal Abdomen: benighn, BS positve, no tenderness, no AAA no bruit.  No HSM or HJR Distal pulses intact with no bruits No edema Neuro non-focal Skin warm and dry No muscular weakness   EKG:   03/02/20  SR rate 60 RBBB no acute changes    Recent Labs: 04/22/2019: TSH 4.33 10/29/2019: ALT 23; BUN 19; Creatinine, Ser 0.98; Potassium 4.2; Sodium 141    Lipid Panel    Component Value Date/Time   CHOL 137 10/29/2019 0816   CHOL 202 (H) 06/02/2014 1116   TRIG 125.0 10/29/2019 0816   TRIG 213 (H) 06/02/2014 1116   TRIG 179 (H) 05/13/2006 1239   HDL 42.60 10/29/2019 0816   HDL 44 06/02/2014 1116   CHOLHDL 3 10/29/2019 0816   VLDL 25.0 10/29/2019 0816   LDLCALC 69 10/29/2019 0816   LDLCALC 115 (H) 06/02/2014 1116   LDLDIRECT 67.0 02/27/2018 1110      Wt Readings from Last 3 Encounters:  03/02/20 239 lb 9.6 oz (108.7 kg)  10/27/19 233 lb (105.7 kg)  04/22/19 236 lb (107 kg)      Other studies Reviewed: Additional studies/ records that were reviewed today include: Epic notes  ETT by DR Linna Darner. Myovue and cardiac CT 2016 Myovue 12/26/17      ASSESSMENT AND PLAN:  1. CAD: Moderate LAD/Circ disease by cardiac CT Normal perfusion study 12/26/17 continue medical Rx   2. Chol:  At goal taking lipitor qod direct LDL 69 on  10/29/19  3. RBBB:  No change on ECG yearly ECG   4. PAC;s  Benign asymptomatic on beta blocker   5. HTN:  Well controlled.  Continue current medications and low sodium Dash type diet.    6. Derm:  Post mow's with basal cell removal left nares no recurrence   7. OSA:  Continue CPAP discussed weight loss strategies   8. DM:  Discussed low carb diet.  Target hemoglobin  A1c is 6.5 or less.  Continue current medications. A1c 5.9 on 10/29/19              Disposition:   FU with me in a year     Jenkins Rouge

## 2020-02-25 ENCOUNTER — Ambulatory Visit: Payer: Medicare HMO | Admitting: Cardiovascular Disease

## 2020-03-02 ENCOUNTER — Ambulatory Visit: Payer: Medicare HMO | Admitting: Cardiovascular Disease

## 2020-03-02 ENCOUNTER — Encounter: Payer: Self-pay | Admitting: Cardiovascular Disease

## 2020-03-02 ENCOUNTER — Other Ambulatory Visit: Payer: Self-pay

## 2020-03-02 VITALS — BP 122/80 | HR 60 | Ht 69.0 in | Wt 239.6 lb

## 2020-03-02 DIAGNOSIS — I2510 Atherosclerotic heart disease of native coronary artery without angina pectoris: Secondary | ICD-10-CM

## 2020-03-02 NOTE — Addendum Note (Signed)
Addended by: Claude Manges on: 03/02/2020 09:59 AM   Modules accepted: Orders

## 2020-03-02 NOTE — Patient Instructions (Addendum)

## 2020-03-04 DIAGNOSIS — H2513 Age-related nuclear cataract, bilateral: Secondary | ICD-10-CM | POA: Diagnosis not present

## 2020-03-04 DIAGNOSIS — H43393 Other vitreous opacities, bilateral: Secondary | ICD-10-CM | POA: Diagnosis not present

## 2020-03-04 DIAGNOSIS — H35373 Puckering of macula, bilateral: Secondary | ICD-10-CM | POA: Diagnosis not present

## 2020-03-04 DIAGNOSIS — H43813 Vitreous degeneration, bilateral: Secondary | ICD-10-CM | POA: Diagnosis not present

## 2020-03-07 ENCOUNTER — Encounter: Payer: Self-pay | Admitting: Pulmonary Disease

## 2020-03-07 ENCOUNTER — Other Ambulatory Visit: Payer: Self-pay

## 2020-03-07 ENCOUNTER — Ambulatory Visit (INDEPENDENT_AMBULATORY_CARE_PROVIDER_SITE_OTHER): Payer: Medicare HMO | Admitting: Pulmonary Disease

## 2020-03-07 VITALS — BP 140/78 | HR 60 | Temp 97.6°F | Ht 69.0 in | Wt 241.8 lb

## 2020-03-07 DIAGNOSIS — E669 Obesity, unspecified: Secondary | ICD-10-CM

## 2020-03-07 DIAGNOSIS — G4730 Sleep apnea, unspecified: Secondary | ICD-10-CM

## 2020-03-07 DIAGNOSIS — G4733 Obstructive sleep apnea (adult) (pediatric): Secondary | ICD-10-CM | POA: Diagnosis not present

## 2020-03-07 NOTE — Patient Instructions (Signed)
Will have [facility] arrange for a new auto CPAP machine with pressure range 5 to 11 cm water  Will also arrange for a chin strap to use with your nasal pillows CPAP mask  Follow up in 3 months

## 2020-03-07 NOTE — Progress Notes (Signed)
[facility]  Chief Complaint  Patient presents with   Consult    Sleep Consult    Constitutional:  BP 140/78 (BP Location: Right Arm, Cuff Size: Normal)    Pulse 60    Temp 97.6 F (36.4 C) (Temporal)    Ht 5\' 9"  (1.753 m)    Wt 241 lb 12.8 oz (109.7 kg)    SpO2 98%    BMI 35.71 kg/m   Past Medical History:  BPH, Diverticulosis, Basal cell carcinoma of face, Gilbert syndrome, HLD, HTN, Colon polyp  Past Surgical History:  His  has a past surgical history that includes Tonsillectomy; Knee arthroscopy; Colonoscopy w/ polypectomy (2004 & 2009); Wisdom tooth extraction; and Colonoscopy (2014).  Brief Summary:  Edwin [name] is a 74 y.o. male with obstructive sleep apnea.      Subjective:   I last saw him in 2016.  He received his machine in 2016.  He uses [facility].  Order for his current auto CPAP machine was placed in March 2016.  He has nasal pillows mask.  He has noticed more mouth dryness.  His wife says he is more restless at night, and he gets leg cramps about once per week.  He feels like his mask fits okay, but he does get air leak.  He isn't sure the pressure from the machine is sufficient.  He goes to sleep at 1030 pm.  He falls asleep in about 15 to 30 minutes.  He wakes up 1 to 3 times to use the bathroom.  He gets out of bed between 630 and 745 am.  He feels okay in the morning.  He denies morning headache.  He does not use anything to help him fall sleep or stay awake.  He will take a nap sometimes in the afternoon for 15 to 25 minutes  He denies sleep walking, sleep talking, bruxism, or nightmares.  There is no history of restless legs.  He denies sleep hallucinations, sleep paralysis, or cataplexy.  The Epworth score is 5 out of 24.    Physical Exam:   Appearance - well kempt   ENMT - no sinus tenderness, no oral exudate, no LAN, Mallampati 4 airway, no stridor  Respiratory - equal breath sounds bilaterally, no  wheezing or rales  CV - s1s2 regular rate and rhythm, no murmurs  Ext - no clubbing, no edema  Skin - no rashes  Psych - normal mood and affect   Sleep Tests:   Auto CPAP 12/08/19 to 03/06/20 >> used on 90 of 90 nights with average 8 hrs 24 min.  Average AHI 1.5 with median CPAP 9 and 95 th percentile CPAP 12 cm H2O.  Air leak.  Social History:  He  reports that he has never smoked. He has never used smokeless tobacco. He reports current alcohol use. He reports that he does not use drugs.  Family History:  His family history includes Appendicitis in his brother and sister; Asthma in his brother; Cancer in his brother; Diabetes in his father; Diverticulosis in his mother; Heart attack (age of onset: 31) in his maternal grandfather; Heart attack (age of onset: 14) in his maternal grandmother; Heart disease (age of onset: 50) in his father; Hypertension in his father and sister; Parkinsonism in his maternal grandfather and mother; Stroke in his mother; Stroke (age of onset: 6) in his paternal grandmother.     Assessment/Plan:   Obstructive sleep apnea. - he is compliant with  CPAP and reports benefit from therapy - he gets supplies from [place] - his current machine is more than 72 yrs old, not functioning properly and not amenable to repair - he is getting mouth dryness and has air leak; he would like to continue using nasal pillows mask - will arrange for new auto CPAP with pressure range 5 to 11 cm H2O - will arrange for chin strap to use with his CPAP mask - explained he might need home sleep study for insurance purposes prior to getting new auto CPAP machine  Obesity. - he is aware of how his weight can impact his health, especially in relation to his sleep apnea  Time Spent Involved in Patient Care on Day of Examination:  37 minutes  Follow up:  Patient Instructions  Will have [facility] arrange for a new auto CPAP machine with pressure range 5 to 11 cm water  Will also  arrange for a chin strap to use with your nasal pillows CPAP mask  Follow up in 3 months   Medication List:   Allergies as of 03/07/2020   No Known Allergies     Medication List       Accurate as of March 07, 2020 11:11 AM. If you have any questions, ask your nurse or doctor.        aspirin 81 MG tablet Take 81 mg by mouth daily.   atorvastatin 10 MG tablet Commonly known as: LIPITOR TAKE ONE TABLET EVERY OTHER DAY. Please make yearly appt with Dr. [name] Cancel for September for future refills. 1st attempt   metoprolol succinate 50 MG 24 hr tablet Commonly known as: TOPROL-XL TAKE 1 TABLET ONCE DAILY. TAKE WITH OR IMMEDIATELY FOLLOWING A MEAL. Please make yearly appt with Dr. [name] Cancel for September for future refills. 1st attempt   multivitamin tablet Take 1 tablet by mouth daily.   naproxen sodium 220 MG tablet Commonly known as: ALEVE Take 220 mg by mouth daily as needed.   nitroGLYCERIN 0.4 MG SL tablet Commonly known as: NITROSTAT Place 1 tablet (0.4 mg total) under the tongue every 5 (five) minutes as needed for chest pain.   QC TUMERIC COMPLEX PO Take by mouth daily.   VITAMIN B-12 PO Take by mouth as directed.   Vitamin D3 25 MCG (1000 UT) Caps Take 1 capsule by mouth daily.       Signature:  Chesley Mires, MD [place] Pager - [phone] 03/07/2020, 11:11 AM

## 2020-03-15 ENCOUNTER — Other Ambulatory Visit: Payer: Self-pay | Admitting: Cardiovascular Disease

## 2020-04-19 DIAGNOSIS — D485 Neoplasm of uncertain behavior of skin: Secondary | ICD-10-CM | POA: Diagnosis not present

## 2020-04-19 DIAGNOSIS — D225 Melanocytic nevi of trunk: Secondary | ICD-10-CM | POA: Diagnosis not present

## 2020-04-19 DIAGNOSIS — L821 Other seborrheic keratosis: Secondary | ICD-10-CM | POA: Diagnosis not present

## 2020-04-19 DIAGNOSIS — D1801 Hemangioma of skin and subcutaneous tissue: Secondary | ICD-10-CM | POA: Diagnosis not present

## 2020-04-19 DIAGNOSIS — D3612 Benign neoplasm of peripheral nerves and autonomic nervous system, upper limb, including shoulder: Secondary | ICD-10-CM | POA: Diagnosis not present

## 2020-04-19 DIAGNOSIS — Z85828 Personal history of other malignant neoplasm of skin: Secondary | ICD-10-CM | POA: Diagnosis not present

## 2020-04-19 NOTE — Patient Instructions (Addendum)
  Blood work was ordered.       Medications changes include :   None     Please followup in 6 months   

## 2020-04-19 NOTE — Progress Notes (Signed)
Subjective:    Patient ID: Edwin [name], male    DOB: 1946/02/19, 74 y.o.   MRN: [id number]  HPI The patient is here for follow up of their chronic medical problems, including CAD, htn, hyperlipidemia, prediabetes  He is exercising regularly - walks, recumbent bike.   He denies any changes in his health. He feels he is doing okay overall.  Medications and allergies reviewed with patient and updated if appropriate.  Patient Active Problem List   Diagnosis Date Noted  . Onychomycosis of right great toe 10/27/2019  . Piriformis syndrome of left side 02/05/2019  . CAD (coronary artery disease) 02/03/2019  . Greater trochanteric bursitis 01/01/2019  . Degenerative arthritis of left knee 05/08/2018  . Prediabetes 03/01/2018  . Muscle cramping 02/27/2018  . Subclinical hypothyroidism 02/27/2018  . Seasonal allergic rhinitis due to pollen 09/09/2017  . AC (acromioclavicular) arthritis 08/14/2017  . Tear of MCL (medial collateral ligament) of knee, right, initial encounter 06/05/2017  . Piriformis syndrome of right side 02/19/2017  . Lower back pain 02/15/2017  . Patellofemoral arthritis of right knee 12/30/2014  . Obesity 11/18/2014  . Nonspecific abnormal electrocardiogram (ECG) (EKG) 08/18/2014  . OSA (obstructive sleep apnea) 06/02/2014  . Achilles tendinosis 12/26/2012  . RBBB 05/20/2012  . Hyperlipidemia 03/03/2008  . Essential hypertension 03/03/2008  . History of colonic polyps 03/03/2008  . History of basal cell cancer 05/26/2007  . GILBERT'S SYNDROME 05/26/2007  . DIVERTICULOSIS, COLON 05/26/2007  . BPH (benign prostatic hyperplasia) 05/26/2007    Current Outpatient Medications on File Prior to Visit  Medication Sig Dispense Refill  . aspirin 81 MG tablet Take 81 mg by mouth daily.    [name] atorvastatin (LIPITOR) 10 MG tablet TAKE ONE TABLET EVERY OTHER DAY. Please make yearly appt with Dr. [name] Cancel for September for future refills. 1st attempt 90 tablet 0  .  Cholecalciferol (VITAMIN D3) 1000 UNITS CAPS Take 1 capsule by mouth daily.    . Cyanocobalamin (VITAMIN B-12 PO) Take by mouth as directed.    . metoprolol succinate (TOPROL-XL) 50 MG 24 hr tablet TAKE 1 TABLET ONCE DAILY. TAKE WITH OR IMMEDIATELY FOLLOWING A MEAL. 90 tablet 3  . Multiple Vitamin (MULTIVITAMIN) tablet Take 1 tablet by mouth daily.    . naproxen sodium (ALEVE) 220 MG tablet Take 220 mg by mouth daily as needed.    . nitroGLYCERIN (NITROSTAT) 0.4 MG SL tablet Place 1 tablet (0.4 mg total) under the tongue every 5 (five) minutes as needed for chest pain. 25 tablet 3  . Turmeric (QC TUMERIC COMPLEX PO) Take by mouth daily.     No current facility-administered medications on file prior to visit.    Past Medical History:  Diagnosis Date  . BPH (benign prostatic hypertrophy)   . Cancer ([place])    Basal cell of face, [name]  . Diverticulosis   . Gilbert syndrome   . Hyperglycemia   . Hyperlipidemia    NMR Lipoprofile 2005; LDL 146 (2007/1330), HDL 38, TG 169.  LDL goal = <100; Framingham Study LDL goal =<130  . Hypertension   . OSA on CPAP   . Tubular adenoma of colon 01/19/08    Past Surgical History:  Procedure Laterality Date  . COLONOSCOPY  2014   neg; [facility]  . COLONOSCOPY W/ POLYPECTOMY  2004 & 2009   Tics; [facility]  . KNEE ARTHROSCOPY     [name], left knee  . TONSILLECTOMY    . WISDOM TOOTH  EXTRACTION      Social History   Socioeconomic History  . Marital status: Married    Spouse name: Not on file  . Number of children: 2  . Years of education: 39  . Highest education level: Not on file  Occupational History  . Occupation: Pharmacist, hospital // Retired    Fish farm manager: [employer]  Tobacco Use  . Smoking status: Never Smoker  . Smokeless tobacco: Never Used  Vaping Use  . Vaping Use: Never used  Substance and Sexual Activity  . Alcohol use: Yes    Alcohol/week: 0.0 standard drinks    Comment: socially, 3-5  beers/week  . Drug use: No  . Sexual activity: Yes  Other Topics Concern  . Not on file  Social History Narrative   Fun: Travel, read, movies, concerts    Denies abuse and feels safe at home.    Social Determinants of Health   Financial Resource Strain: Not on file  Food Insecurity: Not on file  Transportation Needs: Not on file  Physical Activity: Not on file  Stress: Not on file  Social Connections: Not on file    Family History  Problem Relation Age of Onset  . Diverticulosis Mother   . Stroke Mother        late 76s  . Parkinsonism Mother   . Diabetes Father   . Hypertension Father   . Heart disease Father 29       CABG  . Cancer Brother        lymphoma  . Appendicitis Brother        ruptured  . Asthma Brother        childhood  . Stroke Paternal Grandmother 34  . Heart attack Maternal Grandmother 80       questionable  . Heart attack Maternal Grandfather 57  . Parkinsonism Maternal Grandfather   . Hypertension Sister   . Appendicitis Sister        ruptured  . Colon cancer Neg Hx   . Esophageal cancer Neg Hx   . Rectal cancer Neg Hx   . Stomach cancer Neg Hx     Review of Systems  Constitutional: Negative for chills.  Respiratory: Negative for cough, shortness of breath and wheezing.   Cardiovascular: Negative for chest pain, palpitations and leg swelling.  Neurological: Negative for dizziness, light-headedness and headaches.       Objective:   Vitals:   04/20/20 1000  BP: 130/80  Pulse: 67  Temp: 98 F (36.7 C)  SpO2: 98%   BP Readings from Last 3 Encounters:  04/20/20 130/80  03/07/20 140/78  03/02/20 122/80   Wt Readings from Last 3 Encounters:  04/20/20 239 lb (108.4 kg)  03/07/20 241 lb 12.8 oz (109.7 kg)  03/02/20 239 lb 9.6 oz (108.7 kg)   Body mass index is 35.29 kg/m.   Physical Exam    Constitutional: Appears well-developed and well-nourished. No distress.  HENT:  Head: Normocephalic and atraumatic.  Neck: Neck supple.  No tracheal deviation present. No thyromegaly present.  No cervical lymphadenopathy Cardiovascular: Normal rate, regular rhythm and normal heart sounds.   No murmur heard. No carotid bruit .  No edema Pulmonary/Chest: Effort normal and breath sounds normal. No respiratory distress. No has no wheezes. No rales.  Skin: Skin is warm and dry. Not diaphoretic.  Psychiatric: Normal mood and affect. Behavior is normal.      Assessment & Plan:    See Problem List for Assessment and  Plan of chronic medical problems.    This visit occurred during the SARS-CoV-2 public health emergency.  Safety protocols were in place, including screening questions prior to the visit, additional usage of staff PPE, and extensive cleaning of exam room while observing appropriate contact time as indicated for disinfecting solutions.

## 2020-04-20 ENCOUNTER — Other Ambulatory Visit: Payer: Self-pay

## 2020-04-20 ENCOUNTER — Encounter: Payer: Self-pay | Admitting: Internal Medicine

## 2020-04-20 ENCOUNTER — Ambulatory Visit (INDEPENDENT_AMBULATORY_CARE_PROVIDER_SITE_OTHER): Payer: Medicare HMO | Admitting: Internal Medicine

## 2020-04-20 VITALS — BP 130/80 | HR 67 | Temp 98.0°F | Ht 69.0 in | Wt 239.0 lb

## 2020-04-20 DIAGNOSIS — E782 Mixed hyperlipidemia: Secondary | ICD-10-CM

## 2020-04-20 DIAGNOSIS — I2510 Atherosclerotic heart disease of native coronary artery without angina pectoris: Secondary | ICD-10-CM | POA: Diagnosis not present

## 2020-04-20 DIAGNOSIS — R7303 Prediabetes: Secondary | ICD-10-CM | POA: Diagnosis not present

## 2020-04-20 DIAGNOSIS — E038 Other specified hypothyroidism: Secondary | ICD-10-CM | POA: Diagnosis not present

## 2020-04-20 DIAGNOSIS — Z23 Encounter for immunization: Secondary | ICD-10-CM

## 2020-04-20 DIAGNOSIS — I10 Essential (primary) hypertension: Secondary | ICD-10-CM

## 2020-04-20 NOTE — Assessment & Plan Note (Signed)
Chronic Blood pressure well controlled Continue metoprolol XL 50 mg daily CMP

## 2020-04-20 NOTE — Assessment & Plan Note (Signed)
Chronic Check A1c Continue regular exercise and diabetic diet work on weight loss

## 2020-04-20 NOTE — Assessment & Plan Note (Signed)
Chronic Following with cardiology Currently on metoprolol, atorvastatin and aspirin We will check CMP, lipids

## 2020-04-20 NOTE — Assessment & Plan Note (Signed)
Chronic History of subclinical hypothyroidism Check TSH Clinically euthyroid

## 2020-04-20 NOTE — Assessment & Plan Note (Signed)
Chronic Check lipid panel  Continue atorvastatin 10 mg daily Regular exercise and healthy diet encouraged  

## 2020-04-21 DIAGNOSIS — E1137X2 Type 2 diabetes mellitus with diabetic macular edema, resolved following treatment, left eye: Secondary | ICD-10-CM | POA: Diagnosis not present

## 2020-04-21 DIAGNOSIS — H3581 Retinal edema: Secondary | ICD-10-CM | POA: Diagnosis not present

## 2020-04-21 DIAGNOSIS — H35372 Puckering of macula, left eye: Secondary | ICD-10-CM | POA: Diagnosis not present

## 2020-04-28 ENCOUNTER — Other Ambulatory Visit (INDEPENDENT_AMBULATORY_CARE_PROVIDER_SITE_OTHER): Payer: Medicare HMO

## 2020-04-28 DIAGNOSIS — R7303 Prediabetes: Secondary | ICD-10-CM | POA: Diagnosis not present

## 2020-04-28 DIAGNOSIS — I2510 Atherosclerotic heart disease of native coronary artery without angina pectoris: Secondary | ICD-10-CM

## 2020-04-28 DIAGNOSIS — E782 Mixed hyperlipidemia: Secondary | ICD-10-CM | POA: Diagnosis not present

## 2020-04-28 DIAGNOSIS — E038 Other specified hypothyroidism: Secondary | ICD-10-CM

## 2020-04-28 DIAGNOSIS — I10 Essential (primary) hypertension: Secondary | ICD-10-CM

## 2020-04-28 LAB — LIPID PANEL
Cholesterol: 159 mg/dL (ref 0–200)
HDL: 42.9 mg/dL (ref >39.00)
LDL Cholesterol: 84 mg/dL (ref 0–99)
NonHDL: 116.35
Total CHOL/HDL Ratio: 4
Triglycerides: 164 mg/dL — ABNORMAL HIGH (ref 0.0–149.0)
VLDL: 32.8 mg/dL (ref 0.0–40.0)

## 2020-04-28 LAB — COMPREHENSIVE METABOLIC PANEL
ALT: 25 U/L (ref 0–53)
AST: 19 U/L (ref 0–37)
Albumin: 4.3 g/dL (ref 3.5–5.2)
Alkaline Phosphatase: 30 U/L — ABNORMAL LOW (ref 39–117)
BUN: 22 mg/dL (ref 6–23)
CO2: 27 mEq/L (ref 19–32)
Calcium: 9.6 mg/dL (ref 8.4–10.5)
Chloride: 104 mEq/L (ref 96–112)
Creatinine, Ser: 1.07 mg/dL (ref 0.40–1.50)
GFR: 69.4 mL/min (ref >60.00)
Glucose, Bld: 137 mg/dL — ABNORMAL HIGH (ref 70–99)
Potassium: 4.1 mEq/L (ref 3.5–5.1)
Sodium: 140 mEq/L (ref 135–145)
Total Bilirubin: 1 mg/dL (ref 0.2–1.2)
Total Protein: 7.4 g/dL (ref 6.0–8.3)

## 2020-04-28 LAB — HEMOGLOBIN A1C: Hgb A1c MFr Bld: 6 % (ref 4.6–6.5)

## 2020-04-28 LAB — TSH: TSH: 3.59 u[IU]/mL (ref 0.35–4.50)

## 2020-05-04 DIAGNOSIS — H35341 Macular cyst, hole, or pseudohole, right eye: Secondary | ICD-10-CM | POA: Diagnosis not present

## 2020-05-04 DIAGNOSIS — H35373 Puckering of macula, bilateral: Secondary | ICD-10-CM | POA: Diagnosis not present

## 2020-05-04 NOTE — Progress Notes (Signed)
[facility] [address] [place] [address] Phone: [phone] Subjective:   I Ronelle Nigh am serving as a Neurosurgeon for Dr. Antoine Primas.  This visit occurred during the SARS-CoV-2 public health emergency.  Safety protocols were in place, including screening questions prior to the visit, additional usage of staff PPE, and extensive cleaning of exam room while observing appropriate contact time as indicated for disinfecting solutions.   I'm seeing this patient by the request  of:  Pincus Sanes, MD  CC: Knee pain, shoulder pain  [id number]   03/19/2019 Discussed with patient in great length about icing regimen and home exercise, discussed avoiding certain activities.  Patient will increase activity slowly over the course the next several weeks.  As long as patient does well follow-up as needed  Update 05/04/2020 Edwin [name] is a 74 y.o. male coming in with complaint of left shoulder pain and right knee pain. Patient states he has had shoulder pain for about a month or 2. Certain ROM causes pain. States most of his pain is in the bicep and deltoid. Knee pain for about a month. He has decreased his walking due to allergies. When he started back the pain increased. Medial knee pain. Has tried ice for the knee. Shoulder 5-6/10 at its worse. Knee 4/10.  Patient states that he noticed the knee more with walking or sitting for long amount of time.  Shoulder just with certain movements.  States that it is a sharp pain that only lasts seconds and then seems to get better.  Not as severe as the contralateral side but somewhat similar.       Past Medical History:  Diagnosis Date  . BPH (benign prostatic hypertrophy)   . Cancer (HCC)    Basal cell of face, Dr. Karlyn Agee  . Diverticulosis   . Gilbert syndrome   . Hyperglycemia   . Hyperlipidemia    NMR Lipoprofile 2005; LDL 146 (2007/1330), HDL 38, TG 169.  LDL goal = <100; Framingham Study LDL goal  =<130  . Hypertension   . OSA on CPAP   . Tubular adenoma of colon 01/19/08   Past Surgical History:  Procedure Laterality Date  . COLONOSCOPY  2014   neg; [facility]  . COLONOSCOPY W/ POLYPECTOMY  2004 & 2009   Tics; [facility]  . KNEE ARTHROSCOPY     [name], left knee  . TONSILLECTOMY    . WISDOM TOOTH EXTRACTION     Social History   Socioeconomic History  . Marital status: Married    Spouse name: Not on file  . Number of children: 2  . Years of education: 31  . Highest education level: Not on file  Occupational History  . Occupation: Gaffer // Retired    Associate Professor: Technical sales engineer [employer]  Tobacco Use  . Smoking status: Never Smoker  . Smokeless tobacco: Never Used  Vaping Use  . Vaping Use: Never used  Substance and Sexual Activity  . Alcohol use: Yes    Alcohol/week: 0.0 standard drinks    Comment: socially, 3-5 beers/week  . Drug use: No  . Sexual activity: Yes  Other Topics Concern  . Not on file  Social History Narrative   Fun: Travel, read, movies, concerts    Denies abuse and feels safe at home.    Social Determinants of Health   Financial Resource Strain: Not on file  Food Insecurity: Not on file  Transportation Needs: Not  on file  Physical Activity: Not on file  Stress: Not on file  Social Connections: Not on file   No Known Allergies Family History  Problem Relation Age of Onset  . Diverticulosis Mother   . Stroke Mother        late 39s  . Parkinsonism Mother   . Diabetes Father   . Hypertension Father   . Heart disease Father 75       CABG  . Cancer Brother        lymphoma  . Appendicitis Brother        ruptured  . Asthma Brother        childhood  . Stroke Paternal Grandmother 23  . Heart attack Maternal Grandmother 80       questionable  . Heart attack Maternal Grandfather 57  . Parkinsonism Maternal Grandfather   . Hypertension Sister   . Appendicitis Sister        ruptured  . Colon cancer Neg Hx   .  Esophageal cancer Neg Hx   . Rectal cancer Neg Hx   . Stomach cancer Neg Hx      Current Outpatient Medications (Cardiovascular):  .  atorvastatin (LIPITOR) 10 MG tablet, TAKE ONE TABLET EVERY OTHER DAY. Please make yearly appt with Dr. [name] Cancel for September for future refills. 1st attempt .  metoprolol succinate (TOPROL-XL) 50 MG 24 hr tablet, TAKE 1 TABLET ONCE DAILY. TAKE WITH OR IMMEDIATELY FOLLOWING A MEAL. .  nitroGLYCERIN (NITROSTAT) 0.4 MG SL tablet, Place 1 tablet (0.4 mg total) under the tongue every 5 (five) minutes as needed for chest pain.   Current Outpatient Medications (Analgesics):  .  aspirin 81 MG tablet, Take 81 mg by mouth daily. .  naproxen sodium (ALEVE) 220 MG tablet, Take 220 mg by mouth daily as needed.  Current Outpatient Medications (Hematological):  [name]  Cyanocobalamin (VITAMIN B-12 PO), Take by mouth as directed.  Current Outpatient Medications (Other):  [name]  Cholecalciferol (VITAMIN D3) 1000 UNITS CAPS, Take 1 capsule by mouth daily. .  Multiple Vitamin (MULTIVITAMIN) tablet, Take 1 tablet by mouth daily. .  Turmeric (QC TUMERIC COMPLEX PO), Take by mouth daily.   Reviewed prior external information including notes and imaging from  primary care provider As well as notes that were available from care everywhere and other healthcare systems.  Past medical history, social, surgical and family history all reviewed in electronic medical record.  No pertanent information unless stated regarding to the chief complaint.   Review of Systems:  No headache, visual changes, nausea, vomiting, diarrhea, constipation, dizziness, abdominal pain, skin rash, fevers, chills, night sweats, weight loss, swollen lymph nodes, body aches, joint swelling, chest pain, shortness of breath, mood changes. POSITIVE muscle aches  Objective  Blood pressure (!) 152/90, pulse 68, height 5\' 9"  (1.753 m), weight 241 lb (109.3 kg), SpO2 96 %.   General: No apparent distress alert and  oriented x3 mood and affect normal, dressed appropriately.  HEENT: Pupils equal, extraocular movements intact patient does have redness of the left eye noted from recent surgery Respiratory: Patient's speak in full sentences and does not appear short of breath  Cardiovascular: No lower extremity edema, non tender, no erythema   Gait fairly unremarkable Right knee exam shows the patient does have mild tenderness over the medial joint line but more tenderness over the pedis anserine area.  Tightness noted with the straight leg test secondary to hamstring.  Patient has mild lateral tracking of the patella but no  pain over the patella itself.  No significant instability but some mild pain still over the MCL.  Left shoulder exam positive crossover.  5 out of 5 strength of the rotator cuff.  Tenderness over the acromioclavicular joint.  Neck exam unremarkable with negative Spurling's mild lack of extension  Limited musculoskeletal ultrasound was performed and interpreted by Judi Saa  Limited ultrasound of patient's left shoulder shows that patient does have acromioclavicular arthritis moderate to severe with effusion noted.  Rotator cuff appears to be intact with supraspinatus.  Biceps tendon appears to be unremarkable. Impression: Acromioclavicular arthritis    Impression and Recommendations:     The above documentation has been reviewed and is accurate and complete Judi Saa, DO

## 2020-05-05 ENCOUNTER — Ambulatory Visit: Payer: Medicare HMO | Admitting: Family Medicine

## 2020-05-05 ENCOUNTER — Ambulatory Visit: Payer: Self-pay

## 2020-05-05 ENCOUNTER — Encounter: Payer: Self-pay | Admitting: Family Medicine

## 2020-05-05 ENCOUNTER — Other Ambulatory Visit: Payer: Self-pay

## 2020-05-05 VITALS — BP 152/90 | HR 68 | Ht 69.0 in | Wt 241.0 lb

## 2020-05-05 DIAGNOSIS — M25512 Pain in left shoulder: Secondary | ICD-10-CM | POA: Diagnosis not present

## 2020-05-05 DIAGNOSIS — G8929 Other chronic pain: Secondary | ICD-10-CM | POA: Diagnosis not present

## 2020-05-05 DIAGNOSIS — M7050 Other bursitis of knee, unspecified knee: Secondary | ICD-10-CM | POA: Diagnosis not present

## 2020-05-05 NOTE — Patient Instructions (Addendum)
Good to see you Thigh compression sleeve  Voltaren gel and Ice 20 mins 2 times a day Shorten stride length AC Joint arthritis Voltaren and ice Keep hands within peripheral vision See me again in 4 weeks we will inject [facility] joint

## 2020-05-05 NOTE — Assessment & Plan Note (Signed)
Patient has pain on the contralateral side which is the left side now.  Patient shoulder otherwise appears to be unremarkable.  Home exercises given, topical anti-inflammatories.  Recently did have retinal surgery 2 weeks ago and would like to avoid the steroid injection at this time but in 4-week follow-up will consider injection.

## 2020-05-05 NOTE — Assessment & Plan Note (Signed)
Patient does have more of a pes anserine bursitis.  Discussed icing regimen and home exercises.  Discussed which activities to do which wants to avoid.  Patient will do a thigh compression sleeve, topical anti-inflammatories and will change gait.  Follow-up with me again in 4 to 6 weeks worsening symptoms will consider looking at the knee more again.

## 2020-05-25 DIAGNOSIS — H35371 Puckering of macula, right eye: Secondary | ICD-10-CM | POA: Diagnosis not present

## 2020-05-26 DIAGNOSIS — Z85828 Personal history of other malignant neoplasm of skin: Secondary | ICD-10-CM | POA: Diagnosis not present

## 2020-05-26 DIAGNOSIS — D485 Neoplasm of uncertain behavior of skin: Secondary | ICD-10-CM | POA: Diagnosis not present

## 2020-05-26 DIAGNOSIS — L988 Other specified disorders of the skin and subcutaneous tissue: Secondary | ICD-10-CM | POA: Diagnosis not present

## 2020-05-30 NOTE — Progress Notes (Signed)
[facility] [address] [place] Phone: [phone] Subjective:   I Edwin [name] am serving as a Education administrator for [name].  This visit occurred during the SARS-CoV-2 public health emergency.  Safety protocols were in place, including screening questions prior to the visit, additional usage of staff PPE, and extensive cleaning of exam room while observing appropriate contact time as indicated for disinfecting solutions.   I'm seeing this patient by the request  of:  [name], MD  CC: Shoulder pain follow-up  [id number]   05/05/2020 Patient has pain on the contralateral side which is the left side now.  Patient shoulder otherwise appears to be unremarkable.  Home exercises given, topical anti-inflammatories.  Recently did have retinal surgery 2 weeks ago and would like to avoid the steroid injection at this time but in 4-week follow-up will consider injection.  Update 05/31/2020 Edwin [name] is a 75 y.o. male coming in with complaint of left AC joint arthritis and right pes anserine bursitis. Knee pain comes and goes. Shoulder pain is consistent everyday. Making no progress.  Patient states that it seems to be more radiating down the arm.  States that it is only on the upper arm.  No radiation past the elbow pain states that certain movements such as reaching behind his back causes more discomfort and pain.       Past Medical History:  Diagnosis Date  . BPH (benign prostatic hypertrophy)   . Cancer ([place])    Basal cell of face, [name]  . Diverticulosis   . Gilbert syndrome   . Hyperglycemia   . Hyperlipidemia    NMR Lipoprofile 2005; LDL 146 (2007/1330), HDL 38, TG 169.  LDL goal = <100; Framingham Study LDL goal =<130  . Hypertension   . OSA on CPAP   . Tubular adenoma of colon 01/19/08   Past Surgical History:  Procedure Laterality Date  . COLONOSCOPY  2014   neg; [facility]  . COLONOSCOPY W/ POLYPECTOMY   2004 & 2009   Tics; [facility]  . KNEE ARTHROSCOPY     [name], left knee  . TONSILLECTOMY    . WISDOM TOOTH EXTRACTION     Social History   Socioeconomic History  . Marital status: Married    Spouse name: Not on file  . Number of children: 2  . Years of education: 53  . Highest education level: Not on file  Occupational History  . Occupation: Pharmacist, hospital // Retired    Fish farm manager: [employer]  Tobacco Use  . Smoking status: Never Smoker  . Smokeless tobacco: Never Used  Vaping Use  . Vaping Use: Never used  Substance and Sexual Activity  . Alcohol use: Yes    Alcohol/week: 0.0 standard drinks    Comment: socially, 3-5 beers/week  . Drug use: No  . Sexual activity: Yes  Other Topics Concern  . Not on file  Social History Narrative   Fun: Travel, read, movies, concerts    Denies abuse and feels safe at home.    Social Determinants of Health   Financial Resource Strain: Not on file  Food Insecurity: Not on file  Transportation Needs: Not on file  Physical Activity: Not on file  Stress: Not on file  Social Connections: Not on file   No Known Allergies Family History  Problem Relation Age of Onset  . Diverticulosis Mother   . Stroke Mother  late 63s  . Parkinsonism Mother   . Diabetes Father   . Hypertension Father   . Heart disease Father 41       CABG  . Cancer Brother        lymphoma  . Appendicitis Brother        ruptured  . Asthma Brother        childhood  . Stroke Paternal Grandmother 47  . Heart attack Maternal Grandmother 80       questionable  . Heart attack Maternal Grandfather 57  . Parkinsonism Maternal Grandfather   . Hypertension Sister   . Appendicitis Sister        ruptured  . Colon cancer Neg Hx   . Esophageal cancer Neg Hx   . Rectal cancer Neg Hx   . Stomach cancer Neg Hx      Current Outpatient Medications (Cardiovascular):  .  atorvastatin (LIPITOR) 10 MG tablet, TAKE ONE TABLET EVERY OTHER  DAY. Please make yearly appt with Dr. [name] Cancel for September for future refills. 1st attempt .  metoprolol succinate (TOPROL-XL) 50 MG 24 hr tablet, TAKE 1 TABLET ONCE DAILY. TAKE WITH OR IMMEDIATELY FOLLOWING A MEAL. .  nitroGLYCERIN (NITROSTAT) 0.4 MG SL tablet, Place 1 tablet (0.4 mg total) under the tongue every 5 (five) minutes as needed for chest pain.   Current Outpatient Medications (Analgesics):  .  aspirin 81 MG tablet, Take 81 mg by mouth daily. .  naproxen sodium (ALEVE) 220 MG tablet, Take 220 mg by mouth daily as needed.  Current Outpatient Medications (Hematological):  [name]  Cyanocobalamin (VITAMIN B-12 PO), Take by mouth as directed.  Current Outpatient Medications (Other):  [name]  Cholecalciferol (VITAMIN D3) 1000 UNITS CAPS, Take 1 capsule by mouth daily. .  Multiple Vitamin (MULTIVITAMIN) tablet, Take 1 tablet by mouth daily. .  Turmeric (QC TUMERIC COMPLEX PO), Take by mouth daily.   Reviewed prior external information including notes and imaging from  primary care provider As well as notes that were available from care everywhere and other healthcare systems.  Past medical history, social, surgical and family history all reviewed in electronic medical record.  No pertanent information unless stated regarding to the chief complaint.   Review of Systems:  No headache, visual changes, nausea, vomiting, diarrhea, constipation, dizziness, abdominal pain, skin rash, fevers, chills, night sweats, weight loss, swollen lymph nodes, body aches, joint swelling, chest pain, shortness of breath, mood changes. POSITIVE muscle aches  Objective  Blood pressure (!) 148/84, pulse 62, height 5\' 9"  (1.753 m), weight 239 lb (108.4 kg), SpO2 98 %.   General: No apparent distress alert and oriented x3 mood and affect normal, dressed appropriately.  HEENT: Pupils equal, extraocular movements intact  Respiratory: Patient's speak in full sentences and does not appear short of breath   Cardiovascular: No lower extremity edema, non tender, no erythema  Gait normal with good balance and coordination.  MSK:  Shoulder: left Inspection reveals no abnormalities, atrophy or asymmetry. Palpation is normal with no tenderness over AC joint or bicipital groove. ROM is full in all planes passively. Rotator cuff strength appears to be intact the patient does have pain with empty can signs of impingement with positive Neer and Hawkin's tests, but negative empty can sign. Speeds and Yergason's tests normal. No labral pathology noted with negative Obrien's, negative clunk and good stability. Normal scapular function observed. No painful arc and no drop arm sign. No apprehension sign  MSK US performed of: left This study was  ordered, performed, and interpreted by Charlann Boxer D.O.  Shoulder:   Supraspinatus: Some mild degenerative changes noted.. Infraspinatus:  Appears normal on long and transverse views. Significant increase in Doppler flow Subscapularis: Degenerative changes noted as well. AC joint: Arthritic changes but no effusion which is an improvement Glenohumeral Joint:  Appears normal without effusion. Biceps Tendon:  Appears normal on long and transverse views, no fraying of tendon, tendon located in intertubercular groove, no subluxation with shoulder internal or external rotation.  Impression: Mild degenerative changes of the rotator cuff.  Moderate arthritic changes of the acromioclavicular joint  Procedure: Real-time Ultrasound Guided Injection of left glenohumeral joint Device: GE Logiq E  Ultrasound guided injection is preferred based studies that show increased duration, increased effect, greater accuracy, decreased procedural pain, increased response rate with ultrasound guided versus blind injection.  Verbal informed consent obtained.  Time-out conducted.  Noted no overlying erythema, induration, or other signs of local infection.  Skin prepped in a sterile  fashion.  Local anesthesia: Topical Ethyl chloride.  With sterile technique and under real time ultrasound guidance:  Joint visualized.  23g 1  inch needle inserted posterior approach. Pictures taken for needle placement. Patient did have injection of 2 cc of 1% lidocaine, 2 cc of 0.5% Marcaine, and 1.0 cc of Kenalog 40 mg/dL. Completed without difficulty  Pain immediately resolved suggesting accurate placement of the medication.  Advised to call if fevers/chills, erythema, induration, drainage, or persistent bleeding.  Impression: Technically successful ultrasound guided injection.    Impression and Recommendations:     The above documentation has been reviewed and is accurate and complete Lyndal Pulley, DO

## 2020-05-31 ENCOUNTER — Ambulatory Visit: Payer: Medicare HMO | Admitting: Family Medicine

## 2020-05-31 ENCOUNTER — Other Ambulatory Visit: Payer: Self-pay

## 2020-05-31 ENCOUNTER — Ambulatory Visit: Payer: Self-pay

## 2020-05-31 ENCOUNTER — Encounter: Payer: Self-pay | Admitting: Family Medicine

## 2020-05-31 VITALS — BP 148/84 | HR 62 | Ht 69.0 in | Wt 239.0 lb

## 2020-05-31 DIAGNOSIS — G8929 Other chronic pain: Secondary | ICD-10-CM | POA: Diagnosis not present

## 2020-05-31 DIAGNOSIS — M25512 Pain in left shoulder: Secondary | ICD-10-CM

## 2020-05-31 NOTE — Patient Instructions (Signed)
Good to see you Continue exercises Injected shoulder  If no better MRI or PT See me again in 5-6 weeks

## 2020-05-31 NOTE — Assessment & Plan Note (Signed)
Patient given injection.  Tolerated very well.  Patient has had more of acromioclavicular arthritis previously.  Patient though on ultrasound today did not have any significant findings that are consistent with that.  Having more impingement.  Questionable some scapularis degenerative tearing noted on ultrasound and we will need to monitor.  Patient given injection in the tolerated the procedure well.  Patient declined formal physical therapy.  Worsening pain will consider physical therapy or MRI at follow-up follow-up again in 5 to 6 weeks

## 2020-06-02 ENCOUNTER — Other Ambulatory Visit: Payer: Self-pay | Admitting: Cardiovascular Disease

## 2020-06-02 DIAGNOSIS — E7849 Other hyperlipidemia: Secondary | ICD-10-CM

## 2020-06-02 DIAGNOSIS — R931 Abnormal findings on diagnostic imaging of heart and coronary circulation: Secondary | ICD-10-CM

## 2020-06-10 DIAGNOSIS — G4733 Obstructive sleep apnea (adult) (pediatric): Secondary | ICD-10-CM | POA: Diagnosis not present

## 2020-06-13 ENCOUNTER — Telehealth: Payer: Self-pay | Admitting: Family Medicine

## 2020-06-13 ENCOUNTER — Ambulatory Visit (HOSPITAL_COMMUNITY)
Admission: RE | Admit: 2020-06-13 | Discharge: 2020-06-13 | Disposition: A | Discharge status: Home / Self Care | Payer: Medicare HMO | Source: Ambulatory Visit | Attending: Family Medicine | Admitting: Family Medicine

## 2020-06-13 ENCOUNTER — Other Ambulatory Visit: Payer: Self-pay

## 2020-06-13 ENCOUNTER — Ambulatory Visit (INDEPENDENT_AMBULATORY_CARE_PROVIDER_SITE_OTHER): Payer: Medicare HMO

## 2020-06-13 ENCOUNTER — Ambulatory Visit: Payer: Self-pay

## 2020-06-13 ENCOUNTER — Encounter: Payer: Self-pay | Admitting: Family Medicine

## 2020-06-13 ENCOUNTER — Ambulatory Visit: Payer: Medicare HMO | Admitting: Family Medicine

## 2020-06-13 VITALS — BP 110/80 | HR 56 | Ht 69.0 in | Wt 239.0 lb

## 2020-06-13 DIAGNOSIS — M25561 Pain in right knee: Secondary | ICD-10-CM | POA: Diagnosis not present

## 2020-06-13 DIAGNOSIS — M79661 Pain in right lower leg: Secondary | ICD-10-CM

## 2020-06-13 DIAGNOSIS — M25512 Pain in left shoulder: Secondary | ICD-10-CM | POA: Diagnosis not present

## 2020-06-13 DIAGNOSIS — G8929 Other chronic pain: Secondary | ICD-10-CM

## 2020-06-13 DIAGNOSIS — M1711 Unilateral primary osteoarthritis, right knee: Secondary | ICD-10-CM

## 2020-06-13 IMAGING — DX DG KNEE COMPLETE 4+V*R*
4 series · 4 of 4 positions shown · non-contrast
Comparison: [DATE]

CLINICAL DATA: Right knee pain

EXAM:
RIGHT KNEE - COMPLETE 4+ VIEW

[knee ap]
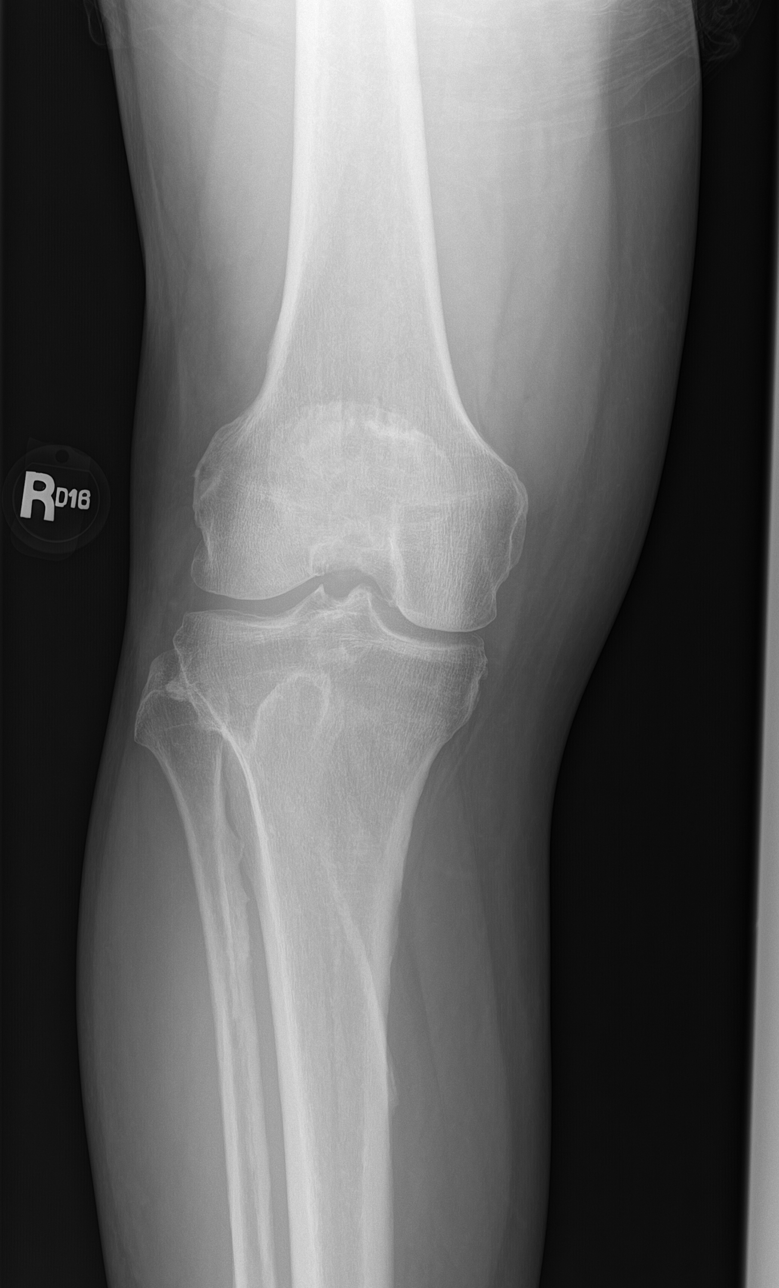

[knee obl (1 of 2)]
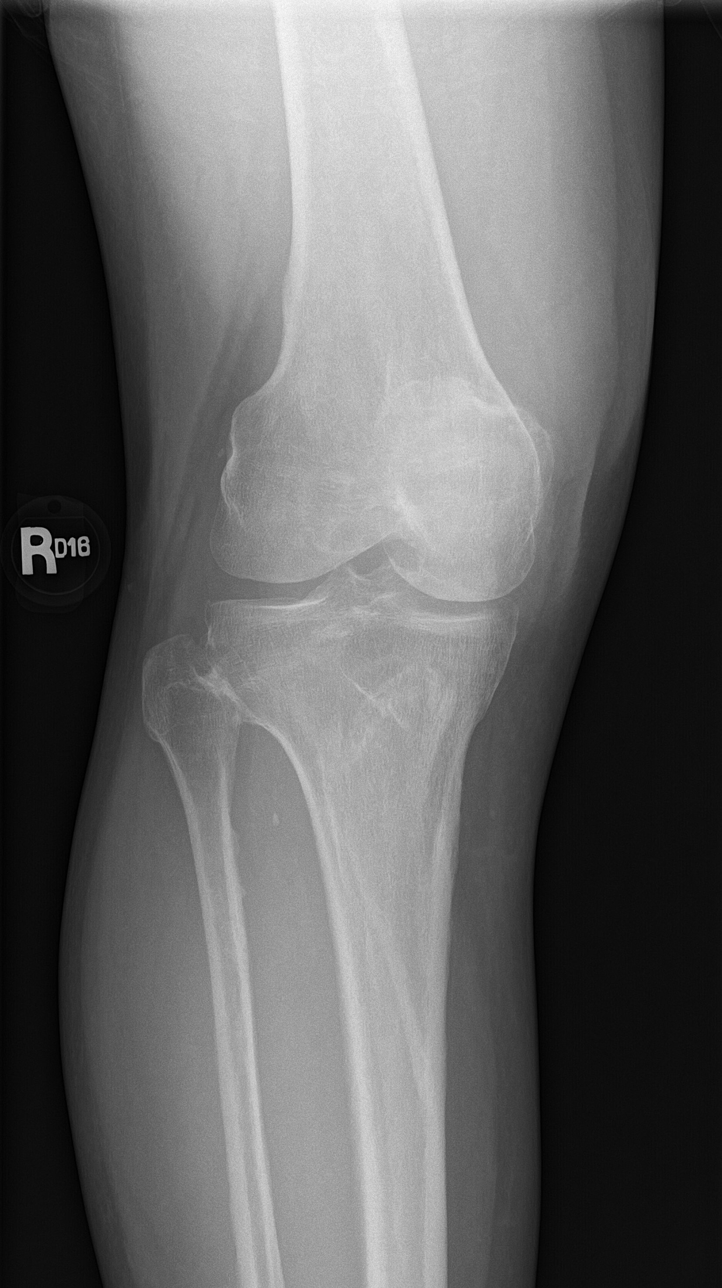

[knee obl (2 of 2)]
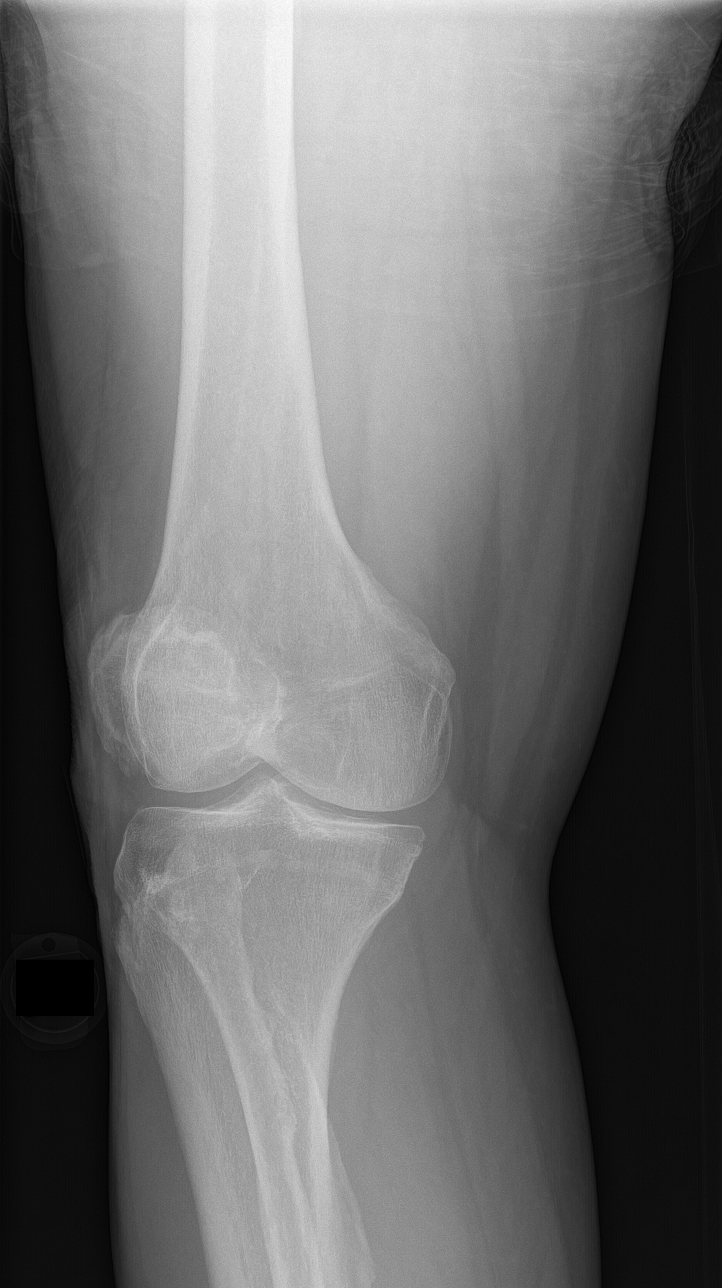

[knee lat]
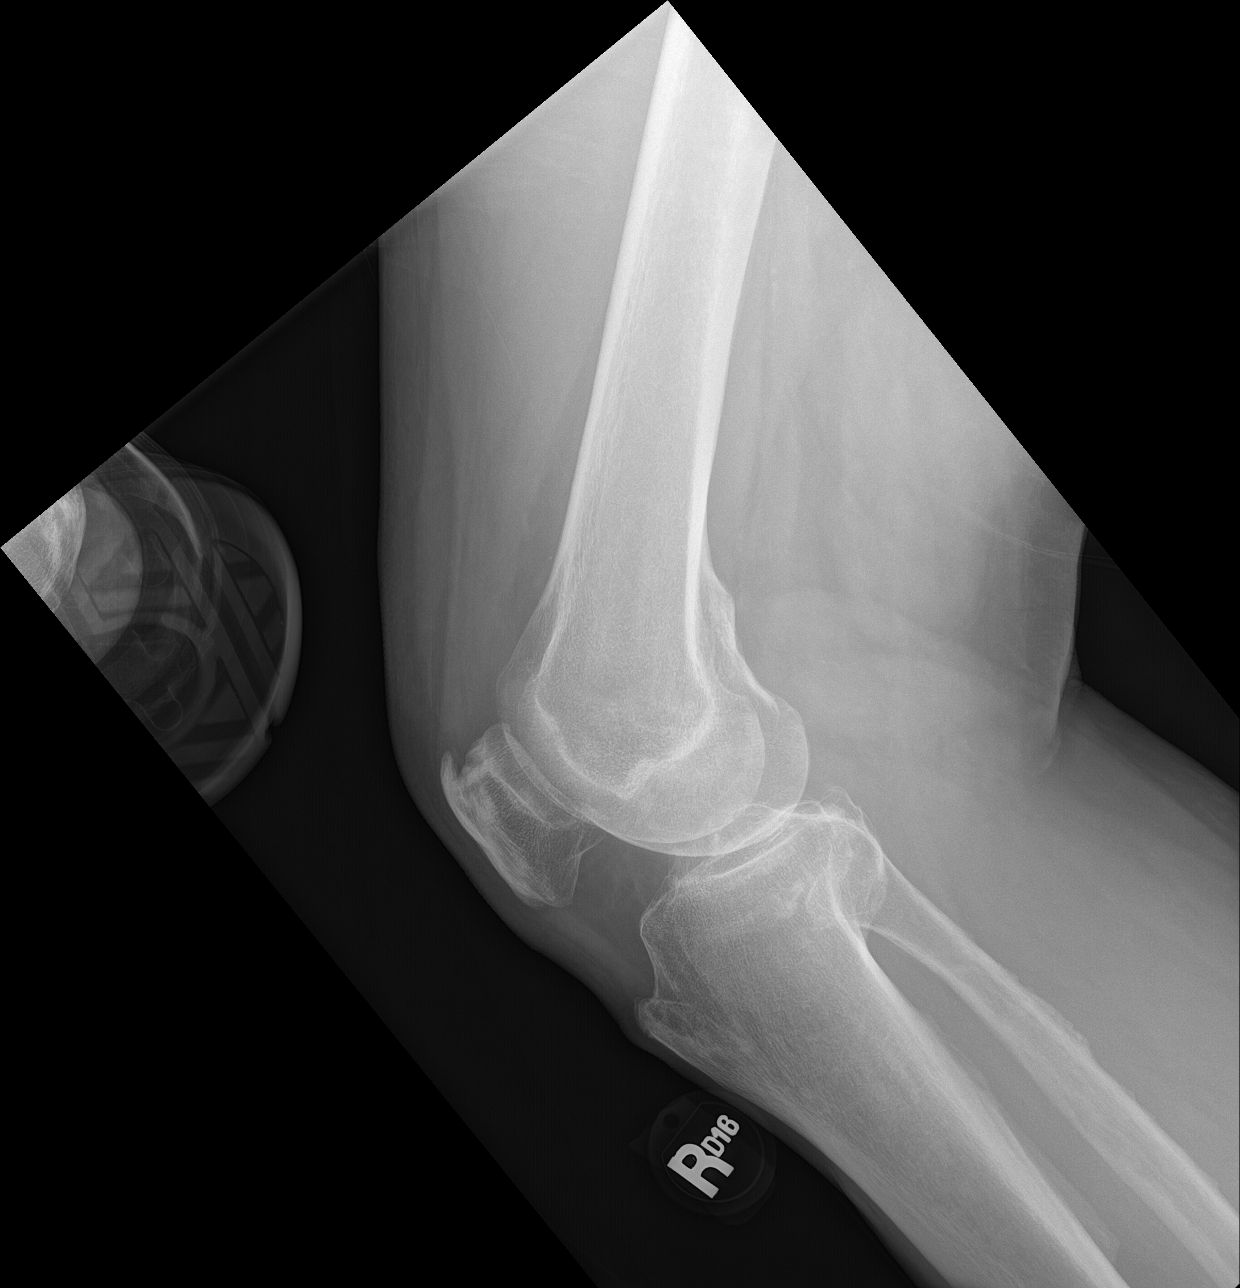

[4 of 4 positions shown; findings below may reference images not displayed]

FINDINGS: Four view radiograph right knee demonstrates normal alignment. No
acute fracture or dislocation. There is mild tricompartmental
degenerative arthritis, most severe within the medial compartment,
stable since prior examination. Ankylosis of the proximal tibia
fibular articulation is noted. No effusion. Mild degenerative
enthesopathy involving the insertion of the quadriceps tendon upon
the patella, unchanged. Soft tissues are otherwise unremarkable.
IMPRESSION: No acute fracture or dislocation. Stable mild tricompartmental
degenerative arthritis.

## 2020-06-13 NOTE — Patient Instructions (Addendum)
Good to see you  X ray on your way out   Injection today  Exercises for the knee Doppler ordered Gel approval See me as scheduled  [place].

## 2020-06-13 NOTE — Telephone Encounter (Signed)
FYI

## 2020-06-13 NOTE — Progress Notes (Signed)
[name] [place] Phone: [phone] Subjective:   Edwin [name], am serving as a scribe for [name].  This visit occurred during the SARS-CoV-2 public health emergency.  Safety protocols were in place, including screening questions prior to the visit, additional usage of staff PPE, and extensive cleaning of exam room while observing appropriate contact time as indicated for disinfecting solutions.    I'm seeing this patient by the request  of:  Edwin Rail, MD  CC: Right knee pain and calf pain  [id number]  Edwin [name] is a 75 y.o. male coming in with complaint of Right calf and knee pain. Pain started in the calf on Saturday as a severe cramp and that has intensified the pain in the R Patellofemoral.      Aggravating factors- walk Reliving factors- aleve  Therapies tried-  Ice, roller  Severity- sitting 0 Walking 5-6/10   Patient had x-rays of the right knee done in January 2019 showing the patient did have mild patellofemoral compartment arthritis but otherwise fairly unremarkable.  Past Medical History:  Diagnosis Date  . BPH (benign prostatic hypertrophy)   . Cancer (Edwin [name])    Basal cell of face, Dr. Wilhemina [name]  . Diverticulosis   . Gilbert syndrome   . Hyperglycemia   . Hyperlipidemia    NMR Lipoprofile 2005; LDL 146 (2007/1330), HDL 38, TG 169.  LDL goal = <100; Framingham Study LDL goal =<130  . Hypertension   . OSA on CPAP   . Tubular adenoma of colon 01/19/08   Past Surgical History:  Procedure Laterality Date  . COLONOSCOPY  2014   neg; [facility]  . COLONOSCOPY W/ POLYPECTOMY  2004 & 2009   Tics; [facility]  . KNEE ARTHROSCOPY     [name], left knee  . TONSILLECTOMY    . WISDOM TOOTH EXTRACTION     Social History   Socioeconomic History  . Marital status: Married    Spouse name: Not on file  . Number of children: 2  . Years of education: 21  . Highest  education level: Not on file  Occupational History  . Occupation: Pharmacist, hospital // Retired    Fish farm manager: [name]  Tobacco Use  . Smoking status: Never Smoker  . Smokeless tobacco: Never Used  Vaping Use  . Vaping Use: Never used  Substance and Sexual Activity  . Alcohol use: Yes    Alcohol/week: 0.0 standard drinks    Comment: socially, 3-5 beers/week  . Drug use: No  . Sexual activity: Yes  Other Topics Concern  . Not on file  Social History Narrative   Fun: Travel, read, movies, concerts    Denies abuse and feels safe at home.    Social Determinants of Health   Financial Resource Strain: Not on file  Food Insecurity: Not on file  Transportation Needs: Not on file  Physical Activity: Not on file  Stress: Not on file  Social Connections: Not on file   No Known Allergies Family History  Problem Relation Age of Onset  . Diverticulosis Mother   . Stroke Mother        late 82s  . Parkinsonism Mother   . Diabetes Father   . Hypertension Father   . Heart disease Father 15       CABG  . Cancer Brother        lymphoma  . Appendicitis Brother  ruptured  . Asthma Brother        childhood  . Stroke Paternal Grandmother 79  . Heart attack Maternal Grandmother 80       questionable  . Heart attack Maternal Grandfather 57  . Parkinsonism Maternal Grandfather   . Hypertension Sister   . Appendicitis Sister        ruptured  . Colon cancer Neg Hx   . Esophageal cancer Neg Hx   . Rectal cancer Neg Hx   . Stomach cancer Neg Hx      Current Outpatient Medications (Cardiovascular):  .  atorvastatin (LIPITOR) 10 MG tablet, TAKE 1 TABLET EVERY OTHER DAY. .  metoprolol succinate (TOPROL-XL) 50 MG 24 hr tablet, TAKE 1 TABLET ONCE DAILY. TAKE WITH OR IMMEDIATELY FOLLOWING A MEAL. .  nitroGLYCERIN (NITROSTAT) 0.4 MG SL tablet, Place 1 tablet (0.4 mg total) under the tongue every 5 (five) minutes as needed for chest pain.   Current Outpatient  Medications (Analgesics):  .  aspirin 81 MG tablet, Take 81 mg by mouth daily. .  naproxen sodium (ALEVE) 220 MG tablet, Take 220 mg by mouth daily as needed.  Current Outpatient Medications (Hematological):  [name]  Cyanocobalamin (VITAMIN B-12 PO), Take by mouth as directed.  Current Outpatient Medications (Other):  [name]  Cholecalciferol (VITAMIN D3) 1000 UNITS CAPS, Take 1 capsule by mouth daily. .  Multiple Vitamin (MULTIVITAMIN) tablet, Take 1 tablet by mouth daily. .  Turmeric (QC TUMERIC COMPLEX PO), Take by mouth daily.   Reviewed prior external information including notes and imaging from  primary care provider As well as notes that were available from care everywhere and other healthcare systems.  Past medical history, social, surgical and family history all reviewed in electronic medical record.  No pertanent information unless stated regarding to the chief complaint.   Review of Systems:  No headache, visual changes, nausea, vomiting, diarrhea, constipation, dizziness, abdominal pain, skin rash, fevers, chills, night sweats, weight loss, swollen lymph nodes, body aches, joint swelling, chest pain, shortness of breath, mood changes. POSITIVE muscle aches  Objective  Blood pressure 110/80, pulse (!) 56, height 5\' 9"  (1.753 m), weight 239 lb (108.4 kg), SpO2 90 %.   General: No apparent distress alert and oriented x3 mood and affect normal, dressed appropriately.  HEENT: Pupils equal, extraocular movements intact  Respiratory: Patient's speak in full sentences and does not appear short of breath  Cardiovascular: No lower extremity edema, non tender, no erythema  Gait normal with good balance and coordination.  MSK:   Knee: Right knee Normal to inspection with no erythema or effusion or obvious bony abnormalities. Palpation mild pain in the popliteal area and the medial joint line ROM full in flexion and extension and lower leg rotation. Ligaments with solid consistent endpoints  including ACL, PCL, LCL, MCL. Mild positive Mcmurray's, Apley's, and Thessalonian tests. Mild painful patellar compression. Patellar glide with moderate crepitus. Patellar and quadriceps tendons unremarkable. Hamstring and quadriceps strength is normal.  Limited musculoskeletal ultrasound was performed and interpreted by Lyndal Pulley  Limited ultrasound shows the patient does have some narrowing of the patellofemoral joint noted.  Patient does have some degenerative changes of the medial meniscus noted.  Popliteal area seems to be unremarkable. Impression: Patellofemoral arthritis  After informed written and verbal consent, patient was seated on exam table. Right knee was prepped with alcohol swab and utilizing anterolateral approach, patient's right knee space was injected with 4:1  marcaine 0.5%: Kenalog 40mg /dL. Patient tolerated the procedure  well without immediate complications.   Impression and Recommendations:     The above documentation has been reviewed and is accurate and complete Lyndal Pulley, DO

## 2020-06-13 NOTE — Assessment & Plan Note (Signed)
Reviewed exercises given, repeat x-rays ordered today.  Injection given today.  Secondary to the calf pain and the worsening symptoms I would like to get a Doppler to rule out any type of blood clot with patient also traveling in the near future.  Patient will be scheduled for that later today.  We discussed icing regimen, compression, home exercises.  Patient could be a candidate for viscosupplementation and will get approval.  If Doppler is normal we will see patient back again in 4 to 6 weeks and consider the possibility of viscosupplementation if necessary.

## 2020-06-13 NOTE — Assessment & Plan Note (Signed)
Discussed with patient again.  Patient wants to hold on physical therapy or MRI at this time but we encouraged him to consider it if this continues to give him discomfort.

## 2020-06-13 NOTE — Telephone Encounter (Signed)
[facility] on [address] called stating that the patient is negative for DVT of the right leg and has been released.

## 2020-07-06 NOTE — Progress Notes (Signed)
[place] Phone: [phone] Subjective:   Edwin [name], am serving as a scribe for [name]. This visit occurred during the SARS-CoV-2 public health emergency.  Safety protocols were in place, including screening questions prior to the visit, additional usage of staff PPE, and extensive cleaning of exam room while observing appropriate contact time as indicated for disinfecting solutions.   I'm seeing this patient by the request  of:  [name], MD  CC: Shoulder pain follow-up, right knee pain follow-up  [id number]   06/13/2020 Discussed with patient again.  Patient wants to hold on physical therapy or MRI at this time but we encouraged him to consider it if this continues to give him discomfort.  Reviewed exercises given, repeat x-rays ordered today.  Injection given today.  Secondary to the calf pain and the worsening symptoms I would like to get a Doppler to rule out any type of blood clot with patient also traveling in the near future.  Patient will be scheduled for that later today.  We discussed icing regimen, compression, home exercises.  Patient could be a candidate for viscosupplementation and will get approval.  If Doppler is normal we will see patient back again in 4 to 6 weeks and consider the possibility of viscosupplementation if necessary  Update 07/07/2020 Edwin [name] is a 75 y.o. male coming in with complaint of right knee pain and left shoulder pain. Monovisc authorized for right knee. Patient states that overall he is doing better. He does feel like he isn't putting all his weight on that side when he walks as this causes pain.   Pain in left shoulder is intermittent. Pain with reaching for seat belt or anything where extension is involved.  Patient states that overall does feel like he has made some improvement at the moment.  Feels like he is doing relatively well.    Past  Medical History:  Diagnosis Date  . BPH (benign prostatic hypertrophy)   . Cancer ([place])    Basal cell of face, [name]  . Diverticulosis   . Gilbert syndrome   . Hyperglycemia   . Hyperlipidemia    NMR Lipoprofile 2005; LDL 146 (2007/1330), HDL 38, TG 169.  LDL goal = <100; Framingham Study LDL goal =<130  . Hypertension   . OSA on CPAP   . Tubular adenoma of colon 01/19/08   Past Surgical History:  Procedure Laterality Date  . COLONOSCOPY  2014   neg; [facility]  . COLONOSCOPY W/ POLYPECTOMY  2004 & 2009   Tics; [facility]  . KNEE ARTHROSCOPY     [name], left knee  . TONSILLECTOMY    . WISDOM TOOTH EXTRACTION     Social History   Socioeconomic History  . Marital status: Married    Spouse name: Not on file  . Number of children: 2  . Years of education: 15  . Highest education level: Not on file  Occupational History  . Occupation: Pharmacist, hospital // Retired    Fish farm manager: [place]  Tobacco Use  . Smoking status: Never Smoker  . Smokeless tobacco: Never Used  Vaping Use  . Vaping Use: Never used  Substance and Sexual Activity  . Alcohol use: Yes    Alcohol/week: 0.0 standard drinks    Comment: socially, 3-5 beers/week  . Drug use: [name]  . Sexual activity: Yes  Other Topics Concern  . Not  on file  Social History Narrative   Fun: Travel, read, movies, concerts    Denies abuse and feels safe at home.    Social Determinants of Health   Financial Resource Strain: Not on file  Food Insecurity: Not on file  Transportation Needs: Not on file  Physical Activity: Not on file  Stress: Not on file  Social Connections: Not on file   [name] Known Allergies Family History  Problem Relation Age of Onset  . Diverticulosis Mother   . Stroke Mother        late 33s  . Parkinsonism Mother   . Diabetes Father   . Hypertension Father   . Heart disease Father 66       CABG  . Cancer Brother        lymphoma  . Appendicitis Brother         ruptured  . Asthma Brother        childhood  . Stroke Paternal Grandmother 22  . Heart attack Maternal Grandmother 80       questionable  . Heart attack Maternal Grandfather 57  . Parkinsonism Maternal Grandfather   . Hypertension Sister   . Appendicitis Sister        ruptured  . Colon cancer Neg Hx   . Esophageal cancer Neg Hx   . Rectal cancer Neg Hx   . Stomach cancer Neg Hx      Current Outpatient Medications (Cardiovascular):  .  atorvastatin (LIPITOR) 10 MG tablet, TAKE 1 TABLET EVERY OTHER DAY. .  metoprolol succinate (TOPROL-XL) 50 MG 24 hr tablet, TAKE 1 TABLET ONCE DAILY. TAKE WITH OR IMMEDIATELY FOLLOWING A MEAL. .  nitroGLYCERIN (NITROSTAT) 0.4 MG SL tablet, Place 1 tablet (0.4 mg total) under the tongue every 5 (five) minutes as needed for chest pain.   Current Outpatient Medications (Analgesics):  .  aspirin 81 MG tablet, Take 81 mg by mouth daily. .  naproxen sodium (ALEVE) 220 MG tablet, Take 220 mg by mouth daily as needed.  Current Outpatient Medications (Hematological):  [name]  Cyanocobalamin (VITAMIN B-12 PO), Take by mouth as directed.  Current Outpatient Medications (Other):  [name]  Cholecalciferol (VITAMIN D3) 1000 UNITS CAPS, Take 1 capsule by mouth daily. .  Multiple Vitamin (MULTIVITAMIN) tablet, Take 1 tablet by mouth daily. .  Turmeric (QC TUMERIC COMPLEX PO), Take by mouth daily.   Reviewed prior external information including notes and imaging from  primary care provider As well as notes that were available from care everywhere and other healthcare systems.  Past medical history, social, surgical and family history all reviewed in electronic medical record.  [name] pertanent information unless stated regarding to the chief complaint.   Review of Systems:  [name] headache, visual changes, nausea, vomiting, diarrhea, constipation, dizziness, abdominal pain, skin rash, fevers, chills, night sweats, weight loss, swollen lymph nodes, body aches, joint swelling,  chest pain, shortness of breath, mood changes. POSITIVE muscle aches  Objective  Blood pressure 110/72, pulse 63, height 5\' 9"  (1.753 m), weight 238 lb (108 kg), SpO2 98 %.   General: [name] apparent distress alert and oriented x3 mood and affect normal, dressed appropriately.  HEENT: Pupils equal, extraocular movements intact  Respiratory: Patient's speak in full sentences and does not appear short of breath  Cardiovascular: [name] lower extremity edema, non tender, [name] erythema  Gait normal with good balance and coordination.  MSK: Left shoulder continues to have discomfort with crossover test.  Patient does have good rotator cuff strength noted.  Patient does have some limited internal rotation.  Right knee exam does have some lateral tracking of the patella noted.  Positive patellar grind test.  Mild pain over the medial joint line.  Mild positive McMurray's noted.  After informed written and verbal consent, patient was seated on exam table. Right knee was prepped with alcohol swab and utilizing anterolateral approach, patient's right knee space was injected with 48 mg per 3 mL of Monovisc (sodium hyaluronate) in a prefilled syringe was injected easily into the knee through a 22-gauge needle..Patient tolerated the procedure well without immediate complications.    Impression and Recommendations:     The above documentation has been reviewed and is accurate and complete Lyndal Pulley, DO

## 2020-07-07 ENCOUNTER — Ambulatory Visit (INDEPENDENT_AMBULATORY_CARE_PROVIDER_SITE_OTHER): Payer: Medicare HMO

## 2020-07-07 ENCOUNTER — Ambulatory Visit: Payer: Medicare HMO | Admitting: Family Medicine

## 2020-07-07 ENCOUNTER — Other Ambulatory Visit: Payer: Self-pay

## 2020-07-07 ENCOUNTER — Encounter: Payer: Self-pay | Admitting: Family Medicine

## 2020-07-07 DIAGNOSIS — M19011 Primary osteoarthritis, right shoulder: Secondary | ICD-10-CM

## 2020-07-07 DIAGNOSIS — M25512 Pain in left shoulder: Secondary | ICD-10-CM

## 2020-07-07 DIAGNOSIS — M1711 Unilateral primary osteoarthritis, right knee: Secondary | ICD-10-CM

## 2020-07-07 DIAGNOSIS — G8929 Other chronic pain: Secondary | ICD-10-CM | POA: Diagnosis not present

## 2020-07-07 DIAGNOSIS — M25812 Other specified joint disorders, left shoulder: Secondary | ICD-10-CM | POA: Diagnosis not present

## 2020-07-07 IMAGING — DX DG SHOULDER 2+V*L*
3 series · 3 of 3 positions shown · non-contrast
Comparison: Chest radiograph [DATE].

CLINICAL DATA: 74-year-old male with left shoulder pain for 3
months. No known injury.

EXAM:
LEFT SHOULDER - 2+ VIEW

[shoulder ap (1 of 2)]
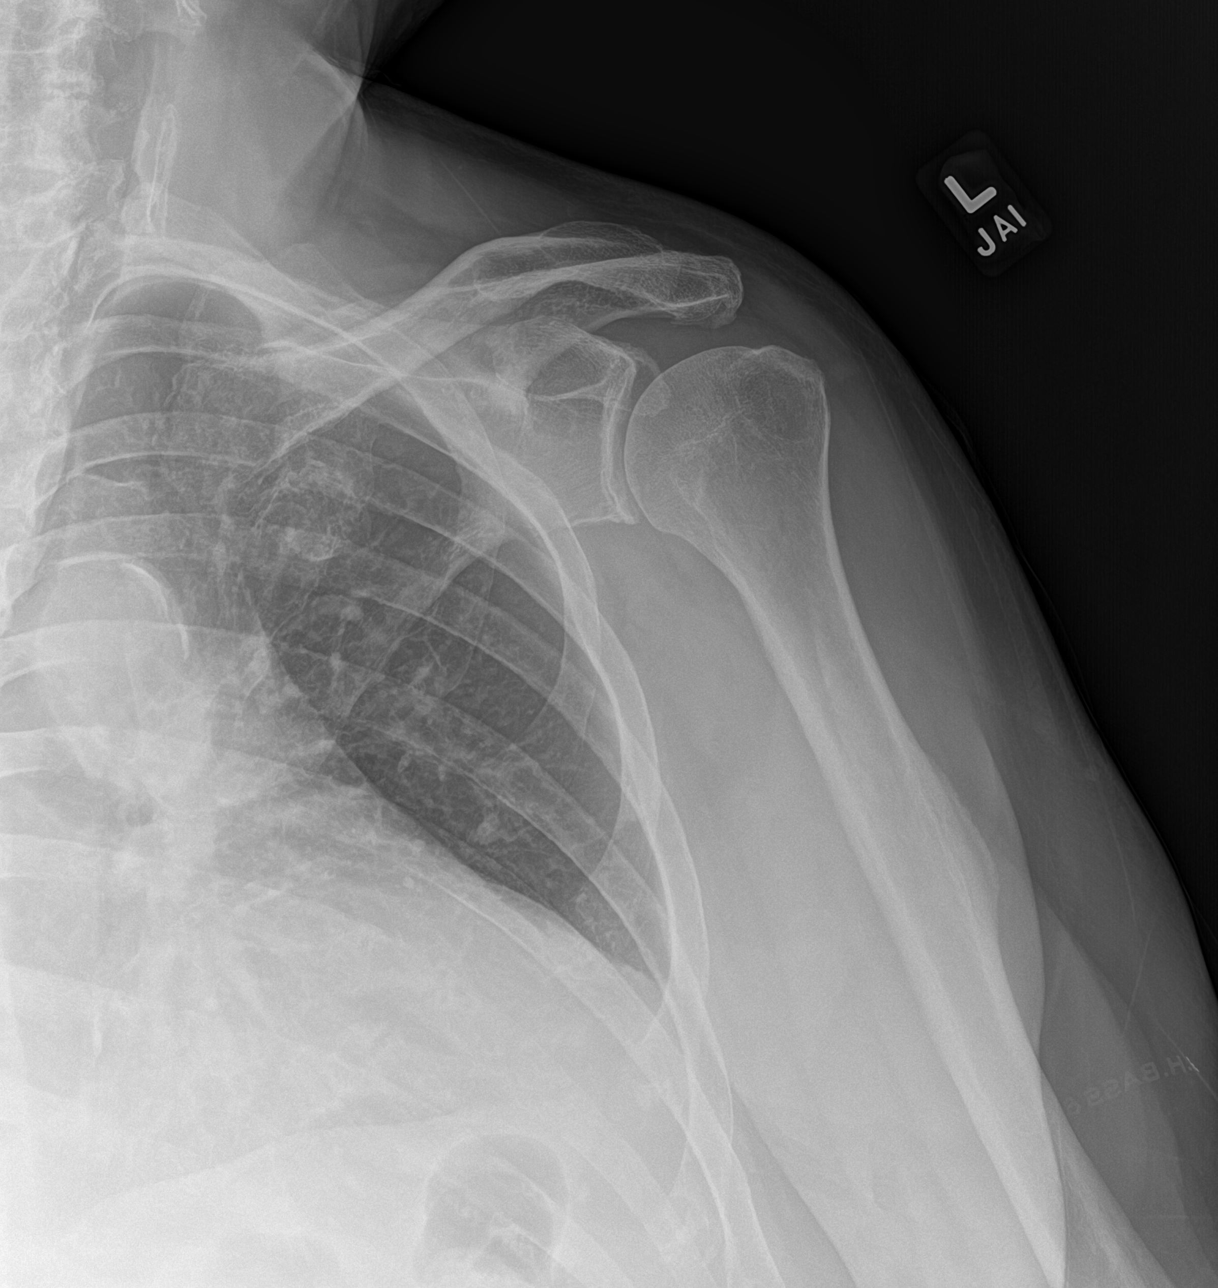

[shoulder ap (2 of 2)]
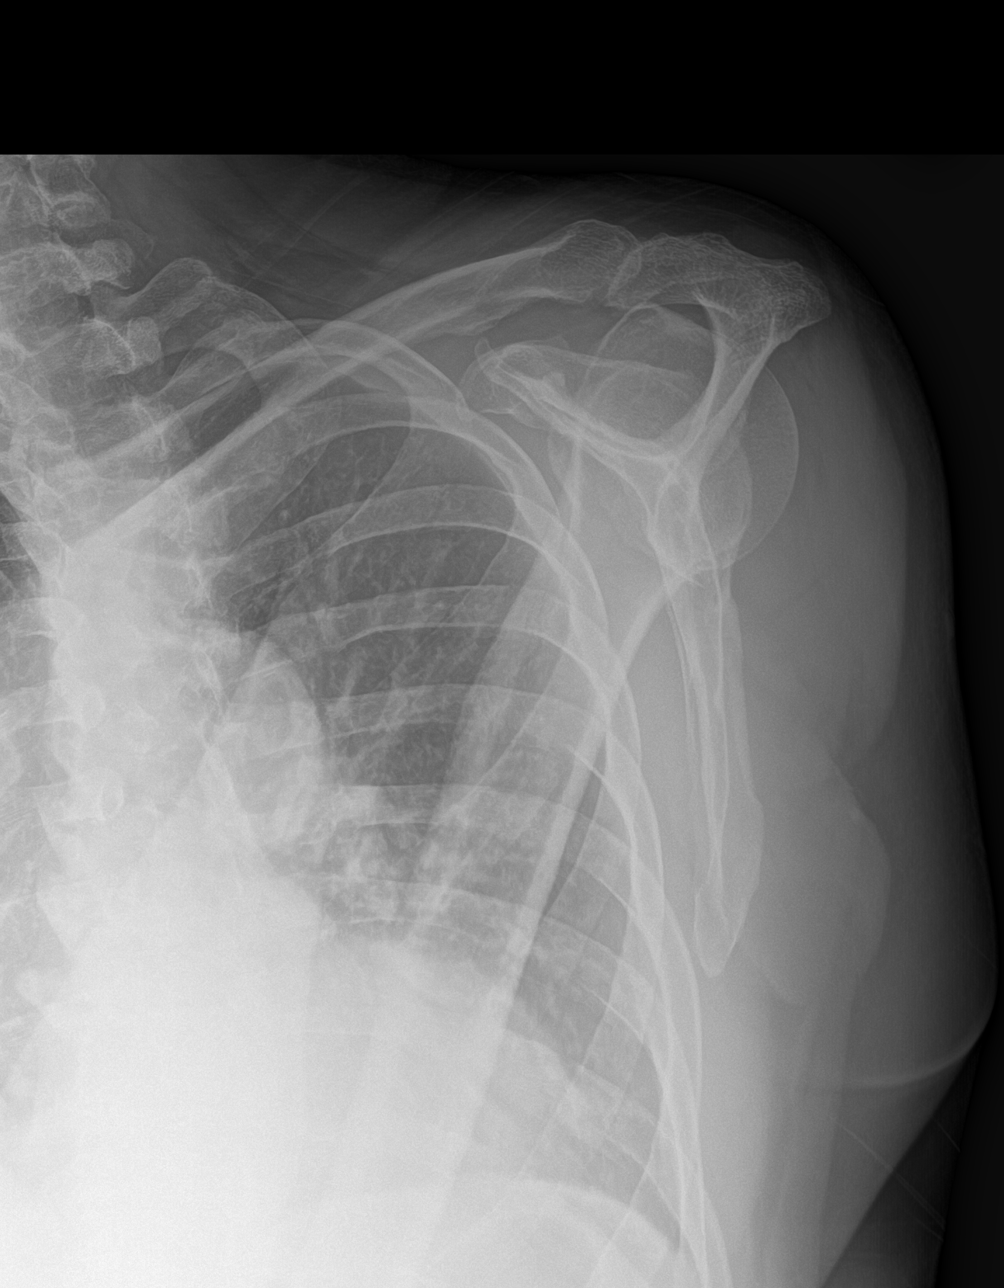

[shoulder axial]
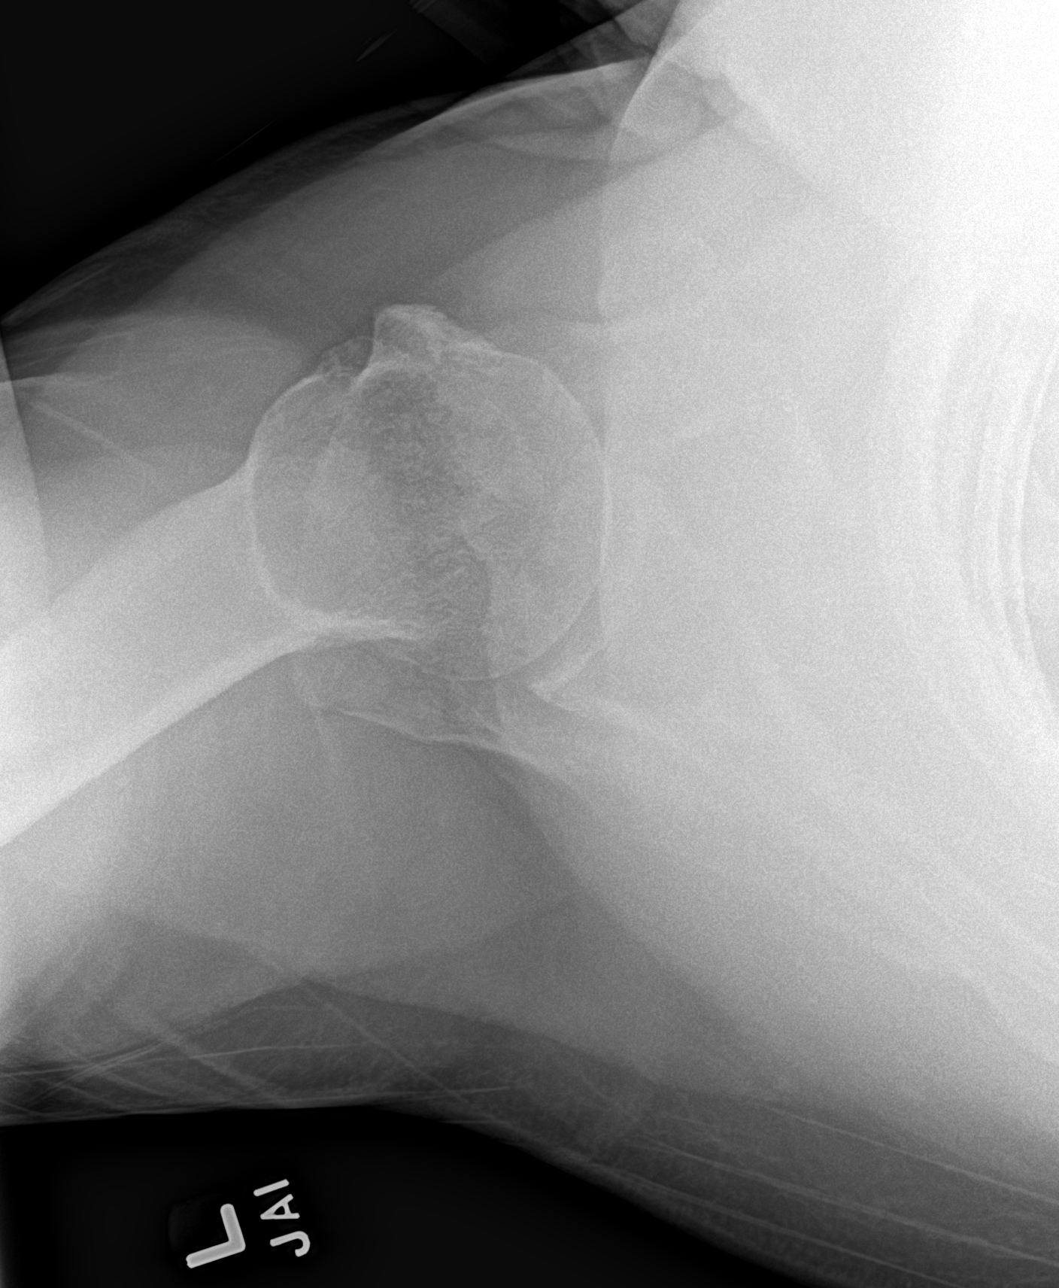

[3 of 3 positions shown; findings below may reference images not displayed]

FINDINGS: No glenohumeral joint dislocation. Bone mineralization is within
normal limits for age. No acute osseous abnormality identified. Mild
glenoid and acromion degenerative spurring. Glenohumeral joint space
appears relatively preserved. Negative visible left chest and ribs.
IMPRESSION: Mild for age degenerative osseous changes at the left shoulder. No
acute osseous abnormality identified.

## 2020-07-07 NOTE — Patient Instructions (Addendum)
Left shoulder xray today Monovisc injected in R knee Check back in 6-8 weeks

## 2020-07-07 NOTE — Assessment & Plan Note (Signed)
Patient does have patellofemoral arthritic changes.  Patient does have mild arthritis of the tricompartment.  Patient is going to increase activity and has had good response with Monovisc previously on the contralateral side.  We will see how patient responds as well.  Discussed which activities to do which wants to avoid.  Encouraged him to continue to watch weight.  Follow-up with me again 6 to 8 weeks.  Worsening pain would consider advanced imaging with patient actually having mild to moderate arthritic changes could be a potential candidate for arthroscopic but I think patient will do well.

## 2020-07-07 NOTE — Assessment & Plan Note (Signed)
Known arthritic changes.  We have discussed potential injections the patient is doing relatively well at this time.  No change in management.  Will consider at follow-up.

## 2020-07-11 ENCOUNTER — Telehealth: Payer: Self-pay | Admitting: Internal Medicine

## 2020-07-11 NOTE — Telephone Encounter (Signed)
LVM for pt to rtn my call to schedule AWV with [facility]. Please schedule appt if pt calls the office.  

## 2020-08-16 ENCOUNTER — Telehealth (INDEPENDENT_AMBULATORY_CARE_PROVIDER_SITE_OTHER): Payer: Medicare HMO | Admitting: Internal Medicine

## 2020-08-16 ENCOUNTER — Encounter: Payer: Self-pay | Admitting: Internal Medicine

## 2020-08-16 DIAGNOSIS — J301 Allergic rhinitis due to pollen: Secondary | ICD-10-CM

## 2020-08-16 MED ORDER — PREDNISONE 20 MG PO TABS: 20.0000 mg | ORAL_TABLET | Freq: Every day | ORAL | 0 refills | Status: DC

## 2020-08-16 MED ORDER — HYDROCODONE-HOMATROPINE 5-1.5 MG/5ML PO SYRP: 5.0000 mL | ORAL_SOLUTION | Freq: Three times a day (TID) | ORAL | 0 refills | Status: DC | PRN

## 2020-08-16 NOTE — Progress Notes (Signed)
Virtual Visit via Video Note  I connected with Edwin [name] on 08/16/20 at  1:40 PM EDT by a video enabled telemedicine application and verified that I am speaking with the correct person using two identifiers.   I discussed the limitations of evaluation and management by telemedicine and the availability of in person appointments. The patient expressed understanding and agreed to proceed.  Present for the visit:  Myself, Dr Billey Gosling, Kara Pacer.  The patient is currently at home and I am in the office.    No referring provider.    History of Present Illness: He is here for an acute visit for cold symptoms.  His symptoms started last Thursday.  He is experiencing nasal congestion, PND, itchy throat, water eyes, cough with mild sputum production.    He has tried taking claritin, mucinex      Review of Systems  Constitutional: Negative for fever.  HENT: Positive for congestion. Negative for ear pain, sinus pain and sore throat (itchy).        Pnd  Eyes: Positive for discharge (watery).  Respiratory: Positive for cough and sputum production. Negative for shortness of breath and wheezing.   Gastrointestinal: Negative for diarrhea and nausea.  Musculoskeletal: Negative for myalgias.  Neurological: Negative for dizziness and headaches.      Social History   Socioeconomic History  . Marital status: Married    Spouse name: Not on file  . Number of children: 2  . Years of education: 44  . Highest education level: Not on file  Occupational History  . 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 Smoking status: Never Smoker  . Smokeless tobacco: Never Used  Vaping Use  . Vaping Use: Never used  Substance and Sexual Activity  . Alcohol use: Yes    Alcohol/week: 0.0 standard drinks    Comment: socially, 3-5 beers/week  . Drug use: No  . Sexual activity: Yes  Other Topics Concern  . Not on file  Social History  Narrative   Fun: Travel, read, movies, concerts    Denies abuse and feels safe at home.    Social Determinants of Health   Financial Resource Strain: Not on file  Food Insecurity: Not on file  Transportation Needs: Not on file  Physical Activity: Not on file  Stress: Not on file  Social Connections: Not on file     Observations/Objective: Appears well in NAD Breathing normally  Assessment and Plan:  See Problem List for Assessment and Plan of chronic medical problems.   Follow Up Instructions:    I discussed the assessment and treatment plan with the patient. The patient was provided an opportunity to ask questions and all were answered. The patient agreed with the plan and demonstrated an understanding of the instructions.   The patient was advised to call back or seek an in-person evaluation if the symptoms worsen or if the condition fails to improve as anticipated.    [name], MD

## 2020-08-16 NOTE — Assessment & Plan Note (Signed)
Acute Symptoms mores suggestive of allergies, maybe viral infection - not likely bacterial  Continue claritin, mucinex Hycodan syrup prn Prednisone 20 mg daily x 5 days If no improvement he will let me know

## 2020-08-19 DIAGNOSIS — J988 Other specified respiratory disorders: Secondary | ICD-10-CM | POA: Diagnosis not present

## 2020-08-19 DIAGNOSIS — B9689 Other specified bacterial agents as the cause of diseases classified elsewhere: Secondary | ICD-10-CM | POA: Diagnosis not present

## 2020-08-19 DIAGNOSIS — R059 Cough, unspecified: Secondary | ICD-10-CM | POA: Diagnosis not present

## 2020-08-22 ENCOUNTER — Ambulatory Visit (INDEPENDENT_AMBULATORY_CARE_PROVIDER_SITE_OTHER): Payer: Medicare HMO | Admitting: Internal Medicine

## 2020-08-22 ENCOUNTER — Other Ambulatory Visit: Payer: Self-pay

## 2020-08-22 ENCOUNTER — Encounter: Payer: Self-pay | Admitting: Internal Medicine

## 2020-08-22 ENCOUNTER — Telehealth: Payer: Self-pay | Admitting: Internal Medicine

## 2020-08-22 DIAGNOSIS — J4530 Mild persistent asthma, uncomplicated: Secondary | ICD-10-CM

## 2020-08-22 DIAGNOSIS — R059 Cough, unspecified: Secondary | ICD-10-CM | POA: Diagnosis not present

## 2020-08-22 DIAGNOSIS — J45909 Unspecified asthma, uncomplicated: Secondary | ICD-10-CM | POA: Insufficient documentation

## 2020-08-22 MED ORDER — HYDROCOD POLST-CPM POLST ER 10-8 MG/5ML PO SUER: 5.0000 mL | Freq: Two times a day (BID) | ORAL | 0 refills | Status: DC | PRN

## 2020-08-22 MED ORDER — PREDNISONE 20 MG PO TABS: 40.0000 mg | ORAL_TABLET | Freq: Every day | ORAL | 0 refills | Status: DC

## 2020-08-22 NOTE — Progress Notes (Signed)
Subjective:    Patient ID: Edwin [name], male    DOB: [birth year], 75 y.o.   MRN: [id number]  HPI The patient is here for an acute visit.    I did a virtual visit with him 4/12 what sounded like seasonal allergies.  I have prescribed Hycodan and prednisone.  I advised he continue an oral antihistamine.  Neither 1 seem to help much.  He went to urgent care on Friday and had a chest x-ray, which was normal.  He was diagnosed with a bacterial respiratory infection.  He did have wheezing and rhonchi on exam.  He was prescribed a Z-Pak, Astelin nasal spray and advised to take NyQuil at night.  He states no significant improvement.   His biggest concern is the cough.  He has minimal sputum production at times.  The cough is during the day and at night.  He is not sleeping well.  He denies any other significant symptoms.  No fever, shortness of breath, wheeze or other cold symptoms      Medications and allergies reviewed with patient and updated if appropriate.  Patient Active Problem List   Diagnosis Date Noted  . Left shoulder pain 05/31/2020  . Pes anserine bursitis 05/05/2020  . Onychomycosis of right great toe 10/27/2019  . Piriformis syndrome of left side 02/05/2019  . CAD (coronary artery disease) 02/03/2019  . Greater trochanteric bursitis 01/01/2019  . Degenerative arthritis of left knee 05/08/2018  . Prediabetes 03/01/2018  . Muscle cramping 02/27/2018  . Subclinical hypothyroidism 02/27/2018  . Seasonal allergic rhinitis due to pollen 09/09/2017  . AC (acromioclavicular) arthritis 08/14/2017  . Tear of MCL (medial collateral ligament) of knee, right, initial encounter 06/05/2017  . Piriformis syndrome of right side 02/19/2017  . Lower back pain 02/15/2017  . Patellofemoral arthritis of right knee 12/30/2014  . Obesity 11/18/2014  . Nonspecific abnormal electrocardiogram (ECG) (EKG) 08/18/2014  . OSA (obstructive sleep apnea) 06/02/2014  . Achilles tendinosis  12/26/2012  . RBBB 05/20/2012  . Hyperlipidemia 03/03/2008  . Essential hypertension 03/03/2008  . History of colonic polyps 03/03/2008  . History of basal cell cancer 05/26/2007  . GILBERT'S SYNDROME 05/26/2007  . DIVERTICULOSIS, COLON 05/26/2007  . BPH (benign prostatic hyperplasia) 05/26/2007    Current Outpatient Medications on File Prior to Visit  Medication Sig Dispense Refill  . aspirin 81 MG tablet Take 81 mg by mouth daily.    [name] atorvastatin (LIPITOR) 10 MG tablet TAKE 1 TABLET EVERY OTHER DAY. 45 tablet 2  . azelastine (ASTELIN) 0.1 % nasal spray Place into the nose.    . Cholecalciferol (VITAMIN D3) 1000 UNITS CAPS Take 1 capsule by mouth daily.    . Cyanocobalamin (VITAMIN B-12 PO) Take by mouth as directed.    [name] HYDROcodone-homatropine (HYCODAN) 5-1.5 MG/5ML syrup Take 5 mLs by mouth every 8 (eight) hours as needed for cough. 120 each 0  . metoprolol succinate (TOPROL-XL) 50 MG 24 hr tablet TAKE 1 TABLET ONCE DAILY. TAKE WITH OR IMMEDIATELY FOLLOWING A MEAL. 90 tablet 3  . Multiple Vitamin (MULTIVITAMIN) tablet Take 1 tablet by mouth daily.    . naproxen sodium (ALEVE) 220 MG tablet Take 220 mg by mouth daily as needed.    . nitroGLYCERIN (NITROSTAT) 0.4 MG SL tablet Place 1 tablet (0.4 mg total) under the tongue every 5 (five) minutes as needed for chest pain. 25 tablet 3  . predniSONE (DELTASONE) 20 MG tablet Take 1 tablet (20 mg total) by mouth daily  with breakfast. 5 tablet 0  . Turmeric (QC TUMERIC COMPLEX PO) Take by mouth daily.     No current facility-administered medications on file prior to visit.    Past Medical History:  Diagnosis Date  . BPH (benign prostatic hypertrophy)   . Cancer ([place])    Basal cell of face, [name]  . Diverticulosis   . Gilbert syndrome   . Hyperglycemia   . Hyperlipidemia    NMR Lipoprofile 2005; LDL 146 (2007/1330), HDL 38, TG 169.  LDL goal = <100; Framingham Study LDL goal =<130  . Hypertension   . OSA on CPAP   .  Tubular adenoma of colon 01/19/08    Past Surgical History:  Procedure Laterality Date  . COLONOSCOPY  2014   neg; [facility]  . COLONOSCOPY W/ POLYPECTOMY  2004 & 2009   Tics;  [facility]  . KNEE ARTHROSCOPY     [name], left knee  . TONSILLECTOMY    . WISDOM TOOTH EXTRACTION      Social History   Socioeconomic History  . Marital status: Married    Spouse name: Not on file  . Number of children: 2  . Years of education: 90  . Highest education level: Not on file  Occupational History  . Occupation: Pharmacist, hospital // Retired    Fish farm manager: [employer]  Tobacco Use  . Smoking status: Never Smoker  . Smokeless tobacco: Never Used  Vaping Use  . Vaping Use: Never used  Substance and Sexual Activity  . Alcohol use: Yes    Alcohol/week: 0.0 standard drinks    Comment: socially, 3-5 beers/week  . Drug use: No  . Sexual activity: Yes  Other Topics Concern  . Not on file  Social History Narrative   Fun: Travel, read, movies, concerts    Denies abuse and feels safe at home.    Social Determinants of Health   Financial Resource Strain: Not on file  Food Insecurity: Not on file  Transportation Needs: Not on file  Physical Activity: Not on file  Stress: Not on file  Social Connections: Not on file    Family History  Problem Relation Age of Onset  . Diverticulosis Mother   . Stroke Mother        late 71s  . Parkinsonism Mother   . Diabetes Father   . Hypertension Father   . Heart disease Father 64       CABG  . Cancer Brother        lymphoma  . Appendicitis Brother        ruptured  . Asthma Brother        childhood  . Stroke Paternal Grandmother 68  . Heart attack Maternal Grandmother 80       questionable  . Heart attack Maternal Grandfather 57  . Parkinsonism Maternal Grandfather   . Hypertension Sister   . Appendicitis Sister        ruptured  . Colon cancer Neg Hx   . Esophageal cancer Neg Hx   . Rectal cancer Neg Hx   .  Stomach cancer Neg Hx     Review of Systems  Constitutional: Negative for fever.  HENT: Negative for congestion, ear pain, sinus pressure, sinus pain and sore throat.   Respiratory: Positive for cough (minmal sputum). Negative for chest tightness, shortness of breath and wheezing.   Gastrointestinal: Negative for abdominal pain and nausea.  Musculoskeletal: Negative for myalgias.  Neurological: Negative for light-headedness and  headaches.       Objective:   Vitals:   08/22/20 1415  BP: 132/74  Pulse: 84  Temp: 98.4 F (36.9 C)  SpO2: 95%   BP Readings from Last 3 Encounters:  08/22/20 132/74  07/07/20 110/72  06/13/20 110/80   Wt Readings from Last 3 Encounters:  08/22/20 265 lb (120.2 kg)  07/07/20 238 lb (108 kg)  06/13/20 239 lb (108.4 kg)   Body mass index is 39.13 kg/m.   Physical Exam    GENERAL APPEARANCE: Appears stated age, well appearing, NAD EYES: conjunctiva clear, no icterus LUNGS: Unlabored breathing, good air entry bilaterally, diffuse rhonchi and wheeze - improved after coughing  CARDIOVASCULAR: Normal S1,S2 , no edema SKIN: Warm, dry   Chest x-ray reviewed done in urgent care-no acute pulmonary disease   Assessment & Plan:    See Problem List for Assessment and Plan of chronic medical problems.    This visit occurred during the SARS-CoV-2 public health emergency.  Safety protocols were in place, including screening questions prior to the visit, additional usage of staff PPE, and extensive cleaning of exam room while observing appropriate contact time as indicated for disinfecting solutions.

## 2020-08-22 NOTE — Patient Instructions (Addendum)
   Medications changes include :   Prednisone 40 mg daily x 5 days.  Cough syrup.   Your prescription(s) have been submitted to your pharmacy. Please take as directed and contact our office if you believe you are having problem(s) with the medication(s).   Please call if there is no improvement in your symptoms.

## 2020-08-22 NOTE — Telephone Encounter (Signed)
[facility]   Caller states he was recently seen at office for allergies on Tuesday, rx a cough medication with hydrocodone, has not been able to sleep. Cough calms down for a while, but then comes back. States mildlyproductive cough continues. Denies fever or any other symptoms.  Advised to see PCP within 24 hours

## 2020-08-22 NOTE — Assessment & Plan Note (Signed)
Acute Persistent cough-occasionally productive of minimal sputum ?  Related to viral illness, allergies or bacterial infection Currently taking a Z-Pak With reactive airway disease-rhonchi and wheezing on exam Completed Z-Pak We will try Tussionex cough syrup-if that does not work he can retry the Hycodan Prednisone 40 mg daily for 5 days Call if no improvement

## 2020-08-25 NOTE — Telephone Encounter (Signed)
   Patient calling to report since visit on 4/18 he has not felt better, still coughing and has pain in rib area from coughing  He is asking to have additional testing, chest xray   Please advise

## 2020-08-26 ENCOUNTER — Encounter: Payer: Self-pay | Admitting: Internal Medicine

## 2020-08-26 ENCOUNTER — Ambulatory Visit (INDEPENDENT_AMBULATORY_CARE_PROVIDER_SITE_OTHER): Payer: Medicare HMO

## 2020-08-26 ENCOUNTER — Other Ambulatory Visit: Payer: Self-pay

## 2020-08-26 ENCOUNTER — Ambulatory Visit (INDEPENDENT_AMBULATORY_CARE_PROVIDER_SITE_OTHER): Payer: Medicare HMO | Admitting: Internal Medicine

## 2020-08-26 VITALS — BP 128/74 | HR 86 | Ht 69.0 in | Wt 244.0 lb

## 2020-08-26 DIAGNOSIS — R059 Cough, unspecified: Secondary | ICD-10-CM | POA: Diagnosis not present

## 2020-08-26 DIAGNOSIS — J209 Acute bronchitis, unspecified: Secondary | ICD-10-CM

## 2020-08-26 DIAGNOSIS — J4530 Mild persistent asthma, uncomplicated: Secondary | ICD-10-CM

## 2020-08-26 IMAGING — DX DG CHEST 2V
2 series · 2 of 2 positions shown · non-contrast
Comparison: [DATE]

CLINICAL DATA: Cough

EXAM:
CHEST - 2 VIEW

[chest pa]
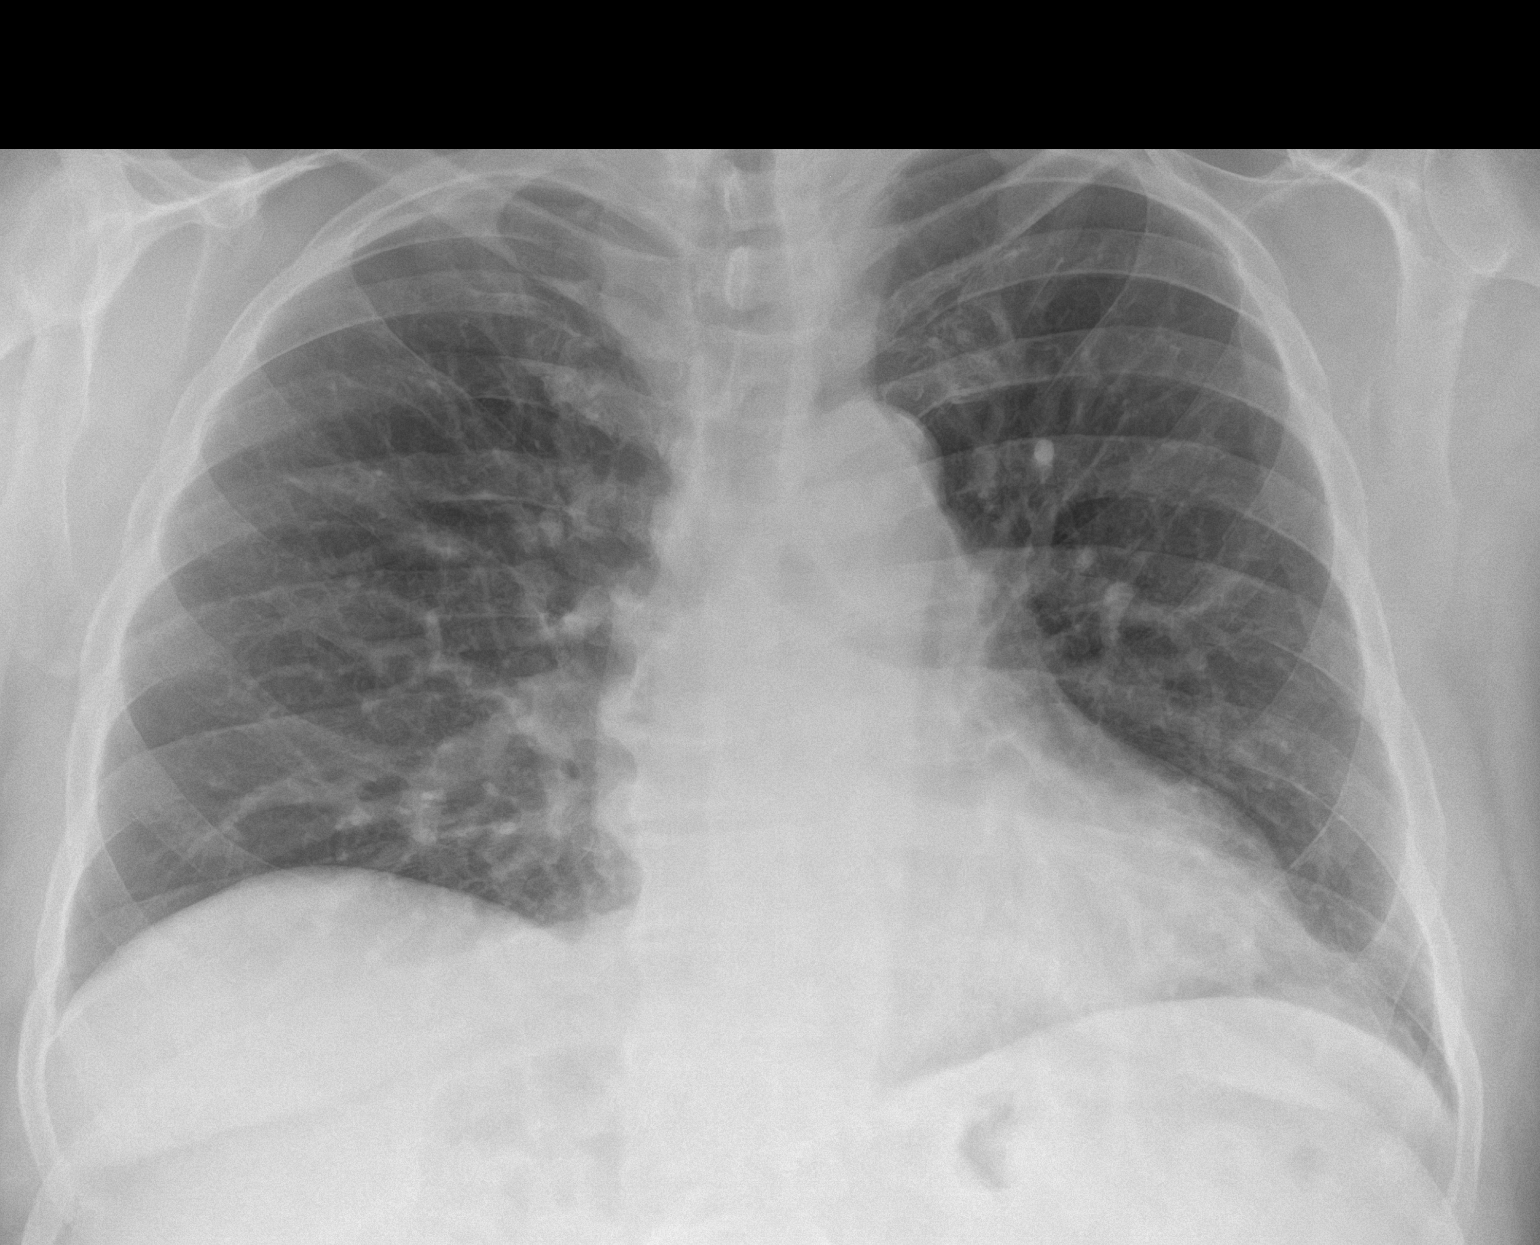

[chest lat]
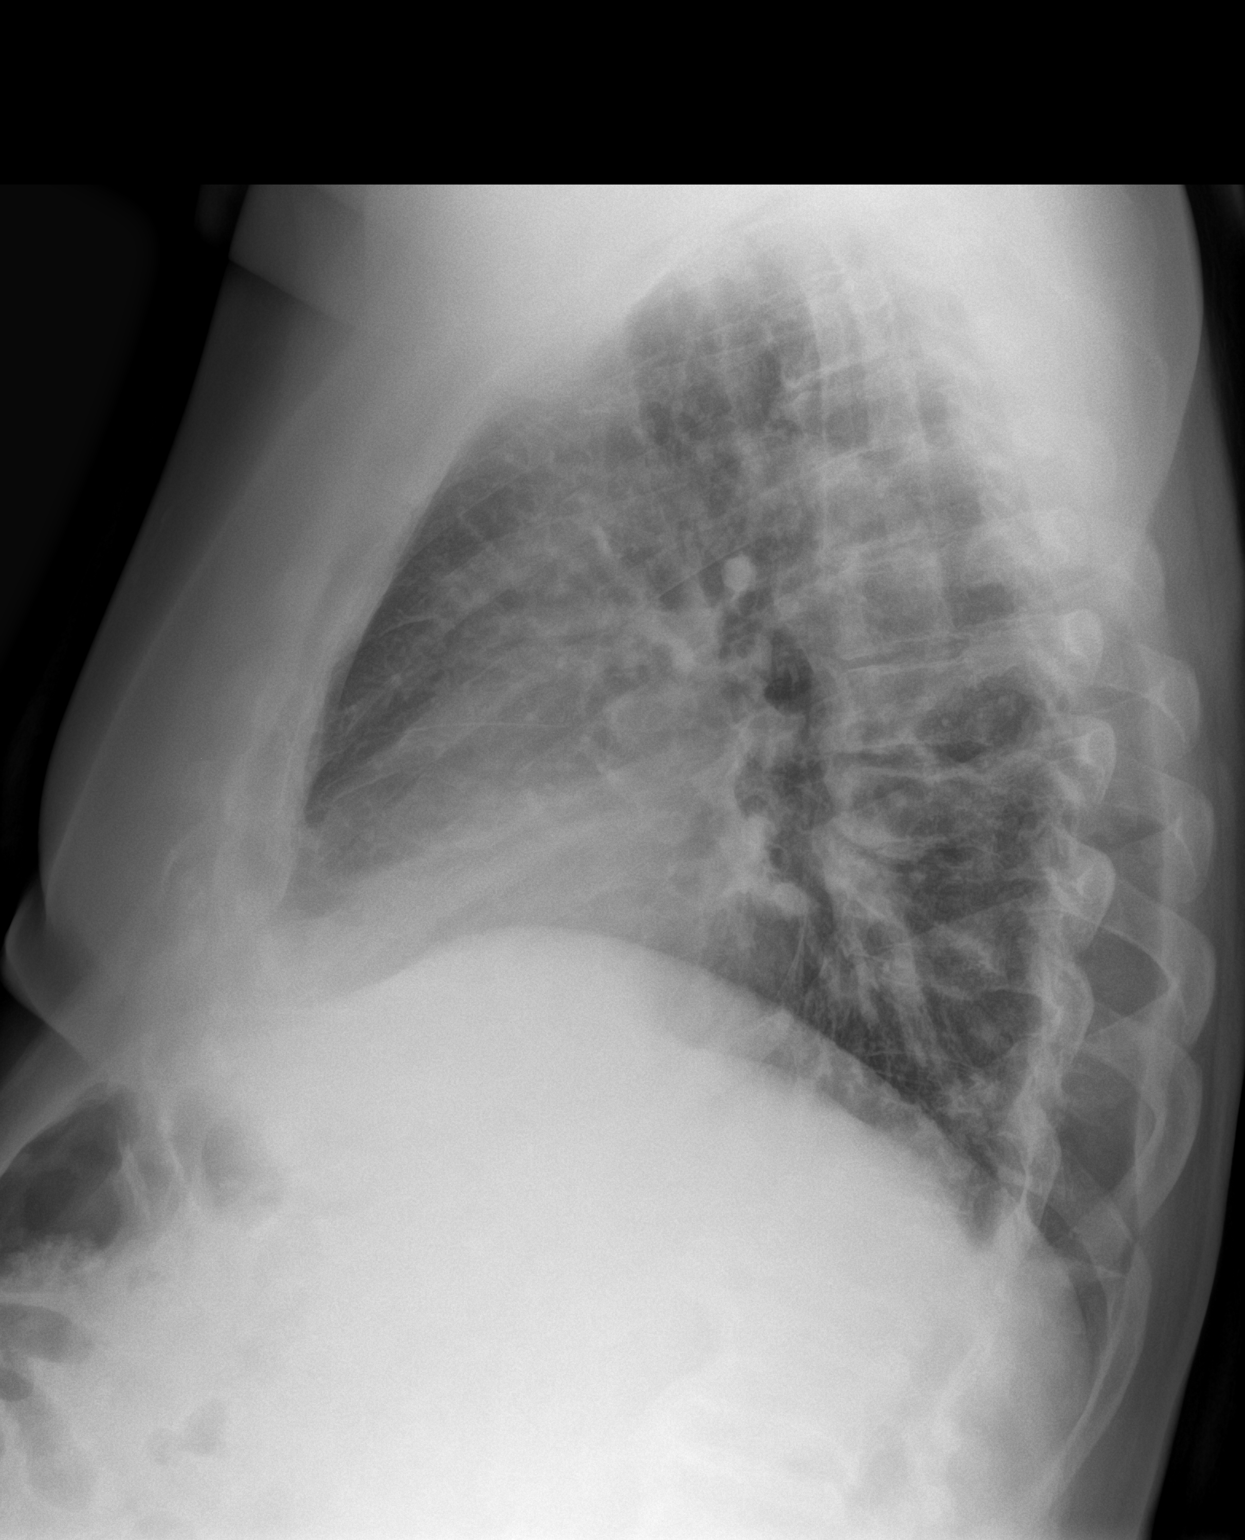

[2 of 2 positions shown; findings below may reference images not displayed]

FINDINGS: Heart size is upper limits of normal. No focal airspace
consolidation, pleural effusion, or pneumothorax. No acute osseous
findings.
IMPRESSION: No active cardiopulmonary disease.

## 2020-08-26 MED ORDER — DOXYCYCLINE HYCLATE 100 MG PO TABS: 100.0000 mg | ORAL_TABLET | Freq: Two times a day (BID) | ORAL | 0 refills | Status: DC

## 2020-08-26 MED ORDER — METHYLPREDNISOLONE ACETATE 80 MG/ML IJ SUSP
80.0000 mg | Freq: Once | INTRAMUSCULAR | Status: AC
  Administered 2020-08-26: 120 mg via INTRAMUSCULAR

## 2020-08-26 MED ORDER — HYDROCOD POLST-CPM POLST ER 10-8 MG/5ML PO SUER: 5.0000 mL | Freq: Two times a day (BID) | ORAL | 0 refills | Status: DC | PRN

## 2020-08-26 NOTE — Assessment & Plan Note (Signed)
Subacute Cough, rhonchi on exam persistent despite Z-Pak, prednisone Chest x-ray today Start doxycycline 100 mg twice daily x10 days Tussionex cough syrup Depo-Medrol 120 mg IM x1

## 2020-08-26 NOTE — Progress Notes (Signed)
Subjective:    Patient ID: Edwin [name], male    DOB: 1946/03/31, 75 y.o.   MRN: [id number]  HPI The patient is here for an acute visit.   Cough not better.  First thing in the morning the cough seems a little better, but it gets worse as the day goes on.  He has had a little wheeze.  He denies SOB. He has some chest pain from coughing.  He states fatigue.    Monday I prescribed - prednisone 40 mg daily and tussionex.      Medications and allergies reviewed with patient and updated if appropriate.  Patient Active Problem List   Diagnosis Date Noted  . Reactive airway disease with wheezing 08/22/2020  . Cough 08/22/2020  . Left shoulder pain 05/31/2020  . Pes anserine bursitis 05/05/2020  . Onychomycosis of right great toe 10/27/2019  . Piriformis syndrome of left side 02/05/2019  . CAD (coronary artery disease) 02/03/2019  . Greater trochanteric bursitis 01/01/2019  . Degenerative arthritis of left knee 05/08/2018  . Prediabetes 03/01/2018  . Muscle cramping 02/27/2018  . Subclinical hypothyroidism 02/27/2018  . Seasonal allergic rhinitis due to pollen 09/09/2017  . AC (acromioclavicular) arthritis 08/14/2017  . Tear of MCL (medial collateral ligament) of knee, right, initial encounter 06/05/2017  . Piriformis syndrome of right side 02/19/2017  . Lower back pain 02/15/2017  . Patellofemoral arthritis of right knee 12/30/2014  . Obesity 11/18/2014  . Nonspecific abnormal electrocardiogram (ECG) (EKG) 08/18/2014  . OSA (obstructive sleep apnea) 06/02/2014  . Achilles tendinosis 12/26/2012  . RBBB 05/20/2012  . Hyperlipidemia 03/03/2008  . Essential hypertension 03/03/2008  . History of colonic polyps 03/03/2008  . History of basal cell cancer 05/26/2007  . GILBERT'S SYNDROME 05/26/2007  . DIVERTICULOSIS, COLON 05/26/2007  . BPH (benign prostatic hyperplasia) 05/26/2007    Current Outpatient Medications on File Prior to Visit  Medication Sig Dispense Refill  .  aspirin 81 MG tablet Take 81 mg by mouth daily.    [name] atorvastatin (LIPITOR) 10 MG tablet TAKE 1 TABLET EVERY OTHER DAY. 45 tablet 2  . azelastine (ASTELIN) 0.1 % nasal spray Place into the nose.    . chlorpheniramine-HYDROcodone (TUSSIONEX PENNKINETIC ER) 10-8 MG/5ML SUER Take 5 mLs by mouth every 12 (twelve) hours as needed for cough. 100 mL 0  . Cholecalciferol (VITAMIN D3) 1000 UNITS CAPS Take 1 capsule by mouth daily.    . Cyanocobalamin (VITAMIN B-12 PO) Take by mouth as directed.    [name] HYDROcodone-homatropine (HYCODAN) 5-1.5 MG/5ML syrup Take 5 mLs by mouth every 8 (eight) hours as needed for cough. 120 each 0  . metoprolol succinate (TOPROL-XL) 50 MG 24 hr tablet TAKE 1 TABLET ONCE DAILY. TAKE WITH OR IMMEDIATELY FOLLOWING A MEAL. 90 tablet 3  . Multiple Vitamin (MULTIVITAMIN) tablet Take 1 tablet by mouth daily.    . naproxen sodium (ALEVE) 220 MG tablet Take 220 mg by mouth daily as needed.    . nitroGLYCERIN (NITROSTAT) 0.4 MG SL tablet Place 1 tablet (0.4 mg total) under the tongue every 5 (five) minutes as needed for chest pain. 25 tablet 3  . predniSONE (DELTASONE) 20 MG tablet Take 2 tablets (40 mg total) by mouth daily with breakfast. 10 tablet 0  . Turmeric (QC TUMERIC COMPLEX PO) Take by mouth daily.     No current facility-administered medications on file prior to visit.    Past Medical History:  Diagnosis Date  . BPH (benign prostatic hypertrophy)   .  Cancer ([place])    Basal cell of face, [name]  . Diverticulosis   . Gilbert syndrome   . Hyperglycemia   . Hyperlipidemia    NMR Lipoprofile 2005; LDL 146 (2007/1330), HDL 38, TG 169.  LDL goal = <100; Framingham Study LDL goal =<130  . Hypertension   . OSA on CPAP   . Tubular adenoma of colon 01/19/08    Past Surgical History:  Procedure Laterality Date  . COLONOSCOPY  2014   neg; [facility]  . COLONOSCOPY W/ POLYPECTOMY  2004 & 2009   Tics; [facility]  . KNEE ARTHROSCOPY     [name], left knee   . TONSILLECTOMY    . WISDOM TOOTH EXTRACTION      Social History   Socioeconomic History  . Marital status: Married    Spouse name: Not on file  . Number of children: 2  . Years of education: 42  . Highest education level: Not on file  Occupational History  . Occupation: Pharmacist, hospital // Retired    Fish farm manager: [employer]  Tobacco Use  . Smoking status: Never Smoker  . Smokeless tobacco: Never Used  Vaping Use  . Vaping Use: Never used  Substance and Sexual Activity  . Alcohol use: Yes    Alcohol/week: 0.0 standard drinks    Comment: socially, 3-5 beers/week  . Drug use: No  . Sexual activity: Yes  Other Topics Concern  . Not on file  Social History Narrative   Fun: Travel, read, movies, concerts    Denies abuse and feels safe at home.    Social Determinants of Health   Financial Resource Strain: Not on file  Food Insecurity: Not on file  Transportation Needs: Not on file  Physical Activity: Not on file  Stress: Not on file  Social Connections: Not on file    Family History  Problem Relation Age of Onset  . Diverticulosis Mother   . Stroke Mother        late 52s  . Parkinsonism Mother   . Diabetes Father   . Hypertension Father   . Heart disease Father 7       CABG  . Cancer Brother        lymphoma  . Appendicitis Brother        ruptured  . Asthma Brother        childhood  . Stroke Paternal Grandmother 97  . Heart attack Maternal Grandmother 80       questionable  . Heart attack Maternal Grandfather 57  . Parkinsonism Maternal Grandfather   . Hypertension Sister   . Appendicitis Sister        ruptured  . Colon cancer Neg Hx   . Esophageal cancer Neg Hx   . Rectal cancer Neg Hx   . Stomach cancer Neg Hx     Review of Systems  Constitutional: Positive for fatigue. Negative for appetite change and fever.  HENT: Negative for congestion, ear pain, sinus pain and sore throat.   Respiratory: Positive for cough and wheezing. Negative  for shortness of breath.   Cardiovascular: Positive for chest pain (right lower rib pain - coughing).       Objective:   Vitals:   08/26/20 1045  BP: 128/74  Pulse: 86  SpO2: 91%   BP Readings from Last 3 Encounters:  08/26/20 128/74  08/22/20 132/74  07/07/20 110/72   Wt Readings from Last 3 Encounters:  08/26/20 244 lb (110.7 kg)  08/22/20 265 lb (120.2 kg)  07/07/20 238 lb (108 kg)   Body mass index is 36.03 kg/m.   Physical Exam Constitutional:      General: He is not in acute distress.    Appearance: Normal appearance. He is not ill-appearing, toxic-appearing or diaphoretic.  HENT:     Head: Normocephalic and atraumatic.  Cardiovascular:     Rate and Rhythm: Normal rate and regular rhythm.  Pulmonary:     Effort: Pulmonary effort is normal. No respiratory distress.     Breath sounds: Rhonchi (diffuse b/l lungs) present.  Skin:    General: Skin is warm and dry.  Neurological:     Mental Status: He is alert.            Assessment & Plan:    See Problem List for Assessment and Plan of chronic medical problems.    This visit occurred during the SARS-CoV-2 public health emergency.  Safety protocols were in place, including screening questions prior to the visit, additional usage of staff PPE, and extensive cleaning of exam room while observing appropriate contact time as indicated for disinfecting solutions.

## 2020-08-26 NOTE — Patient Instructions (Signed)
  You had a steroid injection today.  A chest xray was ordered.     Medications changes include :   Doxycycline 100 mg twice daily x 10 days   Your prescription(s) have been submitted to your pharmacy. Please take as directed and contact our office if you believe you are having problem(s) with the medication(s).

## 2020-08-26 NOTE — Assessment & Plan Note (Signed)
Subacute Has not seen much improvement with 4 days of prednisone 40 mg daily Still coughing a lot and has little wheezing On exam bilateral rhonchi throughout lungs Typically does not have allergies Did take a Z-Pak, but I am concerned that maybe he has an infection and needs a stronger antibiotic Chest x-ray today Doxycycline 100 mg twice daily x10 days Depo-Medrol 120 mg IM x1 Continue Tussionex cough syrup-refilled today He will update me in a couple of days with how he is feeling Can consider inhaler, referral to pulmonary if needed

## 2020-08-28 ENCOUNTER — Encounter: Payer: Self-pay | Admitting: Internal Medicine

## 2020-08-31 NOTE — Progress Notes (Deleted)
[facility] [facility] Phone: [phone] Subjective:    I'm seeing this patient by the request  of:  [name], MD  CC:   [id number]   07/07/2020 Patient does have patellofemoral arthritic changes.  Patient does have mild arthritis of the tricompartment.  Patient is going to increase activity and has had good response with Monovisc previously on the contralateral side.  We will see how patient responds as well.  Discussed which activities to do which wants to avoid.  Encouraged him to continue to watch weight.  Follow-up with me again 6 to 8 weeks.  Worsening pain would consider advanced imaging with patient actually having mild to moderate arthritic changes could be a potential candidate for arthroscopic but I think patient will do well.  Update 09/01/2020 Edwin [name] is a 75 y.o. male coming in with complaint of R shoulder, R knee pain. Monovisc given last visit. Patient states       Past Medical History:  Diagnosis Date  . BPH (benign prostatic hypertrophy)   . Cancer ([place])    Basal cell of face, [name]  . Diverticulosis   . Gilbert syndrome   . Hyperglycemia   . Hyperlipidemia    NMR Lipoprofile 2005; LDL 146 (2007/1330), HDL 38, TG 169.  LDL goal = <100; Framingham Study LDL goal =<130  . Hypertension   . OSA on CPAP   . Tubular adenoma of colon 01/19/08   Past Surgical History:  Procedure Laterality Date  . COLONOSCOPY  2014   neg; [facility]  . COLONOSCOPY W/ POLYPECTOMY  2004 & 2009   Tics; [facility]  . KNEE ARTHROSCOPY     [name], left knee  . TONSILLECTOMY    . WISDOM TOOTH EXTRACTION     Social History   Socioeconomic History  . Marital status: Married    Spouse name: Not on file  . Number of children: 2  . Years of education: 71  . Highest education level: Not on file  Occupational History  . Occupation: Pharmacist, hospital // Retired    Fish farm manager: [employer]  Tobacco Use  . Smoking status: Never Smoker  . Smokeless tobacco: Never Used  Vaping Use  . Vaping Use: Never used  Substance and Sexual Activity  . Alcohol use: Yes    Alcohol/week: 0.0 standard drinks    Comment: socially, 3-5 beers/week  . Drug use: No  . Sexual activity: Yes  Other Topics Concern  . Not on file  Social History Narrative   Fun: Travel, read, movies, concerts    Denies abuse and feels safe at home.    Social Determinants of Health   Financial Resource Strain: Not on file  Food Insecurity: Not on file  Transportation Needs: Not on file  Physical Activity: Not on file  Stress: Not on file  Social Connections: Not on file   No Known Allergies Family History  Problem Relation Age of Onset  . Diverticulosis Mother   . Stroke Mother        late 63s  . Parkinsonism Mother   . Diabetes Father   . Hypertension Father   . Heart disease Father 62       CABG  . Cancer Brother        lymphoma  . Appendicitis Brother        ruptured  . Asthma Brother  childhood  . Stroke Paternal Grandmother 80  . Heart attack Maternal Grandmother 80       questionable  . Heart attack Maternal Grandfather 57  . Parkinsonism Maternal Grandfather   . Hypertension Sister   . Appendicitis Sister        ruptured  . Colon cancer Neg Hx   . Esophageal cancer Neg Hx   . Rectal cancer Neg Hx   . Stomach cancer Neg Hx     Current Outpatient Medications (Endocrine & Metabolic):  .  predniSONE (DELTASONE) 20 MG tablet, Take 2 tablets (40 mg total) by mouth daily with breakfast.  Current Outpatient Medications (Cardiovascular):  .  atorvastatin (LIPITOR) 10 MG tablet, TAKE 1 TABLET EVERY OTHER DAY. .  metoprolol succinate (TOPROL-XL) 50 MG 24 hr tablet, TAKE 1 TABLET ONCE DAILY. TAKE WITH OR IMMEDIATELY FOLLOWING A MEAL. .  nitroGLYCERIN (NITROSTAT) 0.4 MG SL tablet, Place 1 tablet (0.4 mg total) under the tongue every 5 (five) minutes as needed for chest  pain.  Current Outpatient Medications (Respiratory):  .  azelastine (ASTELIN) 0.1 % nasal spray, Place into the nose. .  chlorpheniramine-HYDROcodone (TUSSIONEX PENNKINETIC ER) 10-8 MG/5ML SUER, Take 5 mLs by mouth every 12 (twelve) hours as needed for cough.  Current Outpatient Medications (Analgesics):  .  aspirin 81 MG tablet, Take 81 mg by mouth daily. .  naproxen sodium (ALEVE) 220 MG tablet, Take 220 mg by mouth daily as needed.  Current Outpatient Medications (Hematological):  [name]  Cyanocobalamin (VITAMIN B-12 PO), Take by mouth as directed.  Current Outpatient Medications (Other):  [name]  Cholecalciferol (VITAMIN D3) 1000 UNITS CAPS, Take 1 capsule by mouth daily. [name]  doxycycline (VIBRA-TABS) 100 MG tablet, Take 1 tablet (100 mg total) by mouth 2 (two) times daily. .  Multiple Vitamin (MULTIVITAMIN) tablet, Take 1 tablet by mouth daily. .  Turmeric (QC TUMERIC COMPLEX PO), Take by mouth daily.   Reviewed prior external information including notes and imaging from  primary care provider As well as notes that were available from care everywhere and other healthcare systems.  Past medical history, social, surgical and family history all reviewed in electronic medical record.  No pertanent information unless stated regarding to the chief complaint.   Review of Systems:  No headache, visual changes, nausea, vomiting, diarrhea, constipation, dizziness, abdominal pain, skin rash, fevers, chills, night sweats, weight loss, swollen lymph nodes, body aches, joint swelling, chest pain, shortness of breath, mood changes. POSITIVE muscle aches  Objective  There were no vitals taken for this visit.   General: No apparent distress alert and oriented x3 mood and affect normal, dressed appropriately.  HEENT: Pupils equal, extraocular movements intact  Respiratory: Patient's speak in full sentences and does not appear short of breath  Cardiovascular: No lower extremity edema, non tender, no  erythema  Gait normal with good balance and coordination.  MSK:  Non tender with full range of motion and good stability and symmetric strength and tone of shoulders, elbows, wrist, hip, knee and ankles bilaterally.     Impression and Recommendations:     The above documentation has been reviewed and is accurate and complete Jacqualin Combes

## 2020-09-01 ENCOUNTER — Ambulatory Visit: Payer: Medicare HMO | Admitting: Family Medicine

## 2020-10-06 DIAGNOSIS — D3612 Benign neoplasm of peripheral nerves and autonomic nervous system, upper limb, including shoulder: Secondary | ICD-10-CM | POA: Diagnosis not present

## 2020-10-06 DIAGNOSIS — D224 Melanocytic nevi of scalp and neck: Secondary | ICD-10-CM | POA: Diagnosis not present

## 2020-10-06 DIAGNOSIS — L738 Other specified follicular disorders: Secondary | ICD-10-CM | POA: Diagnosis not present

## 2020-10-06 DIAGNOSIS — L821 Other seborrheic keratosis: Secondary | ICD-10-CM | POA: Diagnosis not present

## 2020-10-06 DIAGNOSIS — L570 Actinic keratosis: Secondary | ICD-10-CM | POA: Diagnosis not present

## 2020-10-06 DIAGNOSIS — D225 Melanocytic nevi of trunk: Secondary | ICD-10-CM | POA: Diagnosis not present

## 2020-10-06 DIAGNOSIS — D1801 Hemangioma of skin and subcutaneous tissue: Secondary | ICD-10-CM | POA: Diagnosis not present

## 2020-10-06 DIAGNOSIS — Z85828 Personal history of other malignant neoplasm of skin: Secondary | ICD-10-CM | POA: Diagnosis not present

## 2020-10-06 DIAGNOSIS — L814 Other melanin hyperpigmentation: Secondary | ICD-10-CM | POA: Diagnosis not present

## 2020-10-17 DIAGNOSIS — R972 Elevated prostate specific antigen [PSA]: Secondary | ICD-10-CM | POA: Diagnosis not present

## 2020-10-24 DIAGNOSIS — N5201 Erectile dysfunction due to arterial insufficiency: Secondary | ICD-10-CM | POA: Diagnosis not present

## 2020-10-24 DIAGNOSIS — R972 Elevated prostate specific antigen [PSA]: Secondary | ICD-10-CM | POA: Diagnosis not present

## 2020-10-27 NOTE — Progress Notes (Signed)
Subjective:    Patient ID: Edwin [name], male    DOB: August 09, 1945, 75 y.o.   MRN: [id number]  HPI He is here for a physical exam.   He denies any changes in his health.  He has recovered from his bronchitis episode.  He has no concerns.  Medications and allergies reviewed with patient and updated if appropriate.  Patient Active Problem List   Diagnosis Date Noted   Reactive airway disease with wheezing 08/22/2020   Cough 08/22/2020   Left shoulder pain 05/31/2020   Pes anserine bursitis 05/05/2020   Onychomycosis of right great toe 10/27/2019   Piriformis syndrome of left side 02/05/2019   CAD (coronary artery disease) 02/03/2019   Greater trochanteric bursitis 01/01/2019   Degenerative arthritis of left knee 05/08/2018   Prediabetes 03/01/2018   Muscle cramping 02/27/2018   Subclinical hypothyroidism 02/27/2018   Seasonal allergic rhinitis due to pollen 09/09/2017   [facility] (acromioclavicular) arthritis 08/14/2017   Tear of MCL (medial collateral ligament) of knee, right, initial encounter 06/05/2017   Piriformis syndrome of right side 02/19/2017   Lower back pain 02/15/2017   Acute bronchitis 10/09/2016   Patellofemoral arthritis of right knee 12/30/2014   Obesity 11/18/2014   Nonspecific abnormal electrocardiogram (ECG) (EKG) 08/18/2014   OSA (obstructive sleep apnea) 06/02/2014   Achilles tendinosis 12/26/2012   RBBB 05/20/2012   Hyperlipidemia 03/03/2008   Essential hypertension 03/03/2008   History of colonic polyps 03/03/2008   History of basal cell cancer 05/26/2007   GILBERT'S SYNDROME 05/26/2007   DIVERTICULOSIS, COLON 05/26/2007   BPH (benign prostatic hyperplasia) 05/26/2007    Current Outpatient Medications on File Prior to Visit  Medication Sig Dispense Refill   aspirin 81 MG tablet Take 81 mg by mouth daily.     atorvastatin (LIPITOR) 10 MG tablet TAKE 1 TABLET EVERY OTHER DAY. 45 tablet 2   azelastine (ASTELIN) 0.1 % nasal spray Place into the nose.      chlorpheniramine-HYDROcodone (TUSSIONEX PENNKINETIC ER) 10-8 MG/5ML SUER Take 5 mLs by mouth every 12 (twelve) hours as needed for cough. 100 mL 0   Cholecalciferol (VITAMIN D3) 1000 UNITS CAPS Take 1 capsule by mouth daily.     Cyanocobalamin (VITAMIN B-12 PO) Take by mouth as directed.     metoprolol succinate (TOPROL-XL) 50 MG 24 hr tablet TAKE 1 TABLET ONCE DAILY. TAKE WITH OR IMMEDIATELY FOLLOWING A MEAL. 90 tablet 3   Multiple Vitamin (MULTIVITAMIN) tablet Take 1 tablet by mouth daily.     naproxen sodium (ALEVE) 220 MG tablet Take 220 mg by mouth daily as needed.     nitroGLYCERIN (NITROSTAT) 0.4 MG SL tablet Place 1 tablet (0.4 mg total) under the tongue every 5 (five) minutes as needed for chest pain. 25 tablet 3   Turmeric (QC TUMERIC COMPLEX PO) Take by mouth daily.     No current facility-administered medications on file prior to visit.    Past Medical History:  Diagnosis Date   BPH (benign prostatic hypertrophy)    Cancer ([place])    Basal cell of face, [name]   Diverticulosis    Rosanna Randy syndrome    Hyperglycemia    Hyperlipidemia    NMR Lipoprofile 2005; LDL 146 (2007/1330), HDL 38, TG 169.  LDL goal = <100; Framingham Study LDL goal =<130   Hypertension    OSA on CPAP    Tubular adenoma of colon 01/19/08    Past Surgical History:  Procedure Laterality Date   COLONOSCOPY  2014   neg; [facility]   COLONOSCOPY W/ POLYPECTOMY  2004 & 2009   Tics; [facility]   KNEE ARTHROSCOPY     [name], left knee   TONSILLECTOMY     WISDOM TOOTH EXTRACTION      Social History   Socioeconomic History   Marital status: Married    Spouse name: Not on file   Number of children: 2   Years of education: 18   Highest education level: Not on file  Occupational History   Occupation: Pharmacist, hospital // Retired    Fish farm manager: [employer]  Tobacco Use   Smoking status: Never   Smokeless tobacco: Never  Vaping Use   Vaping Use: Never used   Substance and Sexual Activity   Alcohol use: Yes    Alcohol/week: 0.0 standard drinks    Comment: socially, 3-5 beers/week   Drug use: No   Sexual activity: Yes  Other Topics Concern   Not on file  Social History Narrative   Fun: Travel, read, movies, concerts    Denies abuse and feels safe at home.    Social Determinants of Health   Financial Resource Strain: Not on file  Food Insecurity: Not on file  Transportation Needs: Not on file  Physical Activity: Not on file  Stress: Not on file  Social Connections: Not on file    Family History  Problem Relation Age of Onset   Diverticulosis Mother    Stroke Mother        late 71s   Parkinsonism Mother    Diabetes Father    Hypertension Father    Heart disease Father 95       CABG   Cancer Brother        lymphoma   Appendicitis Brother        ruptured   Asthma Brother        childhood   Stroke Paternal Grandmother 52   Heart attack Maternal Grandmother 80       questionable   Heart attack Maternal Grandfather 35   Parkinsonism Maternal Grandfather    Hypertension Sister    Appendicitis Sister        ruptured   Colon cancer Neg Hx    Esophageal cancer Neg Hx    Rectal cancer Neg Hx    Stomach cancer Neg Hx     Review of Systems  Constitutional:  Negative for chills and fever.  Eyes:  Negative for visual disturbance.  Respiratory:  Negative for cough, shortness of breath and wheezing.   Cardiovascular:  Negative for chest pain, palpitations and leg swelling.  Gastrointestinal:  Negative for abdominal pain, blood in stool, constipation, diarrhea and nausea.       No gerd  Genitourinary:  Negative for dysuria.  Musculoskeletal:  Negative for arthralgias (mild stiffness) and back pain.  Skin:  Negative for rash.  Neurological:  Negative for dizziness, light-headedness and headaches.  Psychiatric/Behavioral:  Negative for dysphoric mood. The patient is not nervous/anxious.       Objective:   Vitals:    10/28/20 1004  BP: 122/80  Pulse: 63  Temp: 98.3 F (36.8 C)  SpO2: 97%   Filed Weights   10/28/20 1004  Weight: 244 lb (110.7 kg)   Body mass index is 36.03 kg/m.  BP Readings from Last 3 Encounters:  10/28/20 122/80  08/26/20 128/74  08/22/20 132/74    Wt Readings from Last 3 Encounters:  10/28/20 244 lb (110.7 kg)  08/26/20 244 lb (110.7 kg)  08/22/20 265 lb (120.2 kg)     Physical Exam Constitutional: He appears well-developed and well-nourished. No distress.  HENT:  Head: Normocephalic and atraumatic.  Right Ear: External ear normal.  Left Ear: External ear normal.  Mouth/Throat: Oropharynx is clear and moist.  Normal ear canals and TM b/l  Eyes: Conjunctivae and EOM are normal.  Neck: Neck supple. No tracheal deviation present. No thyromegaly present.  No carotid bruit  Cardiovascular: Normal rate, regular rhythm, normal heart sounds and intact distal pulses.   No murmur heard. Pulmonary/Chest: Effort normal and breath sounds normal. No respiratory distress. He has no wheezes. He has no rales.  Abdominal: Soft. He exhibits no distension. There is no tenderness.  Genitourinary: deferred  Musculoskeletal: He exhibits no edema.  Lymphadenopathy:   He has no cervical adenopathy.  Skin: Skin is warm and dry. He is not diaphoretic.  Psychiatric: He has a normal mood and affect. His behavior is normal.         Assessment & Plan:   Physical exam: Screening blood work  ordered Immunizations  discussed covid booster, tdap Colonoscopy   Up to date  Eye exams   Up to date  Exercise   regular - walking, goes to gym Weight  advised weight loss Substance abuse    none Sees derm annually   See Problem List for Assessment and Plan of chronic medical problems.   This visit occurred during the SARS-CoV-2 public health emergency.  Safety protocols were in place, including screening questions prior to the visit, additional usage of staff PPE, and extensive cleaning  of exam room while observing appropriate contact time as indicated for disinfecting solutions.

## 2020-10-27 NOTE — Patient Instructions (Addendum)
Blood work was ordered.     Medications changes include :   none    Please followup in 6 months     Health Maintenance, Male Adopting a healthy lifestyle and getting preventive care are important in promoting health and wellness. Ask your health care provider about: The right schedule for you to have regular tests and exams. Things you can do on your own to prevent diseases and keep yourself healthy. What should I know about diet, weight, and exercise? Eat a healthy diet  Eat a diet that includes plenty of vegetables, fruits, low-fat dairy products, and lean protein. Do not eat a lot of foods that are high in solid fats, added sugars, or sodium.  Maintain a healthy weight Body mass index (BMI) is a measurement that can be used to identify possible weight problems. It estimates body fat based on height and weight. Your health care provider can help determine your BMI and help you achieve or maintain ahealthy weight. Get regular exercise Get regular exercise. This is one of the most important things you can do for your health. Most adults should: Exercise for at least 150 minutes each week. The exercise should increase your heart rate and make you sweat (moderate-intensity exercise). Do strengthening exercises at least twice a week. This is in addition to the moderate-intensity exercise. Spend less time sitting. Even light physical activity can be beneficial. Watch cholesterol and blood lipids Have your blood tested for lipids and cholesterol at 73 years of age, then havethis test every 5 years. You may need to have your cholesterol levels checked more often if: Your lipid or cholesterol levels are high. You are older than 73 years of age. You are at high risk for heart disease. What should I know about cancer screening? Many types of cancers can be detected early and may often be prevented. Depending on your health history and family history, you may need to have cancer screening  at various ages. This may include screening for: Colorectal cancer. Prostate cancer. Skin cancer. Lung cancer. What should I know about heart disease, diabetes, and high blood pressure? Blood pressure and heart disease High blood pressure causes heart disease and increases the risk of stroke. This is more likely to develop in people who have high blood pressure readings, are of African descent, or are overweight. Talk with your health care provider about your target blood pressure readings. Have your blood pressure checked: Every 3-5 years if you are 59-48 years of age. Every year if you are 59 years old or older. If you are between the ages of 65 and 38 and are a current or former smoker, ask your health care provider if you should have a one-time screening for abdominal aortic aneurysm (AAA). Diabetes Have regular diabetes screenings. This checks your fasting blood sugar level. Have the screening done: Once every three years after age 41 if you are at a normal weight and have a low risk for diabetes. More often and at a younger age if you are overweight or have a high risk for diabetes. What should I know about preventing infection? Hepatitis B If you have a higher risk for hepatitis B, you should be screened for this virus. Talk with your health care provider to find out if you are at risk forhepatitis B infection. Hepatitis C Blood testing is recommended for: Everyone born from [birth year] through 1963. Anyone with known risk factors for hepatitis C. Sexually transmitted infections (STIs) You should be screened each year  for STIs, including gonorrhea and chlamydia, if: You are sexually active and are younger than 73 years of age. You are older than 73 years of age and your health care provider tells you that you are at risk for this type of infection. Your sexual activity has changed since you were last screened, and you are at increased risk for chlamydia or gonorrhea. Ask your health care  provider if you are at risk. Ask your health care provider about whether you are at high risk for HIV. Your health care provider may recommend a prescription medicine to help prevent HIV infection. If you choose to take medicine to prevent HIV, you should first get tested for HIV. You should then be tested every 3 months for as long as you are taking the medicine. Follow these instructions at home: Lifestyle Do not use any products that contain nicotine or tobacco, such as cigarettes, e-cigarettes, and chewing tobacco. If you need help quitting, ask your health care provider. Do not use street drugs. Do not share needles. Ask your health care provider for help if you need support or information about quitting drugs. Alcohol use Do not drink alcohol if your health care provider tells you not to drink. If you drink alcohol: Limit how much you have to 0-2 drinks a day. Be aware of how much alcohol is in your drink. In the [place] one drink equals one 12 oz bottle of beer (355 mL), one 5 oz glass of wine (148 mL), or one 1 oz glass of hard liquor (44 mL). General instructions Schedule regular health, dental, and eye exams. Stay current with your vaccines. Tell your health care provider if: You often feel depressed. You have ever been abused or do not feel safe at home. Summary Adopting a healthy lifestyle and getting preventive care are important in promoting health and wellness. Follow your health care provider's instructions about healthy diet, exercising, and getting tested or screened for diseases. Follow your health care provider's instructions on monitoring your cholesterol and blood pressure. This information is not intended to replace advice given to you by your health care provider. Make sure you discuss any questions you have with your healthcare provider. Document Revised: 04/16/2018 Document Reviewed: 04/16/2018 Elsevier Patient Education  2022 [employer].

## 2020-10-28 ENCOUNTER — Encounter: Payer: Self-pay | Admitting: Internal Medicine

## 2020-10-28 ENCOUNTER — Ambulatory Visit (INDEPENDENT_AMBULATORY_CARE_PROVIDER_SITE_OTHER): Payer: Medicare HMO | Admitting: Internal Medicine

## 2020-10-28 ENCOUNTER — Other Ambulatory Visit: Payer: Self-pay

## 2020-10-28 VITALS — BP 122/80 | HR 63 | Temp 98.3°F | Ht 69.0 in | Wt 244.0 lb

## 2020-10-28 DIAGNOSIS — I2510 Atherosclerotic heart disease of native coronary artery without angina pectoris: Secondary | ICD-10-CM

## 2020-10-28 DIAGNOSIS — G4733 Obstructive sleep apnea (adult) (pediatric): Secondary | ICD-10-CM

## 2020-10-28 DIAGNOSIS — E038 Other specified hypothyroidism: Secondary | ICD-10-CM

## 2020-10-28 DIAGNOSIS — I10 Essential (primary) hypertension: Secondary | ICD-10-CM

## 2020-10-28 DIAGNOSIS — R7303 Prediabetes: Secondary | ICD-10-CM | POA: Diagnosis not present

## 2020-10-28 DIAGNOSIS — E782 Mixed hyperlipidemia: Secondary | ICD-10-CM

## 2020-10-28 DIAGNOSIS — Z0000 Encounter for general adult medical examination without abnormal findings: Secondary | ICD-10-CM | POA: Diagnosis not present

## 2020-10-28 NOTE — Assessment & Plan Note (Signed)
Chronic Check a1c Low sugar / carb diet Stressed regular exercise  

## 2020-10-28 NOTE — Assessment & Plan Note (Signed)
Chronic No symptoms suggest hypothyroidism-clinically euthyroid Check TSH

## 2020-10-28 NOTE — Assessment & Plan Note (Signed)
Chronic Uses CPAP nightly

## 2020-10-28 NOTE — Assessment & Plan Note (Signed)
Chronic BP well controlled Continue metoprolol XL 50 mg daily cmp

## 2020-10-28 NOTE — Assessment & Plan Note (Signed)
Chronic Following with cardiology No symptoms consistent with angina Continue aspirin 81 mg daily, metoprolol XL 50 mg daily, atorvastatin 10 mg daily Check lipid panel, CMP, CBC, TSH

## 2020-10-28 NOTE — Assessment & Plan Note (Signed)
Chronic Check lipid panel  Continue atorvastatin 10 mg daily Regular exercise and healthy diet encouraged  

## 2020-11-01 ENCOUNTER — Other Ambulatory Visit (INDEPENDENT_AMBULATORY_CARE_PROVIDER_SITE_OTHER): Payer: Medicare HMO

## 2020-11-01 DIAGNOSIS — R7303 Prediabetes: Secondary | ICD-10-CM

## 2020-11-01 DIAGNOSIS — E782 Mixed hyperlipidemia: Secondary | ICD-10-CM | POA: Diagnosis not present

## 2020-11-01 DIAGNOSIS — Z0000 Encounter for general adult medical examination without abnormal findings: Secondary | ICD-10-CM

## 2020-11-01 DIAGNOSIS — E038 Other specified hypothyroidism: Secondary | ICD-10-CM

## 2020-11-01 DIAGNOSIS — I2510 Atherosclerotic heart disease of native coronary artery without angina pectoris: Secondary | ICD-10-CM | POA: Diagnosis not present

## 2020-11-01 DIAGNOSIS — I10 Essential (primary) hypertension: Secondary | ICD-10-CM

## 2020-11-01 LAB — COMPREHENSIVE METABOLIC PANEL
ALT: 23 U/L (ref 0–53)
AST: 17 U/L (ref 0–37)
Albumin: 4.2 g/dL (ref 3.5–5.2)
Alkaline Phosphatase: 32 U/L — ABNORMAL LOW (ref 39–117)
BUN: 21 mg/dL (ref 6–23)
CO2: 27 mEq/L (ref 19–32)
Calcium: 9.1 mg/dL (ref 8.4–10.5)
Chloride: 105 mEq/L (ref 96–112)
Creatinine, Ser: 0.96 mg/dL (ref 0.40–1.50)
GFR: 78.77 mL/min (ref >60.00)
Glucose, Bld: 127 mg/dL — ABNORMAL HIGH (ref 70–99)
Potassium: 4.1 mEq/L (ref 3.5–5.1)
Sodium: 141 mEq/L (ref 135–145)
Total Bilirubin: 0.9 mg/dL (ref 0.2–1.2)
Total Protein: 6.7 g/dL (ref 6.0–8.3)

## 2020-11-01 LAB — CBC WITH DIFFERENTIAL/PLATELET
Basophils Absolute: 0 10*3/uL (ref 0.0–0.1)
Basophils Relative: 1 % (ref 0.0–3.0)
Eosinophils Absolute: 0.2 10*3/uL (ref 0.0–0.7)
Eosinophils Relative: 3.6 % (ref 0.0–5.0)
HCT: 43.5 % (ref 39.0–52.0)
Hemoglobin: 15.4 g/dL (ref 13.0–17.0)
Lymphocytes Relative: 31.9 % (ref 12.0–46.0)
Lymphs Abs: 1.6 10*3/uL (ref 0.7–4.0)
MCHC: 35.3 g/dL (ref 30.0–36.0)
MCV: 95.9 fl (ref 78.0–100.0)
Monocytes Absolute: 0.5 10*3/uL (ref 0.1–1.0)
Monocytes Relative: 9.5 % (ref 3.0–12.0)
Neutro Abs: 2.7 10*3/uL (ref 1.4–7.7)
Neutrophils Relative %: 54 % (ref 43.0–77.0)
Platelets: 164 10*3/uL (ref 150.0–400.0)
RBC: 4.54 Mil/uL (ref 4.22–5.81)
RDW: 12.5 % (ref 11.5–15.5)
WBC: 5 10*3/uL (ref 4.0–10.5)

## 2020-11-01 LAB — LIPID PANEL
Cholesterol: 141 mg/dL (ref 0–200)
HDL: 46.7 mg/dL (ref >39.00)
LDL Cholesterol: 69 mg/dL (ref 0–99)
NonHDL: 94.13
Total CHOL/HDL Ratio: 3
Triglycerides: 128 mg/dL (ref 0.0–149.0)
VLDL: 25.6 mg/dL (ref 0.0–40.0)

## 2020-11-01 LAB — HEMOGLOBIN A1C: Hgb A1c MFr Bld: 6.2 % (ref 4.6–6.5)

## 2020-11-01 LAB — TSH: TSH: 5.63 u[IU]/mL — ABNORMAL HIGH (ref 0.35–4.50)

## 2020-11-02 ENCOUNTER — Other Ambulatory Visit: Payer: Self-pay | Admitting: Internal Medicine

## 2020-11-03 ENCOUNTER — Ambulatory Visit: Payer: Medicare HMO | Admitting: Family Medicine

## 2020-11-14 NOTE — Progress Notes (Signed)
[facility] [address] [place] Phone: [phone] Subjective:   I Edwin [name] am serving as a Education administrator for [name].  This visit occurred during the SARS-CoV-2 public health emergency.  Safety protocols were in place, including screening questions prior to the visit, additional usage of staff PPE, and extensive cleaning of exam room while observing appropriate contact time as indicated for disinfecting solutions.   I'm seeing this patient by the request  of:  [name], MD  CC: Knee and shoulder pain follow-up  [id number]  07/07/2020 Patient does have patellofemoral arthritic changes.  Patient does have mild arthritis of the tricompartment.  Patient is going to increase activity and has had good response with Monovisc previously on the contralateral side.  We will see how patient responds as well.  Discussed which activities to do which wants to avoid.  Encouraged him to continue to watch weight.  Follow-up with me again 6 to 8 weeks.  Worsening pain would consider advanced imaging with patient actually having mild to moderate arthritic changes could be a potential candidate for arthroscopic but I think patient will do well.  Known arthritic changes.  We have discussed potential injections the patient is doing relatively well at this time.  No change in management.  Will consider at follow-up.  Update 11/18/2020 Edwin [name] is a 75 y.o. male coming in with complaint of R knee and R AC joint pain. Left shoulder is bothering him more than right. Right shoulder is making progress. Certain ROM flares up the shoulder. Right knee is doing well. Left is doing well. Patient states that overall doing relatively well but is continuing to have discomfort more of the left shoulder than anything else.  Patient states certain range of motion gives him difficulty.  Denies any weakness.  Denies any nighttime awakenings.      Past Medical  History:  Diagnosis Date   BPH (benign prostatic hypertrophy)    Cancer ([place])    Basal cell of face, [name]   Diverticulosis    Rosanna Randy syndrome    Hyperglycemia    Hyperlipidemia    NMR Lipoprofile 2005; LDL 146 (2007/1330), HDL 38, TG 169.  LDL goal = <100; Framingham Study LDL goal =<130   Hypertension    OSA on CPAP    Tubular adenoma of colon 01/19/08   Past Surgical History:  Procedure Laterality Date   COLONOSCOPY  2014   neg; [facility]   COLONOSCOPY W/ POLYPECTOMY  2004 & 2009   Tics; [facility]   KNEE ARTHROSCOPY     [name], left knee   TONSILLECTOMY     WISDOM TOOTH EXTRACTION     Social History   Socioeconomic History   Marital status: Married    Spouse name: Not on file   Number of children: 2   Years of education: 18   Highest education level: Not on file  Occupational History   Occupation: Pharmacist, hospital // Retired    Fish farm manager: [place]  Tobacco Use   Smoking status: Never   Smokeless tobacco: Never  Vaping Use   Vaping Use: Never used  Substance and Sexual Activity   Alcohol use: Yes    Alcohol/week: 0.0 standard drinks    Comment: socially, 3-5 beers/week   Drug use: No   Sexual activity: Yes  Other Topics Concern   Not on file  Social History Narrative   Fun: Travel, read,  movies, concerts    Denies abuse and feels safe at home.    Social Determinants of Health   Financial Resource Strain: Not on file  Food Insecurity: Not on file  Transportation Needs: Not on file  Physical Activity: Not on file  Stress: Not on file  Social Connections: Not on file   No Known Allergies Family History  Problem Relation Age of Onset   Diverticulosis Mother    Stroke Mother        late 61s   Parkinsonism Mother    Diabetes Father    Hypertension Father    Heart disease Father 70       CABG   Cancer Brother        lymphoma   Appendicitis Brother        ruptured   Asthma Brother        childhood   Stroke  Paternal Grandmother 69   Heart attack Maternal Grandmother 80       questionable   Heart attack Maternal Grandfather 41   Parkinsonism Maternal Grandfather    Hypertension Sister    Appendicitis Sister        ruptured   Colon cancer Neg Hx    Esophageal cancer Neg Hx    Rectal cancer Neg Hx    Stomach cancer Neg Hx      Current Outpatient Medications (Cardiovascular):    atorvastatin (LIPITOR) 10 MG tablet, TAKE 1 TABLET EVERY OTHER DAY.   metoprolol succinate (TOPROL-XL) 50 MG 24 hr tablet, TAKE 1 TABLET ONCE DAILY. TAKE WITH OR IMMEDIATELY FOLLOWING A MEAL.   nitroGLYCERIN (NITROSTAT) 0.4 MG SL tablet, Place 1 tablet (0.4 mg total) under the tongue every 5 (five) minutes as needed for chest pain.  Current Outpatient Medications (Respiratory):    azelastine (ASTELIN) 0.1 % nasal spray, Place into the nose.  Current Outpatient Medications (Analgesics):    aspirin 81 MG tablet, Take 81 mg by mouth daily.   naproxen sodium (ALEVE) 220 MG tablet, Take 220 mg by mouth daily as needed.  Current Outpatient Medications (Hematological):    Cyanocobalamin (VITAMIN B-12 PO), Take by mouth as directed.  Current Outpatient Medications (Other):    Cholecalciferol (VITAMIN D3) 1000 UNITS CAPS, Take 1 capsule by mouth daily.   Multiple Vitamin (MULTIVITAMIN) tablet, Take 1 tablet by mouth daily.   Turmeric (QC TUMERIC COMPLEX PO), Take by mouth daily.   Reviewed prior external information including notes and imaging from  primary care provider As well as notes that were available from care everywhere and other healthcare systems.  Past medical history, social, surgical and family history all reviewed in electronic medical record.  No pertanent information unless stated regarding to the chief complaint.   Review of Systems:  No headache, visual changes, nausea, vomiting, diarrhea, constipation, dizziness, abdominal pain, skin rash, fevers, chills, night sweats, weight loss, swollen lymph  nodes, body aches, joint swelling, chest pain, shortness of breath, mood changes. POSITIVE muscle aches  Objective  Blood pressure 136/72, pulse 64, height 5\' 9"  (1.753 m), weight 246 lb (111.6 kg), SpO2 98 %.   General: No apparent distress alert and oriented x3 mood and affect normal, dressed appropriately.  HEENT: Pupils equal, extraocular movements intact  Respiratory: Patient's speak in full sentences and does not appear short of breath  Cardiovascular: No lower extremity edema, non tender, no erythema  Gait normal with good balance and coordination.  MSK: Patient's left knee still shows some mild arthritic changes.  Mild crepitus  noted.  Trace effusion noted of the patellofemoral joint. Patient's left shoulder shows some decreased range of motion in all planes.  Patient does have tenderness over the acromioclavicular joint.  5 out of 5 strength of the rotator cuff noted today.  Does have some limited range of motion with internal and external range of motion. Neurovascular intact distally.  Low back exam does have some mild loss of lordosis.  Some tenderness to palpation of the paraspinal musculature right greater than left.  Tightness with FABER test bilaterally right greater than left.  Tightness noted with straight leg test but negative radicular symptoms  [id number]; 15 additional minutes spent for Therapeutic exercises as stated in above notes.  This included exercises focusing on stretching, strengthening, with significant focus on eccentric aspects.   Long term goals include an improvement in range of motion, strength, endurance as well as avoiding reinjury. Patient's frequency would include in 1-2 times a day, 3-5 times a week for a duration of 6-12 weeks. Low back exercises that included:  Pelvic tilt/bracing instruction to focus on control of the pelvic girdle and lower abdominal muscles  Glute strengthening exercises, focusing on proper firing of the glutes without engaging the low back  muscles Proper stretching techniques for maximum relief for the hamstrings, hip flexors, low back and some rotation where tolerated  Proper technique shown and discussed handout in great detail with ATC.  All questions were discussed and answered.     Impression and Recommendations:     The above documentation has been reviewed and is accurate and complete Lyndal Pulley, DO

## 2020-11-17 ENCOUNTER — Other Ambulatory Visit: Payer: Self-pay

## 2020-11-17 DIAGNOSIS — G4733 Obstructive sleep apnea (adult) (pediatric): Secondary | ICD-10-CM | POA: Diagnosis not present

## 2020-11-17 MED ORDER — METOPROLOL SUCCINATE ER 50 MG PO TB24: ORAL_TABLET | ORAL | 3 refills | Status: DC

## 2020-11-18 ENCOUNTER — Encounter: Payer: Self-pay | Admitting: Family Medicine

## 2020-11-18 ENCOUNTER — Ambulatory Visit: Payer: Medicare HMO | Admitting: Family Medicine

## 2020-11-18 ENCOUNTER — Other Ambulatory Visit: Payer: Self-pay

## 2020-11-18 ENCOUNTER — Ambulatory Visit (INDEPENDENT_AMBULATORY_CARE_PROVIDER_SITE_OTHER): Payer: Medicare HMO

## 2020-11-18 VITALS — BP 136/72 | HR 64 | Ht 69.0 in | Wt 246.0 lb

## 2020-11-18 DIAGNOSIS — M19011 Primary osteoarthritis, right shoulder: Secondary | ICD-10-CM | POA: Diagnosis not present

## 2020-11-18 DIAGNOSIS — M545 Low back pain, unspecified: Secondary | ICD-10-CM

## 2020-11-18 DIAGNOSIS — G8929 Other chronic pain: Secondary | ICD-10-CM

## 2020-11-18 DIAGNOSIS — M1712 Unilateral primary osteoarthritis, left knee: Secondary | ICD-10-CM

## 2020-11-18 DIAGNOSIS — M47816 Spondylosis without myelopathy or radiculopathy, lumbar region: Secondary | ICD-10-CM | POA: Diagnosis not present

## 2020-11-18 IMAGING — DX DG LUMBAR SPINE COMPLETE 4+V
5 series · 5 of 5 positions shown · non-contrast
Comparison: Lumbar radiograph [DATE]

CLINICAL DATA: Chronic low back pain. Intermittent low back pain
for 6 months. No known injury.

EXAM:
LUMBAR SPINE - COMPLETE 4+ VIEW

[l-spine ap]
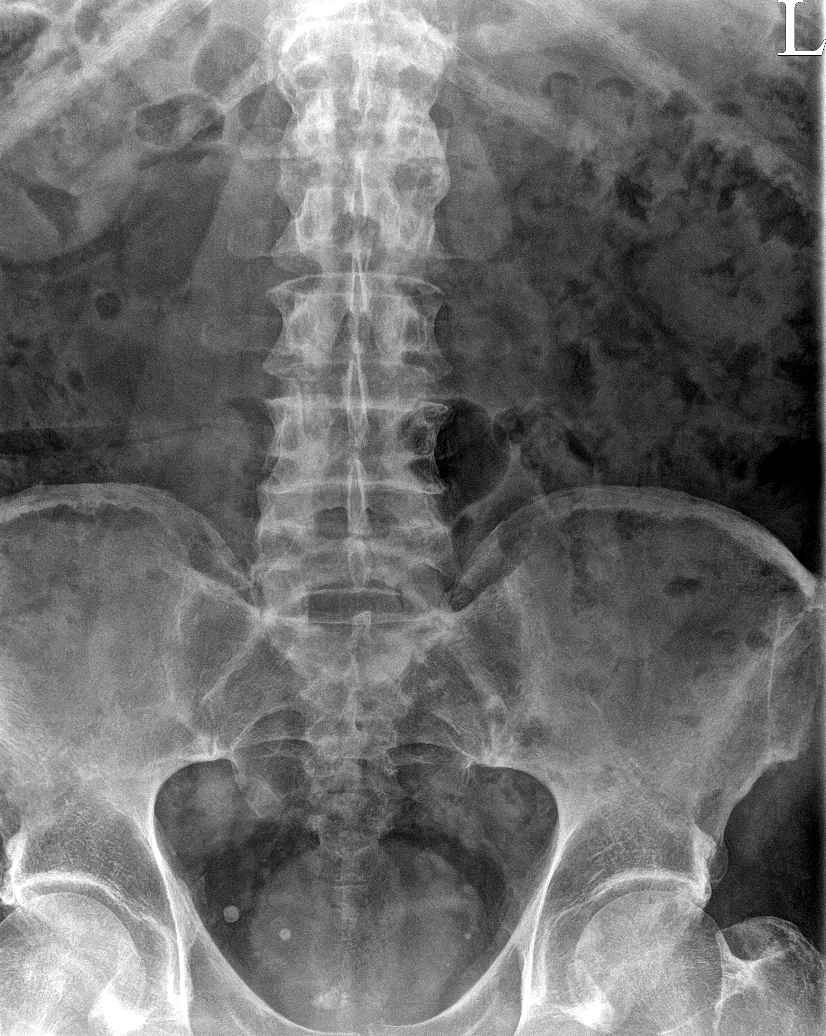

[l-spine obl (1 of 2)]
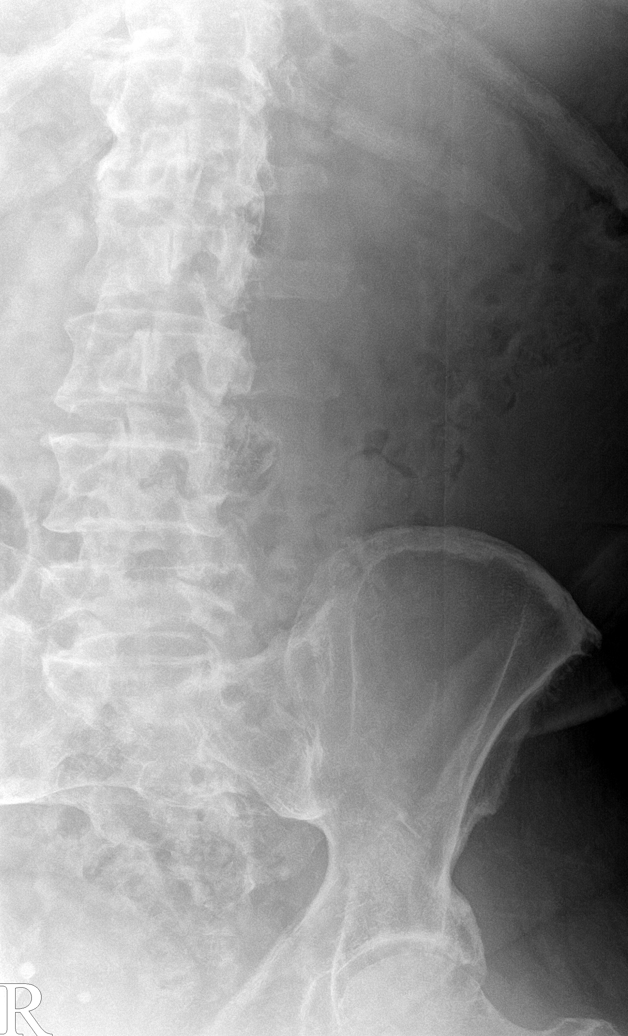

[l-spine obl (2 of 2)]
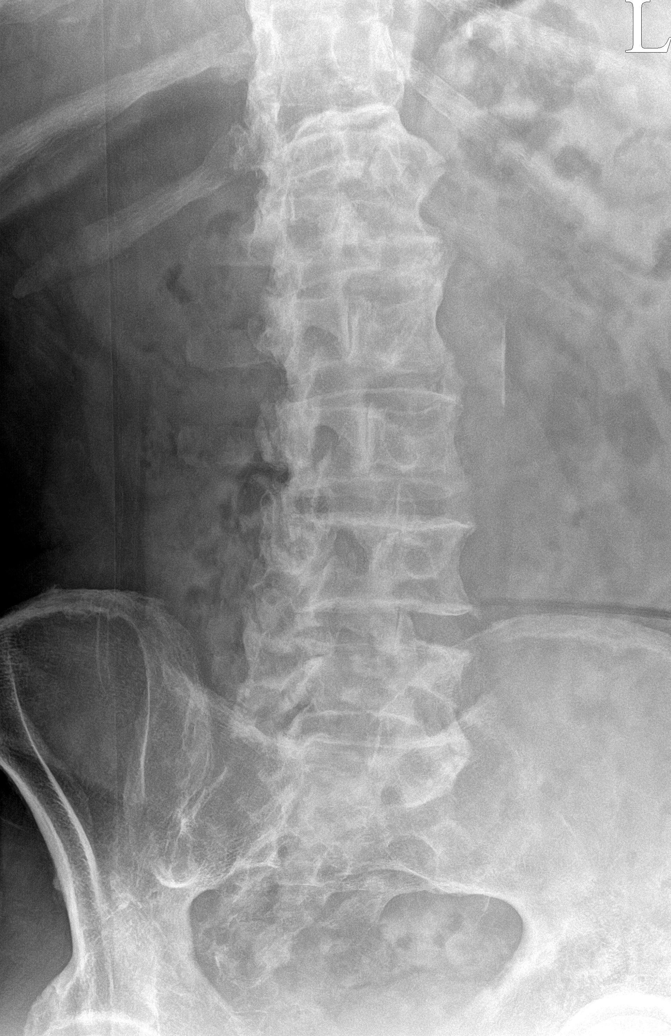

[l-spine lateral]
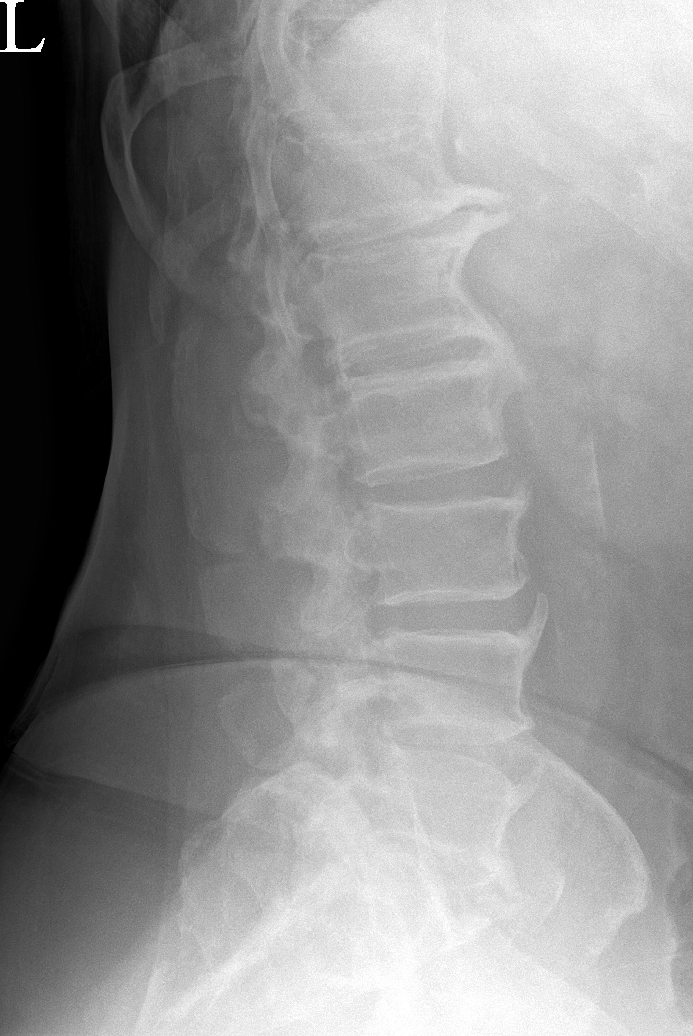

[l-spine spot]
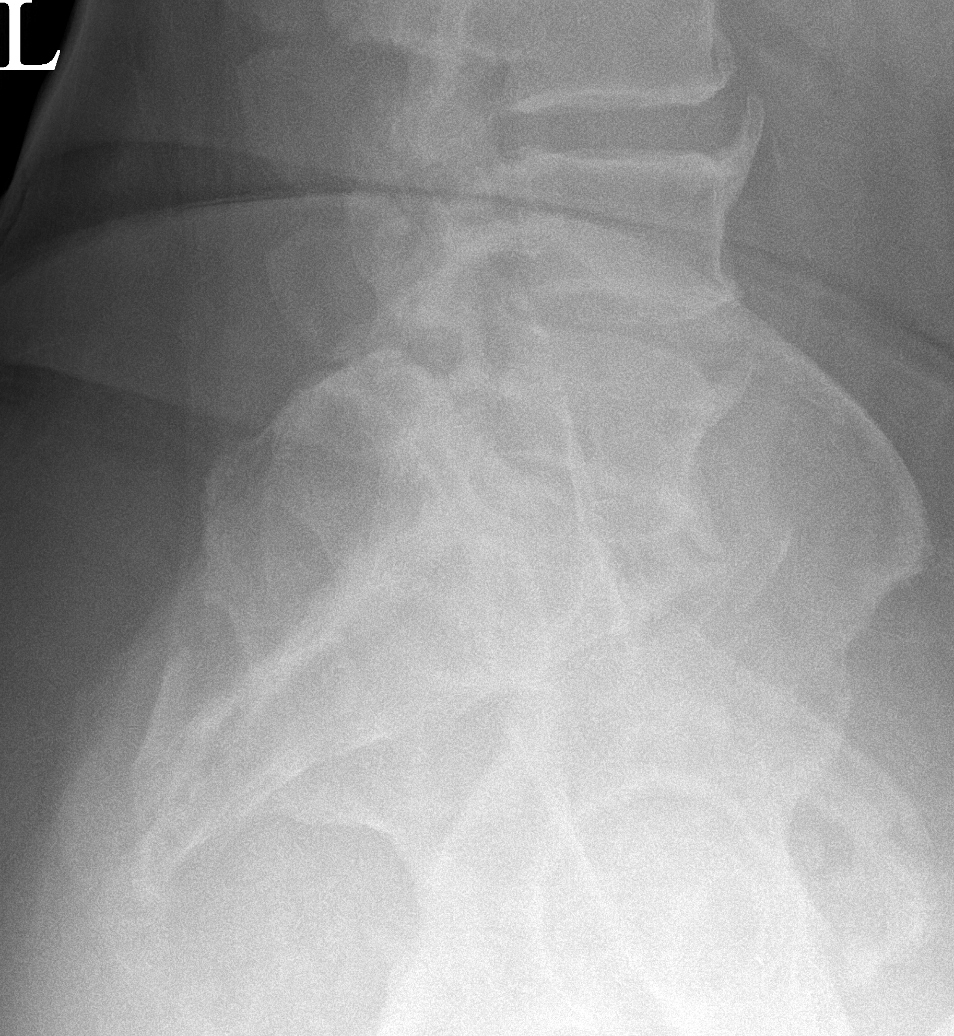

[5 of 5 positions shown; findings below may reference images not displayed]

FINDINGS: There are 5 non-rib-bearing lumbar vertebra. Vertebral body heights
are preserved. Anterior endplate spurring at multiple levels, most
prominently affecting T12-L1. Mild T12-L1 disc space narrowing. Mild
L5-S1 disc space narrowing. Facet hypertrophy throughout, moderate
to advanced at L3-L4 and L4-L5. No evidence of fracture, focal bone
lesion or bone destruction. There is degenerative change of both
sacroiliac joints. Aortic atherosclerosis.
IMPRESSION: 1. Multilevel lumbar spondylosis, similar in appearance to [DT].
2. Multilevel facet hypertrophy, moderate to advanced at L3-L4 and
L4-L5.
3. Multilevel anterior endplate spurring. Mild T12-L1 and L5-S1 disc
space narrowing.

## 2020-11-18 NOTE — Assessment & Plan Note (Signed)
Patient has done relatively well at this time.  Can consider the possibility of injection again and would also consider the viscosupplementation and patient did well.  We will continue to monitor and encourage patient to continue to stay active

## 2020-11-18 NOTE — Patient Instructions (Addendum)
Good to see you Lumbar xray today Spenco orthotics "total support" Back exercises Shoulder seems to be doing well if it worsens MRI See me again in 2-3 months

## 2020-11-18 NOTE — Assessment & Plan Note (Signed)
Patient has had intermittent pain over the years of his lower back.  We will get repeat x-rays to further evaluate.  Home exercises given today.  Discussed avoiding certain activities.  Discussed the importance of core strengthening.  Follow-up again in 6 to 8 weeks

## 2020-11-18 NOTE — Assessment & Plan Note (Signed)
Known arthritic changes.  We discussed again about the possibility of advanced imaging secondary to the longevity of the pain in the left shoulder.  This is intermittent.  Patient denies any weakness.  We discussed MRI which patient declined because he does not feel like it would change any medical management.  Patient will follow up again in 6 to 8 weeks

## 2021-01-12 DIAGNOSIS — H35373 Puckering of macula, bilateral: Secondary | ICD-10-CM | POA: Diagnosis not present

## 2021-01-12 DIAGNOSIS — E119 Type 2 diabetes mellitus without complications: Secondary | ICD-10-CM | POA: Diagnosis not present

## 2021-01-12 DIAGNOSIS — H2513 Age-related nuclear cataract, bilateral: Secondary | ICD-10-CM | POA: Diagnosis not present

## 2021-01-12 LAB — HM DIABETES EYE EXAM

## 2021-01-30 ENCOUNTER — Other Ambulatory Visit: Payer: Self-pay | Admitting: Cardiovascular Disease

## 2021-01-30 DIAGNOSIS — R931 Abnormal findings on diagnostic imaging of heart and coronary circulation: Secondary | ICD-10-CM

## 2021-01-30 DIAGNOSIS — E7849 Other hyperlipidemia: Secondary | ICD-10-CM

## 2021-02-07 DIAGNOSIS — H35371 Puckering of macula, right eye: Secondary | ICD-10-CM | POA: Diagnosis not present

## 2021-02-07 DIAGNOSIS — H43811 Vitreous degeneration, right eye: Secondary | ICD-10-CM | POA: Diagnosis not present

## 2021-02-07 DIAGNOSIS — H2513 Age-related nuclear cataract, bilateral: Secondary | ICD-10-CM | POA: Diagnosis not present

## 2021-02-13 NOTE — Progress Notes (Signed)
[name] [address] [place] Phone: [phone] Subjective:   [name], am serving as a scribe for [name]. This visit occurred during the SARS-CoV-2 public health emergency.  Safety protocols were in place, including screening questions prior to the visit, additional usage of staff PPE, and extensive cleaning of exam room while observing appropriate contact time as indicated for disinfecting solutions.   I'm seeing this patient by the request  of:  [name], MD  CC: Back and shoulder pain follow-up  STM:[id number]  11/18/2020 Patient has had intermittent pain over the years of his lower back.  We will get repeat x-rays to further evaluate.  Home exercises given today.  Discussed avoiding certain activities.  Discussed the importance of core strengthening.  Follow-up again in 6 to 8 weeks  Patient has done relatively well at this time.  Can consider the possibility of injection again and would also consider the viscosupplementation and patient did well.  We will continue to monitor and encourage patient to continue to stay active  Known arthritic changes.  We discussed again about the possibility of advanced imaging secondary to the longevity of the pain in the left shoulder.  This is intermittent.  Patient denies any weakness.  We discussed MRI which patient declined because he does not feel like it would change any medical management.  Patient will follow up again in 6 to 8 weeks  Update 02/14/2021 [name] is a 75 y.o. male coming in with complaint of back and L shoulder pain. Patient states back and shoulder pain remain the same. Both are not a constant pain, but flare up when agitated. IR and 90 degree external rotation is when he feels it the most.   Xray 11/18/2020 lumbar IMPRESSION: 1. Multilevel lumbar spondylosis, similar in appearance to 2018. 2. Multilevel facet hypertrophy, moderate to advanced at  L3-L4 and L4-L5. 3. Multilevel anterior endplate spurring. Mild T12-L1 and L5-S1 disc space narrowing.     Past Medical History:  Diagnosis Date   BPH (benign prostatic hypertrophy)    Cancer ([place])    Basal cell of face, [name]   Diverticulosis    Rosanna Randy syndrome    Hyperglycemia    Hyperlipidemia    NMR Lipoprofile 2005; LDL 146 (2007/1330), HDL 38, TG 169.  LDL goal = <100; Framingham Study LDL goal =<130   Hypertension    OSA on CPAP    Tubular adenoma of colon 01/19/08   Past Surgical History:  Procedure Laterality Date   COLONOSCOPY  2014   neg; [facility]   COLONOSCOPY W/ POLYPECTOMY  2004 & 2009   Tics; [facility]   KNEE ARTHROSCOPY     [name], left knee   TONSILLECTOMY     WISDOM TOOTH EXTRACTION     Social History   Socioeconomic History   Marital status: Married    Spouse name: Not on file   Number of children: 2   Years of education: 18   Highest education level: Not on file  Occupational History   Occupation: Pharmacist, hospital // Retired    Fish farm manager: [place]  Tobacco Use   Smoking status: Never   Smokeless tobacco: Never  Vaping Use   Vaping Use: Never used  Substance and Sexual Activity   Alcohol use: Yes    Alcohol/week: 0.0 standard drinks    Comment: socially, 3-5 beers/week   Drug use: No  Sexual activity: Yes  Other Topics Concern   Not on file  Social History Narrative   Fun: Travel, read, movies, concerts    Denies abuse and feels safe at home.    Social Determinants of Health   Financial Resource Strain: Not on file  Food Insecurity: Not on file  Transportation Needs: Not on file  Physical Activity: Not on file  Stress: Not on file  Social Connections: Not on file   No Known Allergies Family History  Problem Relation Age of Onset   Diverticulosis Mother    Stroke Mother        late 73s   Parkinsonism Mother    Diabetes Father    Hypertension Father    Heart disease Father 40        CABG   Cancer Brother        lymphoma   Appendicitis Brother        ruptured   Asthma Brother        childhood   Stroke Paternal Grandmother 1   Heart attack Maternal Grandmother 80       questionable   Heart attack Maternal Grandfather 77   Parkinsonism Maternal Grandfather    Hypertension Sister    Appendicitis Sister        ruptured   Colon cancer Neg Hx    Esophageal cancer Neg Hx    Rectal cancer Neg Hx    Stomach cancer Neg Hx      Current Outpatient Medications (Cardiovascular):    atorvastatin (LIPITOR) 10 MG tablet, TAKE 1 TABLET EVERY OTHER DAY   metoprolol succinate (TOPROL-XL) 50 MG 24 hr tablet, TAKE 1 TABLET ONCE DAILY. TAKE WITH OR IMMEDIATELY FOLLOWING A MEAL.   nitroGLYCERIN (NITROSTAT) 0.4 MG SL tablet, Place 1 tablet (0.4 mg total) under the tongue every 5 (five) minutes as needed for chest pain.  Current Outpatient Medications (Respiratory):    azelastine (ASTELIN) 0.1 % nasal spray, Place into the nose.  Current Outpatient Medications (Analgesics):    aspirin 81 MG tablet, Take 81 mg by mouth daily.   naproxen sodium (ALEVE) 220 MG tablet, Take 220 mg by mouth daily as needed.  Current Outpatient Medications (Hematological):    Cyanocobalamin (VITAMIN B-12 PO), Take by mouth as directed.  Current Outpatient Medications (Other):    Cholecalciferol (VITAMIN D3) 1000 UNITS CAPS, Take 1 capsule by mouth daily.   Multiple Vitamin (MULTIVITAMIN) tablet, Take 1 tablet by mouth daily.   Turmeric (QC TUMERIC COMPLEX PO), Take by mouth daily.   Reviewed prior external information including notes and imaging from  primary care provider As well as notes that were available from care everywhere and other healthcare systems.  Past medical history, social, surgical and family history all reviewed in electronic medical record.  No pertanent information unless stated regarding to the chief complaint.   Review of Systems:  No headache, visual changes, nausea,  vomiting, diarrhea, constipation, dizziness, abdominal pain, skin rash, fevers, chills, night sweats, weight loss, swollen lymph nodes,  joint swelling, chest pain, shortness of breath, mood changes. POSITIVE muscle aches, body aches  Objective  Blood pressure 140/76, pulse 62, height 5\' 9"  (1.753 m), weight 253 lb (114.8 kg), SpO2 98 %.   General: No apparent distress alert and oriented x3 mood and affect normal, dressed appropriately.  HEENT: Pupils equal, extraocular movements intact  Respiratory: Patient's speak in full sentences and does not appear short of breath  Cardiovascular: No lower extremity edema, non tender, no erythema  Gait normal with good balance and coordination.  MSK: Left shoulder examination the patient actually does have decreased range of motion especially with internal range of motion.  Rotator cuff strength is 4-5 and is showing some weakness compared to the contralateral side at the moment.  This is significantly worse from previous exam.  Does have some limited abduction of the shoulder as well.  Low back exam also has significant loss of lordosis.  Tightness with straight leg test with mild radicular symptoms.  Worsening range of motion as well with increasing pain with greater than extension of 5 degrees.  4-5 weakness of the lower extremities with some mild atrophy noted of the quadriceps bilaterally.     Impression and Recommendations:    The above documentation has been reviewed and is accurate and complete Lyndal Pulley, DO

## 2021-02-14 ENCOUNTER — Other Ambulatory Visit: Payer: Self-pay

## 2021-02-14 ENCOUNTER — Ambulatory Visit: Payer: Medicare HMO | Admitting: Family Medicine

## 2021-02-14 ENCOUNTER — Encounter: Payer: Self-pay | Admitting: Family Medicine

## 2021-02-14 VITALS — BP 140/76 | HR 62 | Ht 69.0 in | Wt 253.0 lb

## 2021-02-14 DIAGNOSIS — M25512 Pain in left shoulder: Secondary | ICD-10-CM | POA: Diagnosis not present

## 2021-02-14 DIAGNOSIS — M545 Low back pain, unspecified: Secondary | ICD-10-CM

## 2021-02-14 DIAGNOSIS — G8929 Other chronic pain: Secondary | ICD-10-CM

## 2021-02-14 NOTE — Assessment & Plan Note (Signed)
Lower back pain that he has had for years.  Patient's x-rays have shown some mild progression noted especially with the advanced L3-L4 and L4-L5 arthropathy and degenerative disc disease patient has had this pain for 4 years but slowly progressing and not responding to conservative therapy.  Do feel MRI could be beneficial to further evaluate depending on findings we can discuss the possibility of epidurals but we do think that ankle pain avoid any surgical intervention.  If patient has any worsening pain, bowel or bladder incontinence, or with depending on findings we will discuss further management.

## 2021-02-14 NOTE — Assessment & Plan Note (Signed)
Patient is having increasing limited range of motion.  Patient states that the pain is starting to affect daily activities.  Patient does have a mild positive empty can which is new as well.  Discussed with patient again at great length.  I discussed icing regimen and home exercises.  Discussed which activities to do which wants to avoid.  We did discuss that with patient failing all other conservative therapy including formal physical therapy I do feel that MRI, MR arthrogram would be necessary at this time.  Follow-up with me after imaging to discuss further.

## 2021-02-14 NOTE — Patient Instructions (Addendum)
MRI lumbar  MRA L shoulder [phone]

## 2021-02-23 ENCOUNTER — Encounter: Payer: Self-pay | Admitting: Internal Medicine

## 2021-02-23 NOTE — Progress Notes (Signed)
Outside notes received. Information abstracted. Notes sent to scan.  

## 2021-02-24 NOTE — Progress Notes (Signed)
Patient ID: Edwin [name], male   DOB: 1945-12-17, 75 y.o.   MRN: [id number]     Cardiology Office Note   Date:  03/10/2021   ID:  Edwin [name] 10-Dec-1945, MRN [id number]  PCP:  Edwin Rail, MD  Cardiologist:   Edwin Rouge, MD   No chief complaint on file.     History of Present Illness: [place] [name] is a 75 y.o. male initially seen for abnormal ECG 09/2014    Had ETT with PA in April Exercised for about 7 mintues Baseline ECG with RBBB with nonspecific ST changes  Should not have had treadmill with baseline abnormal ECG  As these were accentuated with stress No VT. CRF;s HTN on Rx including statin  Sleep apnea   Wears CPAP  .    09/11/14 cardiac CT showed very high calcium score 1010 which was 91 st percentile with moderate mid LAD/RCA disease Last non ischemic myovue Was done 12/26/17  Likes to travel to [name] with grand kids and has been to [facility] Dr [place] for chronic back and shoulder pain Has had injections   No cardiac complaints Compliant with his meds and CPAP   Seeing grand kids in [place] and [place]    Past Medical History:  Diagnosis Date   BPH (benign prostatic hypertrophy)    Cancer ([place])    Basal cell of face, Dr. Wilhemina [name]   Diverticulosis    [name] syndrome    Hyperglycemia    Hyperlipidemia    NMR Lipoprofile 2005; LDL 146 (2007/1330), HDL 38, TG 169.  LDL goal = <100; Framingham Study LDL goal =<130   Hypertension    OSA on CPAP    Tubular adenoma of colon 01/19/08    Past Surgical History:  Procedure Laterality Date   COLONOSCOPY  2014   neg; [facility]   COLONOSCOPY W/ POLYPECTOMY  2004 & 2009   Tics; [name]   KNEE ARTHROSCOPY     Dr.Andy [name], left knee   TONSILLECTOMY     WISDOM TOOTH EXTRACTION       Current Outpatient Medications  Medication Sig Dispense Refill   aspirin 81 MG tablet Take 81 mg by mouth daily.     atorvastatin (LIPITOR) 10 MG tablet TAKE 1 TABLET EVERY OTHER DAY 45 tablet 0    azelastine (ASTELIN) 0.1 % nasal spray Place into the nose.     Cholecalciferol (VITAMIN D3) 1000 UNITS CAPS Take 1 capsule by mouth daily.     Cyanocobalamin (VITAMIN B-12 PO) Take by mouth as directed.     metoprolol succinate (TOPROL-XL) 50 MG 24 hr tablet TAKE 1 TABLET ONCE DAILY. TAKE WITH OR IMMEDIATELY FOLLOWING A MEAL. 90 tablet 3   Multiple Vitamin (MULTIVITAMIN) tablet Take 1 tablet by mouth daily.     naproxen sodium (ALEVE) 220 MG tablet Take 220 mg by mouth daily as needed.     nitroGLYCERIN (NITROSTAT) 0.4 MG SL tablet Place 1 tablet (0.4 mg total) under the tongue every 5 (five) minutes as needed for chest pain. 25 tablet 3   Turmeric (QC TUMERIC COMPLEX PO) Take by mouth daily.     No current facility-administered medications for this visit.    Allergies:   Patient has no known allergies.    Social History:  The patient  reports that he has never smoked. He has never used smokeless tobacco. He reports current alcohol use. He reports that he does not use drugs.   Family  History:  The patient's family history includes Appendicitis in his brother and sister; Asthma in his brother; Cancer in his brother; Diabetes in his father; Diverticulosis in his mother; Heart attack (age of onset: 72) in his maternal grandfather; Heart attack (age of onset: 23) in his maternal grandmother; Heart disease (age of onset: 75) in his father; Hypertension in his father and sister; Parkinsonism in his maternal grandfather and mother; Stroke in his mother; Stroke (age of onset: 31) in his paternal grandmother.    ROS:  Please see the history of present illness.   Otherwise, review of systems are positive for none.   All other systems are reviewed and negative.      PHYSICAL EXAM: VS:  BP (!) 144/86   Pulse 65   Ht 5\' 9"  (1.753 m)   Wt 256 lb (116.1 kg)   SpO2 98%   BMI 37.80 kg/m  , BMI Body mass index is 37.8 kg/m. Affect appropriate Healthy:  appears stated age 50: normal Neck supple  with no adenopathy JVP normal no bruits no thyromegaly Lungs clear with no wheezing and good diaphragmatic motion Heart:  S1/S2 no murmur, no rub, gallop or click PMI normal Abdomen: benighn, BS positve, no tenderness, no AAA no bruit.  No HSM or HJR Distal pulses intact with no bruits No edema Neuro non-focal Skin warm and dry No muscular weakness   EKG:   03/02/20  SR rate 60 RBBB no acute changes 03/10/2021 SR rate 65 RBBB    Recent Labs: 11/01/2020: ALT 23; BUN 21; Creatinine, [name] 0.96; Hemoglobin 15.4; Platelets 164.0; Potassium 4.1; Sodium 141; TSH 5.63    Lipid Panel    Component Value Date/Time   CHOL 141 11/01/2020 0746   CHOL 202 (H) 06/02/2014 1116   TRIG 128.0 11/01/2020 0746   TRIG 213 (H) 06/02/2014 1116   TRIG 179 (H) 05/13/2006 1239   HDL 46.70 11/01/2020 0746   HDL 44 06/02/2014 1116   CHOLHDL 3 11/01/2020 0746   VLDL 25.6 11/01/2020 0746   LDLCALC 69 11/01/2020 0746   LDLCALC 115 (H) 06/02/2014 1116   LDLDIRECT 67.0 02/27/2018 1110      Wt Readings from Last 3 Encounters:  03/10/21 256 lb (116.1 kg)  02/14/21 253 lb (114.8 kg)  11/18/20 246 lb (111.6 kg)      Other studies Reviewed: Additional studies/ records that were reviewed today include: Epic notes  ETT by DR [name]. Myovue and cardiac CT 2016 Myovue 12/26/17    ASSESSMENT AND PLAN:  1. CAD: Moderate LAD/Circ disease by cardiac CT June 2016 Normal perfusion study 12/26/17 continue medical Rx   2. Chol: On statin LDL 69   3. RBBB:  No change on ECG yearly ECG   4. PAC;s  Benign asymptomatic on beta blocker   5. HTN:  Well controlled.  Continue current medications and low sodium Dash type diet.    6. Derm:  Post mow's with basal cell removal left nares no recurrence   7. OSA:  Continue CPAP discussed weight loss strategies   8. DM:  Discussed low carb diet.  Target hemoglobin A1c is 6.5 or less.  Continue current medications. A1c 5.9 on 10/29/19              Disposition:   FU with  me in a year     [name]

## 2021-03-06 ENCOUNTER — Ambulatory Visit
Admission: RE | Admit: 2021-03-06 | Discharge: 2021-03-06 | Disposition: A | Discharge status: Home / Self Care | Payer: Medicare HMO | Source: Ambulatory Visit | Attending: Family Medicine | Admitting: Family Medicine

## 2021-03-06 DIAGNOSIS — M25512 Pain in left shoulder: Secondary | ICD-10-CM | POA: Diagnosis not present

## 2021-03-06 DIAGNOSIS — M545 Low back pain, unspecified: Secondary | ICD-10-CM

## 2021-03-06 DIAGNOSIS — M19012 Primary osteoarthritis, left shoulder: Secondary | ICD-10-CM | POA: Diagnosis not present

## 2021-03-06 DIAGNOSIS — G8929 Other chronic pain: Secondary | ICD-10-CM

## 2021-03-06 DIAGNOSIS — M67814 Other specified disorders of tendon, left shoulder: Secondary | ICD-10-CM | POA: Diagnosis not present

## 2021-03-06 DIAGNOSIS — M48061 Spinal stenosis, lumbar region without neurogenic claudication: Secondary | ICD-10-CM | POA: Diagnosis not present

## 2021-03-06 IMAGING — MR MR SHOULDER*L* W/ CM
4 of 6 series · 14 of 40 positions shown · IV contrast (agent unspecified)
Comparison: Left shoulder x-rays dated [DATE].

CLINICAL DATA: Left shoulder pain and limited range of motion for
the past 6 months. No prior surgery.

EXAM:
MR ARTHROGRAM OF THE LEFT SHOULDER
TECHNIQUE: Multiplanar, multisequence MR imaging of the left shoulder was
performed following the administration of intra-articular contrast.
CONTRAST:  See Injection Documentation.

[Series 8: T1 fat-sat · axial · left · 3.0mm · 0.44mm/px · z∈[-19,+46]mm · 3 of 26 slices shown (1 of 2)]
[im 4/26]
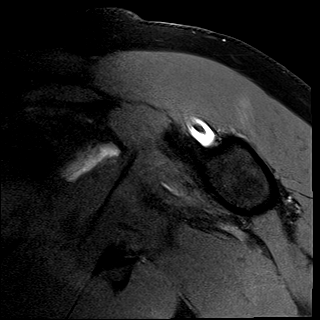
[im 15/26]
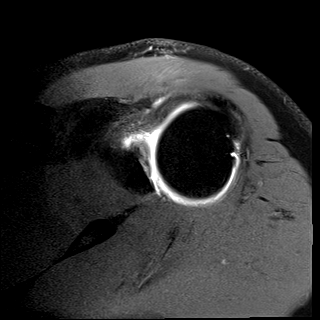
[im 22/26]
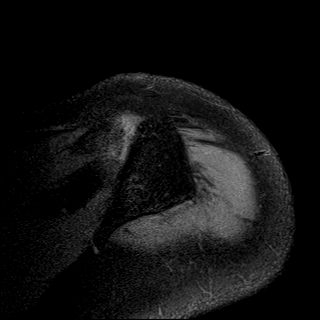

[Series 9: T2 fat-sat · oblique · left · 3.0mm · 0.22mm/px · 5 of 28 slices shown (1 of 2)]
[im 1/28]
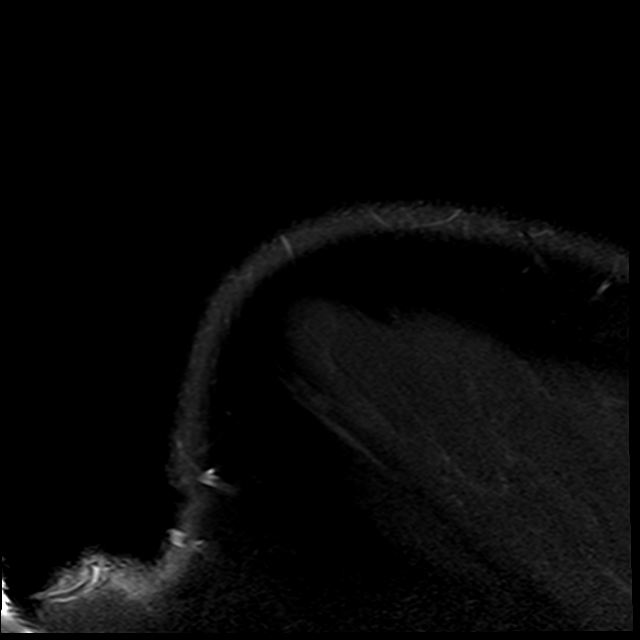
[im 5/28]
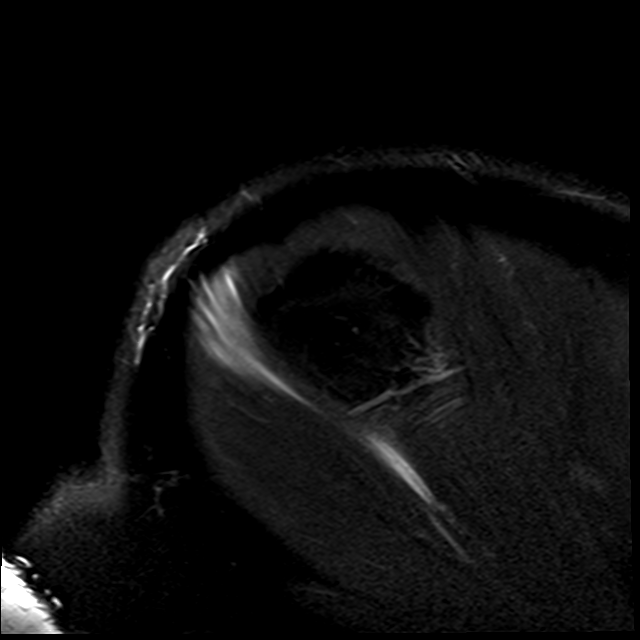
[im 10/28]
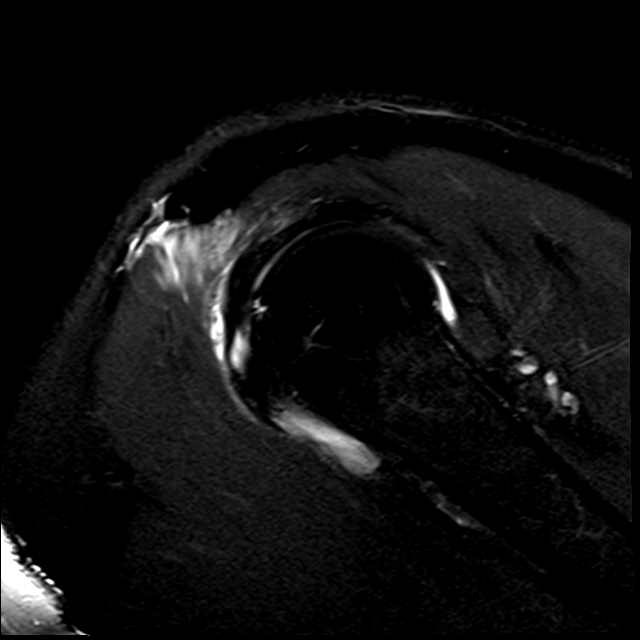
[im 14/28]
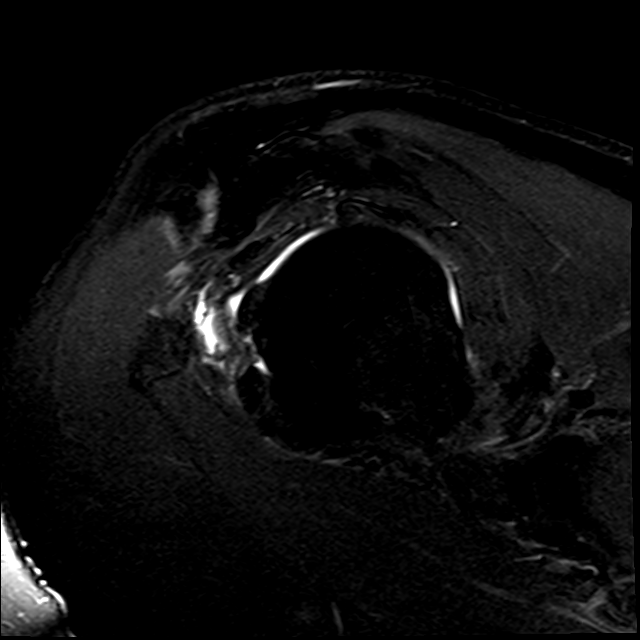
[im 23/28]
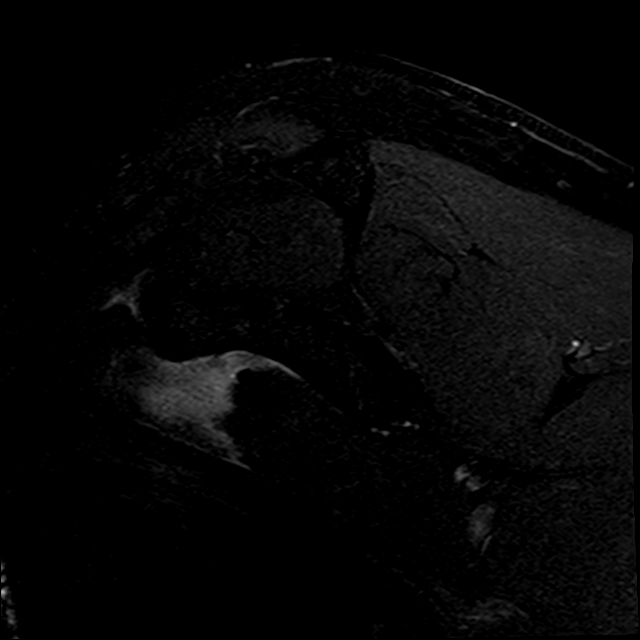

[Series 10: T1 fat-sat · oblique · left · 3.0mm · 0.18mm/px · 3 of 24 slices shown (2 of 2)]
[im 5/24]
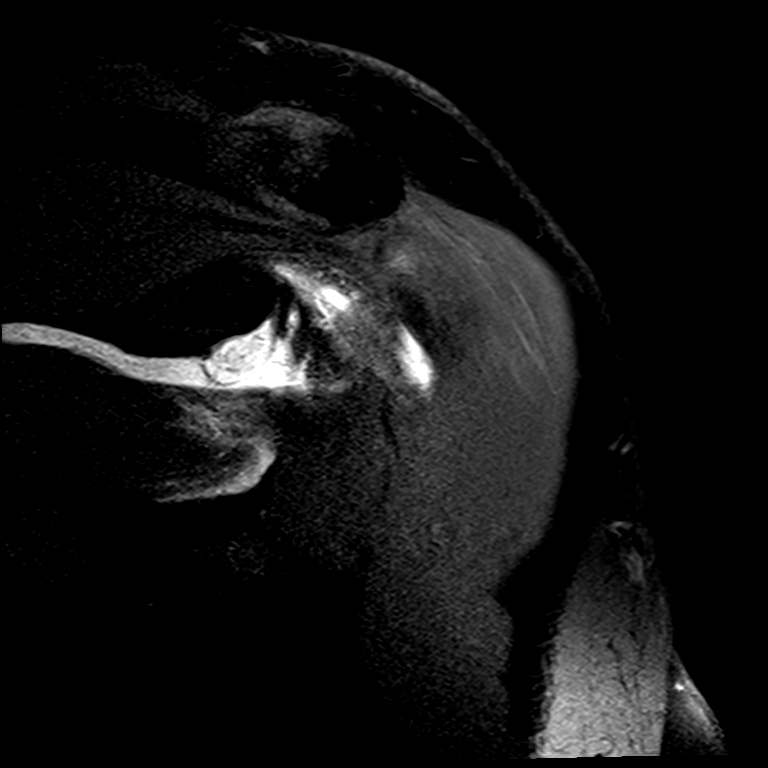
[im 14/24]
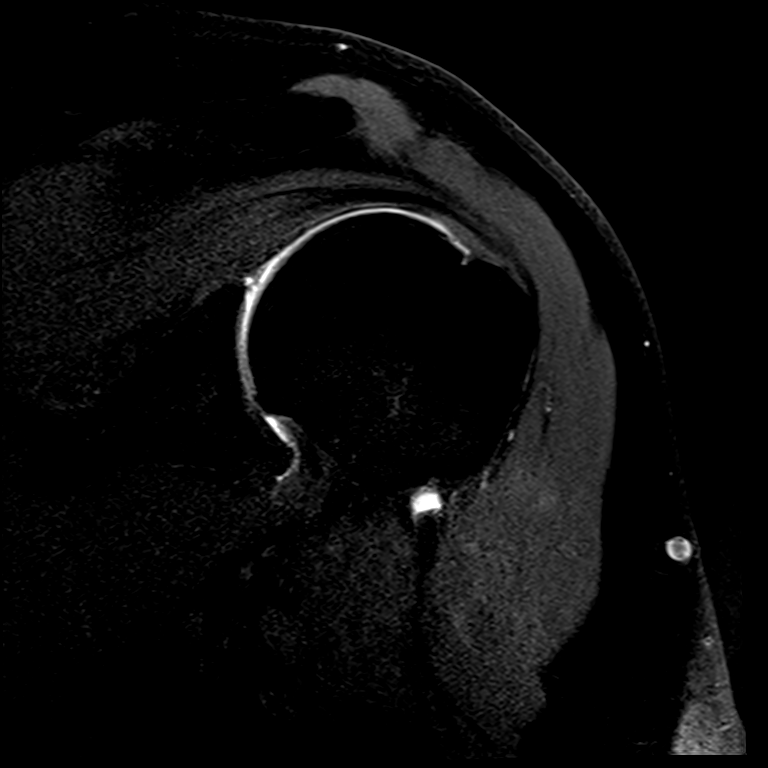
[im 24/24]
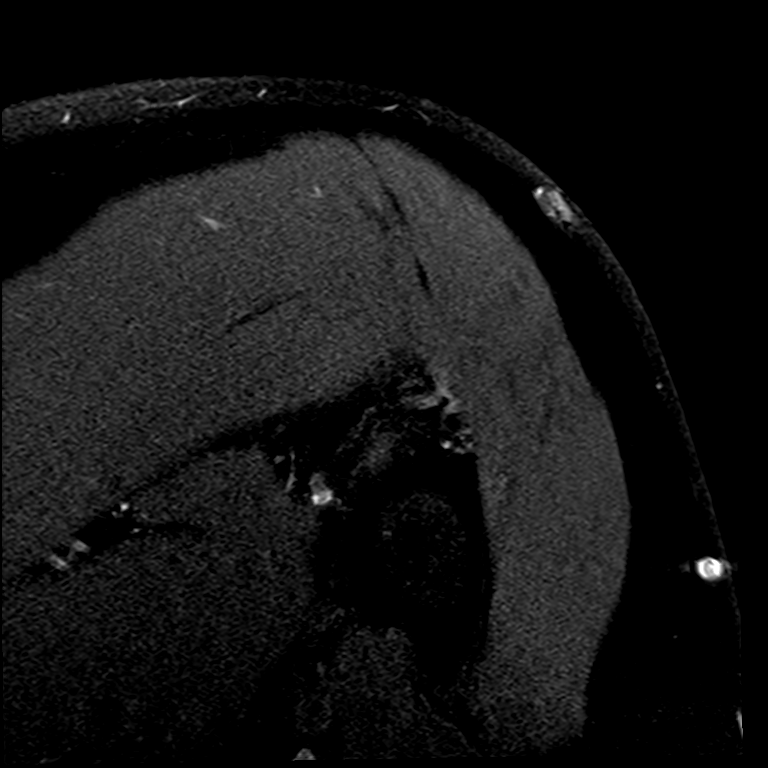

[Series 12: T2 fat-sat · oblique · left · 3.0mm · 0.22mm/px · 3 of 24 slices shown (2 of 2)]
[im 5/24]
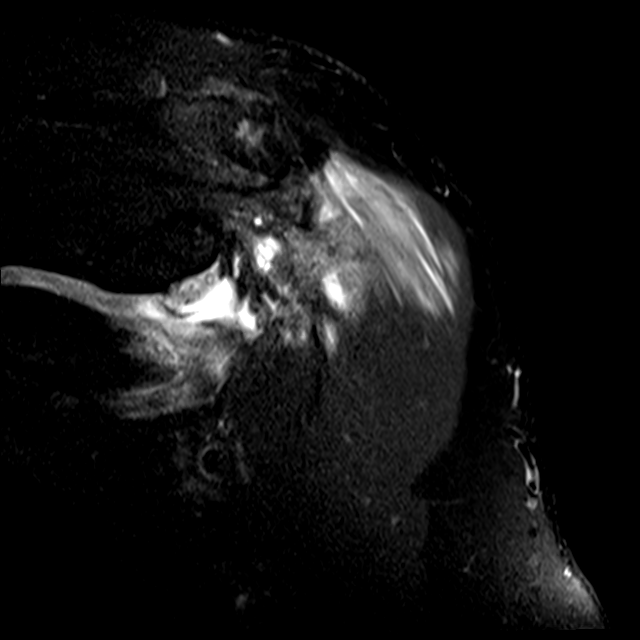
[im 14/24]
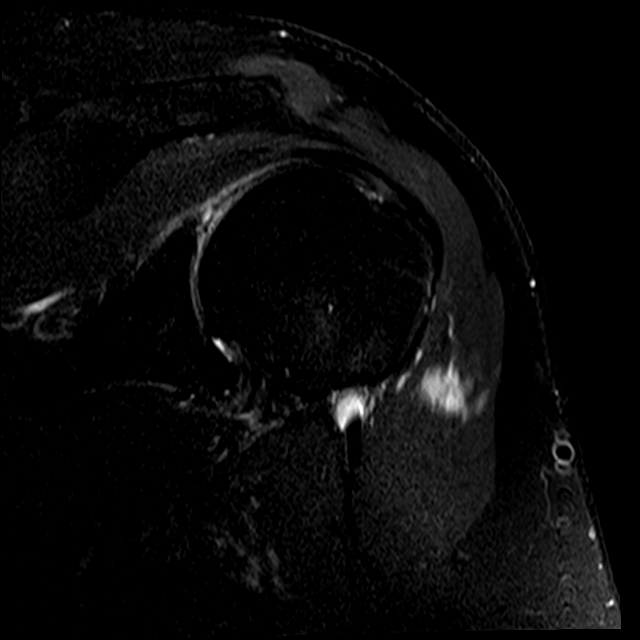
[im 24/24]
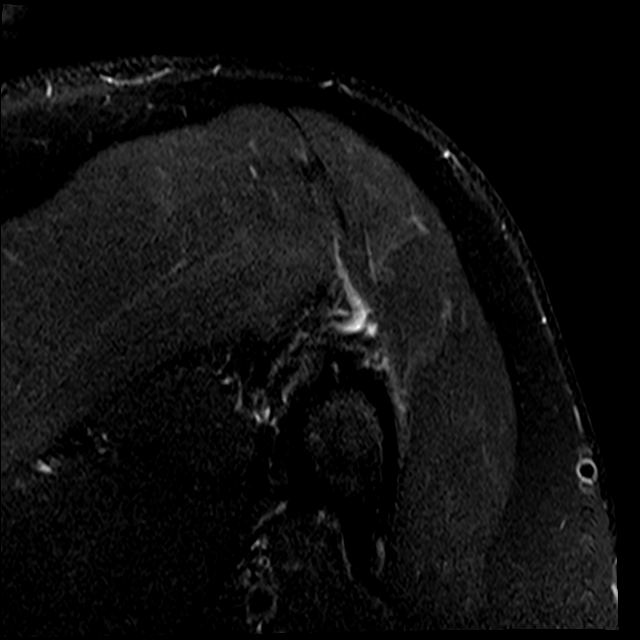

[14 of 40 positions shown; findings below may reference images not displayed]

FINDINGS: Rotator cuff: Intact rotator cuff. Mild supraspinatus and
infraspinatus tendinosis.

Muscles:  No focal muscular atrophy or edema.

Biceps long head:  Intact and normally positioned.

Acromioclavicular Joint: The acromion is type II. There are
mild-to-moderate acromioclavicular degenerative changes. No
significant fluid or contrast is present in the
subacromial/subdeltoid bursa.

Glenohumeral Joint: Distended with intra-articular contrast. Mild
diffuse cartilage thinning without focal defect. Anterior.

Labrum: No evidence of labral tear. Anterior superior sublabral
foramen.

Bones: No acute fracture or dislocation. No suspicious bone lesion.

Other: None.
IMPRESSION: 1. Intact rotator cuff. Mild supraspinatus and infraspinatus
tendinosis.
2. No labral tear.
3. Mild to moderate acromioclavicular and mild glenohumeral
osteoarthritis.

## 2021-03-06 IMAGING — XA DG FLUORO GUIDE NDL PLC/BX
2 series · 2 of 2 positions shown · non-contrast
Comparison: none

CLINICAL DATA: Left shoulder pain with certain positions. No
previous surgery.

EXAM:
EXAM
LEFT SHOULDER INJECTION UNDER FLUOROSCOPY FOR MRI
FLUOROSCOPY TIME:  14 seconds; 8.77 [D0] DAP
TECHNIQUE: The procedure, risks (including but not limited to bleeding,
infection, organ damage ), benefits, and alternatives were explained
to the patient. Questions regarding the procedure were encouraged
and answered. The patient understands and consents to the procedure.

[Series 1: ortho adipose · 1 of 1 slices shown (1 of 2)]
[im 1/1]
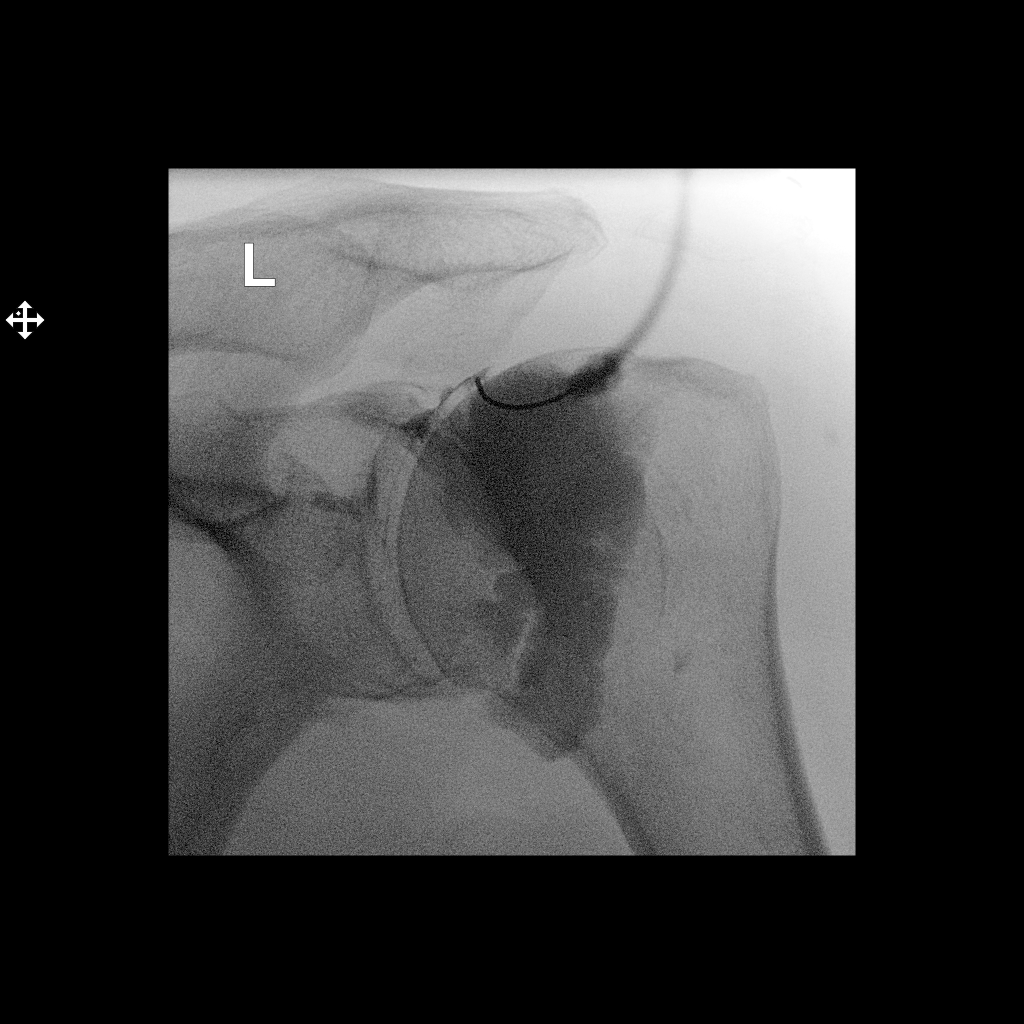

[Series 2: ortho adipose · 1 of 1 slices shown (2 of 2)]
[im 1/1]
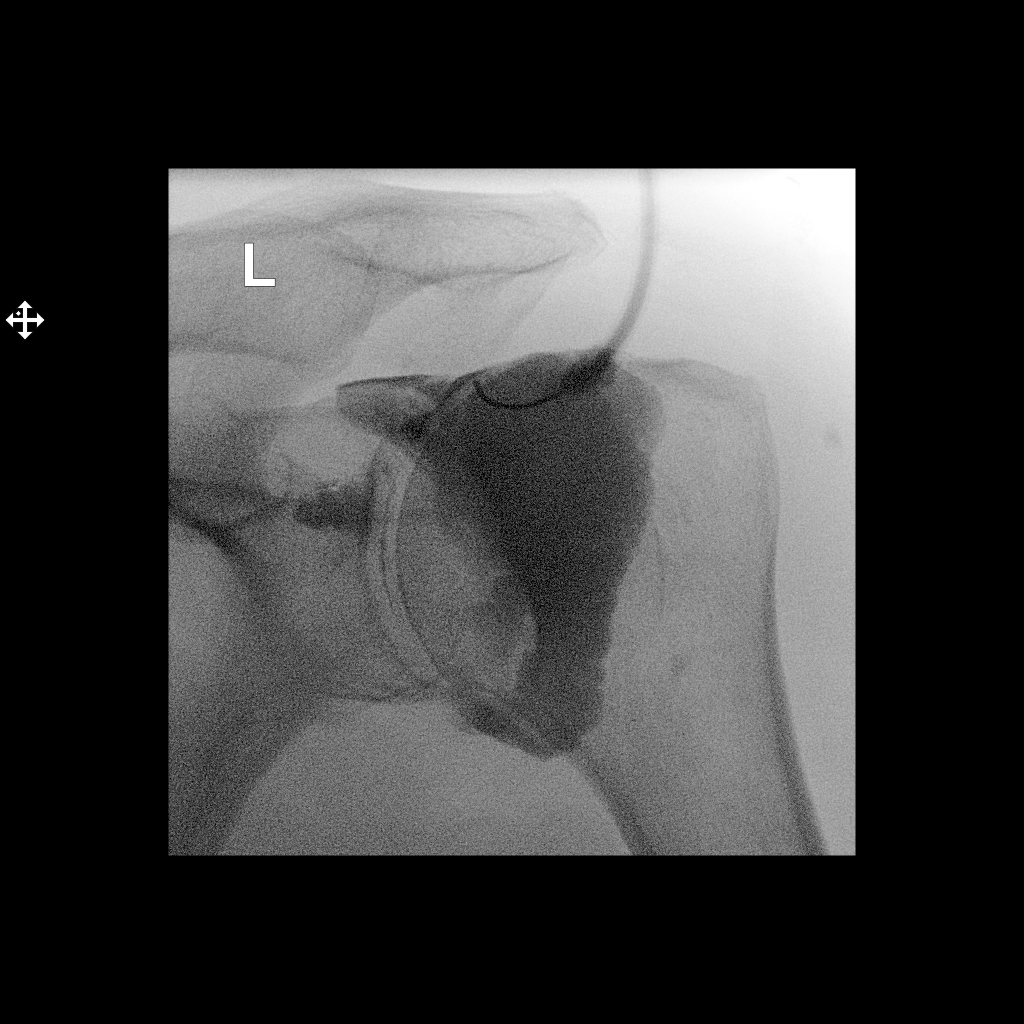

[2 of 2 positions shown; findings below may reference images not displayed]

An appropriate skin entry site was determined under fluoroscopy.
Skin site was marked, prepped with Betadine, and draped in usual
sterile fashion, and infiltrated locally with 1% lidocaine.

22-gauge spinal needle advanced to the superior medial margin of the
humeral head. 1 mL of lidocaine 1% injected easily. 15 ml of a
mixture of 20 mL dilute iodinated contrast with 0.1ml Multihance
contrast was injected into the shoulder joint. Intraarticular flow
was confirmed on fluoroscopy. Patient transferred to MRI.

COMPLICATIONS:
COMPLICATIONS
none
IMPRESSION: 1. Technically successful left shoulder injection for MRI

## 2021-03-06 IMAGING — MR MR LUMBAR SPINE W/O CM
4 of 5 series · 18 of 48 positions shown · non-contrast
Comparison: Lumbar spine x-rays dated [DATE].

CLINICAL DATA: Low back pain for the past 6 months.

EXAM:
MRI LUMBAR SPINE WITHOUT CONTRAST
TECHNIQUE: Multiplanar, multisequence MR imaging of the lumbar spine was
performed. No intravenous contrast was administered.

[Series 6: T2 · sagittal · 4.0mm · 0.78mm/px · 6 of 15 slices shown (1 of 2)]
[im 1/15]
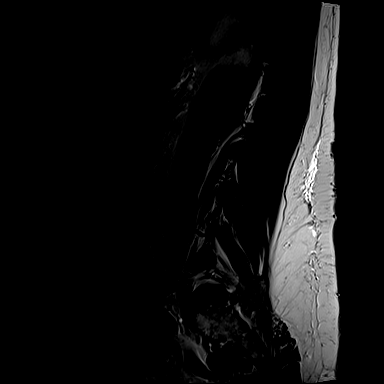
[im 3/15]
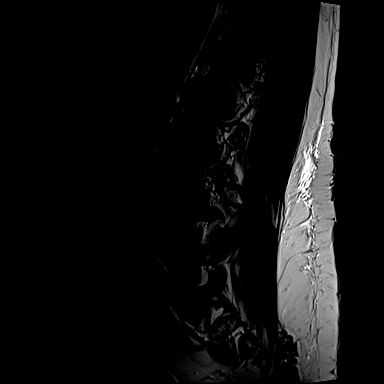
[im 6/15]
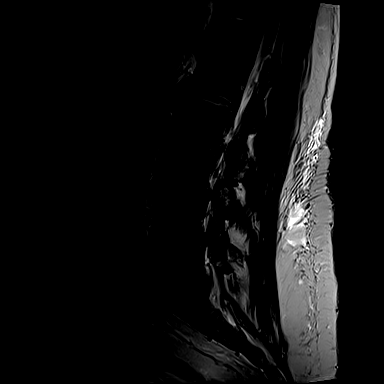
[im 9/15]
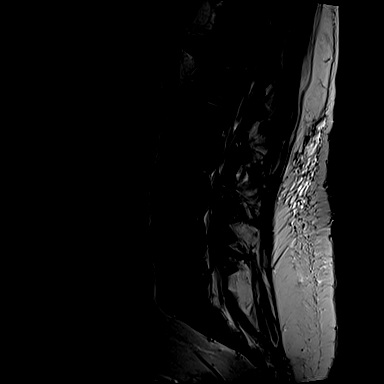
[im 12/15]
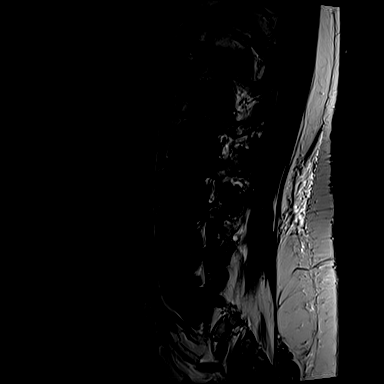
[im 15/15]
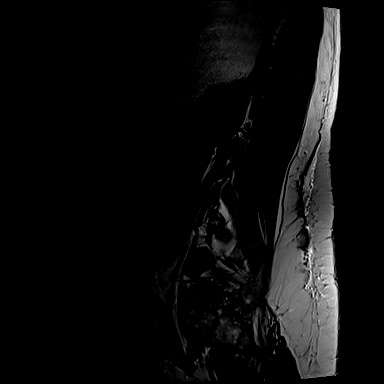

[Series 7: T1 · sagittal · 4.0mm · 0.78mm/px · 3 of 15 slices shown (1 of 2)]
[im 1/15]
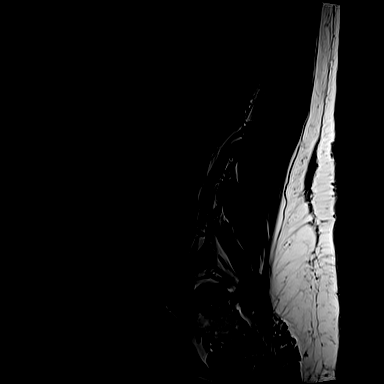
[im 8/15]
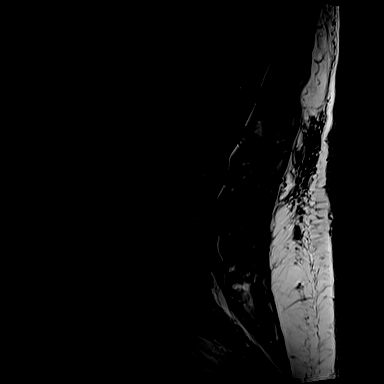
[im 15/15]
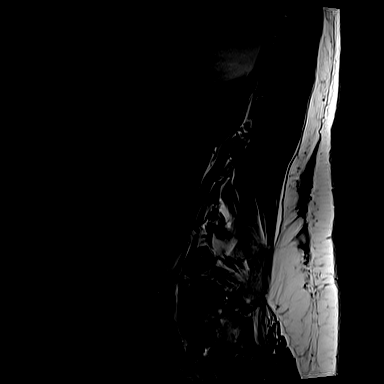

[Series 13: T2 · axial · 4.0mm · 0.28mm/px · z∈[-65,+128]mm · 6 of 44 slices shown (2 of 2)]
[im 3/44]
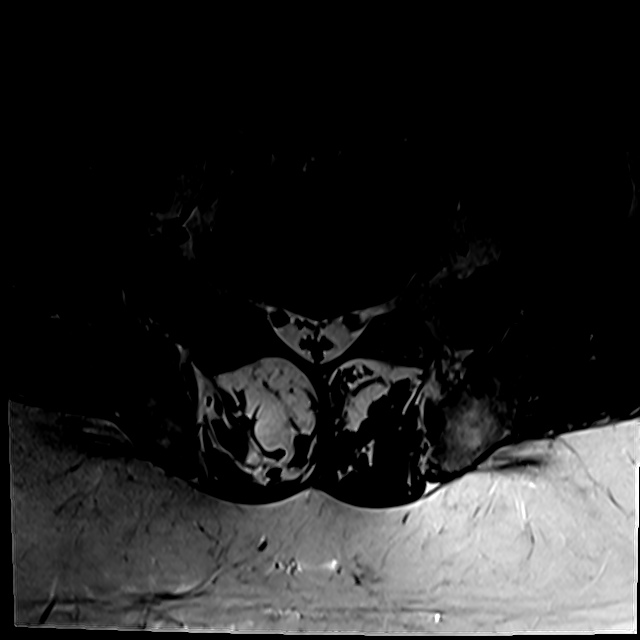
[im 6/44]
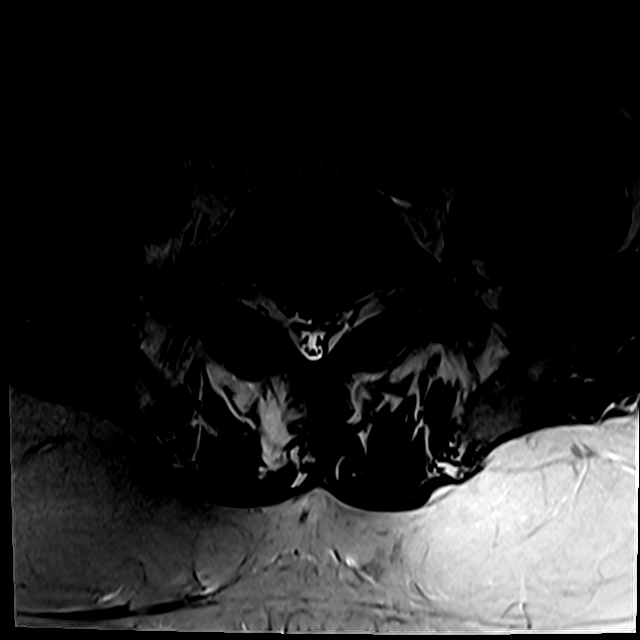
[im 9/44]
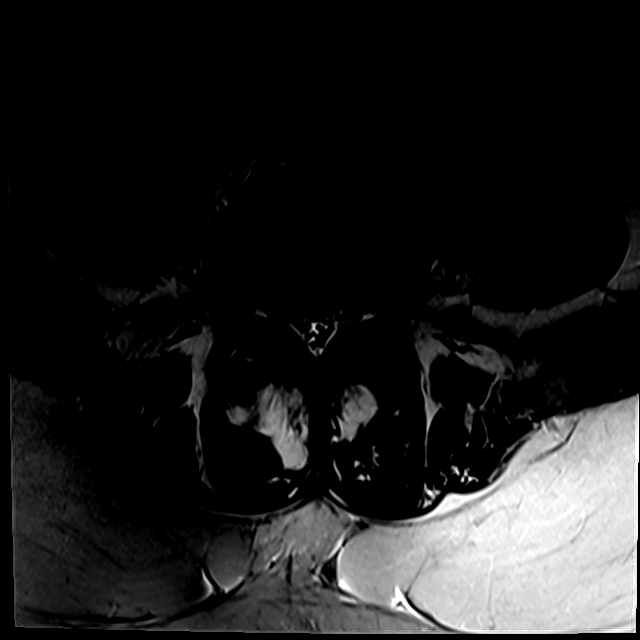
[im 15/44]
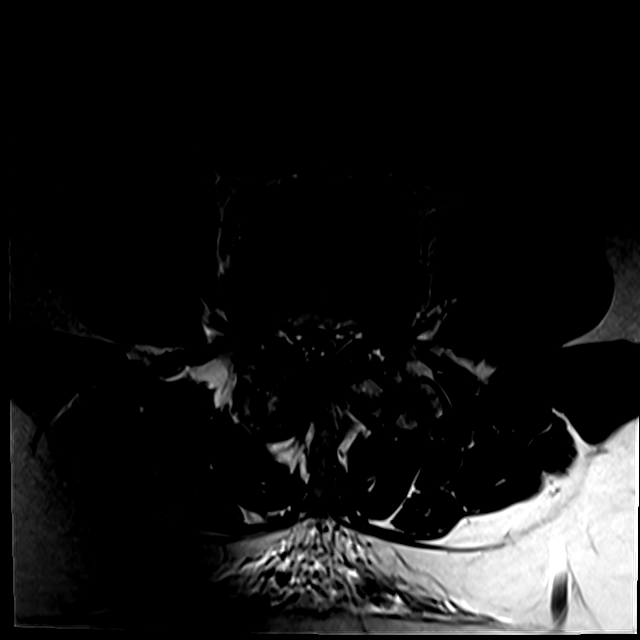
[im 23/44]
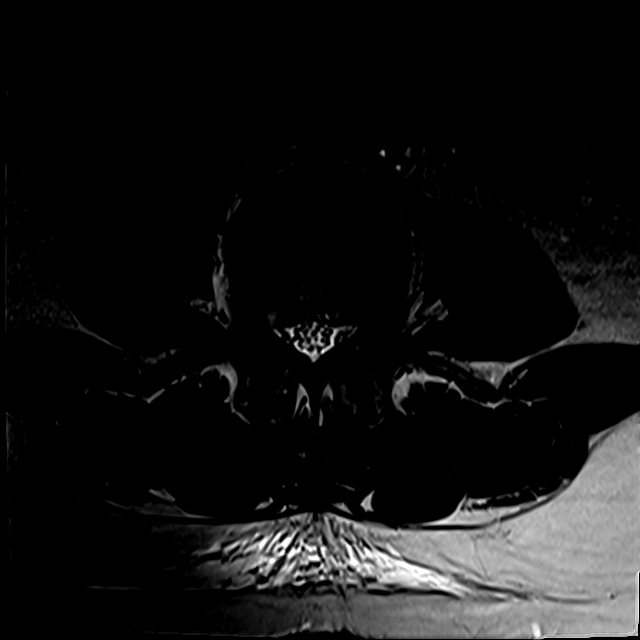
[im 38/44]
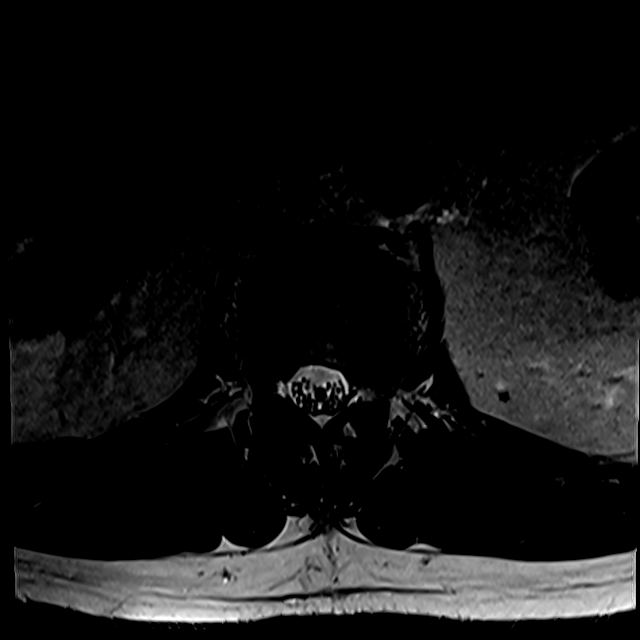

[Series 101: T1 · axial · 4.0mm · 0.28mm/px · z∈[-50,+128]mm · 3 of 44 slices shown (2 of 2)]
[im 6/44]
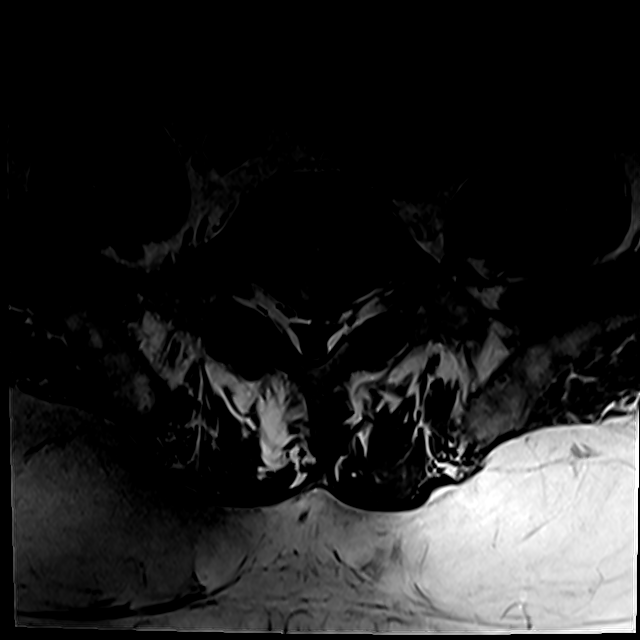
[im 23/44]
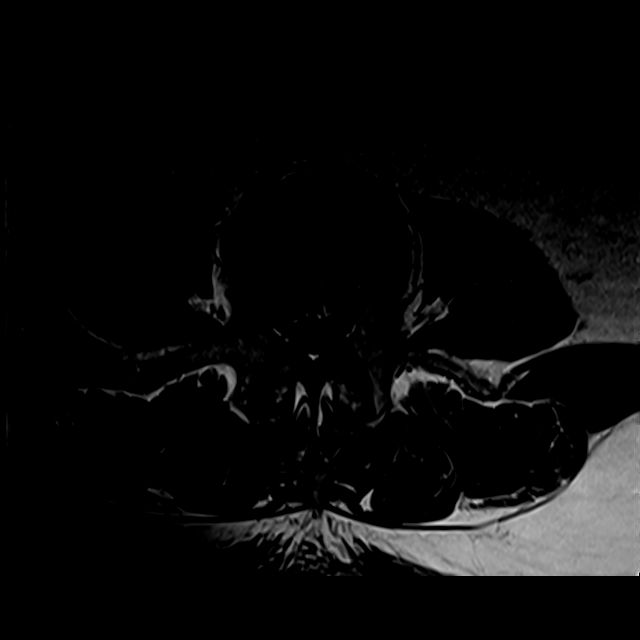
[im 38/44]
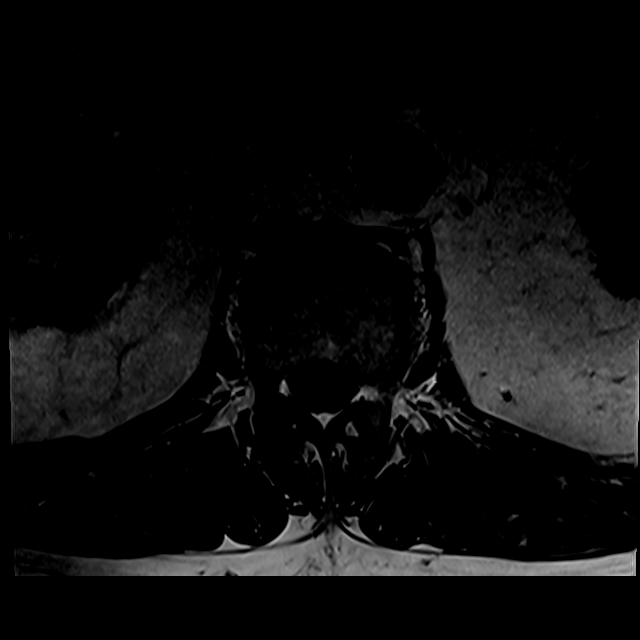

[18 of 48 positions shown; findings below may reference images not displayed]

FINDINGS: Segmentation:  Standard.

Alignment:  Physiologic.

Vertebrae:  No fracture, evidence of discitis, or bone lesion.

Conus medullaris and cauda equina: Conus extends to the L1 level.
Conus and cauda equina appear normal.

Paraspinal and other soft tissues: Negative.

Disc levels:

T12-L1:  No significant disc bulge or herniation.  No stenosis.

L1-L2:  No significant disc bulge or herniation.  No stenosis.

L2-L3: Mild disc bulging and bilateral facet arthropathy. 4 mm
left-sided synovial cyst in the lateral recess. Moderate spinal
canal and left lateral recess stenosis. No neuroforaminal stenosis.

L3-L4: Mild disc bulging. Severe bilateral facet arthropathy with
ligamentum flavum hypertrophy. Severe spinal canal stenosis. No
neuroforaminal stenosis.

L4-L5: Mild disc bulging and moderate bilateral facet arthropathy.
Moderate spinal canal and severe left lateral recess stenosis. No
neuroforaminal stenosis.

L5-S1: Negative disc. Mild bilateral facet arthropathy. No stenosis.
IMPRESSION: 1. Multilevel degenerative changes of the lumbar spine as described
above. Severe spinal canal stenosis at L3-L4.
2. Moderate spinal canal and severe left lateral recess stenosis at
L4-L5.
3. Moderate spinal canal and left lateral recess stenosis at L2-L3.

## 2021-03-06 MED ORDER — IOPAMIDOL (ISOVUE-M 200) INJECTION 41%: 15.0000 mL | Freq: Once | INTRAMUSCULAR | Status: DC

## 2021-03-10 ENCOUNTER — Ambulatory Visit: Payer: Medicare HMO | Admitting: Cardiovascular Disease

## 2021-03-10 ENCOUNTER — Encounter: Payer: Self-pay | Admitting: Family Medicine

## 2021-03-10 ENCOUNTER — Encounter: Payer: Self-pay | Admitting: Cardiovascular Disease

## 2021-03-10 ENCOUNTER — Other Ambulatory Visit: Payer: Self-pay

## 2021-03-10 VITALS — BP 144/86 | HR 65 | Ht 69.0 in | Wt 256.0 lb

## 2021-03-10 DIAGNOSIS — I2510 Atherosclerotic heart disease of native coronary artery without angina pectoris: Secondary | ICD-10-CM | POA: Diagnosis not present

## 2021-03-10 NOTE — Patient Instructions (Addendum)
Medication Instructions:  NO CHANGES *If you need a refill on your cardiac medications before your next appointment, please call your pharmacy*   Lab Work: NONE If you have labs (blood work) drawn today and your tests are completely normal, you will receive your results only by: [place] (if you have MyChart) OR A paper copy in the mail If you have any lab test that is abnormal or we need to change your treatment, we will call you to review the results.   Testing/Procedures: NONE   Follow-Up: At [facility], you and your health needs are our priority.  As part of our continuing mission to provide you with exceptional heart care, we have created designated Provider Care Teams.  These Care Teams include your primary Cardiologist (physician) and Advanced Practice Providers (APPs -  Physician Assistants and Nurse Practitioners) who all work together to provide you with the care you need, when you need it.  We recommend signing up for the patient portal called "MyChart".  Sign up information is provided on this After Visit Summary.  MyChart is used to connect with patients for Virtual Visits (Telemedicine).  Patients are able to view lab/test results, encounter notes, upcoming appointments, etc.  Non-urgent messages can be sent to your provider as well.   To learn more about what you can do with MyChart, go to [email or web address].    Your next appointment:   12 month(s)  The format for your next appointment:   In Person  Provider:   DR [name] Cancel      Other Instructions

## 2021-03-13 ENCOUNTER — Other Ambulatory Visit: Payer: Self-pay

## 2021-03-13 DIAGNOSIS — M545 Low back pain, unspecified: Secondary | ICD-10-CM

## 2021-03-28 DIAGNOSIS — H25013 Cortical age-related cataract, bilateral: Secondary | ICD-10-CM | POA: Diagnosis not present

## 2021-03-28 DIAGNOSIS — H35373 Puckering of macula, bilateral: Secondary | ICD-10-CM | POA: Diagnosis not present

## 2021-03-28 DIAGNOSIS — H2512 Age-related nuclear cataract, left eye: Secondary | ICD-10-CM | POA: Diagnosis not present

## 2021-03-28 DIAGNOSIS — H25043 Posterior subcapsular polar age-related cataract, bilateral: Secondary | ICD-10-CM | POA: Diagnosis not present

## 2021-03-28 DIAGNOSIS — H2513 Age-related nuclear cataract, bilateral: Secondary | ICD-10-CM | POA: Diagnosis not present

## 2021-03-28 DIAGNOSIS — H18413 Arcus senilis, bilateral: Secondary | ICD-10-CM | POA: Diagnosis not present

## 2021-04-11 ENCOUNTER — Encounter: Payer: Self-pay | Admitting: Internal Medicine

## 2021-04-11 NOTE — Progress Notes (Signed)
Subjective:    Patient ID: Edwin [name], male    DOB: 1945/07/30, 76 y.o.   MRN: [id number]  This visit occurred during the SARS-CoV-2 public health emergency.  Safety protocols were in place, including screening questions prior to the visit, additional usage of staff PPE, and extensive cleaning of exam room while observing appropriate contact time as indicated for disinfecting solutions.     HPI The patient is here for follow up of their chronic medical problems, including CAD, htn, hypothyroidism, hld   He is exercising regularly.   He did lose weight, but has since regained some of that back.  He knows his portion sizes part of the issue and he will try to work on weight loss the beginning of next year.  He has no concerns.  Medications and allergies reviewed with patient and updated if appropriate.  Patient Active Problem List   Diagnosis Date Noted   Reactive airway disease with wheezing 08/22/2020   Cough 08/22/2020   Left shoulder pain 05/31/2020   Pes anserine bursitis 05/05/2020   Onychomycosis of right great toe 10/27/2019   Piriformis syndrome of left side 02/05/2019   CAD (coronary artery disease) 02/03/2019   Greater trochanteric bursitis 01/01/2019   Degenerative arthritis of left knee 05/08/2018   Prediabetes 03/01/2018   Muscle cramping 02/27/2018   Subclinical hypothyroidism 02/27/2018   Seasonal allergic rhinitis due to pollen 09/09/2017   [facility] (acromioclavicular) arthritis 08/14/2017   Tear of MCL (medial collateral ligament) of knee, right, initial encounter 06/05/2017   Piriformis syndrome of right side 02/19/2017   Lower back pain 02/15/2017   Acute bronchitis 10/09/2016   Patellofemoral arthritis of right knee 12/30/2014   Morbid obesity ([place])    Nonspecific abnormal electrocardiogram (ECG) (EKG) 08/18/2014   OSA (obstructive sleep apnea) 06/02/2014   Achilles tendinosis 12/26/2012   RBBB 05/20/2012   Hyperlipidemia 03/03/2008   Essential  hypertension 03/03/2008   History of colonic polyps 03/03/2008   History of basal cell cancer 05/26/2007   GILBERT'S SYNDROME 05/26/2007   DIVERTICULOSIS, COLON 05/26/2007   BPH (benign prostatic hyperplasia) 05/26/2007    Current Outpatient Medications on File Prior to Visit  Medication Sig Dispense Refill   aspirin 81 MG tablet Take 81 mg by mouth daily.     atorvastatin (LIPITOR) 10 MG tablet TAKE 1 TABLET EVERY OTHER DAY 45 tablet 0   azelastine (ASTELIN) 0.1 % nasal spray Place into the nose.     Cholecalciferol (VITAMIN D3) 1000 UNITS CAPS Take 1 capsule by mouth daily.     Cyanocobalamin (VITAMIN B-12 PO) Take by mouth as directed.     metoprolol succinate (TOPROL-XL) 50 MG 24 hr tablet TAKE 1 TABLET ONCE DAILY. TAKE WITH OR IMMEDIATELY FOLLOWING A MEAL. 90 tablet 3   Multiple Vitamin (MULTIVITAMIN) tablet Take 1 tablet by mouth daily.     naproxen sodium (ALEVE) 220 MG tablet Take 220 mg by mouth daily as needed.     nitroGLYCERIN (NITROSTAT) 0.4 MG SL tablet Place 1 tablet (0.4 mg total) under the tongue every 5 (five) minutes as needed for chest pain. 25 tablet 3   Turmeric (QC TUMERIC COMPLEX PO) Take by mouth daily.     No current facility-administered medications on file prior to visit.    Past Medical History:  Diagnosis Date   BPH (benign prostatic hypertrophy)    Cancer (HCC)    Basal cell of face, [name]   Diverticulosis    Rosanna Randy  syndrome    Hyperglycemia    Hyperlipidemia    NMR Lipoprofile 2005; LDL 146 (2007/1330), HDL 38, TG 169.  LDL goal = <100; Framingham Study LDL goal =<130   Hypertension    OSA on CPAP    Tubular adenoma of colon 01/19/08    Past Surgical History:  Procedure Laterality Date   COLONOSCOPY  2014   neg; [facility]   COLONOSCOPY W/ POLYPECTOMY  2004 & 2009   Tics; [facility]   KNEE ARTHROSCOPY     [name], left knee   TONSILLECTOMY     WISDOM TOOTH EXTRACTION      Social History   Socioeconomic History    Marital status: Married    Spouse name: Not on file   Number of children: 2   Years of education: 18   Highest education level: Not on file  Occupational History   Occupation: Pharmacist, hospital // Retired    Fish farm manager: [employer]  Tobacco Use   Smoking status: Never   Smokeless tobacco: Never  Vaping Use   Vaping Use: Never used  Substance and Sexual Activity   Alcohol use: Yes    Alcohol/week: 0.0 standard drinks    Comment: socially, 3-5 beers/week   Drug use: No   Sexual activity: Yes  Other Topics Concern   Not on file  Social History Narrative   Fun: Travel, read, movies, concerts    Denies abuse and feels safe at home.    Social Determinants of Health   Financial Resource Strain: Not on file  Food Insecurity: Not on file  Transportation Needs: Not on file  Physical Activity: Not on file  Stress: Not on file  Social Connections: Not on file    Family History  Problem Relation Age of Onset   Diverticulosis Mother    Stroke Mother        late 40s   Parkinsonism Mother    Diabetes Father    Hypertension Father    Heart disease Father 36       CABG   Cancer Brother        lymphoma   Appendicitis Brother        ruptured   Asthma Brother        childhood   Stroke Paternal Grandmother 76   Heart attack Maternal Grandmother 80       questionable   Heart attack Maternal Grandfather 66   Parkinsonism Maternal Grandfather    Hypertension Sister    Appendicitis Sister        ruptured   Colon cancer Neg Hx    Esophageal cancer Neg Hx    Rectal cancer Neg Hx    Stomach cancer Neg Hx     Review of Systems  Constitutional:  Negative for chills and fever.  Respiratory:  Negative for cough, shortness of breath and wheezing.   Cardiovascular:  Positive for leg swelling (mild at times). Negative for chest pain and palpitations.  Neurological:  Negative for dizziness, light-headedness and headaches.      Objective:   Vitals:   04/12/21 1010   BP: 140/74  Pulse: 75  Temp: 98.2 F (36.8 C)  SpO2: 95%   BP Readings from Last 3 Encounters:  04/12/21 140/74  03/10/21 (!) 144/86  02/14/21 140/76   Wt Readings from Last 3 Encounters:  04/12/21 257 lb 6.4 oz (116.8 kg)  03/10/21 256 lb (116.1 kg)  02/14/21 253 lb (114.8 kg)   Body mass index  is 38.01 kg/m.   Physical Exam    Constitutional: Appears well-developed and well-nourished. No distress.  HENT:  Head: Normocephalic and atraumatic.  Neck: Neck supple. No tracheal deviation present. No thyromegaly present.  No cervical lymphadenopathy Cardiovascular: Normal rate, regular rhythm and normal heart sounds.   No murmur heard. No carotid bruit .  No edema Pulmonary/Chest: Effort normal and breath sounds normal. No respiratory distress. No has no wheezes. No rales.  Skin: Skin is warm and dry. Not diaphoretic.  Psychiatric: Normal mood and affect. Behavior is normal.      Assessment & Plan:    See Problem List for Assessment and Plan of chronic medical problems.

## 2021-04-11 NOTE — Patient Instructions (Addendum)
    Blood work was ordered.      Medications changes include :   none     Please followup in 6 months  

## 2021-04-12 ENCOUNTER — Other Ambulatory Visit: Payer: Self-pay

## 2021-04-12 ENCOUNTER — Ambulatory Visit (INDEPENDENT_AMBULATORY_CARE_PROVIDER_SITE_OTHER): Payer: Medicare HMO | Admitting: Internal Medicine

## 2021-04-12 VITALS — BP 140/74 | HR 75 | Temp 98.2°F | Ht 69.0 in | Wt 257.4 lb

## 2021-04-12 DIAGNOSIS — E782 Mixed hyperlipidemia: Secondary | ICD-10-CM | POA: Diagnosis not present

## 2021-04-12 DIAGNOSIS — E038 Other specified hypothyroidism: Secondary | ICD-10-CM | POA: Diagnosis not present

## 2021-04-12 DIAGNOSIS — R7303 Prediabetes: Secondary | ICD-10-CM | POA: Diagnosis not present

## 2021-04-12 DIAGNOSIS — I2510 Atherosclerotic heart disease of native coronary artery without angina pectoris: Secondary | ICD-10-CM | POA: Diagnosis not present

## 2021-04-12 DIAGNOSIS — I10 Essential (primary) hypertension: Secondary | ICD-10-CM

## 2021-04-12 NOTE — Assessment & Plan Note (Signed)
Chronic Check a1c Low sugar / carb diet Stressed regular exercise  

## 2021-04-12 NOTE — Assessment & Plan Note (Signed)
Chronic No symptoms of angina Continue aspirin 81 mg daily, atorvastatin 10 mg daily Blood pressure well controlled Sugars controlled

## 2021-04-12 NOTE — Assessment & Plan Note (Signed)
Chronic History of subclinical hypothyroidism Clinically euthyroid Check TSH

## 2021-04-12 NOTE — Assessment & Plan Note (Signed)
Chronic Blood pressure well controlled CMP Continue metoprolol XL 50 mg daily 

## 2021-04-12 NOTE — Assessment & Plan Note (Signed)
Chronic Regular exercise and healthy diet encouraged Check lipid panel  Continue atorvastatin 10 mg daily 

## 2021-04-12 NOTE — Assessment & Plan Note (Addendum)
Chronic BMI 38 with hypertension, hyperlipidemia, prediabetes and CAD Continue regular exercise Stressed healthy diet, decrease portions

## 2021-04-13 ENCOUNTER — Other Ambulatory Visit (INDEPENDENT_AMBULATORY_CARE_PROVIDER_SITE_OTHER): Payer: Medicare HMO

## 2021-04-13 DIAGNOSIS — E782 Mixed hyperlipidemia: Secondary | ICD-10-CM

## 2021-04-13 DIAGNOSIS — I10 Essential (primary) hypertension: Secondary | ICD-10-CM | POA: Diagnosis not present

## 2021-04-13 DIAGNOSIS — D2262 Melanocytic nevi of left upper limb, including shoulder: Secondary | ICD-10-CM | POA: Diagnosis not present

## 2021-04-13 DIAGNOSIS — L218 Other seborrheic dermatitis: Secondary | ICD-10-CM | POA: Diagnosis not present

## 2021-04-13 DIAGNOSIS — D225 Melanocytic nevi of trunk: Secondary | ICD-10-CM | POA: Diagnosis not present

## 2021-04-13 DIAGNOSIS — L850 Acquired ichthyosis: Secondary | ICD-10-CM | POA: Diagnosis not present

## 2021-04-13 DIAGNOSIS — L821 Other seborrheic keratosis: Secondary | ICD-10-CM | POA: Diagnosis not present

## 2021-04-13 DIAGNOSIS — R7303 Prediabetes: Secondary | ICD-10-CM | POA: Diagnosis not present

## 2021-04-13 DIAGNOSIS — D3612 Benign neoplasm of peripheral nerves and autonomic nervous system, upper limb, including shoulder: Secondary | ICD-10-CM | POA: Diagnosis not present

## 2021-04-13 DIAGNOSIS — E038 Other specified hypothyroidism: Secondary | ICD-10-CM

## 2021-04-13 DIAGNOSIS — Z85828 Personal history of other malignant neoplasm of skin: Secondary | ICD-10-CM | POA: Diagnosis not present

## 2021-04-13 DIAGNOSIS — L570 Actinic keratosis: Secondary | ICD-10-CM | POA: Diagnosis not present

## 2021-04-13 LAB — COMPREHENSIVE METABOLIC PANEL
ALT: 23 U/L (ref 0–53)
AST: 19 U/L (ref 0–37)
Albumin: 4 g/dL (ref 3.5–5.2)
Alkaline Phosphatase: 31 U/L — ABNORMAL LOW (ref 39–117)
BUN: 22 mg/dL (ref 6–23)
CO2: 25 mEq/L (ref 19–32)
Calcium: 9.2 mg/dL (ref 8.4–10.5)
Chloride: 105 mEq/L (ref 96–112)
Creatinine, Ser: 1.04 mg/dL (ref 0.40–1.50)
GFR: 71.33 mL/min (ref >60.00)
Glucose, Bld: 136 mg/dL — ABNORMAL HIGH (ref 70–99)
Potassium: 3.9 mEq/L (ref 3.5–5.1)
Sodium: 138 mEq/L (ref 135–145)
Total Bilirubin: 0.8 mg/dL (ref 0.2–1.2)
Total Protein: 6.6 g/dL (ref 6.0–8.3)

## 2021-04-13 LAB — LIPID PANEL
Cholesterol: 130 mg/dL (ref 0–200)
HDL: 41.3 mg/dL (ref >39.00)
LDL Cholesterol: 56 mg/dL (ref 0–99)
NonHDL: 89.09
Total CHOL/HDL Ratio: 3
Triglycerides: 165 mg/dL — ABNORMAL HIGH (ref 0.0–149.0)
VLDL: 33 mg/dL (ref 0.0–40.0)

## 2021-04-13 LAB — HEMOGLOBIN A1C: Hgb A1c MFr Bld: 6.5 % (ref 4.6–6.5)

## 2021-04-13 LAB — TSH: TSH: 3.49 u[IU]/mL (ref 0.35–5.50)

## 2021-04-14 ENCOUNTER — Ambulatory Visit: Payer: Medicare HMO | Attending: Family Medicine

## 2021-04-14 ENCOUNTER — Other Ambulatory Visit: Payer: Self-pay

## 2021-04-14 DIAGNOSIS — G8929 Other chronic pain: Secondary | ICD-10-CM | POA: Diagnosis not present

## 2021-04-14 DIAGNOSIS — M6281 Muscle weakness (generalized): Secondary | ICD-10-CM | POA: Insufficient documentation

## 2021-04-14 DIAGNOSIS — M7542 Impingement syndrome of left shoulder: Secondary | ICD-10-CM | POA: Diagnosis not present

## 2021-04-14 DIAGNOSIS — M25512 Pain in left shoulder: Secondary | ICD-10-CM | POA: Diagnosis not present

## 2021-04-14 DIAGNOSIS — M545 Low back pain, unspecified: Secondary | ICD-10-CM | POA: Diagnosis not present

## 2021-04-14 NOTE — Therapy (Addendum)
OUTPATIENT PHYSICAL THERAPY THORACOLUMBAR EVALUATION   Patient Name: Edwin [name] MRN: [id number] DOB:01-11-1946, 75 y.o., male Today's Date: 04/14/2021    Past Medical History:  Diagnosis Date   BPH (benign prostatic hypertrophy)    Cancer ([place])    Basal cell of face, [name]   Diverticulosis    Rosanna Randy syndrome    Hyperglycemia    Hyperlipidemia    NMR Lipoprofile 2005; LDL 146 (2007/1330), HDL 38, TG 169.  LDL goal = <100; Framingham Study LDL goal =<130   Hypertension    OSA on CPAP    Tubular adenoma of colon 01/19/08   Past Surgical History:  Procedure Laterality Date   COLONOSCOPY  2014   neg; [facility]   COLONOSCOPY W/ POLYPECTOMY  2004 & 2009   Tics; [facility]   KNEE ARTHROSCOPY     [name], left knee   TONSILLECTOMY     WISDOM TOOTH EXTRACTION     Patient Active Problem List   Diagnosis Date Noted   Reactive airway disease with wheezing 08/22/2020   Cough 08/22/2020   Left shoulder pain 05/31/2020   Pes anserine bursitis 05/05/2020   Onychomycosis of right great toe 10/27/2019   Piriformis syndrome of left side 02/05/2019   CAD (coronary artery disease) 02/03/2019   Greater trochanteric bursitis 01/01/2019   Degenerative arthritis of left knee 05/08/2018   Prediabetes 03/01/2018   Muscle cramping 02/27/2018   Subclinical hypothyroidism 02/27/2018   Seasonal allergic rhinitis due to pollen 09/09/2017   [facility] (acromioclavicular) arthritis 08/14/2017   Tear of MCL (medial collateral ligament) of knee, right, initial encounter 06/05/2017   Piriformis syndrome of right side 02/19/2017   Lower back pain 02/15/2017   Acute bronchitis 10/09/2016   Patellofemoral arthritis of right knee 12/30/2014   Morbid obesity ([place])    Nonspecific abnormal electrocardiogram (ECG) (EKG) 08/18/2014   OSA (obstructive sleep apnea) 06/02/2014   Achilles tendinosis 12/26/2012   RBBB 05/20/2012   Hyperlipidemia 03/03/2008   Essential hypertension  03/03/2008   History of colonic polyps 03/03/2008   History of basal cell cancer 05/26/2007   GILBERT'S SYNDROME 05/26/2007   DIVERTICULOSIS, COLON 05/26/2007   BPH (benign prostatic hyperplasia) 05/26/2007    PCP: [name], MD  REFERRING PROVIDER: Lyndal Pulley, DO  REFERRING DIAG: M54.50 (ICD-10-CM) - Low back pain, unspecified back pain laterality, unspecified chronicity, unspecified whether sciatica present   THERAPY DIAG:  Low back pain, unspecified back pain laterality, unspecified chronicity, unspecified whether sciatica present   ONSET DATE: 1 year ago  SUBJECTIVE:  SUBJECTIVE STATEMENT: Describes a history of 1 year of low back pain central in nature, resolved with position changes, worsened with prolonged sitting. PERTINENT HISTORY:  Past Medical History:  Diagnosis Date   BPH (benign prostatic hypertrophy)     Cancer (HCC)      Basal cell of face, [name]   Diverticulosis     Rosanna Randy syndrome     Hyperglycemia     Hyperlipidemia      NMR Lipoprofile 2005; LDL 146 (2007/1330), HDL 38, TG 169.  LDL goal = <100; Framingham Study LDL goal =<130   Hypertension     OSA on CPAP     Tubular adenoma of colon 01/19/08         Past Surgical History:  Procedure Laterality Date   COLONOSCOPY   2014    neg; [facility]   COLONOSCOPY W/ POLYPECTOMY   2004 & 2009    Tics; [facility]   KNEE ARTHROSCOPY        [name], left knee   TONSILLECTOMY       WISDOM TOOTH EXTRACTION         PAIN:  Are you having pain? Yes VAS scale: 4/10 Pain location: central low back Pain orientation: Medial  PAIN TYPE: aching Pain description: intermittent  Aggravating factors: sitting  Relieving factors: position change  PRECAUTIONS: None  WEIGHT BEARING RESTRICTIONS  No  FALLS:  Has patient fallen in last 6 months? No, Number of falls: 0    OCCUPATION: retired Customer service manager  PLOF: Independent, belongs to fitness center  PATIENT GOALS To reduce my pain and develop a self management strategy    OBJECTIVE:   DIAGNOSTIC FINDINGS:  IMPRESSION: 1. Multilevel degenerative changes of the lumbar spine as described above. Severe spinal canal stenosis at L3-L4. 2. Moderate spinal canal and severe left lateral recess stenosis at L4-L5. 3. Moderate spinal canal and left lateral recess stenosis at L2-L3.    SCREENING FOR RED FLAGS: Bowel or bladder incontinence: No Spinal tumors: No Cauda equina syndrome: No Compression fracture: No Abdominal aneurysm: No  COGNITION:  Overall cognitive status: Within functional limits for tasks assessed     SENSATION:  Light touch: Appears intact   MUSCLE LENGTH: Hamstrings: neg slump test, SLR test Thomas test: unremarkable  POSTURE:  Decreased lordosis  LUMBARAROM/PROM  A/PROM A/PROM  04/14/2021  Flexion 50%  Extension 25%  Right lateral flexion 50%  Left lateral flexion 50%  Right rotation   Left rotation    (Blank rows = not tested)  LE AROM/PROM:  A/PROM Right 04/14/2021 Left 04/14/2021  Hip flexion wfl wfl  Hip extension 10d 10d  Hip abduction wfl wfl  Hip adduction    Hip internal rotation    Hip external rotation    Knee flexion wfl wfl  Knee extension wfl wfl  Ankle dorsiflexion wfl wfl  Ankle plantarflexion wfl wfl  Ankle inversion    Ankle eversion     (Blank rows = not tested)  LE MMT:  MMT Right 04/14/2021 Left 04/14/2021  Hip flexion 4 4  Hip extension 4 4  Hip abduction 4 4  Hip adduction    Hip internal rotation    Hip external rotation    Knee flexion 4 4  Knee extension 4 4  Ankle dorsiflexion 4 4  Ankle plantarflexion 4 4  Ankle inversion    Ankle eversion     (Blank rows = not tested)  LUMBAR SPECIAL TESTS:  Straight leg raise test: Negative,  Slump test:  Negative, and Thomas test: Negative  SPINAL SEGMENTAL MOBILITY ASSESSMENT:  Limited spring testing through lumbar spine    GAIT: Distance walked: 100 ft Assistive device utilized: None Level of assistance: Complete Independence     TODAY'S TREATMENT  HEP, evaluation   PATIENT EDUCATION:  Education details: Discussed eval findings, rehab potential and POC Person educated: Patient Education method: Explanation Education comprehension: verbalized understanding   HOME EXERCISE PROGRAM: Access Code: [email or web address] URL: [email or web address] Date: 04/14/2021 Prepared by: Sharlynn Oliphant  Exercises Supine Piriformis Stretch Pulling Heel to Hip - 2 x daily - 7 x weekly - 1 sets - 3 reps - 30s hold Supine Lower Trunk Rotation - 2 x daily - 7 x weekly - 1 sets - 3 reps - 30s hold Supine March - 2 x daily - 7 x weekly - 2 sets - 10 reps Curl Up with Arms Crossed - 2 x daily - 7 x weekly - 2 sets - 10 reps   ASSESSMENT:  CLINICAL IMPRESSION: Patient is a 75 y.o. male who was seen today for physical therapy evaluation and treatment for diffuse low back pain.  Findings indicate limited segmental mobility throughout lumbar spine w/o signs of neurological irritation or deficits, myofascial restrictions also present throughout lumbosacral region.  Observation of positional changes demonstrate weakness in core structures.  Objective impairments include Abnormal gait, decreased activity tolerance, decreased mobility, decreased ROM, decreased strength, hypomobility, impaired flexibility, postural dysfunction, and pain. These impairments are limiting patient from community activity and driving. Personal factors including Age and Fitness are also affecting patient's functional outcome. Patient will benefit from skilled PT to address above impairments and improve overall function.  REHAB POTENTIAL: Good  CLINICAL DECISION MAKING: Stable/uncomplicated  EVALUATION COMPLEXITY:  Low   GOALS: Goals reviewed with patient? Yes  SHORT TERM GOALS:  STG Name Target Date Goal status  1 Patient to demo independence in HEP Baseline: [email or web address] 04/28/2021 INITIAL  2 Patient to report 3/10 diffuse  low back discomfort with prolonged sitting/driving Baseline: 8/75 pain with prolonged sitting/driving 64/33/2951 INITIAL  LONG TERM GOALS:   LTG Name Target Date Goal status  1 Patient to demo 75% trunk mobility in flexion, extension and B sidebending, AROM Baseline: AROM 50% flexion and B sidebending, 25% extension 05/19/2021 INITIAL  2 Patient to increase B LE strength to 4+/5 in hip flexion, extension and abduction, knee flexion and extension and ankle PF/DF Baseline: 4/5 strength throughout 05/19/2021 INITIAL  3 Patient to report 2/10 pain levels following 1 hour driving/sitting Baseline: 4/10 pain following driving sitting 8/84/1660 INITIAL  4 Improve overall core strength to 4/5 as evidenced by less effort noted with positional changes especially supine/sit transitions Baseline: Moderate effort 05/19/2021 INITIAL  5 Patient to demo moderate restrictions to lumbar spring testing at L5-2 Baseline: severe restrictions to spring testing L5-2 05/19/2021 INITIAL             PLAN: PT FREQUENCY: 2x/week  PT DURATION: 4 weeks  PLANNED INTERVENTIONS: Therapeutic exercises, Therapeutic activity, Neuro Muscular re-education, Balance training, Gait training, Patient/Family education, and Joint mobilization  PLAN FOR NEXT SESSION: HEP f/u, manual mobilization to lumbar segments, initiate LE and core strengthening through PREs and functional patterns   Lanice Shirts PT 04/14/2021, 11:16 AM

## 2021-04-19 ENCOUNTER — Ambulatory Visit: Payer: Medicare HMO

## 2021-04-19 ENCOUNTER — Other Ambulatory Visit: Payer: Self-pay

## 2021-04-19 DIAGNOSIS — G8929 Other chronic pain: Secondary | ICD-10-CM | POA: Diagnosis not present

## 2021-04-19 DIAGNOSIS — M25512 Pain in left shoulder: Secondary | ICD-10-CM

## 2021-04-19 DIAGNOSIS — M545 Low back pain, unspecified: Secondary | ICD-10-CM | POA: Diagnosis not present

## 2021-04-19 DIAGNOSIS — M7542 Impingement syndrome of left shoulder: Secondary | ICD-10-CM | POA: Diagnosis not present

## 2021-04-19 DIAGNOSIS — M6281 Muscle weakness (generalized): Secondary | ICD-10-CM

## 2021-04-19 NOTE — Therapy (Signed)
OUTPATIENT PHYSICAL THERAPY TREATMENT NOTE   Patient Name: Edwin [name] MRN: [id number] DOB:[birth year], 75 y.o., male Today's Date: 04/19/2021  PCP: [name], MD REFERRING PROVIDER: [name], MD   PT End of Session - 04/19/21 1556     Visit Number 2    Number of Visits 9    Date for PT Re-Evaluation 05/19/21    Authorization Type [employer] MCR    Authorization Time Period 04/14/21-05/19/21    Progress Note Due on Visit 9    PT Start Time 9485    PT Stop Time 1540    PT Time Calculation (min) 55 min    Activity Tolerance Patient tolerated treatment well    Behavior During Therapy [facility] for tasks assessed/performed             Past Medical History:  Diagnosis Date   BPH (benign prostatic hypertrophy)    Cancer ([place])    Basal cell of face, [name]   Diverticulosis    Rosanna Randy syndrome    Hyperglycemia    Hyperlipidemia    NMR Lipoprofile 2005; LDL 146 (2007/1330), HDL 38, TG 169.  LDL goal = <100; Framingham Study LDL goal =<130   Hypertension    OSA on CPAP    Tubular adenoma of colon 01/19/08   Past Surgical History:  Procedure Laterality Date   COLONOSCOPY  2014   neg; [facility]   COLONOSCOPY W/ POLYPECTOMY  2004 & 2009   Tics; [facility]   KNEE ARTHROSCOPY     [name], left knee   TONSILLECTOMY     WISDOM TOOTH EXTRACTION     Patient Active Problem List   Diagnosis Date Noted   Reactive airway disease with wheezing 08/22/2020   Cough 08/22/2020   Left shoulder pain 05/31/2020   Pes anserine bursitis 05/05/2020   Onychomycosis of right great toe 10/27/2019   Piriformis syndrome of left side 02/05/2019   CAD (coronary artery disease) 02/03/2019   Greater trochanteric bursitis 01/01/2019   Degenerative arthritis of left knee 05/08/2018   Prediabetes 03/01/2018   Muscle cramping 02/27/2018   Subclinical hypothyroidism 02/27/2018   Seasonal allergic rhinitis due to pollen 09/09/2017   [facility] (acromioclavicular) arthritis  08/14/2017   Tear of MCL (medial collateral ligament) of knee, right, initial encounter 06/05/2017   Piriformis syndrome of right side 02/19/2017   Lower back pain 02/15/2017   Acute bronchitis 10/09/2016   Patellofemoral arthritis of right knee 12/30/2014   Morbid obesity ([place])    Nonspecific abnormal electrocardiogram (ECG) (EKG) 08/18/2014   OSA (obstructive sleep apnea) 06/02/2014   Achilles tendinosis 12/26/2012   RBBB 05/20/2012   Hyperlipidemia 03/03/2008   Essential hypertension 03/03/2008   History of colonic polyps 03/03/2008   History of basal cell cancer 05/26/2007   GILBERT'S SYNDROME 05/26/2007   DIVERTICULOSIS, COLON 05/26/2007   BPH (benign prostatic hyperplasia) 05/26/2007    REFERRING DIAG: L shoulder pain, Chronic midline low back pain without sciatica  Muscle weakness (generalized)  Impingement syndrome of left shoulder  Chronic left shoulder pain  THERAPY DIAG:  Chronic midline low back pain without sciatica  Muscle weakness (generalized)  Impingement syndrome of left shoulder  Chronic left shoulder pain  PERTINENT HISTORY:   Left shoulder pain - Lyndal Pulley, DO at 02/14/2021 10:20 AM  Status: Written  Related Problem: Left shoulder pain    Patient is having increasing limited range of motion.  Patient states that the pain is starting to affect daily activities.  Patient does have a mild positive empty can which is new as well.  Discussed with patient again at great length.  I discussed icing regimen and home exercises.  Discussed which activities to do which wants to avoid.  We did discuss that with patient failing all other conservative therapy including formal physical therapy I do feel that MRI, MR arthrogram would be necessary at this time.  Follow-up with me after imaging to discuss further.     Lower back pain - Lyndal Pulley, DO at 02/14/2021 10:22 AM  Status: Written  Related Problem: Lower back pain    Lower back pain that he  has had for years.  Patient's x-rays have shown some mild progression noted especially with the advanced L3-L4 and L4-L5 arthropathy and degenerative disc disease patient has had this pain for 4 years but slowly progressing and not responding to conservative therapy.  Do feel MRI could be beneficial to further evaluate depending on findings we can discuss the possibility of epidurals but we do think that ankle pain avoid any surgical intervention.  If patient has any worsening pain, bowel or bladder incontinence, or with depending on findings we will discuss further management       PRECAUTIONS: None  SUBJECTIVE: No changes to note in low back symptoms, L shoulder pain intermittent worse with reaching behind back  PAIN:  Are you having pain? Yes VAS scale: 4/10 L shoulder, 1/10 low back Pain location: L shoulder, low back Pain orientation: Left  PAIN TYPE: dull Pain description: intermittent  Aggravating factors: reaching behind Relieving factors: rest    OBJECTIVE:   TODAY'S TREATMENT: TODAY'S TREATMENT  PA mobs to L5-2, 30s static hold followed by 10 grad 3 mobs to L5-2, LTR 30s hold, 2x per side, quadriped cat/camel 10x, quadriped LE extension 10x per side, seated core exercises of hip and shoulder tosses, chops and Vs, 10 reps each using 2 KG ball, bridging 10x  L SHOULDER ASSESSMENT; L rotator strength 4+/5 throughout, positive Neers and impingement signs, PROM 145d L flexion, 120d abduction, 70d IR, 50d ER(45d abd) and 40d @90d  abduction     PATIENT EDUCATION:  Education details: Discussed eval findings, rehab potential and POC Person educated: Patient Education method: Explanation Education comprehension: verbalized understanding     HOME EXERCISE PROGRAM: Access Code: [email or web address] URL: [email or web address] Date: 04/19/2021 Prepared by: Sharlynn Oliphant  Exercises Supine Piriformis Stretch Pulling Heel to Hip - 2 x daily - 7 x weekly - 1 sets - 3 reps - 30s  hold Supine Lower Trunk Rotation - 2 x daily - 7 x weekly - 1 sets - 3 reps - 30s hold Supine March - 2 x daily - 7 x weekly - 2 sets - 10 reps Curl Up with Arms Crossed - 2 x daily - 7 x weekly - 2 sets - 10 reps Quadruped Alternating Leg Extensions - 2 x daily - 7 x weekly - 3 sets - 10 reps - 30s hold Supine Shoulder Flexion with Dowel - 2 x daily - 7 x weekly - 1 sets - 30 reps Supine Shoulder External Internal Rotation AAROM with Dowel - 2 x daily - 7 x weekly - 1 sets - 30 reps Standing Shoulder Scaption - 2 x daily - 7 x weekly - 1 sets - 30 reps    ASSESSMENT:   CLINICAL IMPRESSION: Patient seen for assessment of L shoulder pain with findings of decreased ROM in a capsular pattern with positive impingement signs but functional rotator  cuff strength.  HEP issued for PROM as well as active scaption to re-enforce motor patterning.  Lumbar spring testing showing slightly less restrictions.  Added core strengthening to facilitate stability in abdominal wall and encourage intersegmental spinal mobility.  Core weakness remains evident through observation of supine/sit transitions and bed mobility   REHAB POTENTIAL: Good   CLINICAL DECISION MAKING: Stable/uncomplicated   EVALUATION COMPLEXITY: Low     GOALS: Goals reviewed with patient? Yes   SHORT TERM GOALS:   STG Name Target Date Goal status  1 Patient to demo independence in HEP Baseline: [email or web address] 04/28/2021 INITIAL  2 Patient to report 3/10 diffuse  low back discomfort with prolonged sitting/driving Baseline: 5/00 pain with prolonged sitting/driving 93/81/8299 INITIAL  3 Patient to reach b/h back with 1/10 pain Baseline: 4/0 intermittent pain when reaching b/h back 04/28/2021 INITIAL  LONG TERM GOALS:    LTG Name Target Date Goal status  1 Patient to demo 75% trunk mobility in flexion, extension and B sidebending, AROM Baseline: AROM 50% flexion and B sidebending, 25% extension 05/19/2021 INITIAL  2 Patient to increase B LE  strength to 4+/5 in hip flexion, extension and abduction, knee flexion and extension and ankle PF/DF Baseline: 4/5 strength throughout 05/19/2021 INITIAL  3 Patient to report 2/10 pain levels following 1 hour driving/sitting Baseline: 4/10 pain following driving sitting 3/71/6967 INITIAL  4 Improve overall core strength to 4/5 as evidenced by less effort noted with positional changes especially supine/sit transitions Baseline: Moderate effort 05/19/2021 INITIAL  5 Patient to demo moderate restrictions to lumbar spring testing at L5-2 Baseline: severe restrictions to spring testing L5-2 05/19/2021 INITIAL       6  Patient to regain 160d AROM L shoulder flexion, abduction, 70d IR/ER Baseline: 145d L flexion, 120d abduction, 70d IR, 50d ER and 40d @90d  abduction 05/19/2021  INITIAL    7  Patient to demo negative impingement signs        PLAN: PT FREQUENCY: 2x/week   PT DURATION: 4 weeks   PLANNED INTERVENTIONS: Therapeutic exercises, Therapeutic activity, Neuro Muscular re-education, Balance training, Gait training, Patient/Family education, and Joint mobilization   PLAN FOR NEXT SESSION: HEP f/u, manual mobilization to lumbar segments, initiate LE and core strengthening through PREs and functional patterns     Lanice Shirts PT 04/19/2021, 4:01 PM

## 2021-04-25 ENCOUNTER — Other Ambulatory Visit: Payer: Self-pay

## 2021-04-25 ENCOUNTER — Ambulatory Visit: Payer: Medicare HMO

## 2021-04-25 DIAGNOSIS — G8929 Other chronic pain: Secondary | ICD-10-CM

## 2021-04-25 DIAGNOSIS — M6281 Muscle weakness (generalized): Secondary | ICD-10-CM

## 2021-04-25 DIAGNOSIS — M545 Low back pain, unspecified: Secondary | ICD-10-CM | POA: Diagnosis not present

## 2021-04-25 DIAGNOSIS — M7542 Impingement syndrome of left shoulder: Secondary | ICD-10-CM | POA: Diagnosis not present

## 2021-04-25 DIAGNOSIS — M25512 Pain in left shoulder: Secondary | ICD-10-CM | POA: Diagnosis not present

## 2021-04-25 NOTE — Therapy (Signed)
OUTPATIENT PHYSICAL THERAPY TREATMENT NOTE   Patient Name: Edwin [name] MRN: [id number] DOB:1945-08-07, 75 y.o., male Today's Date: 04/25/2021  PCP: [name], MD REFERRING PROVIDER: [name], MD   PT End of Session - 04/25/21 1356     Visit Number 3    Number of Visits 9    Date for PT Re-Evaluation 05/19/21    Authorization Type [employer] MCR    Authorization Time Period 04/14/21-05/19/21    Progress Note Due on Visit 9    PT Start Time 1400    PT Stop Time 1445    PT Time Calculation (min) 45 min    Activity Tolerance Patient tolerated treatment well    Behavior During Therapy [facility] for tasks assessed/performed             Past Medical History:  Diagnosis Date   BPH (benign prostatic hypertrophy)    Cancer ([place])    Basal cell of face, [name]   Diverticulosis    Rosanna Randy syndrome    Hyperglycemia    Hyperlipidemia    NMR Lipoprofile 2005; LDL 146 (2007/1330), HDL 38, TG 169.  LDL goal = <100; Framingham Study LDL goal =<130   Hypertension    OSA on CPAP    Tubular adenoma of colon 01/19/08   Past Surgical History:  Procedure Laterality Date   COLONOSCOPY  2014   neg;  [facility]   COLONOSCOPY W/ POLYPECTOMY  2004 & 2009   Tics; [facility]   KNEE ARTHROSCOPY     [name], left knee   TONSILLECTOMY     WISDOM TOOTH EXTRACTION     Patient Active Problem List   Diagnosis Date Noted   Reactive airway disease with wheezing 08/22/2020   Cough 08/22/2020   Left shoulder pain 05/31/2020   Pes anserine bursitis 05/05/2020   Onychomycosis of right great toe 10/27/2019   Piriformis syndrome of left side 02/05/2019   CAD (coronary artery disease) 02/03/2019   Greater trochanteric bursitis 01/01/2019   Degenerative arthritis of left knee 05/08/2018   Prediabetes 03/01/2018   Muscle cramping 02/27/2018   Subclinical hypothyroidism 02/27/2018   Seasonal allergic rhinitis due to pollen 09/09/2017   [facility] (acromioclavicular) arthritis  08/14/2017   Tear of MCL (medial collateral ligament) of knee, right, initial encounter 06/05/2017   Piriformis syndrome of right side 02/19/2017   Lower back pain 02/15/2017   Acute bronchitis 10/09/2016   Patellofemoral arthritis of right knee 12/30/2014   Morbid obesity ([place])    Nonspecific abnormal electrocardiogram (ECG) (EKG) 08/18/2014   OSA (obstructive sleep apnea) 06/02/2014   Achilles tendinosis 12/26/2012   RBBB 05/20/2012   Hyperlipidemia 03/03/2008   Essential hypertension 03/03/2008   History of colonic polyps 03/03/2008   History of basal cell cancer 05/26/2007   GILBERT'S SYNDROME 05/26/2007   DIVERTICULOSIS, COLON 05/26/2007   BPH (benign prostatic hyperplasia) 05/26/2007    REFERRING DIAG: L shoulder pain, Chronic midline low back pain without sciatica  Muscle weakness (generalized)  Impingement syndrome of left shoulder  Chronic left shoulder pain  THERAPY DIAG:  Chronic midline low back pain without sciatica  Muscle weakness (generalized)  Impingement syndrome of left shoulder  Chronic left shoulder pain  PERTINENT HISTORY:   Left shoulder pain - Lyndal Pulley, DO at 02/14/2021 10:20 AM  Status: Written  Related Problem: Left shoulder pain    Patient is having increasing limited range of motion.  Patient states that the pain is starting to affect daily activities.  Patient does have a mild positive empty can which is new as well.  Discussed with patient again at great length.  I discussed icing regimen and home exercises.  Discussed which activities to do which wants to avoid.  We did discuss that with patient failing all other conservative therapy including formal physical therapy I do feel that MRI, MR arthrogram would be necessary at this time.  Follow-up with me after imaging to discuss further.     Lower back pain - Lyndal Pulley, DO at 02/14/2021 10:22 AM  Status: Written  Related Problem: Lower back pain    Lower back pain that he  has had for years.  Patient's x-rays have shown some mild progression noted especially with the advanced L3-L4 and L4-L5 arthropathy and degenerative disc disease patient has had this pain for 4 years but slowly progressing and not responding to conservative therapy.  Do feel MRI could be beneficial to further evaluate depending on findings we can discuss the possibility of epidurals but we do think that ankle pain avoid any surgical intervention.  If patient has any worsening pain, bowel or bladder incontinence, or with depending on findings we will discuss further management       PRECAUTIONS: None  SUBJECTIVE: Reports a post exercise soreness to low back, continued restrictions in IR causing pain  PAIN:  Are you having pain? Yes VAS scale: 4/10 L shoulder, 1/10 low back Pain location: L shoulder, low back Pain orientation: Left  PAIN TYPE: dull Pain description: intermittent  Aggravating factors: reaching behind Relieving factors: rest    OBJECTIVE: AROM L shoulder in supine 155d flexion, 160 abd in scapular plane, 65d ER and 55d IR  TODAY'S TREATMENT: TODAY'S TREATMENT  Review of cane exercises 10x flexion, abd, ER Supine L shoulder press and protraction, flexion, sidelie ER, abd abduction holding unweighted ball all to 10 reps Seated core of hip and shoulder tosses, chops and Vs 10x each with yellow plioball Seated core of latissimus presses with alt. LAQs and marches with 2# wts at ankles Prone PA mobs to L5-2, 10x each segment grade 3 Quad alt UE/LE extension, 10x each, alternating pattern. L shoulder mobs in posterior and inferior directions     PATIENT EDUCATION:  Education details: Discussed eval findings, rehab potential and POC Person educated: Patient Education method: Explanation Education comprehension: verbalized understanding     HOME EXERCISE PROGRAM: Access Code: [email or web address] URL: [email or web address] Date: 04/19/2021 Prepared by: Sharlynn Oliphant  Exercises Supine Piriformis Stretch Pulling Heel to Hip - 2 x daily - 7 x weekly - 1 sets - 3 reps - 30s hold Supine Lower Trunk Rotation - 2 x daily - 7 x weekly - 1 sets - 3 reps - 30s hold Supine March - 2 x daily - 7 x weekly - 2 sets - 10 reps Curl Up with Arms Crossed - 2 x daily - 7 x weekly - 2 sets - 10 reps Quadruped Alternating Leg Extensions - 2 x daily - 7 x weekly - 3 sets - 10 reps - 30s hold Supine Shoulder Flexion with Dowel - 2 x daily - 7 x weekly - 1 sets - 30 reps Supine Shoulder External Internal Rotation AAROM with Dowel - 2 x daily - 7 x weekly - 1 sets - 30 reps Standing Shoulder Scaption - 2 x daily - 7 x weekly - 1 sets - 30 reps    ASSESSMENT:   CLINICAL IMPRESSION: Increased lumbar spring when mobilized, slightly improved shoulder  AROM with pain at end range only, restricted mobility in capsular pattern, reviewed HEP for accuracy and form, could benefit from more intense core work, continue to encourage AROM   REHAB POTENTIAL: Good   CLINICAL DECISION MAKING: Stable/uncomplicated   EVALUATION COMPLEXITY: Low     GOALS: Goals reviewed with patient? Yes   SHORT TERM GOALS:   STG Name Target Date Goal status  1 Patient to demo independence in HEP Baseline: [email or web address] 04/28/2021 INITIAL  2 Patient to report 3/10 diffuse  low back discomfort with prolonged sitting/driving Baseline: 3/35 pain with prolonged sitting/driving 45/62/5638 INITIAL  3 Patient to reach b/h back with 1/10 pain Baseline: 4/0 intermittent pain when reaching b/h back 04/28/2021 INITIAL  LONG TERM GOALS:    LTG Name Target Date Goal status  1 Patient to demo 75% trunk mobility in flexion, extension and B sidebending, AROM Baseline: AROM 50% flexion and B sidebending, 25% extension 05/19/2021 INITIAL  2 Patient to increase B LE strength to 4+/5 in hip flexion, extension and abduction, knee flexion and extension and ankle PF/DF Baseline: 4/5 strength throughout 05/19/2021  INITIAL  3 Patient to report 2/10 pain levels following 1 hour driving/sitting Baseline: 4/10 pain following driving sitting 9/37/3428 INITIAL  4 Improve overall core strength to 4/5 as evidenced by less effort noted with positional changes especially supine/sit transitions Baseline: Moderate effort 05/19/2021 INITIAL  5 Patient to demo moderate restrictions to lumbar spring testing at L5-2 Baseline: severe restrictions to spring testing L5-2 05/19/2021 INITIAL       6  Patient to regain 160d AROM L shoulder flexion, abduction, 70d IR/ER Baseline: 145d L flexion, 120d abduction, 70d IR, 50d ER and 40d @90d  abduction 05/19/2021  INITIAL    7  Patient to demo negative impingement signs        PLAN: PT FREQUENCY: 2x/week   PT DURATION: 4 weeks   PLANNED INTERVENTIONS: Therapeutic exercises, Therapeutic activity, Neuro Muscular re-education, Balance training, Gait training, Patient/Family education, and Joint mobilization   PLAN FOR NEXT SESSION: HEP f/u, manual mobilization to lumbar segments, initiate LE and core strengthening through PREs and functional patterns, L shoulder AROM And PNF patterns, assess STG progress     Lanice Shirts PT 04/25/2021, 1:57 PM

## 2021-04-27 DIAGNOSIS — R0989 Other specified symptoms and signs involving the circulatory and respiratory systems: Secondary | ICD-10-CM | POA: Diagnosis not present

## 2021-04-27 DIAGNOSIS — J029 Acute pharyngitis, unspecified: Secondary | ICD-10-CM | POA: Diagnosis not present

## 2021-04-27 DIAGNOSIS — R059 Cough, unspecified: Secondary | ICD-10-CM | POA: Diagnosis not present

## 2021-04-27 DIAGNOSIS — R067 Sneezing: Secondary | ICD-10-CM | POA: Diagnosis not present

## 2021-04-28 ENCOUNTER — Ambulatory Visit: Payer: Medicare HMO

## 2021-05-01 ENCOUNTER — Telehealth: Payer: Medicare HMO | Admitting: Family Medicine

## 2021-05-01 DIAGNOSIS — J208 Acute bronchitis due to other specified organisms: Secondary | ICD-10-CM | POA: Diagnosis not present

## 2021-05-01 MED ORDER — BENZONATATE 100 MG PO CAPS: 100.0000 mg | ORAL_CAPSULE | Freq: Two times a day (BID) | ORAL | 0 refills | Status: DC | PRN

## 2021-05-01 MED ORDER — PREDNISONE 10 MG (21) PO TBPK: ORAL_TABLET | ORAL | 0 refills | Status: DC

## 2021-05-01 NOTE — Progress Notes (Signed)
We are sorry that you are not feeling well.  Here is how we plan to help!  Based on your presentation I believe you most likely have A cough due to a virus.  This is called viral bronchitis and is best treated by rest, plenty of fluids and control of the cough.  You may use Ibuprofen or Tylenol as directed to help your symptoms.     In addition you may use A prescription cough medication called Tessalon Perles 100mg . You may take 1-2 capsules every 8 hours as needed for your cough.  Prednisone 10 mg daily for 6 days (see taper instructions below)  From your responses in the eVisit questionnaire you describe inflammation in the upper respiratory tract which is causing a significant cough.  This is commonly called Bronchitis and has four common causes:   Allergies Viral Infections Acid Reflux Bacterial Infection Allergies, viruses and acid reflux are treated by controlling symptoms or eliminating the cause. An example might be a cough caused by taking certain blood pressure medications. You stop the cough by changing the medication. Another example might be a cough caused by acid reflux. Controlling the reflux helps control the cough.  USE OF BRONCHODILATOR ("RESCUE") INHALERS: There is a risk from using your bronchodilator too frequently.  The risk is that over-reliance on a medication which only relaxes the muscles surrounding the breathing tubes can reduce the effectiveness of medications prescribed to reduce swelling and congestion of the tubes themselves.  Although you feel brief relief from the bronchodilator inhaler, your asthma may actually be worsening with the tubes becoming more swollen and filled with mucus.  This can delay other crucial treatments, such as oral steroid medications. If you need to use a bronchodilator inhaler daily, several times per day, you should discuss this with your provider.  There are probably better treatments that could be used to keep your asthma under control.      HOME CARE Only take medications as instructed by your medical team. Complete the entire course of an antibiotic. Drink plenty of fluids and get plenty of rest. Avoid close contacts especially the very young and the elderly Cover your mouth if you cough or cough into your sleeve. Always remember to wash your hands A steam or ultrasonic humidifier can help congestion.   GET HELP RIGHT AWAY IF: You develop worsening fever. You become short of breath You cough up blood. Your symptoms persist after you have completed your treatment plan MAKE SURE YOU  Understand these instructions. Will watch your condition. Will get help right away if you are not doing well or get worse.    Thank you for choosing an e-visit.  Your e-visit answers were reviewed by a board certified advanced clinical practitioner to complete your personal care plan. Depending upon the condition, your plan could have included both over the counter or prescription medications.  Please review your pharmacy choice. Make sure the pharmacy is open so you can pick up prescription now. If there is a problem, you may contact your provider through [employer] and have the prescription routed to another pharmacy.  Your safety is important to [place]. If you have drug allergies check your prescription carefully.   For the next 24 hours you can use MyChart to ask questions about today's visit, request a non-urgent call back, or ask for a work or school excuse. You will get an email in the next two days asking about your experience. I hope that your e-visit has been  valuable and will speed your recovery.  I provided 5 minutes of non face-to-face time during this encounter for chart review, medication and order placement, as well as and documentation.

## 2021-05-09 DIAGNOSIS — B9689 Other specified bacterial agents as the cause of diseases classified elsewhere: Secondary | ICD-10-CM | POA: Diagnosis not present

## 2021-05-09 DIAGNOSIS — J069 Acute upper respiratory infection, unspecified: Secondary | ICD-10-CM | POA: Diagnosis not present

## 2021-05-10 ENCOUNTER — Ambulatory Visit: Payer: Medicare HMO

## 2021-05-12 ENCOUNTER — Other Ambulatory Visit: Payer: Self-pay

## 2021-05-12 ENCOUNTER — Ambulatory Visit: Payer: Medicare HMO | Attending: Family Medicine

## 2021-05-12 DIAGNOSIS — M6281 Muscle weakness (generalized): Secondary | ICD-10-CM | POA: Insufficient documentation

## 2021-05-12 DIAGNOSIS — M25512 Pain in left shoulder: Secondary | ICD-10-CM | POA: Insufficient documentation

## 2021-05-12 DIAGNOSIS — G8929 Other chronic pain: Secondary | ICD-10-CM | POA: Insufficient documentation

## 2021-05-12 DIAGNOSIS — M7542 Impingement syndrome of left shoulder: Secondary | ICD-10-CM | POA: Diagnosis not present

## 2021-05-12 DIAGNOSIS — M545 Low back pain, unspecified: Secondary | ICD-10-CM | POA: Diagnosis not present

## 2021-05-12 NOTE — Therapy (Signed)
OUTPATIENT PHYSICAL THERAPY TREATMENT NOTE   Patient Name: Edwin [name] MRN: [id number] DOB:21-Nov-1945, 76 y.o., male Today's Date: 05/12/2021  PCP: [name], MD REFERRING PROVIDER: Lyndal Pulley, DO   PT End of Session - 05/12/21 0959     Visit Number 4    Number of Visits 9    Date for PT Re-Evaluation 05/19/21    Authorization Type [employer] MCR    Authorization Time Period 04/14/21-05/19/21    Progress Note Due on Visit 9    PT Start Time 1000    PT Stop Time 1045    PT Time Calculation (min) 45 min    Activity Tolerance Patient tolerated treatment well    Behavior During Therapy [facility] for tasks assessed/performed             Past Medical History:  Diagnosis Date   BPH (benign prostatic hypertrophy)    Cancer ([place])    Basal cell of face, [name]   Diverticulosis    Rosanna Randy syndrome    Hyperglycemia    Hyperlipidemia    NMR Lipoprofile 2005; LDL 146 (2007/1330), HDL 38, TG 169.  LDL goal = <100; Framingham Study LDL goal =<130   Hypertension    OSA on CPAP    Tubular adenoma of colon 01/19/08   Past Surgical History:  Procedure Laterality Date   COLONOSCOPY  2014   neg; [facility]   COLONOSCOPY W/ POLYPECTOMY  2004 & 2009   Tics; [facility]   KNEE ARTHROSCOPY     [name], left knee   TONSILLECTOMY     WISDOM TOOTH EXTRACTION     Patient Active Problem List   Diagnosis Date Noted   Reactive airway disease with wheezing 08/22/2020   Cough 08/22/2020   Left shoulder pain 05/31/2020   Pes anserine bursitis 05/05/2020   Onychomycosis of right great toe 10/27/2019   Piriformis syndrome of left side 02/05/2019   CAD (coronary artery disease) 02/03/2019   Greater trochanteric bursitis 01/01/2019   Degenerative arthritis of left knee 05/08/2018   Prediabetes 03/01/2018   Muscle cramping 02/27/2018   Subclinical hypothyroidism 02/27/2018   Seasonal allergic rhinitis due to pollen 09/09/2017   [facility] (acromioclavicular) arthritis  08/14/2017   Tear of MCL (medial collateral ligament) of knee, right, initial encounter 06/05/2017   Piriformis syndrome of right side 02/19/2017   Lower back pain 02/15/2017   Acute bronchitis 10/09/2016   Patellofemoral arthritis of right knee 12/30/2014   Morbid obesity ([place])    Nonspecific abnormal electrocardiogram (ECG) (EKG) 08/18/2014   OSA (obstructive sleep apnea) 06/02/2014   Achilles tendinosis 12/26/2012   RBBB 05/20/2012   Hyperlipidemia 03/03/2008   Essential hypertension 03/03/2008   History of colonic polyps 03/03/2008   History of basal cell cancer 05/26/2007   GILBERT'S SYNDROME 05/26/2007   DIVERTICULOSIS, COLON 05/26/2007   BPH (benign prostatic hyperplasia) 05/26/2007    REFERRING DIAG: L shoulder pain, Chronic midline low back pain without sciatica  Muscle weakness (generalized)  Impingement syndrome of left shoulder  Chronic left shoulder pain  THERAPY DIAG:  Chronic midline low back pain without sciatica  Muscle weakness (generalized)  Impingement syndrome of left shoulder  Chronic left shoulder pain  PERTINENT HISTORY:   Left shoulder pain - Lyndal Pulley, DO at 02/14/2021 10:20 AM  Status: Written  Related Problem: Left shoulder pain    Patient is having increasing limited range of motion.  Patient states that the pain is starting to affect daily activities.  Patient does have a mild positive empty can which is new as well.  Discussed with patient again at great length.  I discussed icing regimen and home exercises.  Discussed which activities to do which wants to avoid.  We did discuss that with patient failing all other conservative therapy including formal physical therapy I do feel that MRI, MR arthrogram would be necessary at this time.  Follow-up with me after imaging to discuss further.     Lower back pain - Lyndal Pulley, DO at 02/14/2021 10:22 AM  Status: Written  Related Problem: Lower back pain    Lower back pain that he  has had for years.  Patient's x-rays have shown some mild progression noted especially with the advanced L3-L4 and L4-L5 arthropathy and degenerative disc disease patient has had this pain for 4 years but slowly progressing and not responding to conservative therapy.  Do feel MRI could be beneficial to further evaluate depending on findings we can discuss the possibility of epidurals but we do think that ankle pain avoid any surgical intervention.  If patient has any worsening pain, bowel or bladder incontinence, or with depending on findings we will discuss further management       PRECAUTIONS: None  SUBJECTIVE: Has been dealing with URI/cough/cold symptoms, back and shoulder symptoms have markedly subsided as he has not been as mobile.  PAIN:  Are you having pain? Yes VAS scale: 1/10 L shoulder, 1/10 low back Pain location: L shoulder, low back Pain orientation: Left  PAIN TYPE: dull Pain description: intermittent  Aggravating factors: reaching behind Relieving factors: rest    OBJECTIVE: AROM L shoulder in supine 155d flexion, 160 abd in scapular plane, 75d ER and 65d IR(05/12/21)  TODAY'S TREATMENT:  TODAY'S TREATMENT  Nustep seat 9, L2 arms 8 x6 min Supine PNF D1 F/E x20 to increase ROM Corner pec stretch and corner abduction stertch 30s x3  L shoulder mobs in posterior and inferior directions to regain AROM in flexion and abduction followed by PROM into IR.ER, F, ABD (Treatment modified due to coughing spells when supine, used wedge to accommodate position)      PATIENT EDUCATION:  Education details: Discussed eval findings, rehab potential and POC Person educated: Patient Education method: Explanation Education comprehension: verbalized understanding     HOME EXERCISE PROGRAM: Access Code: [email or web address] URL: [email or web address] Date: 04/19/2021 Prepared by: Sharlynn Oliphant  Exercises Supine Piriformis Stretch Pulling Heel to Hip - 2 x daily - 7 x weekly -  1 sets - 3 reps - 30s hold Supine Lower Trunk Rotation - 2 x daily - 7 x weekly - 1 sets - 3 reps - 30s hold Supine March - 2 x daily - 7 x weekly - 2 sets - 10 reps Curl Up with Arms Crossed - 2 x daily - 7 x weekly - 2 sets - 10 reps Quadruped Alternating Leg Extensions - 2 x daily - 7 x weekly - 3 sets - 10 reps - 30s hold Supine Shoulder Flexion with Dowel - 2 x daily - 7 x weekly - 1 sets - 30 reps Supine Shoulder External Internal Rotation AAROM with Dowel - 2 x daily - 7 x weekly - 1 sets - 30 reps Standing Shoulder Scaption - 2 x daily - 7 x weekly - 1 sets - 30 reps    ASSESSMENT:   CLINICAL IMPRESSION: No lumbar issues to address today per patient, todays focus was regaining L shoulder mobility through joint mobs  follwed by  PROM to assess benefit, ROM markedly improved w/o pain allowing only minimally painful AROM.  Added corner pec and abd stretch to HEP   REHAB POTENTIAL: Good   CLINICAL DECISION MAKING: Stable/uncomplicated   EVALUATION COMPLEXITY: Low     GOALS: Goals reviewed with patient? Yes   SHORT TERM GOALS:   STG Name Target Date Goal status  1 Patient to demo independence in HEP Baseline: [email or web address] 04/28/2021 05/12/21  Able to demo HEP to PT, goal met  2 Patient to report 3/10 diffuse  low back discomfort with prolonged sitting/driving Baseline: 5/03 pain with prolonged sitting/driving 88/82/8003 08/14/15 Goal met  3 Patient to reach b/h back with 1/10 pain Baseline: 4/10 intermittent pain when reaching b/h back 04/28/2021 05/12/21 1/10 pain when reaching b/h, goal met  LONG TERM GOALS:    LTG Name Target Date Goal status  1 Patient to demo 75% trunk mobility in flexion, extension and B sidebending, AROM Baseline: AROM 50% flexion and B sidebending, 25% extension 05/19/2021 INITIAL  2 Patient to increase B LE strength to 4+/5 in hip flexion, extension and abduction, knee flexion and extension and ankle PF/DF Baseline: 4/5 strength throughout 05/19/2021 INITIAL   3 Patient to report 2/10 pain levels following 1 hour driving/sitting Baseline: 4/10 pain following driving sitting 01/20/568 INITIAL  4 Improve overall core strength to 4/5 as evidenced by less effort noted with positional changes especially supine/sit transitions Baseline: Moderate effort 05/19/2021 INITIAL  5 Patient to demo moderate restrictions to lumbar spring testing at L5-2 Baseline: severe restrictions to spring testing L5-2 05/19/2021 INITIAL       6  Patient to regain 160d AROM L shoulder flexion, abduction, 70d IR/ER Baseline: 145d L flexion, 120d abduction, 70d IR, 50d ER and 40d _0  abduction 05/19/2021  INITIAL    7  Patient to demo negative impingement signs        PLAN: PT FREQUENCY: 2x/week   PT DURATION: 4 weeks   PLANNED INTERVENTIONS: Therapeutic exercises, Therapeutic activity, Neuro Muscular re-education, Balance training, Gait training, Patient/Family education, and Joint mobilization   PLAN FOR NEXT SESSION: HEP on lumbar symptoms, core strengthening through PREs and functional patterns, L shoulder AROM And PNF patterns, joint mobs posterior and inferior to regain shoulder mobility     Jacqulynn Cadet Clennon Nasca PT 05/12/2021, 10:01 AM

## 2021-05-14 NOTE — Progress Notes (Signed)
Subjective:    Patient ID: Edwin [name], male    DOB: 1945/10/07, 76 y.o.   MRN: [id number]  This visit occurred during the SARS-CoV-2 public health emergency.  Safety protocols were in place, including screening questions prior to the visit, additional usage of staff PPE, and extensive cleaning of exam room while observing appropriate contact time as indicated for disinfecting solutions.    HPI The patient is here for an acute visit for coughing  He has been seen for this a few times -   [facility] 04/27/21 - rapid flu and covid tests neg.  Diagnosed with viral URI.  given steroid injection, advised daily antihistamine, tylenol, ibuprofen and prescribed Tussionex  E-visit 05/01/21 - diagnosed with cough due to virus.  Prescribed prednisone taper 6 days, tessalon perles  Urgent care 05/09/21 - diagnosed bacterial URI, prescribed augmentin bid x 7 days    He is taking xyzal.  He still has a cough - it is a dry cough primarily.  The cough is better.  He denies any true wheezing, but may have had a little.  He just finish the prednisone taper and he did not feel that that helped that much, but thinks the antibiotic may have helped.    Medications and allergies reviewed with patient and updated if appropriate.  Patient Active Problem List   Diagnosis Date Noted   Reactive airway disease with wheezing 08/22/2020   Cough 08/22/2020   Left shoulder pain 05/31/2020   Pes anserine bursitis 05/05/2020   Onychomycosis of right great toe 10/27/2019   Piriformis syndrome of left side 02/05/2019   CAD (coronary artery disease) 02/03/2019   Greater trochanteric bursitis 01/01/2019   Degenerative arthritis of left knee 05/08/2018   Prediabetes 03/01/2018   Muscle cramping 02/27/2018   Subclinical hypothyroidism 02/27/2018   Seasonal allergic rhinitis due to pollen 09/09/2017   [facility] (acromioclavicular) arthritis 08/14/2017   Tear of MCL (medial collateral ligament) of knee, right, initial  encounter 06/05/2017   Piriformis syndrome of right side 02/19/2017   Lower back pain 02/15/2017   Acute bronchitis 10/09/2016   Patellofemoral arthritis of right knee 12/30/2014   Morbid obesity ([place])    Nonspecific abnormal electrocardiogram (ECG) (EKG) 08/18/2014   OSA (obstructive sleep apnea) 06/02/2014   Achilles tendinosis 12/26/2012   RBBB 05/20/2012   Hyperlipidemia 03/03/2008   Essential hypertension 03/03/2008   History of colonic polyps 03/03/2008   History of basal cell cancer 05/26/2007   GILBERT'S SYNDROME 05/26/2007   DIVERTICULOSIS, COLON 05/26/2007   BPH (benign prostatic hyperplasia) 05/26/2007    Current Outpatient Medications on File Prior to Visit  Medication Sig Dispense Refill   amoxicillin-clavulanate (AUGMENTIN) 875-125 MG tablet Take 1 tablet by mouth 2 (two) times daily.     aspirin 81 MG tablet Take 81 mg by mouth daily.     atorvastatin (LIPITOR) 10 MG tablet TAKE 1 TABLET EVERY OTHER DAY 45 tablet 0   azelastine (ASTELIN) 0.1 % nasal spray Place into the nose.     Cholecalciferol (VITAMIN D3) 1000 UNITS CAPS Take 1 capsule by mouth daily.     Cyanocobalamin (VITAMIN B-12 PO) Take by mouth as directed.     metoprolol succinate (TOPROL-XL) 50 MG 24 hr tablet TAKE 1 TABLET ONCE DAILY. TAKE WITH OR IMMEDIATELY FOLLOWING A MEAL. 90 tablet 3   Multiple Vitamin (MULTIVITAMIN) tablet Take 1 tablet by mouth daily.     naproxen sodium (ALEVE) 220 MG tablet Take 220 mg by mouth daily as  needed.     nitroGLYCERIN (NITROSTAT) 0.4 MG SL tablet Place 1 tablet (0.4 mg total) under the tongue every 5 (five) minutes as needed for chest pain. 25 tablet 3   Turmeric (QC TUMERIC COMPLEX PO) Take by mouth daily.     predniSONE (STERAPRED UNI-PAK 21 TAB) 10 MG (21) TBPK tablet Take as directed (Patient not taking: Reported on 05/15/2021) 21 tablet 0   No current facility-administered medications on file prior to visit.    Past Medical History:  Diagnosis Date   BPH  (benign prostatic hypertrophy)    Cancer ([place])    Basal cell of face, [name]   Diverticulosis    Rosanna Randy syndrome    Hyperglycemia    Hyperlipidemia    NMR Lipoprofile 2005; LDL 146 (2007/1330), HDL 38, TG 169.  LDL goal = <100; Framingham Study LDL goal =<130   Hypertension    OSA on CPAP    Tubular adenoma of colon 01/19/08    Past Surgical History:  Procedure Laterality Date   COLONOSCOPY  2014   neg; [facility]   COLONOSCOPY W/ POLYPECTOMY  2004 & 2009   Tics; [facility]   KNEE ARTHROSCOPY     [name], left knee   TONSILLECTOMY     WISDOM TOOTH EXTRACTION      Social History   Socioeconomic History   Marital status: Married    Spouse name: Not on file   Number of children: 2   Years of education: 18   Highest education level: Not on file  Occupational History   Occupation: Pharmacist, hospital // Retired    Fish farm manager: [employer]  Tobacco Use   Smoking status: Never   Smokeless tobacco: Never  Vaping Use   Vaping Use: Never used  Substance and Sexual Activity   Alcohol use: Yes    Alcohol/week: 0.0 standard drinks    Comment: socially, 3-5 beers/week   Drug use: No   Sexual activity: Yes  Other Topics Concern   Not on file  Social History Narrative   Fun: Travel, read, movies, concerts    Denies abuse and feels safe at home.    Social Determinants of Health   Financial Resource Strain: Not on file  Food Insecurity: Not on file  Transportation Needs: Not on file  Physical Activity: Not on file  Stress: Not on file  Social Connections: Not on file    Family History  Problem Relation Age of Onset   Diverticulosis Mother    Stroke Mother        late 37s   Parkinsonism Mother    Diabetes Father    Hypertension Father    Heart disease Father 75       CABG   Cancer Brother        lymphoma   Appendicitis Brother        ruptured   Asthma Brother        childhood   Stroke Paternal Grandmother 71   Heart attack  Maternal Grandmother 80       questionable   Heart attack Maternal Grandfather 20   Parkinsonism Maternal Grandfather    Hypertension Sister    Appendicitis Sister        ruptured   Colon cancer Neg Hx    Esophageal cancer Neg Hx    Rectal cancer Neg Hx    Stomach cancer Neg Hx     Review of Systems  Constitutional:  Negative for chills and  fever.  HENT:  Negative for congestion, ear pain, postnasal drip, sinus pressure and sneezing.   Respiratory:  Positive for cough (dry) and wheezing (? wheeze or not). Negative for chest tightness and shortness of breath.   Neurological:  Negative for light-headedness and headaches.      Objective:   Vitals:   05/15/21 1043  BP: (!) 146/78  Pulse: 70  Temp: 98.3 F (36.8 C)  SpO2: 97%   BP Readings from Last 3 Encounters:  05/15/21 (!) 146/78  04/12/21 140/74  03/10/21 (!) 144/86   Wt Readings from Last 3 Encounters:  05/15/21 250 lb 9.6 oz (113.7 kg)  04/12/21 257 lb 6.4 oz (116.8 kg)  03/10/21 256 lb (116.1 kg)   Body mass index is 37.01 kg/m.   Physical Exam    GENERAL APPEARANCE: Appears stated age, well appearing, NAD EYES: conjunctiva clear, no icterus LUNGS: Unlabored breathing, good air entry bilaterally, no crackles, occasional end expiratory wheeze that is minimal CARDIOVASCULAR: Normal S1,S2 , no edema SKIN: Warm, dry      Assessment & Plan:    See Problem List for Assessment and Plan of chronic medical problems.

## 2021-05-15 ENCOUNTER — Other Ambulatory Visit: Payer: Self-pay

## 2021-05-15 ENCOUNTER — Encounter: Payer: Self-pay | Admitting: Internal Medicine

## 2021-05-15 ENCOUNTER — Ambulatory Visit (INDEPENDENT_AMBULATORY_CARE_PROVIDER_SITE_OTHER): Payer: Medicare HMO | Admitting: Internal Medicine

## 2021-05-15 DIAGNOSIS — J069 Acute upper respiratory infection, unspecified: Secondary | ICD-10-CM | POA: Insufficient documentation

## 2021-05-15 DIAGNOSIS — J4520 Mild intermittent asthma, uncomplicated: Secondary | ICD-10-CM

## 2021-05-15 MED ORDER — ALBUTEROL SULFATE HFA 108 (90 BASE) MCG/ACT IN AERS: 2.0000 | INHALATION_SPRAY | Freq: Four times a day (QID) | RESPIRATORY_TRACT | 0 refills | Status: AC | PRN

## 2021-05-15 MED ORDER — AZITHROMYCIN 250 MG PO TABS: ORAL_TABLET | ORAL | 0 refills | Status: DC

## 2021-05-15 MED ORDER — HYDROCOD POLST-CPM POLST ER 10-8 MG/5ML PO SUER: 5.0000 mL | Freq: Two times a day (BID) | ORAL | 0 refills | Status: DC | PRN

## 2021-05-15 NOTE — Assessment & Plan Note (Signed)
Acute His symptoms are consistent with a URI-possibly bacterial in nature even though it sounds more viral since he did have good response to antibiotic He also had an element of reactive airway disease I think he has turned the corner at this time and he is starting to feel better, but still has that lingering cough Tussionex cough syrup at nighttime Complete Augmentin-he has 2 more days of this Trial of albuterol inhaler every 6 hours as needed for cough and scant wheeze

## 2021-05-15 NOTE — Assessment & Plan Note (Signed)
Acute Symptoms consistent with reactive airway disease with minimal wheezing at this point on exam-secondary to URI I think he has turned the corner at this time and he is starting to feel better, but still has that lingering cough Tussionex cough syrup at nighttime Complete Augmentin-he has 2 more days of this Trial of albuterol inhaler every 6 hours as needed for cough and scant wheeze

## 2021-05-15 NOTE — Patient Instructions (Addendum)
° ° ° °  Medications changes include :   tussionex cough syrup, albuterol inhaler    Your prescription(s) have been submitted to your pharmacy. Please take as directed and contact our office if you believe you are having problem(s) with the medication(s).

## 2021-05-22 ENCOUNTER — Ambulatory Visit: Payer: Medicare HMO

## 2021-05-22 ENCOUNTER — Other Ambulatory Visit: Payer: Self-pay

## 2021-05-22 DIAGNOSIS — M545 Low back pain, unspecified: Secondary | ICD-10-CM | POA: Diagnosis not present

## 2021-05-22 DIAGNOSIS — G8929 Other chronic pain: Secondary | ICD-10-CM | POA: Diagnosis not present

## 2021-05-22 DIAGNOSIS — M6281 Muscle weakness (generalized): Secondary | ICD-10-CM

## 2021-05-22 DIAGNOSIS — M7542 Impingement syndrome of left shoulder: Secondary | ICD-10-CM

## 2021-05-22 DIAGNOSIS — M25512 Pain in left shoulder: Secondary | ICD-10-CM | POA: Diagnosis not present

## 2021-05-22 NOTE — Therapy (Signed)
OUTPATIENT PHYSICAL THERAPY TREATMENT NOTE   Patient Name: Edwin [name] MRN: [id number] DOB:[birth year], 76 y.o., male Today's Date: 05/22/2021  PCP: [name], MD REFERRING PROVIDER: [name], MD   PT End of Session - 05/22/21 1445     Visit Number 5    Number of Visits 9    Date for PT Re-Evaluation 05/19/21    Authorization Type [employer] MCR    Authorization Time Period 04/14/21-05/19/21    Progress Note Due on Visit 9    PT Start Time 2119    PT Stop Time 1530    PT Time Calculation (min) 45 min    Activity Tolerance Patient tolerated treatment well    Behavior During Therapy [facility] for tasks assessed/performed             Past Medical History:  Diagnosis Date   BPH (benign prostatic hypertrophy)    Cancer ([place])    Basal cell of face, [name]   Diverticulosis    Rosanna Randy syndrome    Hyperglycemia    Hyperlipidemia    NMR Lipoprofile 2005; LDL 146 (2007/1330), HDL 38, TG 169.  LDL goal = <100; Framingham Study LDL goal =<130   Hypertension    OSA on CPAP    Tubular adenoma of colon 01/19/08   Past Surgical History:  Procedure Laterality Date   COLONOSCOPY  2014   neg; [facility]   COLONOSCOPY W/ POLYPECTOMY  2004 & 2009   Tics; [facility]   KNEE ARTHROSCOPY     [name], left knee   TONSILLECTOMY     WISDOM TOOTH EXTRACTION     Patient Active Problem List   Diagnosis Date Noted   URI (upper respiratory infection) 05/15/2021   Reactive airway disease with wheezing 08/22/2020   Cough 08/22/2020   Left shoulder pain 05/31/2020   Pes anserine bursitis 05/05/2020   Onychomycosis of right great toe 10/27/2019   Piriformis syndrome of left side 02/05/2019   CAD (coronary artery disease) 02/03/2019   Greater trochanteric bursitis 01/01/2019   Degenerative arthritis of left knee 05/08/2018   Prediabetes 03/01/2018   Muscle cramping 02/27/2018   Subclinical hypothyroidism 02/27/2018   Seasonal allergic rhinitis due to pollen  09/09/2017   [facility] (acromioclavicular) arthritis 08/14/2017   Tear of MCL (medial collateral ligament) of knee, right, initial encounter 06/05/2017   Piriformis syndrome of right side 02/19/2017   Lower back pain 02/15/2017   Acute bronchitis 10/09/2016   Patellofemoral arthritis of right knee 12/30/2014   Morbid obesity ([place])    Nonspecific abnormal electrocardiogram (ECG) (EKG) 08/18/2014   OSA (obstructive sleep apnea) 06/02/2014   Achilles tendinosis 12/26/2012   RBBB 05/20/2012   Hyperlipidemia 03/03/2008   Essential hypertension 03/03/2008   History of colonic polyps 03/03/2008   History of basal cell cancer 05/26/2007   GILBERT'S SYNDROME 05/26/2007   DIVERTICULOSIS, COLON 05/26/2007   BPH (benign prostatic hyperplasia) 05/26/2007    REFERRING DIAG: L shoulder pain, Chronic midline low back pain without sciatica  Muscle weakness (generalized)  Impingement syndrome of left shoulder  Chronic left shoulder pain  THERAPY DIAG:  Chronic midline low back pain without sciatica  Muscle weakness (generalized)  Impingement syndrome of left shoulder  Chronic left shoulder pain  PERTINENT HISTORY:   Left shoulder pain - Lyndal Pulley, DO at 02/14/2021 10:20 AM  Status: Written  Related Problem: Left shoulder pain    Patient is having increasing limited range of motion.  Patient states that the  pain is starting to affect daily activities.  Patient does have a mild positive empty can which is new as well.  Discussed with patient again at great length.  I discussed icing regimen and home exercises.  Discussed which activities to do which wants to avoid.  We did discuss that with patient failing all other conservative therapy including formal physical therapy I do feel that MRI, MR arthrogram would be necessary at this time.  Follow-up with me after imaging to discuss further.     Lower back pain - Lyndal Pulley, DO at 02/14/2021 10:22 AM  Status: Written  Related  Problem: Lower back pain    Lower back pain that he has had for years.  Patient's x-rays have shown some mild progression noted especially with the advanced L3-L4 and L4-L5 arthropathy and degenerative disc disease patient has had this pain for 4 years but slowly progressing and not responding to conservative therapy.  Do feel MRI could be beneficial to further evaluate depending on findings we can discuss the possibility of epidurals but we do think that ankle pain avoid any surgical intervention.  If patient has any worsening pain, bowel or bladder incontinence, or with depending on findings we will discuss further management       PRECAUTIONS: None  SUBJECTIVE: Low back has not ben bothering him and feels he can self manage for now.  Has not yet had to drive for long distances to assess function.  L shoulder mobility and pain continue to improve and he is now able to access another belt loop   PAIN:  Are you having pain? Yes VAS scale: 1/10 L shoulder, 1/10 low back Pain location: L shoulder, low back Pain orientation: Left  PAIN TYPE: dull Pain description: intermittent  Aggravating factors: reaching behind Relieving factors: rest    OBJECTIVE: AROM L shoulder in supine 155d flexion, 160 abd in scapular plane, 75d ER and 65d IR(05/12/21)  TODAY'S TREATMENT: [facility] Adult PT Treatment:                                                DATE: 05/22/21 Therapeutic Exercise: UBE 3/3 L1.5 Supine L shoulder flexion 1# x30 Supine L shoulder press and protract 1# x30 Sidelie ER 1# x30 Sidelie abduction 1# x30 Horizontal abd RTB x15 Horizontal abduction alternating Ues RTB 15 ea. Supine alternation flexion RTB x 15 Manual Therapy: Posterior and inferior shoulder glides/mobs 3x10 PNF D1 F/E 30x   TODAY'S TREATMENT  Nustep seat 9, L2 arms 8 x6 min Supine PNF D1 F/E x20 to increase ROM Corner pec stretch and corner abduction stertch 30s x3  L shoulder mobs in posterior and inferior  directions to regain AROM in flexion and abduction followed by PROM into IR.ER, F, ABD (Treatment modified due to coughing spells when supine, used wedge to accommodate position)      PATIENT EDUCATION:  Education details: Discussed eval findings, rehab potential and POC Person educated: Patient Education method: Explanation Education comprehension: verbalized understanding     HOME EXERCISE PROGRAM: Access Code: [email or web address] URL: [email or web address] Date: 04/19/2021 Prepared by: Sharlynn Oliphant  Exercises Supine Piriformis Stretch Pulling Heel to Hip - 2 x daily - 7 x weekly - 1 sets - 3 reps - 30s hold Supine Lower Trunk Rotation - 2 x daily - 7 x weekly - 1 sets - 3 reps -  30s hold Supine March - 2 x daily - 7 x weekly - 2 sets - 10 reps Curl Up with Arms Crossed - 2 x daily - 7 x weekly - 2 sets - 10 reps Quadruped Alternating Leg Extensions - 2 x daily - 7 x weekly - 3 sets - 10 reps - 30s hold Supine Shoulder Flexion with Dowel - 2 x daily - 7 x weekly - 1 sets - 30 reps Supine Shoulder External Internal Rotation AAROM with Dowel - 2 x daily - 7 x weekly - 1 sets - 30 reps Standing Shoulder Scaption - 2 x daily - 7 x weekly - 1 sets - 30 reps    ASSESSMENT:   CLINICAL IMPRESSION: Low back has not been bothering him and feels he can self manage for now.  Has not yet had to drive for long distances to assess function.  L shoulder mobility and pain continue to improve and he is now able to access another belt loop.  LTG progress assessed and progress noted.   REHAB POTENTIAL: Good   CLINICAL DECISION MAKING: Stable/uncomplicated   EVALUATION COMPLEXITY: Low     GOALS: Goals reviewed with patient? Yes   SHORT TERM GOALS:   STG Name Target Date Goal status  1 Patient to demo independence in HEP Baseline: [email or web address] 04/28/2021 05/12/21  Able to demo HEP to PT, goal met  2 Patient to report 3/10 diffuse  low back discomfort with prolonged  sitting/driving Baseline: 7/48 pain with prolonged sitting/driving 27/11/8673 08/09/90 Goal met  3 Patient to reach b/h back with 1/10 pain Baseline: 4/10 intermittent pain when reaching b/h back 04/28/2021 05/12/21 1/10 pain when reaching b/h, goal met  LONG TERM GOALS:    LTG Name Target Date Goal status  1 Patient to demo 75% trunk mobility in flexion, extension and B sidebending, AROM Baseline: AROM 50% flexion and B sidebending, 25% extension 05/19/2021 05/22/21 75% AROM in F/E/BSB,  Goal met  2 Patient to increase B LE strength to 4+/5 in hip flexion, extension and abduction, knee flexion and extension and ankle PF/DF Baseline: 4/5 strength throughout 05/19/2021 INITIAL  3 Patient to report 2/10 pain levels following 1 hour driving/sitting Baseline: 4/10 pain following driving sitting 0/02/711 05/22/21  No pain with driving but has not driven an hour at one time yet  4 Improve overall core strength to 4/5 as evidenced by less effort noted with positional changes especially supine/sit transitions Baseline: Moderate effort 05/19/2021 3+/5 core strength  5 Patient to demo moderate restrictions to lumbar spring testing at L5-2 Baseline: severe restrictions to spring testing L5-2 05/19/2021 INITIAL       6  Patient to regain 160d AROM L shoulder flexion, abduction, 70d IR/ER Baseline: 145d L flexion, 120d abduction, 70d IR, 50d ER and 40d @90d  abduction 05/19/2021  INITIAL    7  Patient to demo negative impingement signs 05/19/21   Neers/Kennedy/Hawkins neg for pain    PLAN: PT FREQUENCY: 1x/week   PT DURATION: 4 weeks   PLANNED INTERVENTIONS: Therapeutic exercises, Therapeutic activity, Neuro Muscular re-education, Balance training, Gait training, Patient/Family education, and Joint mobilization   PLAN FOR NEXT SESSION: HEP on lumbar symptoms, prone shoulder strengthening and increased resistance as tolerated     Jacqulynn Cadet Carron Jaggi PT 05/22/2021, 2:46 PM

## 2021-05-24 ENCOUNTER — Ambulatory Visit: Payer: Medicare HMO

## 2021-05-24 ENCOUNTER — Other Ambulatory Visit: Payer: Self-pay

## 2021-05-24 DIAGNOSIS — M545 Low back pain, unspecified: Secondary | ICD-10-CM

## 2021-05-24 DIAGNOSIS — M25512 Pain in left shoulder: Secondary | ICD-10-CM | POA: Diagnosis not present

## 2021-05-24 DIAGNOSIS — G8929 Other chronic pain: Secondary | ICD-10-CM

## 2021-05-24 DIAGNOSIS — M6281 Muscle weakness (generalized): Secondary | ICD-10-CM | POA: Diagnosis not present

## 2021-05-24 DIAGNOSIS — M7542 Impingement syndrome of left shoulder: Secondary | ICD-10-CM

## 2021-05-24 NOTE — Therapy (Signed)
OUTPATIENT PHYSICAL THERAPY TREATMENT NOTE   Patient Name: Edwin [name] MRN: [id number] DOB:17-Jan-1946, 76 y.o., male Today's Date: 05/24/2021  PCP: [name], MD REFERRING PROVIDER: Lyndal Pulley, DO   PT End of Session - 05/24/21 1402     Visit Number 6    Number of Visits 10    Date for PT Re-Evaluation 06/30/21    Authorization Type [employer] MCR    Authorization Time Period 04/14/21-06/30/21    Progress Note Due on Visit 10    PT Start Time 1400    PT Stop Time 1445    PT Time Calculation (min) 45 min    Activity Tolerance Patient tolerated treatment well    Behavior During Therapy [facility] for tasks assessed/performed             Past Medical History:  Diagnosis Date   BPH (benign prostatic hypertrophy)    Cancer ([place])    Basal cell of face, [name]   Diverticulosis    Rosanna Randy syndrome    Hyperglycemia    Hyperlipidemia    NMR Lipoprofile 2005; LDL 146 (2007/1330), HDL 38, TG 169.  LDL goal = <100; Framingham Study LDL goal =<130   Hypertension    OSA on CPAP    Tubular adenoma of colon 01/19/08   Past Surgical History:  Procedure Laterality Date   COLONOSCOPY  2014   neg; [place] [facility]   COLONOSCOPY W/ POLYPECTOMY  2004 & 2009   Tics; [facility]   KNEE ARTHROSCOPY     [name], left knee   TONSILLECTOMY     WISDOM TOOTH EXTRACTION     Patient Active Problem List   Diagnosis Date Noted   URI (upper respiratory infection) 05/15/2021   Reactive airway disease with wheezing 08/22/2020   Cough 08/22/2020   Left shoulder pain 05/31/2020   Pes anserine bursitis 05/05/2020   Onychomycosis of right great toe 10/27/2019   Piriformis syndrome of left side 02/05/2019   CAD (coronary artery disease) 02/03/2019   Greater trochanteric bursitis 01/01/2019   Degenerative arthritis of left knee 05/08/2018   Prediabetes 03/01/2018   Muscle cramping 02/27/2018   Subclinical hypothyroidism 02/27/2018   Seasonal allergic rhinitis due to pollen  09/09/2017   [facility] (acromioclavicular) arthritis 08/14/2017   Tear of MCL (medial collateral ligament) of knee, right, initial encounter 06/05/2017   Piriformis syndrome of right side 02/19/2017   Lower back pain 02/15/2017   Acute bronchitis 10/09/2016   Patellofemoral arthritis of right knee 12/30/2014   Morbid obesity ([place])    Nonspecific abnormal electrocardiogram (ECG) (EKG) 08/18/2014   OSA (obstructive sleep apnea) 06/02/2014   Achilles tendinosis 12/26/2012   RBBB 05/20/2012   Hyperlipidemia 03/03/2008   Essential hypertension 03/03/2008   History of colonic polyps 03/03/2008   History of basal cell cancer 05/26/2007   GILBERT'S SYNDROME 05/26/2007   DIVERTICULOSIS, COLON 05/26/2007   BPH (benign prostatic hyperplasia) 05/26/2007    REFERRING DIAG: L shoulder pain, Chronic midline low back pain without sciatica  Muscle weakness (generalized)  Impingement syndrome of left shoulder  Chronic left shoulder pain  THERAPY DIAG:  Chronic midline low back pain without sciatica  Muscle weakness (generalized)  Impingement syndrome of left shoulder  PERTINENT HISTORY:   Left shoulder pain - Lyndal Pulley, DO at 02/14/2021 10:20 AM  Status: Written  Related Problem: Left shoulder pain    Patient is having increasing limited range of motion.  Patient states that the pain is starting to  affect daily activities.  Patient does have a mild positive empty can which is new as well.  Discussed with patient again at great length.  I discussed icing regimen and home exercises.  Discussed which activities to do which wants to avoid.  We did discuss that with patient failing all other conservative therapy including formal physical therapy I do feel that MRI, MR arthrogram would be necessary at this time.  Follow-up with me after imaging to discuss further.     Lower back pain - Lyndal Pulley, DO at 02/14/2021 10:22 AM  Status: Written  Related Problem: Lower back pain    Lower  back pain that he has had for years.  Patient's x-rays have shown some mild progression noted especially with the advanced L3-L4 and L4-L5 arthropathy and degenerative disc disease patient has had this pain for 4 years but slowly progressing and not responding to conservative therapy.  Do feel MRI could be beneficial to further evaluate depending on findings we can discuss the possibility of epidurals but we do think that ankle pain avoid any surgical intervention.  If patient has any worsening pain, bowel or bladder incontinence, or with depending on findings we will discuss further management       PRECAUTIONS: None  SUBJECTIVE: Low back has not ben bothering him and feels he can self manage for now.  Has not yet had to drive for long distances to assess function.  L shoulder mobility and pain continue to improve and he is now able to access another belt loop   PAIN:  Are you having pain? Yes VAS scale: 1/10 L shoulder, 0/10 low back Pain location: L shoulder, low back Pain orientation: Left  PAIN TYPE: dull Pain description: intermittent  Aggravating factors: reaching behind Relieving factors: rest    OBJECTIVE: AROM L shoulder in supine 155d flexion, 160 abd in scapular plane, 75d ER and 65d IR(05/12/21)  TODAY'S TREATMENT: [facility] Adult PT Treatment:                                                DATE: 05/24/21 Therapeutic Exercise: UBE 3/3 L2.0 Supine L shoulder flexion 2# x15 Supine L shoulder press and protract 2# x15 Sidelie ER 2# x15 Sidelie abduction 2# x15 Prone shoulder flexion 2# 15x Prone shoulder ext 2# 15x Prone scaption 2# x15 Prone hor abd 2# 10/10 Horizontal abd RTB x15 Horizontal abduction alternating Ues RTB 15 ea. Supine alternation flexion RTB x 15 Manual Therapy: Posterior and inferior shoulder glides/mobs 3x10 PNF D1 F/E 30x    [facility] Adult PT Treatment:                                                DATE: 05/22/21 Therapeutic Exercise: UBE 3/3  L1.5 Supine L shoulder flexion 1# x30 Supine L shoulder press and protract 1# x30 Sidelie ER 1# x30 Sidelie abduction 1# x30 Horizontal abd RTB x15 Horizontal abduction alternating Ues RTB 15 ea. Supine alternation flexion RTB x 15 Manual Therapy: Posterior and inferior shoulder glides/mobs 3x10 PNF D1 F/E 30x   TODAY'S TREATMENT  Nustep seat 9, L2 arms 8 x6 min Supine PNF D1 F/E x20 to increase ROM Corner pec stretch and corner abduction stertch  30s x3  L shoulder mobs in posterior and inferior directions to regain AROM in flexion and abduction followed by PROM into IR.ER, F, ABD (Treatment modified due to coughing spells when supine, used wedge to accommodate position)      PATIENT EDUCATION:  Education details: Discussed eval findings, rehab potential and POC Person educated: Patient Education method: Explanation Education comprehension: verbalized understanding     HOME EXERCISE PROGRAM: Access Code: [email or web address] URL: [email or web address] Date: 05/24/2021 Prepared by: Sharlynn Oliphant  Exercises Standing Shoulder Scaption - 2 x daily - 7 x weekly - 1 sets - 30 reps Corner Pec Major Stretch - 2 x daily - 7 x weekly - 1 sets - 3 reps - 30s hold Supine Shoulder Horizontal Abduction with Resistance - 2 x daily - 7 x weekly - 1 sets - 30 reps     ASSESSMENT:   CLINICAL IMPRESSION: Low back essentially painfree, was able to drive for over an hour w/o symptoms.  L shoulder mobility and function continues to improve, pain minimal to none, main limitation is stiffness.  FOTO for low back 83 out of predicted 78   REHAB POTENTIAL: Good   CLINICAL DECISION MAKING: Stable/uncomplicated   EVALUATION COMPLEXITY: Low     GOALS: Goals reviewed with patient? Yes   SHORT TERM GOALS:   STG Name Target Date Goal status  1 Patient to demo independence in HEP Baseline: [email or web address] 04/28/2021 05/12/21  Able to demo HEP to PT, goal met  2 Patient to report 3/10 diffuse   low back discomfort with prolonged sitting/driving Baseline: 4/28 pain with prolonged sitting/driving 76/81/1572 10/06/01 Goal met  3 Patient to reach b/h back with 1/10 pain Baseline: 4/10 intermittent pain when reaching b/h back; 06/03/21 1/10 pain when reaching 04/28/2021 Goal met  LONG TERM GOALS:    LTG Name Target Date Goal status  1 Patient to demo 75% trunk mobility in flexion, extension and B sidebending, AROM Baseline: AROM 50% flexion and B sidebending, 25% extension 05/19/2021 05/22/21 75% AROM in F/E/BSB,  Goal met  2 Patient to increase B LE strength to 4+/5 in hip flexion, extension and abduction, knee flexion and extension and ankle PF/DF Baseline: 4/5 strength throughout 05/19/2021 INITIAL  3 Patient to report 2/10 pain levels following 1 hour driving/sitting Baseline: 4/10 pain following driving sitting; 5/59/74 No pain following 2 one hour drives 1/63/8453 Goal met  4 Improve overall core strength to 4/5 as evidenced by less effort noted with positional changes especially supine/sit transitions Baseline: Moderate effort 05/19/2021 3+/5 core strength  5 Patient to demo moderate restrictions to lumbar spring testing at L5-2 Baseline: severe restrictions to spring testing L5-2 05/19/2021 INITIAL       6  Patient to regain 160d AROM L shoulder flexion, abduction, 70d IR/ER Baseline: 145d L flexion, 120d abduction, 70d IR, 50d ER and 40d _0  abduction 05/19/2021  INITIAL    7  Patient to demo negative impingement signs 05/19/21   Neers/Kennedy/Hawkins neg for pain    PLAN: PT FREQUENCY: 1x/week   PT DURATION: 4 weeks   PLANNED INTERVENTIONS: Therapeutic exercises, Therapeutic activity, Neuro Muscular re-education, Balance training, Gait training, Patient/Family education, and Joint mobilization   PLAN FOR NEXT SESSION: HEP on lumbar symptoms, prone shoulder strengthening and increased resistance as tolerated     Jacqulynn Cadet Ziemba PT 05/24/2021, 3:41 PM

## 2021-05-29 ENCOUNTER — Other Ambulatory Visit: Payer: Self-pay

## 2021-05-29 ENCOUNTER — Ambulatory Visit: Payer: Medicare HMO

## 2021-05-29 DIAGNOSIS — G8929 Other chronic pain: Secondary | ICD-10-CM | POA: Diagnosis not present

## 2021-05-29 DIAGNOSIS — M545 Low back pain, unspecified: Secondary | ICD-10-CM | POA: Diagnosis not present

## 2021-05-29 DIAGNOSIS — M7542 Impingement syndrome of left shoulder: Secondary | ICD-10-CM

## 2021-05-29 DIAGNOSIS — M25512 Pain in left shoulder: Secondary | ICD-10-CM | POA: Diagnosis not present

## 2021-05-29 DIAGNOSIS — M6281 Muscle weakness (generalized): Secondary | ICD-10-CM | POA: Diagnosis not present

## 2021-05-29 NOTE — Therapy (Signed)
OUTPATIENT PHYSICAL THERAPY TREATMENT NOTE   Patient Name: Edwin [name] MRN: [id number] DOB:02-10-1946, 76 y.o., male Today's Date: 05/29/2021  PCP: [name], MD REFERRING PROVIDER: [name], MD   PT End of Session - 05/29/21 1044     Visit Number 7    Number of Visits 10    Date for PT Re-Evaluation 06/30/21    Authorization Type [employer] MCR    Authorization Time Period 04/14/21-06/30/21    Progress Note Due on Visit 10    PT Start Time 1045    PT Stop Time 1130    PT Time Calculation (min) 45 min    Activity Tolerance Patient tolerated treatment well    Behavior During Therapy [facility] for tasks assessed/performed             Past Medical History:  Diagnosis Date   BPH (benign prostatic hypertrophy)    Cancer ([place])    Basal cell of face, [name]   Diverticulosis    Rosanna Randy syndrome    Hyperglycemia    Hyperlipidemia    NMR Lipoprofile 2005; LDL 146 (2007/1330), HDL 38, TG 169.  LDL goal = <100; Framingham Study LDL goal =<130   Hypertension    OSA on CPAP    Tubular adenoma of colon 01/19/08   Past Surgical History:  Procedure Laterality Date   COLONOSCOPY  2014   neg; [facility]   COLONOSCOPY W/ POLYPECTOMY  2004 & 2009   Tics; [facility]   KNEE ARTHROSCOPY     [name], left knee   TONSILLECTOMY     WISDOM TOOTH EXTRACTION     Patient Active Problem List   Diagnosis Date Noted   URI (upper respiratory infection) 05/15/2021   Reactive airway disease with wheezing 08/22/2020   Cough 08/22/2020   Left shoulder pain 05/31/2020   Pes anserine bursitis 05/05/2020   Onychomycosis of right great toe 10/27/2019   Piriformis syndrome of left side 02/05/2019   CAD (coronary artery disease) 02/03/2019   Greater trochanteric bursitis 01/01/2019   Degenerative arthritis of left knee 05/08/2018   Prediabetes 03/01/2018   Muscle cramping 02/27/2018   Subclinical hypothyroidism 02/27/2018   Seasonal allergic rhinitis due to pollen  09/09/2017   [facility] (acromioclavicular) arthritis 08/14/2017   Tear of MCL (medial collateral ligament) of knee, right, initial encounter 06/05/2017   Piriformis syndrome of right side 02/19/2017   Lower back pain 02/15/2017   Acute bronchitis 10/09/2016   Patellofemoral arthritis of right knee 12/30/2014   Morbid obesity ([place])    Nonspecific abnormal electrocardiogram (ECG) (EKG) 08/18/2014   OSA (obstructive sleep apnea) 06/02/2014   Achilles tendinosis 12/26/2012   RBBB 05/20/2012   Hyperlipidemia 03/03/2008   Essential hypertension 03/03/2008   History of colonic polyps 03/03/2008   History of basal cell cancer 05/26/2007   GILBERT'S SYNDROME 05/26/2007   DIVERTICULOSIS, COLON 05/26/2007   BPH (benign prostatic hyperplasia) 05/26/2007    REFERRING DIAG: L shoulder pain, Chronic midline low back pain without sciatica  Muscle weakness (generalized)  Impingement syndrome of left shoulder  Chronic left shoulder pain  THERAPY DIAG:  Chronic midline low back pain without sciatica  Impingement syndrome of left shoulder  Chronic left shoulder pain  PERTINENT HISTORY:   Left shoulder pain - Lyndal Pulley, DO at 02/14/2021 10:20 AM  Status: Written  Related Problem: Left shoulder pain    Patient is having increasing limited range of motion.  Patient states that the pain is starting  to affect daily activities.  Patient does have a mild positive empty can which is new as well.  Discussed with patient again at great length.  I discussed icing regimen and home exercises.  Discussed which activities to do which wants to avoid.  We did discuss that with patient failing all other conservative therapy including formal physical therapy I do feel that MRI, MR arthrogram would be necessary at this time.  Follow-up with me after imaging to discuss further.     Lower back pain - Lyndal Pulley, DO at 02/14/2021 10:22 AM  Status: Written  Related Problem: Lower back pain    Lower back  pain that he has had for years.  Patient's x-rays have shown some mild progression noted especially with the advanced L3-L4 and L4-L5 arthropathy and degenerative disc disease patient has had this pain for 4 years but slowly progressing and not responding to conservative therapy.  Do feel MRI could be beneficial to further evaluate depending on findings we can discuss the possibility of epidurals but we do think that ankle pain avoid any surgical intervention.  If patient has any worsening pain, bowel or bladder incontinence, or with depending on findings we will discuss further management       PRECAUTIONS: None  SUBJECTIVE: Low back pain remains minimal   PAIN:  Are you having pain? Yes VAS scale: 1/10 L shoulder, 0/10 low back Pain location: L shoulder, low back Pain orientation: Left  PAIN TYPE: dull Pain description: intermittent  Aggravating factors: reaching behind Relieving factors: rest    OBJECTIVE: AROM L shoulder in supine 155d flexion, 160 abd in scapular plane, 75d ER and 65d IR(05/12/21)  TODAY'S TREATMENT: [facility] Adult PT Treatment:                                                DATE: 05/29/21 Therapeutic Exercise: UBE 3/3 L2.0 Supine L shoulder flexion 2# x20 Supine L shoulder press and protract 2# x20 Sidelie ER 2# x20 Sidelie abduction 2# x20 Prone shoulder flexion 2# 20x Prone shoulder ext 2# 20x Prone scaption 2# x20 Prone hor abd 2# 10/10 Horizontal abd RTB x15 Horizontal abduction alternating Ues RTB 15 ea. HEP update Manual Therapy: Posterior and inferior shoulder glides/mobs 3x10 PNF D1 F/E 30x  [facility] Adult PT Treatment:                                                DATE: 05/24/21 Therapeutic Exercise: UBE 3/3 L2.0 Supine L shoulder flexion 2# x15 Supine L shoulder press and protract 2# x15 Sidelie ER 2# x15 Sidelie abduction 2# x15 Prone shoulder flexion 2# 15x Prone shoulder ext 2# 15x Prone scaption 2# x15 Prone hor abd 2# 10/10 Horizontal  abd RTB x15 Horizontal abduction alternating Ues RTB 15 ea. Supine alternation flexion RTB x 15 Manual Therapy: Posterior and inferior shoulder glides/mobs 3x10 PNF D1 F/E 30x    [facility] Adult PT Treatment:                                                DATE: 05/22/21  Therapeutic Exercise: UBE 3/3 L1.5 Supine L shoulder flexion 1# x30 Supine L shoulder press and protract 1# x30 Sidelie ER 1# x30 Sidelie abduction 1# x30 Horizontal abd RTB x15 Horizontal abduction alternating Ues RTB 15 ea. Supine alternation flexion RTB x 15 Manual Therapy: Posterior and inferior shoulder glides/mobs 3x10 PNF D1 F/E 30x   TODAY'S TREATMENT  Nustep seat 9, L2 arms 8 x6 min Supine PNF D1 F/E x20 to increase ROM Corner pec stretch and corner abduction stertch 30s x3  L shoulder mobs in posterior and inferior directions to regain AROM in flexion and abduction followed by PROM into IR.ER, F, ABD (Treatment modified due to coughing spells when supine, used wedge to accommodate position)      PATIENT EDUCATION:  Education details: Discussed eval findings, rehab potential and POC Person educated: Patient Education method: Explanation Education comprehension: verbalized understanding     HOME EXERCISE PROGRAM: Access Code: [email or web address] URL: [email or web address] Date: 05/29/2021 Prepared by: Sharlynn Oliphant  Exercises Doorway Pec Stretch at 120 Degrees Abduction - 2 x daily - 7 x weekly - 1 sets - 3 reps - 30s hold Standing Shoulder Abduction Finger Walk at Wall - 2 x daily - 7 x weekly - 1 sets - 3 reps - 30s hold     ASSESSMENT:   CLINICAL IMPRESSION: Cannot recall any painful activities for lumbar and L shoulder, low back has not bothered him but he has yet to drive long distances.  L shoulder only feels stiff reaching OH and behind back. Still lacks end range ER and flexion, HEP updated to address.    REHAB POTENTIAL: Good   CLINICAL DECISION MAKING: Stable/uncomplicated    EVALUATION COMPLEXITY: Low     GOALS: Goals reviewed with patient? Yes   SHORT TERM GOALS:   STG Name Target Date Goal status  1 Patient to demo independence in HEP Baseline: [email or web address] 04/28/2021 05/12/21  Able to demo HEP to PT, goal met  2 Patient to report 3/10 diffuse  low back discomfort with prolonged sitting/driving Baseline: 8/12 pain with prolonged sitting/driving 75/17/0017 08/14/42 Goal met  3 Patient to reach b/h back with 1/10 pain Baseline: 4/10 intermittent pain when reaching b/h back; 06/03/21 1/10 pain when reaching 04/28/2021 Goal met  LONG TERM GOALS:    LTG Name Target Date Goal status  1 Patient to demo 75% trunk mobility in flexion, extension and B sidebending, AROM Baseline: AROM 50% flexion and B sidebending, 25% extension 05/19/2021 05/22/21 75% AROM in F/E/BSB,  Goal met  2 Patient to increase B LE strength to 4+/5 in hip flexion, extension and abduction, knee flexion and extension and ankle PF/DF Baseline: 4/5 strength throughout 05/19/2021 4+/5 strength in hip flexion, extension and abduction, knee flexion and extension and ankle PF/DF; Goal met  3 Patient to report 2/10 pain levels following 1 hour driving/sitting Baseline: 4/10 pain following driving sitting; 9/67/59 No pain following 2 one hour drives 1/63/8466 Goal met  4 Improve overall core strength to 4/5 as evidenced by less effort noted with positional changes especially supine/sit transitions Baseline: Moderate effort 05/19/2021 3+/5 core strength  5 Patient to demo moderate restrictions to lumbar spring testing at L5-2 Baseline: severe restrictions to spring testing L5-2 05/19/2021 INITIAL       6  Patient to regain 160d AROM L shoulder flexion, abduction, 70d IR/ER Baseline: 145d L flexion, 120d abduction, 70d IR, 50d ER and 40d _0  abduction 05/19/2021  INITIAL    7  Patient to demo negative impingement signs  05/19/21   Neers/Kennedy/Hawkins neg for pain, goal met    PLAN: PT FREQUENCY: 1x/week    PT DURATION: 4 weeks   PLANNED INTERVENTIONS: Therapeutic exercises, Therapeutic activity, Neuro Muscular re-education, Balance training, Gait training, Patient/Family education, and Joint mobilization   PLAN FOR NEXT SESSION: f/u on HEP, low back pain, L shoulder ROM and DC if appropriate     Lanice Shirts PT 05/29/2021, 11:15 AM

## 2021-05-31 ENCOUNTER — Ambulatory Visit: Payer: Medicare HMO

## 2021-06-05 ENCOUNTER — Ambulatory Visit: Payer: Medicare HMO

## 2021-06-05 ENCOUNTER — Other Ambulatory Visit: Payer: Self-pay

## 2021-06-05 DIAGNOSIS — M7542 Impingement syndrome of left shoulder: Secondary | ICD-10-CM

## 2021-06-05 DIAGNOSIS — M545 Low back pain, unspecified: Secondary | ICD-10-CM | POA: Diagnosis not present

## 2021-06-05 DIAGNOSIS — M6281 Muscle weakness (generalized): Secondary | ICD-10-CM

## 2021-06-05 DIAGNOSIS — M25512 Pain in left shoulder: Secondary | ICD-10-CM | POA: Diagnosis not present

## 2021-06-05 DIAGNOSIS — G8929 Other chronic pain: Secondary | ICD-10-CM

## 2021-06-05 NOTE — Therapy (Signed)
OUTPATIENT PHYSICAL THERAPY TREATMENT NOTE/DC Summary   Patient Name: Edwin [name] MRN: [id number] DOB:1946/03/11, 76 y.o., male Today's Date: 06/05/2021  PCP: [name], MD REFERRING PROVIDER: [name], MD  PHYSICAL THERAPY DISCHARGE SUMMARY  Visits from Start of Care: 8  Current functional level related to goals / functional outcomes: All goals met   Remaining deficits: Minimal ROM deficits   Education / Equipment: HEP   Patient agrees to discharge. Patient goals were met. Patient is being discharged due to being pleased with the current functional level.    PT End of Session - 06/05/21 1605     Visit Number 8    Number of Visits 10    Date for PT Re-Evaluation 06/30/21    Authorization Type [employer] MCR    Authorization Time Period 04/14/21-06/30/21    Progress Note Due on Visit 10    PT Start Time 1605    PT Stop Time 1650    PT Time Calculation (min) 45 min    Activity Tolerance Patient tolerated treatment well    Behavior During Therapy WFL for tasks assessed/performed             Past Medical History:  Diagnosis Date   BPH (benign prostatic hypertrophy)    Cancer ([place])    Basal cell of face, [name]   Diverticulosis    Rosanna Randy syndrome    Hyperglycemia    Hyperlipidemia    NMR Lipoprofile 2005; LDL 146 (2007/1330), HDL 38, TG 169.  LDL goal = <100; Framingham Study LDL goal =<130   Hypertension    OSA on CPAP    Tubular adenoma of colon 01/19/08   Past Surgical History:  Procedure Laterality Date   COLONOSCOPY  2014   neg;  [facility]   COLONOSCOPY W/ POLYPECTOMY  2004 & 2009   Tics; [facility]   KNEE ARTHROSCOPY     [name], left knee   TONSILLECTOMY     WISDOM TOOTH EXTRACTION     Patient Active Problem List   Diagnosis Date Noted   URI (upper respiratory infection) 05/15/2021   Reactive airway disease with wheezing 08/22/2020   Cough 08/22/2020   Left shoulder pain 05/31/2020   Pes anserine bursitis  05/05/2020   Onychomycosis of right great toe 10/27/2019   Piriformis syndrome of left side 02/05/2019   CAD (coronary artery disease) 02/03/2019   Greater trochanteric bursitis 01/01/2019   Degenerative arthritis of left knee 05/08/2018   Prediabetes 03/01/2018   Muscle cramping 02/27/2018   Subclinical hypothyroidism 02/27/2018   Seasonal allergic rhinitis due to pollen 09/09/2017   [facility] (acromioclavicular) arthritis 08/14/2017   Tear of MCL (medial collateral ligament) of knee, right, initial encounter 06/05/2017   Piriformis syndrome of right side 02/19/2017   Lower back pain 02/15/2017   Acute bronchitis 10/09/2016   Patellofemoral arthritis of right knee 12/30/2014   Morbid obesity ([place])    Nonspecific abnormal electrocardiogram (ECG) (EKG) 08/18/2014   OSA (obstructive sleep apnea) 06/02/2014   Achilles tendinosis 12/26/2012   RBBB 05/20/2012   Hyperlipidemia 03/03/2008   Essential hypertension 03/03/2008   History of colonic polyps 03/03/2008   History of basal cell cancer 05/26/2007   GILBERT'S SYNDROME 05/26/2007   DIVERTICULOSIS, COLON 05/26/2007   BPH (benign prostatic hyperplasia) 05/26/2007    REFERRING DIAG: L shoulder pain, Chronic midline low back pain without sciatica  Muscle weakness (generalized)  Impingement syndrome of left shoulder  Chronic left shoulder pain  THERAPY DIAG:  Impingement  syndrome of left shoulder  Chronic left shoulder pain  Muscle weakness (generalized)  PERTINENT HISTORY:   Left shoulder pain - Lyndal Pulley, DO at 02/14/2021 10:20 AM  Status: Written  Related Problem: Left shoulder pain    Patient is having increasing limited range of motion.  Patient states that the pain is starting to affect daily activities.  Patient does have a mild positive empty can which is new as well.  Discussed with patient again at great length.  I discussed icing regimen and home exercises.  Discussed which activities to do which wants to  avoid.  We did discuss that with patient failing all other conservative therapy including formal physical therapy I do feel that MRI, MR arthrogram would be necessary at this time.  Follow-up with me after imaging to discuss further.     Lower back pain - Lyndal Pulley, DO at 02/14/2021 10:22 AM  Status: Written  Related Problem: Lower back pain    Lower back pain that he has had for years.  Patient's x-rays have shown some mild progression noted especially with the advanced L3-L4 and L4-L5 arthropathy and degenerative disc disease patient has had this pain for 4 years but slowly progressing and not responding to conservative therapy.  Do feel MRI could be beneficial to further evaluate depending on findings we can discuss the possibility of epidurals but we do think that ankle pain avoid any surgical intervention.  If patient has any worsening pain, bowel or bladder incontinence, or with depending on findings we will discuss further management       PRECAUTIONS: None  SUBJECTIVE: Low back pain remains minimal to none.  Denies L shoulder pain and feels he can perform all tasks w/o pain or restriction  PAIN:  Are you having pain? no VAS scale: 1/10 L shoulder, 0/10 low back Pain location: L shoulder, low back Pain orientation: Left  PAIN TYPE: dull Pain description: intermittent  Aggravating factors: reaching behind Relieving factors: rest    OBJECTIVE: AROM L shoulder in supine 160d flexion, 160d abd in scapular plane, 75d ER and 70d IR(06/05/21)  TODAY'S TREATMENT:  [facility] Adult PT Treatment:                                                DATE: 06/05/21 Therapeutic Exercise: UBE 3/3 L2.0 Supine L shoulder flexion 3# x15 Supine L shoulder press and protract 3# x15 Sidelie ER 3# x15 Sidelie abduction 3# x15 Prone shoulder flexion 3# 15x Prone shoulder ext 3# 15x Prone scaption 3# x15 Prone hor abd 3# 10/10 Horizontal abd BTB x15 Horizontal abduction alternating UEs BTB 15  ea. Supine OH flexion altrenating BTB 15x HEP update Manual Therapy: Posterior and inferior GH glides, 3x10 ea. D1 F/E 30x with OP   [facility] Adult PT Treatment:                                                DATE: 05/29/21 Therapeutic Exercise: UBE 3/3 L2.0 Supine L shoulder flexion 2# x20 Supine L shoulder press and protract 2# x20 Sidelie ER 2# x20 Sidelie abduction 2# x20 Prone shoulder flexion 2# 20x Prone shoulder ext 2# 20x Prone scaption 2# x20 Prone hor abd 2# 10/10  Horizontal abd RTB x15 Horizontal abduction alternating Ues RTB 15 ea. HEP update Manual Therapy: Posterior and inferior shoulder glides/mobs 3x10 PNF D1 F/E 30x  [facility] Adult PT Treatment:                                                DATE: 05/24/21 Therapeutic Exercise: UBE 3/3 L2.0 Supine L shoulder flexion 2# x15 Supine L shoulder press and protract 2# x15 Sidelie ER 2# x15 Sidelie abduction 2# x15 Prone shoulder flexion 2# 15x Prone shoulder ext 2# 15x Prone scaption 2# x15 Prone hor abd 2# 10/10 Horizontal abd RTB x15 Horizontal abduction alternating Ues RTB 15 ea. Supine alternation flexion RTB x 15 Manual Therapy: Posterior and inferior shoulder glides/mobs 3x10 PNF D1 F/E 30x    [facility] Adult PT Treatment:                                                DATE: 05/22/21 Therapeutic Exercise: UBE 3/3 L1.5 Supine L shoulder flexion 1# x30 Supine L shoulder press and protract 1# x30 Sidelie ER 1# x30 Sidelie abduction 1# x30 Horizontal abd RTB x15 Horizontal abduction alternating Ues RTB 15 ea. Supine alternation flexion RTB x 15 Manual Therapy: Posterior and inferior shoulder glides/mobs 3x10 PNF D1 F/E 30x   TODAY'S TREATMENT  Nustep seat 9, L2 arms 8 x6 min Supine PNF D1 F/E x20 to increase ROM Corner pec stretch and corner abduction stertch 30s x3  L shoulder mobs in posterior and inferior directions to regain AROM in flexion and abduction followed by PROM into IR.ER, F,  ABD (Treatment modified due to coughing spells when supine, used wedge to accommodate position)      PATIENT EDUCATION:  Education details: Discussed eval findings, rehab potential and POC Person educated: Patient Education method: Explanation Education comprehension: verbalized understanding     HOME EXERCISE PROGRAM: Access Code: [email or web address] URL: [email or web address] Date: 05/29/2021 Prepared by: Sharlynn Oliphant  Exercises Doorway Pec Stretch at 120 Degrees Abduction - 2 x daily - 7 x weekly - 1 sets - 3 reps - 30s hold Standing Shoulder Abduction Finger Walk at Wall - 2 x daily - 7 x weekly - 1 sets - 3 reps - 30s hold     ASSESSMENT:   CLINICAL IMPRESSION: Rehab goals met, patient has returned to a functional level w/o pain  REHAB POTENTIAL: Good   CLINICAL DECISION MAKING: Stable/uncomplicated   EVALUATION COMPLEXITY: Low     GOALS: Goals reviewed with patient? Yes   SHORT TERM GOALS:   STG Name Target Date Goal status  1 Patient to demo independence in HEP Baseline: [email or web address] 04/28/2021 05/12/21  Able to demo HEP to PT, goal met  2 Patient to report 3/10 diffuse  low back discomfort with prolonged sitting/driving Baseline: 9/37 pain with prolonged sitting/driving 34/28/7681 05/11/70 Goal met  3 Patient to reach b/h back with 1/10 pain Baseline: 4/10 intermittent pain when reaching b/h back; 06/03/21 1/10 pain when reaching 04/28/2021 Goal met  LONG TERM GOALS:    LTG Name Target Date Goal status  1 Patient to demo 75% trunk mobility in flexion, extension and B sidebending, AROM Baseline: AROM 50% flexion and B sidebending, 25% extension 05/19/2021 05/22/21 75% AROM in  F/E/BSB,  Goal met  2 Patient to increase B LE strength to 4+/5 in hip flexion, extension and abduction, knee flexion and extension and ankle PF/DF Baseline: 4/5 strength throughout 05/19/2021 4+/5 strength in hip flexion, extension and abduction, knee flexion and extension and ankle  PF/DF; Goal met  3 Patient to report 2/10 pain levels following 1 hour driving/sitting Baseline: 4/10 pain following driving sitting; 5/33/17 No pain following 2 one hour drives 08/13/9276 Goal met  4 Improve overall core strength to 4/5 as evidenced by less effort noted with positional changes especially supine/sit transitions Baseline: Moderate effort 05/19/2021 Minimal effort, goals met  5 Patient to demo moderate restrictions to lumbar spring testing at L5-2 Baseline: severe restrictions to spring testing L5-2 05/19/2021 Moderate restrictions, goal met       6  Patient to regain 160d AROM L shoulder flexion, abduction, 70d IR/ER Baseline: 145d L flexion, 120d abduction, 70d IR, 50d ER and 40d @90d  abduction 05/19/2021  Goal met   7  Patient to demo negative impingement signs 05/19/21   Neers/Kennedy/Hawkins neg for pain, goal met    PLAN: PT FREQUENCY: 1x/week   PT DURATION: 4 weeks   PLANNED INTERVENTIONS: Therapeutic exercises, Therapeutic activity, Neuro Muscular re-education, Balance training, Gait training, Patient/Family education, and Joint mobilization   PLAN FOR NEXT SESSION: DC PT     Lanice Shirts PT 06/05/2021, 4:10 PM

## 2021-06-13 ENCOUNTER — Ambulatory Visit: Payer: Medicare HMO

## 2021-06-19 DIAGNOSIS — H52202 Unspecified astigmatism, left eye: Secondary | ICD-10-CM | POA: Diagnosis not present

## 2021-06-19 DIAGNOSIS — H2512 Age-related nuclear cataract, left eye: Secondary | ICD-10-CM | POA: Diagnosis not present

## 2021-06-20 DIAGNOSIS — H2511 Age-related nuclear cataract, right eye: Secondary | ICD-10-CM | POA: Diagnosis not present

## 2021-06-21 ENCOUNTER — Other Ambulatory Visit: Payer: Self-pay | Admitting: Cardiovascular Disease

## 2021-06-21 DIAGNOSIS — E7849 Other hyperlipidemia: Secondary | ICD-10-CM

## 2021-06-21 DIAGNOSIS — R931 Abnormal findings on diagnostic imaging of heart and coronary circulation: Secondary | ICD-10-CM

## 2021-06-27 ENCOUNTER — Encounter: Payer: Self-pay | Admitting: Internal Medicine

## 2021-06-27 MED ORDER — DOXYCYCLINE HYCLATE 100 MG PO TABS: 100.0000 mg | ORAL_TABLET | Freq: Two times a day (BID) | ORAL | 0 refills | Status: AC

## 2021-06-29 ENCOUNTER — Ambulatory Visit: Payer: Medicare HMO

## 2021-07-03 DIAGNOSIS — H52201 Unspecified astigmatism, right eye: Secondary | ICD-10-CM | POA: Diagnosis not present

## 2021-07-03 DIAGNOSIS — H2511 Age-related nuclear cataract, right eye: Secondary | ICD-10-CM | POA: Diagnosis not present

## 2021-08-02 ENCOUNTER — Ambulatory Visit (INDEPENDENT_AMBULATORY_CARE_PROVIDER_SITE_OTHER): Payer: Medicare HMO | Admitting: Internal Medicine

## 2021-08-02 ENCOUNTER — Encounter: Payer: Self-pay | Admitting: Internal Medicine

## 2021-08-02 DIAGNOSIS — J069 Acute upper respiratory infection, unspecified: Secondary | ICD-10-CM

## 2021-08-02 DIAGNOSIS — D692 Other nonthrombocytopenic purpura: Secondary | ICD-10-CM | POA: Diagnosis not present

## 2021-08-02 MED ORDER — DOXYCYCLINE HYCLATE 100 MG PO TABS: 100.0000 mg | ORAL_TABLET | Freq: Two times a day (BID) | ORAL | 0 refills | Status: AC

## 2021-08-02 NOTE — Assessment & Plan Note (Signed)
Acute ?Concern for bacterial cause especially given wife's disease ( bronchitis, hypoxia) ?Start doxycycline 100 mg bid x 10 days ?Continue otc meds ? ?

## 2021-08-02 NOTE — Assessment & Plan Note (Signed)
Mild bruising left forearm ?reassured ?Likely related to nsaids ? ?

## 2021-08-02 NOTE — Progress Notes (Signed)
? ? ?Subjective:  ? ? Patient ID: Edwin [name], male    DOB: 1946/02/10, 76 y.o.   MRN: [id number] ? ?This visit occurred during the SARS-CoV-2 public health emergency.  Safety protocols were in place, including screening questions prior to the visit, additional usage of staff PPE, and extensive cleaning of exam room while observing appropriate contact time as indicated for disinfecting solutions. ? ? ? ?HPI ?[name] is here for  ?Chief Complaint  ?Patient presents with  ? Cough  ?  Cough, post nasal drip  ? ? ? ?His symptoms started two days ago.   ? ?He is experiencing nasal congestion, PND, sneezing, SOR, cough, eye watering, headaches.  No fever or SOB.   ? ?He has tried taking  xyzal and mucinex and tylenol ? ?His wife just got out of the hospital with bronchitis, hypoxia ? ? ? ? ? ?Medications and allergies reviewed with patient and updated if appropriate. ? ?Current Outpatient Medications on File Prior to Visit  ?Medication Sig Dispense Refill  ? albuterol (VENTOLIN HFA) 108 (90 Base) MCG/ACT inhaler Inhale 2 puffs into the lungs every 6 (six) hours as needed (wheezing or cough). 8 g 0  ? aspirin 81 MG tablet Take 81 mg by mouth daily.    ? atorvastatin (LIPITOR) 10 MG tablet TAKE 1 TABLET EVERY OTHER DAY 45 tablet 3  ? azelastine (ASTELIN) 0.1 % nasal spray Place into the nose.    ? chlorpheniramine-HYDROcodone (TUSSIONEX PENNKINETIC ER) 10-8 MG/5ML SUER Take 5 mLs by mouth every 12 (twelve) hours as needed for cough. 100 mL 0  ? Cholecalciferol (VITAMIN D3) 1000 UNITS CAPS Take 1 capsule by mouth daily.    ? Cyanocobalamin (VITAMIN B-12 PO) Take by mouth as directed.    ? metoprolol succinate (TOPROL-XL) 50 MG 24 hr tablet TAKE 1 TABLET ONCE DAILY. TAKE WITH OR IMMEDIATELY FOLLOWING A MEAL. 90 tablet 3  ? Multiple Vitamin (MULTIVITAMIN) tablet Take 1 tablet by mouth daily.    ? naproxen sodium (ALEVE) 220 MG tablet Take 220 mg by mouth daily as needed.    ? nitroGLYCERIN (NITROSTAT) 0.4 MG SL tablet Place  1 tablet (0.4 mg total) under the tongue every 5 (five) minutes as needed for chest pain. 25 tablet 3  ? Turmeric (QC TUMERIC COMPLEX PO) Take by mouth daily.    ? ?No current facility-administered medications on file prior to visit.  ? ? ?Review of Systems  ?Constitutional:  Negative for fever.  ?HENT:  Positive for congestion, postnasal drip, sneezing and sore throat (mild - not today). Negative for ear pain, sinus pressure and sinus pain.   ?Eyes:  Positive for discharge (watery).  ?Respiratory:  Positive for cough. Negative for chest tightness, shortness of breath and wheezing.   ?Neurological:  Positive for headaches. Negative for dizziness and light-headedness.  ? ?   ?Objective:  ? ?Vitals:  ? 08/02/21 1336  ?BP: (!) 142/72  ?Pulse: 80  ?Temp: 98.4 ?F (36.9 ?C)  ?SpO2: 97%  ? ?BP Readings from Last 3 Encounters:  ?08/02/21 (!) 142/72  ?05/15/21 (!) 146/78  ?04/12/21 140/74  ? ?Wt Readings from Last 3 Encounters:  ?08/02/21 256 lb (116.1 kg)  ?05/15/21 250 lb 9.6 oz (113.7 kg)  ?04/12/21 257 lb 6.4 oz (116.8 kg)  ? ?Body mass index is 37.8 kg/m?. ? ?  ?Physical Exam ?Constitutional:   ?   General: He is not in acute distress. ?   Appearance: Normal appearance. He is not ill-appearing.  ?  HENT:  ?   Head: Normocephalic.  ?   Right Ear: Tympanic membrane, ear canal and external ear normal. There is no impacted cerumen.  ?   Left Ear: Tympanic membrane, ear canal and external ear normal. There is no impacted cerumen.  ?   Nose: Congestion present.  ?   Mouth/Throat:  ?   Mouth: Mucous membranes are moist.  ?   Pharynx: No oropharyngeal exudate or posterior oropharyngeal erythema.  ?Eyes:  ?   Conjunctiva/sclera: Conjunctivae normal.  ?Cardiovascular:  ?   Rate and Rhythm: Normal rate and regular rhythm.  ?Pulmonary:  ?   Effort: Pulmonary effort is normal. No respiratory distress.  ?   Breath sounds: Normal breath sounds. No wheezing or rales.  ?Musculoskeletal:  ?   Cervical back: Neck supple. No tenderness.   ?Lymphadenopathy:  ?   Cervical: No cervical adenopathy.  ?Skin: ?   General: Skin is warm and dry.  ?   Findings: No rash.  ?Neurological:  ?   Mental Status: He is alert.  ? ?   ? ? ? ? ? ?Assessment & Plan:  ? ? ?See Problem List for Assessment and Plan of chronic medical problems.  ? ? ? ? ?

## 2021-08-02 NOTE — Patient Instructions (Addendum)
? ? ? ?  Medications changes include :   omnicef 300 mg twice dialy ? ? ?Your prescription(s) have been sent to your pharmacy.  ? ? ? ?No follow-ups on file. ? ?

## 2021-09-06 DIAGNOSIS — Z0100 Encounter for examination of eyes and vision without abnormal findings: Secondary | ICD-10-CM | POA: Diagnosis not present

## 2021-09-18 ENCOUNTER — Ambulatory Visit (INDEPENDENT_AMBULATORY_CARE_PROVIDER_SITE_OTHER): Payer: Medicare HMO

## 2021-09-18 ENCOUNTER — Ambulatory Visit (INDEPENDENT_AMBULATORY_CARE_PROVIDER_SITE_OTHER): Payer: Medicare HMO | Admitting: Sports Medicine

## 2021-09-18 VITALS — BP 138/78 | HR 71 | Ht 69.0 in | Wt 260.0 lb

## 2021-09-18 DIAGNOSIS — M25562 Pain in left knee: Secondary | ICD-10-CM

## 2021-09-18 DIAGNOSIS — M1712 Unilateral primary osteoarthritis, left knee: Secondary | ICD-10-CM

## 2021-09-18 DIAGNOSIS — G8929 Other chronic pain: Secondary | ICD-10-CM | POA: Diagnosis not present

## 2021-09-18 NOTE — Patient Instructions (Addendum)
Good to see you  ?Recommend taking aleve 2x a day while on your trip and stopping after  ?As needed follow up  ? ?

## 2021-09-18 NOTE — Progress Notes (Signed)
? ? Edwin [name] Edwin [name] ?[facility] ?[place] ?Phone: [phone] ?  ?Assessment and Plan:   ?  ?1. Chronic pain of left knee ?2. Primary osteoarthritis of left knee ?-Chronic with exacerbation, initial sports medicine visit ?- Intermittent pain of left knee with acute flare of the past several weeks and patient planning a trip later this week to [place] ?- X-ray obtained in clinic.  My interpretation: No acute fracture or dislocation.  Moderate degenerative changes in medial compartment with decreased space and cortical irregularities and mild changes in patellofemoral compartment ?- Patient had received CSI and HA injections to right knee in 06/2020 and 07/2020 respectively.  He had minimal to no improvement with CSI and significant improvement with HA, so patient elects for HA injection to left knee today.  Tolerated well per note below ?- Patient is traveling to [place] later this week.  Advised to take Aleve twice daily during trip to prevent acute flares, discontinue daily use afterwards ? ?Procedure: Knee Joint Injection ?Side: Left ?Indication: Acute flare of chronic knee pain ? ?Risks explained and consent was given verbally. The site was cleaned with alcohol prep. A needle was introduced with an anterio-lateral approach. Injection given using 9m of Monovisc 22 mg/mL. This was well tolerated and resulted in symptomatic relief.  Needle was removed, hemostasis achieved, and post injection instructions were explained.   Pt was advised to call or return to clinic if these symptoms worsen or fail to improve as anticipated.  ?  ?Pertinent previous records reviewed include none ?  ?Follow Up: As needed if no improvement or worsening of symptoms ?  ?Subjective:   ?I, MPincus Badder am serving as a sEducation administratorfor Doctor [employer]? ?Chief Complaint: left knee pain  ? ?HPI:  ? ?09/18/21 ?Patient is a 76year old male complaining of left knee pain  . Patient states that if he goes out walking it gives him a lot of pain the next day, wants [place] having [place] he had a gel injection and had really good results , he will have flares when he is walking up stairs and it will be very tender , no numbness or tingling,no locking clicking or popping, has not been taking meds for his knee will take an aleve for the pain when it does bother him  ? ?Relevant Historical Information: Chronic left knee pain.  Chronic right knee pain.  Hypertension ? ?Additional pertinent review of systems negative. ? ? ?Current Outpatient Medications:  ?  albuterol (VENTOLIN HFA) 108 (90 Base) MCG/ACT inhaler, Inhale 2 puffs into the lungs every 6 (six) hours as needed (wheezing or cough)., Disp: 8 g, Rfl: 0 ?  aspirin 81 MG tablet, Take 81 mg by mouth daily., Disp: , Rfl:  ?  atorvastatin (LIPITOR) 10 MG tablet, TAKE 1 TABLET EVERY OTHER DAY, Disp: 45 tablet, Rfl: 3 ?  chlorpheniramine-HYDROcodone (TUSSIONEX PENNKINETIC ER) 10-8 MG/5ML SUER, Take 5 mLs by mouth every 12 (twelve) hours as needed for cough., Disp: 100 mL, Rfl: 0 ?  Cholecalciferol (VITAMIN D3) 1000 UNITS CAPS, Take 1 capsule by mouth daily., Disp: , Rfl:  ?  Cyanocobalamin (VITAMIN B-12 PO), Take by mouth as directed., Disp: , Rfl:  ?  metoprolol succinate (TOPROL-XL) 50 MG 24 hr tablet, TAKE 1 TABLET ONCE DAILY. TAKE WITH OR IMMEDIATELY FOLLOWING A MEAL., Disp: 90 tablet, Rfl: 3 ?  Multiple Vitamin (MULTIVITAMIN) tablet, Take 1  tablet by mouth daily., Disp: , Rfl:  ?  naproxen sodium (ALEVE) 220 MG tablet, Take 220 mg by mouth daily as needed., Disp: , Rfl:  ?  nitroGLYCERIN (NITROSTAT) 0.4 MG SL tablet, Place 1 tablet (0.4 mg total) under the tongue every 5 (five) minutes as needed for chest pain., Disp: 25 tablet, Rfl: 3 ?  Turmeric (QC TUMERIC COMPLEX PO), Take by mouth daily., Disp: , Rfl:  ?  azelastine (ASTELIN) 0.1 % nasal spray, Place into the nose., Disp: , Rfl:    ? ?Objective:   ?  ?Vitals:  ? 09/18/21 1522  ?BP: 138/78  ?Pulse: 71  ?SpO2: 97%  ?Weight: 260 lb (117.9 kg)  ?Height: '5\' 9"'$  (1.753 m)  ?  ?  ?Body mass index is 38.4 kg/m?.  ?  ?Physical Exam:   ? ?General:  awake, alert oriented, no acute distress nontoxic ?Skin: no suspicious lesions or rashes ?Neuro:sensation intact, no deficits, strength 5/5 with no deficits, no atrophy, normal muscle tone ?Psych: No signs of anxiety, depression or other mood disorder ? ?Left knee: ?No swelling ?No deformity ?Neg fluid wave, joint milking ?ROM Flex 100, Ext 5 ?NTTP over the quad tendon, medial fem condyle, lat fem condyle, patella, plica, patella tendon, tibial tuberostiy, fibular head, posterior fossa, pes anserine bursa, gerdy's tubercle, medial jt line, lateral jt line ?Neg anterior and posterior drawer ?Neg lachman ?Neg sag sign ?Negative varus stress ?Negative valgus stress ?Negative McMurray ?Negative Thessaly ? ?Gait normal  ? ? ?Electronically signed by:  ?Edwin [name] Edwin [name] ?[facility] ?3:52 PM 09/18/21 ?

## 2021-09-19 ENCOUNTER — Telehealth: Payer: Self-pay

## 2021-09-19 MED ORDER — MOLNUPIRAVIR 200 MG PO CAPS: 4.0000 | ORAL_CAPSULE | Freq: Two times a day (BID) | ORAL | 0 refills | Status: AC

## 2021-09-19 NOTE — Telephone Encounter (Signed)
Pt is calling requesting 2 Rx for himself and his wife Edwin [name] for Paxlovid for traveling to [place] by tomorrow 5/17. Pt states that the tour guide states its a recommendation. ? ?Pharmacy: ?[facility], [place]

## 2021-09-19 NOTE — Telephone Encounter (Signed)
sent 

## 2021-09-19 NOTE — Telephone Encounter (Signed)
1 requirement for the Paxlovid is you have to have your kidney function tested within the last 2 months prior to taking the medication.  Edwin [name] has had that done and so she can have the paxlovid, but she would have to hold her statin while she takes it. ? ?Edwin [name] has not had blood work done in the last few months so I cannot prescribe the Paxlovid, but it could prescribe molnupiravir the other antiviral.  He would not have to hold any medications while taking this.  Let me know. ? ? ?

## 2021-10-10 ENCOUNTER — Encounter: Payer: Self-pay | Admitting: Internal Medicine

## 2021-10-10 NOTE — Patient Instructions (Addendum)
     Blood work was ordered.     Medications changes include :      Your prescription(s) have been sent to your pharmacy.    A referral was ordered for XX.     Someone from that office will call you to schedule an appointment.    Return in about 6 months (around 04/12/2022) for follow up.  

## 2021-10-10 NOTE — Progress Notes (Unsigned)
Subjective:    Patient ID: Edwin [name], male    DOB: 05-Jul-1945, 76 y.o.   MRN: [id number]     HPI Wilber is here for follow up of his chronic medical problems, including CAD, htn, hypothyroidism, hld, prediabetes  He is exercising regularly.   He has no concerns.     Medications and allergies reviewed with patient and updated if appropriate.  Current Outpatient Medications on File Prior to Visit  Medication Sig Dispense Refill   albuterol (VENTOLIN HFA) 108 (90 Base) MCG/ACT inhaler Inhale 2 puffs into the lungs every 6 (six) hours as needed (wheezing or cough). 8 g 0   aspirin 81 MG tablet Take 81 mg by mouth daily.     atorvastatin (LIPITOR) 10 MG tablet TAKE 1 TABLET EVERY OTHER DAY 45 tablet 3   Cholecalciferol (VITAMIN D3) 1000 UNITS CAPS Take 1 capsule by mouth daily.     Cyanocobalamin (VITAMIN B-12 PO) Take by mouth as directed.     metoprolol succinate (TOPROL-XL) 50 MG 24 hr tablet TAKE 1 TABLET ONCE DAILY. TAKE WITH OR IMMEDIATELY FOLLOWING A MEAL. 90 tablet 3   Multiple Vitamin (MULTIVITAMIN) tablet Take 1 tablet by mouth daily.     naproxen sodium (ALEVE) 220 MG tablet Take 220 mg by mouth daily as needed.     nitroGLYCERIN (NITROSTAT) 0.4 MG SL tablet Place 1 tablet (0.4 mg total) under the tongue every 5 (five) minutes as needed for chest pain. 25 tablet 3   Turmeric (QC TUMERIC COMPLEX PO) Take by mouth daily.     azelastine (ASTELIN) 0.1 % nasal spray Place into the nose.     No current facility-administered medications on file prior to visit.     Review of Systems  Constitutional:  Negative for fever.  Respiratory:  Negative for cough, shortness of breath and wheezing.   Cardiovascular:  Negative for chest pain, palpitations and leg swelling.  Neurological:  Negative for light-headedness and headaches.      Objective:   Vitals:   10/11/21 1036  BP: 130/78  Pulse: 86  Temp: 98.1 F (36.7 C)  SpO2: 97%   BP Readings from Last 3  Encounters:  10/11/21 130/78  09/18/21 138/78  08/02/21 (!) 142/72   Wt Readings from Last 3 Encounters:  10/11/21 255 lb (115.7 kg)  09/18/21 260 lb (117.9 kg)  08/02/21 256 lb (116.1 kg)   Body mass index is 37.66 kg/m.    Physical Exam Constitutional:      General: He is not in acute distress.    Appearance: Normal appearance. He is not ill-appearing.  HENT:     Head: Normocephalic and atraumatic.  Eyes:     Conjunctiva/sclera: Conjunctivae normal.  Cardiovascular:     Rate and Rhythm: Normal rate and regular rhythm.     Heart sounds: Normal heart sounds. No murmur heard. Pulmonary:     Effort: Pulmonary effort is normal. No respiratory distress.     Breath sounds: Normal breath sounds. No wheezing or rales.  Musculoskeletal:     Right lower leg: No edema.     Left lower leg: No edema.  Skin:    General: Skin is warm and dry.     Findings: No rash.  Neurological:     Mental Status: He is alert. Mental status is at baseline.  Psychiatric:        Mood and Affect: Mood normal.       Lab Results  Component Value  Date   WBC 5.0 11/01/2020   HGB 15.4 11/01/2020   HCT 43.5 11/01/2020   PLT 164.0 11/01/2020   GLUCOSE 136 (H) 04/13/2021   CHOL 130 04/13/2021   TRIG 165.0 (H) 04/13/2021   HDL 41.30 04/13/2021   LDLDIRECT 67.0 02/27/2018   LDLCALC 56 04/13/2021   ALT 23 04/13/2021   AST 19 04/13/2021   NA 138 04/13/2021   K 3.9 04/13/2021   CL 105 04/13/2021   CREATININE 1.04 04/13/2021   BUN 22 04/13/2021   CO2 25 04/13/2021   TSH 3.49 04/13/2021   PSA 4.13 (H) 10/29/2019   HGBA1C 6.5 04/13/2021   MICROALBUR <0.7 10/29/2019     Assessment & Plan:    See Problem List for Assessment and Plan of chronic medical problems.

## 2021-10-11 ENCOUNTER — Ambulatory Visit (INDEPENDENT_AMBULATORY_CARE_PROVIDER_SITE_OTHER): Payer: Medicare HMO | Admitting: Internal Medicine

## 2021-10-11 VITALS — BP 130/78 | HR 86 | Temp 98.1°F | Ht 69.0 in | Wt 255.0 lb

## 2021-10-11 DIAGNOSIS — I2510 Atherosclerotic heart disease of native coronary artery without angina pectoris: Secondary | ICD-10-CM

## 2021-10-11 DIAGNOSIS — I10 Essential (primary) hypertension: Secondary | ICD-10-CM

## 2021-10-11 DIAGNOSIS — E038 Other specified hypothyroidism: Secondary | ICD-10-CM

## 2021-10-11 DIAGNOSIS — R7303 Prediabetes: Secondary | ICD-10-CM

## 2021-10-11 DIAGNOSIS — E782 Mixed hyperlipidemia: Secondary | ICD-10-CM | POA: Diagnosis not present

## 2021-10-11 NOTE — Assessment & Plan Note (Signed)
Chronic Regular exercise and healthy diet encouraged Check lipid panel  Continue atorvastatin 10 mg daily 

## 2021-10-11 NOTE — Assessment & Plan Note (Signed)
Chronic BP well controlled Continue  Metoprolol xl 50 mg daily,  cmp

## 2021-10-11 NOTE — Assessment & Plan Note (Signed)
Chronic Check a1c Low sugar / carb diet Stressed regular exercise  

## 2021-10-11 NOTE — Assessment & Plan Note (Signed)
Chronic Check TSH Clinically euthyroid

## 2021-10-11 NOTE — Assessment & Plan Note (Signed)
Chronic No symptoms consistent with angina Continue aspirin 81 mg daily, atorvastatin 10 mg daily Blood pressure well controlled Sugars controlled

## 2021-10-16 DIAGNOSIS — R972 Elevated prostate specific antigen [PSA]: Secondary | ICD-10-CM | POA: Diagnosis not present

## 2021-10-23 DIAGNOSIS — N401 Enlarged prostate with lower urinary tract symptoms: Secondary | ICD-10-CM | POA: Diagnosis not present

## 2021-10-23 DIAGNOSIS — N5201 Erectile dysfunction due to arterial insufficiency: Secondary | ICD-10-CM | POA: Diagnosis not present

## 2021-10-23 DIAGNOSIS — R351 Nocturia: Secondary | ICD-10-CM | POA: Diagnosis not present

## 2021-10-23 DIAGNOSIS — R972 Elevated prostate specific antigen [PSA]: Secondary | ICD-10-CM | POA: Diagnosis not present

## 2021-10-26 ENCOUNTER — Other Ambulatory Visit (INDEPENDENT_AMBULATORY_CARE_PROVIDER_SITE_OTHER): Payer: Medicare HMO

## 2021-10-26 DIAGNOSIS — E038 Other specified hypothyroidism: Secondary | ICD-10-CM

## 2021-10-26 DIAGNOSIS — I10 Essential (primary) hypertension: Secondary | ICD-10-CM

## 2021-10-26 DIAGNOSIS — E782 Mixed hyperlipidemia: Secondary | ICD-10-CM | POA: Diagnosis not present

## 2021-10-26 DIAGNOSIS — R7303 Prediabetes: Secondary | ICD-10-CM | POA: Diagnosis not present

## 2021-10-26 LAB — COMPREHENSIVE METABOLIC PANEL
ALT: 20 U/L (ref 0–53)
AST: 17 U/L (ref 0–37)
Albumin: 3.8 g/dL (ref 3.5–5.2)
Alkaline Phosphatase: 31 U/L — ABNORMAL LOW (ref 39–117)
BUN: 16 mg/dL (ref 6–23)
CO2: 25 mEq/L (ref 19–32)
Calcium: 9 mg/dL (ref 8.4–10.5)
Chloride: 106 mEq/L (ref 96–112)
Creatinine, Ser: 0.96 mg/dL (ref 0.40–1.50)
GFR: 78.23 mL/min (ref >60.00)
Glucose, Bld: 162 mg/dL — ABNORMAL HIGH (ref 70–99)
Potassium: 3.9 mEq/L (ref 3.5–5.1)
Sodium: 141 mEq/L (ref 135–145)
Total Bilirubin: 0.7 mg/dL (ref 0.2–1.2)
Total Protein: 6.7 g/dL (ref 6.0–8.3)

## 2021-10-26 LAB — LIPID PANEL
Cholesterol: 136 mg/dL (ref 0–200)
HDL: 43.7 mg/dL (ref >39.00)
LDL Cholesterol: 66 mg/dL (ref 0–99)
NonHDL: 92.45
Total CHOL/HDL Ratio: 3
Triglycerides: 134 mg/dL (ref 0.0–149.0)
VLDL: 26.8 mg/dL (ref 0.0–40.0)

## 2021-10-26 LAB — CBC WITH DIFFERENTIAL/PLATELET
Basophils Absolute: 0 10*3/uL (ref 0.0–0.1)
Basophils Relative: 1 % (ref 0.0–3.0)
Eosinophils Absolute: 0.2 10*3/uL (ref 0.0–0.7)
Eosinophils Relative: 3.9 % (ref 0.0–5.0)
HCT: 44.8 % (ref 39.0–52.0)
Hemoglobin: 15.2 g/dL (ref 13.0–17.0)
Lymphocytes Relative: 29.3 % (ref 12.0–46.0)
Lymphs Abs: 1.4 10*3/uL (ref 0.7–4.0)
MCHC: 34 g/dL (ref 30.0–36.0)
MCV: 97.4 fl (ref 78.0–100.0)
Monocytes Absolute: 0.5 10*3/uL (ref 0.1–1.0)
Monocytes Relative: 10.6 % (ref 3.0–12.0)
Neutro Abs: 2.6 10*3/uL (ref 1.4–7.7)
Neutrophils Relative %: 55.2 % (ref 43.0–77.0)
Platelets: 166 10*3/uL (ref 150.0–400.0)
RBC: 4.6 Mil/uL (ref 4.22–5.81)
RDW: 12.6 % (ref 11.5–15.5)
WBC: 4.7 10*3/uL (ref 4.0–10.5)

## 2021-10-26 LAB — HEMOGLOBIN A1C: Hgb A1c MFr Bld: 6.6 % — ABNORMAL HIGH (ref 4.6–6.5)

## 2021-10-27 ENCOUNTER — Ambulatory Visit (INDEPENDENT_AMBULATORY_CARE_PROVIDER_SITE_OTHER): Payer: Medicare HMO

## 2021-10-27 DIAGNOSIS — Z0000 Encounter for general adult medical examination without abnormal findings: Secondary | ICD-10-CM

## 2021-11-15 ENCOUNTER — Other Ambulatory Visit: Payer: Self-pay | Admitting: Internal Medicine

## 2021-11-28 ENCOUNTER — Other Ambulatory Visit: Payer: Self-pay | Admitting: Internal Medicine

## 2021-11-28 MED ORDER — METOPROLOL SUCCINATE ER 50 MG PO TB24: ORAL_TABLET | ORAL | 3 refills | Status: DC

## 2021-12-14 DIAGNOSIS — H35373 Puckering of macula, bilateral: Secondary | ICD-10-CM | POA: Diagnosis not present

## 2022-05-22 DIAGNOSIS — L821 Other seborrheic keratosis: Secondary | ICD-10-CM | POA: Diagnosis not present

## 2022-05-22 DIAGNOSIS — D225 Melanocytic nevi of trunk: Secondary | ICD-10-CM | POA: Diagnosis not present

## 2022-05-22 DIAGNOSIS — L905 Scar conditions and fibrosis of skin: Secondary | ICD-10-CM | POA: Diagnosis not present

## 2022-05-22 DIAGNOSIS — D485 Neoplasm of uncertain behavior of skin: Secondary | ICD-10-CM | POA: Diagnosis not present

## 2022-05-22 DIAGNOSIS — B351 Tinea unguium: Secondary | ICD-10-CM | POA: Diagnosis not present

## 2022-05-22 DIAGNOSIS — L309 Dermatitis, unspecified: Secondary | ICD-10-CM | POA: Diagnosis not present

## 2022-05-22 DIAGNOSIS — L570 Actinic keratosis: Secondary | ICD-10-CM | POA: Diagnosis not present

## 2022-05-22 DIAGNOSIS — Z85828 Personal history of other malignant neoplasm of skin: Secondary | ICD-10-CM | POA: Diagnosis not present

## 2022-06-11 NOTE — Progress Notes (Signed)
Patient ID: Edwin [name], male   DOB: [birth year], 77 y.o.   MRN: [id number]     Cardiology Office Note   Date:  06/18/2022   ID:  Ecker, Reilley [birth year], MRN [id number]  PCP:  [name], MD  Cardiologist:   Jenkins Rouge, MD   No chief complaint on file.      History of Present Illness: VIRTUS BANISH is a 77 y.o. male initially seen for abnormal ECG 09/2014    Had ETT with PA in April Exercised for about 7 mintues Baseline ECG with RBBB with nonspecific ST changes  Should not have had treadmill with baseline abnormal ECG  As these were accentuated with stress No VT. CRF;s HTN on Rx including statin  Sleep apnea   Wears CPAP  .    09/11/14 cardiac CT showed very high calcium score 1010 which was 91 st percentile with moderate mid LAD/RCA disease Last non ischemic myovue Was done 12/26/17 Discussed updating this since its been over 4 years   Likes to travel to [place] with grand kids and has been to [facility] Dr Tamala Julian for chronic back and shoulder pain Has had injections   No cardiac complaints Compliant with his meds and CPAP   Seeing grand kids in [place] and [place]   Discussed weight loss and diet regarding elevated BP He will check at home and call if elevated   Past Medical History:  Diagnosis Date   BPH (benign prostatic hypertrophy)    Cancer (HCC)    Basal cell of face, [name]   Diverticulosis    Rosanna Randy syndrome    Hyperglycemia    Hyperlipidemia    NMR Lipoprofile 2005; LDL 146 (2007/1330), HDL 38, TG 169.  LDL goal = <100; Framingham Study LDL goal =<130   Hypertension    OSA on CPAP    Tubular adenoma of colon 01/19/08    Past Surgical History:  Procedure Laterality Date   COLONOSCOPY  2014   neg; [facility]   COLONOSCOPY W/ POLYPECTOMY  2004 & 2009   Tics; [facility]   KNEE ARTHROSCOPY     [name], left knee   TONSILLECTOMY     WISDOM TOOTH EXTRACTION       Current Outpatient Medications  Medication Sig  Dispense Refill   albuterol (VENTOLIN HFA) 108 (90 Base) MCG/ACT inhaler Inhale 2 puffs into the lungs every 6 (six) hours as needed (wheezing or cough). 8 g 0   aspirin 81 MG tablet Take 81 mg by mouth daily.     atorvastatin (LIPITOR) 10 MG tablet TAKE 1 TABLET EVERY OTHER DAY 45 tablet 3   Cholecalciferol (VITAMIN D3) 1000 UNITS CAPS Take 1 capsule by mouth daily.     Cyanocobalamin (VITAMIN B-12 PO) Take by mouth as directed.     metoprolol succinate (TOPROL-XL) 50 MG 24 hr tablet Take with or immediately following a meal. 90 tablet 3   Multiple Vitamin (MULTIVITAMIN) tablet Take 1 tablet by mouth daily.     naproxen sodium (ALEVE) 220 MG tablet Take 220 mg by mouth daily as needed.     nitroGLYCERIN (NITROSTAT) 0.4 MG SL tablet Place 1 tablet (0.4 mg total) under the tongue every 5 (five) minutes as needed for chest pain. 25 tablet 3   Turmeric (QC TUMERIC COMPLEX PO) Take by mouth daily.     azelastine (ASTELIN) 0.1 % nasal spray Place into the nose.     No current facility-administered  medications for this visit.    Allergies:   Patient has no known allergies.    Social History:  The patient  reports that he has never smoked. He has never used smokeless tobacco. He reports current alcohol use. He reports that he does not use drugs.   Family History:  The patient's family history includes Appendicitis in his brother and sister; Asthma in his brother; Cancer in his brother; Diabetes in his father; Diverticulosis in his mother; Heart attack (age of onset: 90) in his maternal grandfather; Heart attack (age of onset: 38) in his maternal grandmother; Heart disease (age of onset: 57) in his father; Hypertension in his father and sister; Parkinsonism in his maternal grandfather and mother; Stroke in his mother; Stroke (age of onset: 63) in his paternal grandmother.    ROS:  Please see the history of present illness.   Otherwise, review of systems are positive for none.   All other systems are  reviewed and negative.      PHYSICAL EXAM: VS:  BP (!) 142/90 (BP Location: Right Arm, Patient Position: Sitting, Cuff Size: Large)   Pulse 68   Ht 5' 9"$  (1.753 m)   Wt 265 lb 4 oz (120.3 kg)   SpO2 96%   BMI 39.17 kg/m  , BMI Body mass index is 39.17 kg/m. Affect appropriate Healthy:  appears stated age 77: normal Neck supple with no adenopathy JVP normal no bruits no thyromegaly Lungs clear with no wheezing and good diaphragmatic motion Heart:  S1/S2 no murmur, no rub, gallop or click PMI normal Abdomen: benighn, BS positve, no tenderness, no AAA no bruit.  No HSM or HJR Distal pulses intact with no bruits No edema Neuro non-focal Skin warm and dry No muscular weakness   EKG:   06/18/2022 SR rate 65 RBBB no acute changes    Recent Labs: 10/26/2021: ALT 20; BUN 16; Creatinine, Ser 0.96; Hemoglobin 15.2; Platelets 166.0; Potassium 3.9; Sodium 141    Lipid Panel    Component Value Date/Time   CHOL 136 10/26/2021 0746   CHOL 202 (H) 06/02/2014 1116   TRIG 134.0 10/26/2021 0746   TRIG 213 (H) 06/02/2014 1116   TRIG 179 (H) 05/13/2006 1239   HDL 43.70 10/26/2021 0746   HDL 44 06/02/2014 1116   CHOLHDL 3 10/26/2021 0746   VLDL 26.8 10/26/2021 0746   LDLCALC 66 10/26/2021 0746   LDLCALC 115 (H) 06/02/2014 1116   LDLDIRECT 67.0 02/27/2018 1110      Wt Readings from Last 3 Encounters:  06/18/22 265 lb 4 oz (120.3 kg)  10/11/21 255 lb (115.7 kg)  09/18/21 260 lb (117.9 kg)      Other studies Reviewed: Additional studies/ records that were reviewed today include: Epic notes  ETT by DR Linna Darner. Myovue and cardiac CT 2016 Myovue 12/26/17    ASSESSMENT AND PLAN:  1. CAD: Moderate LAD/Circ disease by cardiac CT June 2016 Normal perfusion study 12/26/17 continue medical Rx Update stress myovue   2. Chol: On statin LDL 69   3. RBBB:  No change on ECG yearly ECG   4. PAC;s  Benign asymptomatic on beta blocker   5. HTN:  Well controlled.  Continue current  medications and low sodium Dash type diet.    6. Derm:  Post mow's with basal cell removal left nares no recurrence   7. OSA:  Continue CPAP discussed weight loss strategies   8. DM:  Discussed low carb diet.  Target hemoglobin A1c is 6.5 or  less.  Continue current medications. A1c 5.9 on 10/29/19            Myovue  Disposition:   FU with me in a year  if non ischemic    Jenkins Rouge

## 2022-06-18 ENCOUNTER — Encounter: Payer: Self-pay | Admitting: Cardiovascular Disease

## 2022-06-18 ENCOUNTER — Ambulatory Visit: Payer: Medicare HMO | Attending: Cardiovascular Disease | Admitting: Cardiovascular Disease

## 2022-06-18 VITALS — BP 142/90 | HR 68 | Ht 69.0 in | Wt 265.2 lb

## 2022-06-18 DIAGNOSIS — R072 Precordial pain: Secondary | ICD-10-CM

## 2022-06-18 DIAGNOSIS — I10 Essential (primary) hypertension: Secondary | ICD-10-CM

## 2022-06-18 MED ORDER — NITROGLYCERIN 0.4 MG SL SUBL: 0.4000 mg | SUBLINGUAL_TABLET | SUBLINGUAL | 3 refills | Status: DC | PRN

## 2022-06-18 NOTE — Addendum Note (Signed)
Addended by: Aris Georgia, Sherrie Marsan L on: 06/18/2022 08:35 AM   Modules accepted: Orders

## 2022-06-18 NOTE — Patient Instructions (Addendum)
Medication Instructions:  Your physician recommends that you continue on your current medications as directed. Please refer to the Current Medication list given to you today.  *If you need a refill on your cardiac medications before your next appointment, please call your pharmacy*  Lab Work: If you have labs (blood work) drawn today and your tests are completely normal, you will receive your results only by: [place] (if you have MyChart) OR A paper copy in the mail If you have any lab test that is abnormal or we need to change your treatment, we will call you to review the results.  Testing/Procedures: Your physician has requested that you have en exercise stress myoview. For further information please visit [email or web address]. Please follow instruction sheet, as given.  Follow-Up: At [facility], you and your health needs are our priority.  As part of our continuing mission to provide you with exceptional heart care, we have created designated Provider Care Teams.  These Care Teams include your primary Cardiologist (physician) and Advanced Practice Providers (APPs -  Physician Assistants and Nurse Practitioners) who all work together to provide you with the care you need, when you need it.  We recommend signing up for the patient portal called "MyChart".  Sign up information is provided on this After Visit Summary.  MyChart is used to connect with patients for Virtual Visits (Telemedicine).  Patients are able to view lab/test results, encounter notes, upcoming appointments, etc.  Non-urgent messages can be sent to your provider as well.   To learn more about what you can do with MyChart, go to [email or web address].    Your next appointment:   1 year(s)  Provider:   Jenkins Rouge, MD

## 2022-06-27 ENCOUNTER — Encounter (HOSPITAL_COMMUNITY): Payer: Medicare HMO

## 2022-07-02 ENCOUNTER — Telehealth (HOSPITAL_COMMUNITY): Payer: Self-pay | Admitting: *Deleted

## 2022-07-02 NOTE — Telephone Encounter (Signed)
Left message on voicemail per DPR in reference to upcoming appointment scheduled on  07/04/22 with detailed instructions given per Myocardial Perfusion Study Information Sheet for the test. LM to arrive 15 minutes early, and that it is imperative to arrive on time for appointment to keep from having the test rescheduled. If you need to cancel or reschedule your appointment, please call the office within 24 hours of your appointment. Failure to do so may result in a cancellation of your appointment, and a $50 no show fee. Phone number given for call back for any questions. Kirstie Peri

## 2022-07-04 ENCOUNTER — Ambulatory Visit (HOSPITAL_COMMUNITY): Payer: Medicare HMO | Attending: Cardiovascular Disease

## 2022-07-04 ENCOUNTER — Other Ambulatory Visit: Payer: Self-pay | Admitting: Cardiovascular Disease

## 2022-07-04 DIAGNOSIS — E7849 Other hyperlipidemia: Secondary | ICD-10-CM

## 2022-07-04 DIAGNOSIS — R072 Precordial pain: Secondary | ICD-10-CM | POA: Diagnosis not present

## 2022-07-04 DIAGNOSIS — R931 Abnormal findings on diagnostic imaging of heart and coronary circulation: Secondary | ICD-10-CM

## 2022-07-04 LAB — MYOCARDIAL PERFUSION IMAGING
Angina Index: 0
Base ST Depression (mm): 0 mm
Duke Treadmill Score: 5
Exercise duration (min): 5 min
Exercise duration (sec): 2 s
LV dias vol: 95 mL (ref 62–150)
LV sys vol: 38 mL
MPHR: 146 {beats}/min
Nuc Stress EF: 60 %
Peak HR: 146 {beats}/min
Percent HR: 100 %
Rest HR: 60 {beats}/min
Rest Nuclear Isotope Dose: 10.2 mCi
SRS: 0
SSS: 0
ST Depression (mm): 0 mm
Stress Nuclear Isotope Dose: 30.6 mCi
TID: 0.87

## 2022-07-04 MED ORDER — TECHNETIUM TC 99M TETROFOSMIN IV KIT
30.6000 | PACK | Freq: Once | INTRAVENOUS | Status: AC | PRN
  Administered 2022-07-04: 30.6 via INTRAVENOUS

## 2022-07-04 MED ORDER — TECHNETIUM TC 99M TETROFOSMIN IV KIT
10.2000 | PACK | Freq: Once | INTRAVENOUS | Status: AC | PRN
  Administered 2022-07-04: 10.2 via INTRAVENOUS

## 2022-08-20 ENCOUNTER — Encounter: Payer: Self-pay | Admitting: Internal Medicine

## 2022-08-20 NOTE — Patient Instructions (Addendum)
Blood work was ordered.   The lab is on the first floor.    Medications changes include :   triamcinolone cream for leg rash     Return in about 6 months (around 02/20/2023) for follow up.    Health Maintenance, Male Adopting a healthy lifestyle and getting preventive care are important in promoting health and wellness. Ask your health care provider about: The right schedule for you to have regular tests and exams. Things you can do on your own to prevent diseases and keep yourself healthy. What should I know about diet, weight, and exercise? Eat a healthy diet  Eat a diet that includes plenty of vegetables, fruits, low-fat dairy products, and lean protein. Do not eat a lot of foods that are high in solid fats, added sugars, or sodium. Maintain a healthy weight Body mass index (BMI) is a measurement that can be used to identify possible weight problems. It estimates body fat based on height and weight. Your health care provider can help determine your BMI and help you achieve or maintain a healthy weight. Get regular exercise Get regular exercise. This is one of the most important things you can do for your health. Most adults should: Exercise for at least 150 minutes each week. The exercise should increase your heart rate and make you sweat (moderate-intensity exercise). Do strengthening exercises at least twice a week. This is in addition to the moderate-intensity exercise. Spend less time sitting. Even light physical activity can be beneficial. Watch cholesterol and blood lipids Have your blood tested for lipids and cholesterol at 75 years of age, then have this test every 5 years. You may need to have your cholesterol levels checked more often if: Your lipid or cholesterol levels are high. You are older than 75 years of age. You are at high risk for heart disease. What should I know about cancer screening? Many types of cancers can be detected early and may often be  prevented. Depending on your health history and family history, you may need to have cancer screening at various ages. This may include screening for: Colorectal cancer. Prostate cancer. Skin cancer. Lung cancer. What should I know about heart disease, diabetes, and high blood pressure? Blood pressure and heart disease High blood pressure causes heart disease and increases the risk of stroke. This is more likely to develop in people who have high blood pressure readings or are overweight. Talk with your health care provider about your target blood pressure readings. Have your blood pressure checked: Every 3-5 years if you are 15-29 years of age. Every year if you are 48 years old or older. If you are between the ages of 77 and 87 and are a current or former smoker, ask your health care provider if you should have a one-time screening for abdominal aortic aneurysm (AAA). Diabetes Have regular diabetes screenings. This checks your fasting blood sugar level. Have the screening done: Once every three years after age 65 if you are at a normal weight and have a low risk for diabetes. More often and at a younger age if you are overweight or have a high risk for diabetes. What should I know about preventing infection? Hepatitis B If you have a higher risk for hepatitis B, you should be screened for this virus. Talk with your health care provider to find out if you are at risk for hepatitis B infection. Hepatitis C Blood testing is recommended for: Everyone born from 20 through  36. Anyone with known risk factors for hepatitis C. Sexually transmitted infections (STIs) You should be screened each year for STIs, including gonorrhea and chlamydia, if: You are sexually active and are younger than 75 years of age. You are older than 75 years of age and your health care provider tells you that you are at risk for this type of infection. Your sexual activity has changed since you were last screened,  and you are at increased risk for chlamydia or gonorrhea. Ask your health care provider if you are at risk. Ask your health care provider about whether you are at high risk for HIV. Your health care provider may recommend a prescription medicine to help prevent HIV infection. If you choose to take medicine to prevent HIV, you should first get tested for HIV. You should then be tested every 3 months for as long as you are taking the medicine. Follow these instructions at home: Alcohol use Do not drink alcohol if your health care provider tells you not to drink. If you drink alcohol: Limit how much you have to 0-2 drinks a day. Know how much alcohol is in your drink. In the [place] one drink equals one 12 oz bottle of beer (355 mL), one 5 oz glass of wine (148 mL), or one 1 oz glass of hard liquor (44 mL). Lifestyle Do not use any products that contain nicotine or tobacco. These products include cigarettes, chewing tobacco, and vaping devices, such as e-cigarettes. If you need help quitting, ask your health care provider. Do not use street drugs. Do not share needles. Ask your health care provider for help if you need support or information about quitting drugs. General instructions Schedule regular health, dental, and eye exams. Stay current with your vaccines. Tell your health care provider if: You often feel depressed. You have ever been abused or do not feel safe at home. Summary Adopting a healthy lifestyle and getting preventive care are important in promoting health and wellness. Follow your health care provider's instructions about healthy diet, exercising, and getting tested or screened for diseases. Follow your health care provider's instructions on monitoring your cholesterol and blood pressure. This information is not intended to replace advice given to you by your health care provider. Make sure you discuss any questions you have with your health care provider. Document Revised:  09/12/2020 Document Reviewed: 09/12/2020 Elsevier Patient Education  2023 [employer].

## 2022-08-20 NOTE — Progress Notes (Unsigned)
Subjective:    Patient ID: Edwin [name], male    DOB: Oct 16, 1945, 77 y.o.   MRN: [id number]     HPI Edwin [name] is here for a physical exam and his chronic medical problems.   Has some spots on his legs - lower legs - hyperpigmented, itchy.  Hydrocortisone 1% is helping but it is not going away.  He needs new CPAP machine.  Not currently following with pulmonary.   Medications and allergies reviewed with patient and updated if appropriate.  Current Outpatient Medications on File Prior to Visit  Medication Sig Dispense Refill   albuterol (VENTOLIN HFA) 108 (90 Base) MCG/ACT inhaler Inhale 2 puffs into the lungs every 6 (six) hours as needed (wheezing or cough). 8 g 0   aspirin 81 MG tablet Take 81 mg by mouth daily.     atorvastatin (LIPITOR) 10 MG tablet TAKE 1 TABLET EVERY OTHER DAY 45 tablet 3   Cholecalciferol (VITAMIN D3) 1000 UNITS CAPS Take 1 capsule by mouth daily.     Cyanocobalamin (VITAMIN B-12 PO) Take by mouth as directed.     metoprolol succinate (TOPROL-XL) 50 MG 24 hr tablet Take with or immediately following a meal. 90 tablet 3   Multiple Vitamin (MULTIVITAMIN) tablet Take 1 tablet by mouth daily.     naproxen sodium (ALEVE) 220 MG tablet Take 220 mg by mouth daily as needed.     nitroGLYCERIN (NITROSTAT) 0.4 MG SL tablet Place 1 tablet (0.4 mg total) under the tongue every 5 (five) minutes as needed for chest pain. 25 tablet 3   Turmeric (QC TUMERIC COMPLEX PO) Take by mouth daily.     azelastine (ASTELIN) 0.1 % nasal spray Place into the nose.     No current facility-administered medications on file prior to visit.    Review of Systems  Constitutional:  Negative for fever.  Eyes:  Negative for visual disturbance.  Respiratory:  Negative for cough, shortness of breath and wheezing.   Cardiovascular:  Negative for chest pain, palpitations and leg swelling.  Gastrointestinal:  Negative for abdominal pain, blood in stool, constipation and diarrhea.       No  gerd  Genitourinary:  Negative for difficulty urinating, dysuria and hematuria.  Musculoskeletal:  Negative for arthralgias and back pain.       Achiness, stiffness  Skin:  Positive for rash (b/l lower legs - itchy).  Neurological:  Negative for light-headedness and headaches.  Psychiatric/Behavioral:  Negative for dysphoric mood. The patient is not nervous/anxious.        Objective:   Vitals:   08/21/22 0927  BP: 130/80  Pulse: 65  Temp: 98.3 F (36.8 C)  SpO2: 98%   Filed Weights   08/21/22 0927  Weight: 260 lb (117.9 kg)   Body mass index is 38.4 kg/m.  BP Readings from Last 3 Encounters:  08/21/22 130/80  06/18/22 (!) 142/90  10/11/21 130/78    Wt Readings from Last 3 Encounters:  08/21/22 260 lb (117.9 kg)  06/18/22 265 lb 4 oz (120.3 kg)  10/11/21 255 lb (115.7 kg)      Physical Exam Constitutional: He appears well-developed and well-nourished. No distress.  HENT:  Head: Normocephalic and atraumatic.  Right Ear: External ear normal.  Left Ear: External ear normal.  Normal ear canals and TM b/l  Mouth/Throat: Oropharynx is clear and moist. Eyes: Conjunctivae and EOM are normal.  Neck: Neck supple. No tracheal deviation present. No thyromegaly present.  No carotid bruit  Cardiovascular:  Normal rate, regular rhythm, normal heart sounds and intact distal pulses.   No murmur heard.  No lower extremity edema. Pulmonary/Chest: Effort normal and breath sounds normal. No respiratory distress. He has no wheezes. He has no rales.  Abdominal: Soft. He exhibits no distension. There is no tenderness.  Genitourinary: deferred  Lymphadenopathy:   He has no cervical adenopathy.  Skin: Skin is warm and dry. He is not diaphoretic. Raison-grape sided areas of hyperpigmentation on b/l lower legs- dry skin b/l LE Psychiatric: He has a normal mood and affect. His behavior is normal.         Assessment & Plan:   Physical exam: Screening blood work  ordered Exercise    walking 5 days per week Weight  has lost a little weight - doing weight watchers meal plan.  Advised decreased portions Substance abuse   none   Reviewed recommended immunizations.   Health Maintenance  Topic Date Due   DTaP/Tdap/Td (2 - Tdap) 05/11/2020   COVID-19 Vaccine (4 - 2023-24 season) 01/05/2022   Medicare Annual Wellness (AWV)  10/28/2022   INFLUENZA VACCINE  12/06/2022   COLONOSCOPY (Pts 45-80yrs Insurance coverage will need to be confirmed)  02/03/2023   Pneumonia Vaccine 15+ Years old  Completed   Hepatitis C Screening  Completed   Zoster Vaccines- Shingrix  Completed   HPV VACCINES  Aged Out     See Problem List for Assessment and Plan of chronic medical problems.

## 2022-08-21 ENCOUNTER — Ambulatory Visit (INDEPENDENT_AMBULATORY_CARE_PROVIDER_SITE_OTHER): Payer: Medicare HMO | Admitting: Internal Medicine

## 2022-08-21 VITALS — BP 130/80 | HR 65 | Temp 98.3°F | Ht 69.0 in | Wt 260.0 lb

## 2022-08-21 DIAGNOSIS — E782 Mixed hyperlipidemia: Secondary | ICD-10-CM

## 2022-08-21 DIAGNOSIS — R21 Rash and other nonspecific skin eruption: Secondary | ICD-10-CM

## 2022-08-21 DIAGNOSIS — E038 Other specified hypothyroidism: Secondary | ICD-10-CM | POA: Diagnosis not present

## 2022-08-21 DIAGNOSIS — Z0000 Encounter for general adult medical examination without abnormal findings: Secondary | ICD-10-CM

## 2022-08-21 DIAGNOSIS — I2510 Atherosclerotic heart disease of native coronary artery without angina pectoris: Secondary | ICD-10-CM | POA: Diagnosis not present

## 2022-08-21 DIAGNOSIS — R7303 Prediabetes: Secondary | ICD-10-CM

## 2022-08-21 DIAGNOSIS — I10 Essential (primary) hypertension: Secondary | ICD-10-CM | POA: Diagnosis not present

## 2022-08-21 DIAGNOSIS — G4733 Obstructive sleep apnea (adult) (pediatric): Secondary | ICD-10-CM

## 2022-08-21 DIAGNOSIS — D692 Other nonthrombocytopenic purpura: Secondary | ICD-10-CM

## 2022-08-21 MED ORDER — TRIAMCINOLONE ACETONIDE 0.1 % EX CREA: 1.0000 | TOPICAL_CREAM | Freq: Two times a day (BID) | CUTANEOUS | 0 refills | Status: DC

## 2022-08-21 NOTE — Assessment & Plan Note (Addendum)
Chronic No symptoms consistent with angina Continue aspirin 81 mg daily, atorvastatin 10 mg daily Blood pressure well controlled-continue current medications Sugars controlled diet encouraged regular exercise, heart healthy diet Lipid panel, CMP, CBC

## 2022-08-21 NOTE — Assessment & Plan Note (Addendum)
Chronic Uses cpap nightly and finds it beneficial Uses nose pillows Currently has a  Resmed machine - setting auto 5-11 cm H2O.  His current machine is breaking down and he is concerned it will not last much longer Using nasal pillows Will request new CPAP machine-Uses [facility]

## 2022-08-21 NOTE — Assessment & Plan Note (Addendum)
Acute B/l LEs - hyperpigmented areas - itchy Otc hydrocortisone 1% helping but not going away Start triamcinolone 0.1% cream bid - advised not to use long term

## 2022-08-21 NOTE — Assessment & Plan Note (Signed)
Chronic Check a1c Low sugar / carb diet Stressed regular exercise  

## 2022-08-21 NOTE — Assessment & Plan Note (Signed)
Chronic BMI 38.4 with comorbidities of hyperlipidemia, Hypertension and CAD, prediabetes  Advised weight loss-he knows he needs to work on weight loss Continue walking regularly He is eating more of a weight watchers based diet Advised decreasing portions

## 2022-08-21 NOTE — Assessment & Plan Note (Signed)
Chronic Mild No visible bruising

## 2022-08-21 NOTE — Assessment & Plan Note (Signed)
Chronic Regular exercise and healthy diet encouraged Check lipid panel, CMP Continue atorvastatin 10 mg daily 

## 2022-08-21 NOTE — Assessment & Plan Note (Addendum)
Chronic BP controlled Continue  Metoprolol xl 50 mg daily  cmp, CBC

## 2022-08-21 NOTE — Assessment & Plan Note (Signed)
Chronic Check TSH Clinically euthyroid 

## 2022-08-22 ENCOUNTER — Other Ambulatory Visit (INDEPENDENT_AMBULATORY_CARE_PROVIDER_SITE_OTHER): Payer: Medicare HMO

## 2022-08-22 DIAGNOSIS — R7303 Prediabetes: Secondary | ICD-10-CM

## 2022-08-22 DIAGNOSIS — I2510 Atherosclerotic heart disease of native coronary artery without angina pectoris: Secondary | ICD-10-CM | POA: Diagnosis not present

## 2022-08-22 DIAGNOSIS — I10 Essential (primary) hypertension: Secondary | ICD-10-CM

## 2022-08-22 DIAGNOSIS — E038 Other specified hypothyroidism: Secondary | ICD-10-CM | POA: Diagnosis not present

## 2022-08-22 DIAGNOSIS — E782 Mixed hyperlipidemia: Secondary | ICD-10-CM | POA: Diagnosis not present

## 2022-08-22 LAB — COMPREHENSIVE METABOLIC PANEL
ALT: 16 U/L (ref 0–53)
AST: 15 U/L (ref 0–37)
Albumin: 4.2 g/dL (ref 3.5–5.2)
Alkaline Phosphatase: 35 U/L — ABNORMAL LOW (ref 39–117)
BUN: 24 mg/dL — ABNORMAL HIGH (ref 6–23)
CO2: 26 mEq/L (ref 19–32)
Calcium: 9 mg/dL (ref 8.4–10.5)
Chloride: 102 mEq/L (ref 96–112)
Creatinine, Ser: 0.98 mg/dL (ref 0.40–1.50)
GFR: 75.88 mL/min (ref >60.00)
Glucose, Bld: 219 mg/dL — ABNORMAL HIGH (ref 70–99)
Potassium: 4.2 mEq/L (ref 3.5–5.1)
Sodium: 138 mEq/L (ref 135–145)
Total Bilirubin: 0.7 mg/dL (ref 0.2–1.2)
Total Protein: 6.8 g/dL (ref 6.0–8.3)

## 2022-08-22 LAB — CBC WITH DIFFERENTIAL/PLATELET
Basophils Absolute: 0.1 10*3/uL (ref 0.0–0.1)
Basophils Relative: 1.2 % (ref 0.0–3.0)
Eosinophils Absolute: 0.2 10*3/uL (ref 0.0–0.7)
Eosinophils Relative: 4.2 % (ref 0.0–5.0)
HCT: 43.7 % (ref 39.0–52.0)
Hemoglobin: 15.2 g/dL (ref 13.0–17.0)
Lymphocytes Relative: 28.7 % (ref 12.0–46.0)
Lymphs Abs: 1.4 10*3/uL (ref 0.7–4.0)
MCHC: 34.8 g/dL (ref 30.0–36.0)
MCV: 96 fl (ref 78.0–100.0)
Monocytes Absolute: 0.5 10*3/uL (ref 0.1–1.0)
Monocytes Relative: 10.7 % (ref 3.0–12.0)
Neutro Abs: 2.7 10*3/uL (ref 1.4–7.7)
Neutrophils Relative %: 55.2 % (ref 43.0–77.0)
Platelets: 165 10*3/uL (ref 150.0–400.0)
RBC: 4.55 Mil/uL (ref 4.22–5.81)
RDW: 12.5 % (ref 11.5–15.5)
WBC: 4.8 10*3/uL (ref 4.0–10.5)

## 2022-08-22 LAB — LIPID PANEL
Cholesterol: 125 mg/dL (ref 0–200)
HDL: 32.7 mg/dL — ABNORMAL LOW (ref >39.00)
NonHDL: 92.08
Total CHOL/HDL Ratio: 4
Triglycerides: 313 mg/dL — ABNORMAL HIGH (ref 0.0–149.0)
VLDL: 62.6 mg/dL — ABNORMAL HIGH (ref 0.0–40.0)

## 2022-08-22 LAB — MICROALBUMIN / CREATININE URINE RATIO
Creatinine,U: 131.8 mg/dL
Microalb Creat Ratio: 1.8 mg/g (ref 0.0–30.0)
Microalb, Ur: 2.4 mg/dL — ABNORMAL HIGH (ref 0.0–1.9)

## 2022-08-22 LAB — LDL CHOLESTEROL, DIRECT: Direct LDL: 43 mg/dL

## 2022-08-22 LAB — HEMOGLOBIN A1C: Hgb A1c MFr Bld: 8.6 % — ABNORMAL HIGH (ref 4.6–6.5)

## 2022-08-22 LAB — TSH: TSH: 4.56 u[IU]/mL (ref 0.35–5.50)

## 2022-10-09 ENCOUNTER — Telehealth: Payer: Self-pay

## 2022-10-09 NOTE — Telephone Encounter (Signed)
Patient would like an update on new CPAP machine, he states he has not heard anything from anyone after speaking with Dr. Lawerance Bach about needing a new one at his last office visit on 08/21/22.  Advised patient I would send a message to clinical staff and have someone follow up with him in regards to status. Please call patient back with status.

## 2022-10-10 NOTE — Telephone Encounter (Signed)
Order sent over to [facility]

## 2022-10-15 DIAGNOSIS — G4733 Obstructive sleep apnea (adult) (pediatric): Secondary | ICD-10-CM

## 2022-10-24 ENCOUNTER — Ambulatory Visit (INDEPENDENT_AMBULATORY_CARE_PROVIDER_SITE_OTHER): Payer: Medicare HMO

## 2022-10-24 VITALS — Ht 69.0 in | Wt 250.0 lb

## 2022-10-24 DIAGNOSIS — Z0000 Encounter for general adult medical examination without abnormal findings: Secondary | ICD-10-CM | POA: Diagnosis not present

## 2022-10-24 DIAGNOSIS — Z1211 Encounter for screening for malignant neoplasm of colon: Secondary | ICD-10-CM | POA: Diagnosis not present

## 2022-10-24 NOTE — Progress Notes (Signed)
Subjective:   Edwin [name] is a 77 y.o. male who presents for Medicare Annual/Subsequent preventive examination.  Visit Complete: Virtual  I connected with  Edwin [name] on 10/24/22 by a audio enabled telemedicine application and verified that I am speaking with the correct person using two identifiers.  Patient Location: Home  Provider Location: Office/Clinic  I discussed the limitations of evaluation and management by telemedicine. The patient expressed understanding and agreed to proceed.  Review of Systems     Cardiac Risk Factors include: advanced age (>68men, >55 women)     Objective:    Today's Vitals   10/24/22 1028  Weight: 250 lb (113.4 kg)  Height: 5\' 9"  (1.753 m)  PainSc: 0-No pain   Body mass index is 36.92 kg/m.     10/24/2022   10:35 AM 10/27/2021   11:04 AM 07/11/2017    9:18 AM  Advanced Directives  Does Patient Have a Medical Advance Directive? Yes Yes Yes  Type of Estate agent of [place];Living will Living will;Healthcare Power of [employer] Power of [place];Living will  Does patient want to make changes to medical advance directive?  No - Patient declined   Copy of Healthcare Power of Attorney in Chart? No - copy requested No - copy requested No - copy requested    Current Medications (verified) Outpatient Encounter Medications as of 10/24/2022  Medication Sig   albuterol (VENTOLIN HFA) 108 (90 Base) MCG/ACT inhaler Inhale 2 puffs into the lungs every 6 (six) hours as needed (wheezing or cough).   aspirin 81 MG tablet Take 81 mg by mouth daily.   atorvastatin (LIPITOR) 10 MG tablet TAKE 1 TABLET EVERY OTHER DAY   azelastine (ASTELIN) 0.1 % nasal spray Place into the nose.   Cholecalciferol (VITAMIN D3) 1000 UNITS CAPS Take 1 capsule by mouth daily.   Cyanocobalamin (VITAMIN B-12 PO) Take by mouth as directed.   metoprolol succinate (TOPROL-XL) 50 MG 24 hr tablet Take with or immediately following a meal.    Multiple Vitamin (MULTIVITAMIN) tablet Take 1 tablet by mouth daily.   naproxen sodium (ALEVE) 220 MG tablet Take 220 mg by mouth daily as needed.   nitroGLYCERIN (NITROSTAT) 0.4 MG SL tablet Place 1 tablet (0.4 mg total) under the tongue every 5 (five) minutes as needed for chest pain.   triamcinolone cream (KENALOG) 0.1 % Apply 1 Application topically 2 (two) times daily.   Turmeric (QC TUMERIC COMPLEX PO) Take by mouth daily.   No facility-administered encounter medications on file as of 10/24/2022.    Allergies (verified) Patient has no known allergies.   History: Past Medical History:  Diagnosis Date   BPH (benign prostatic hypertrophy)    Cancer (HCC)    Basal cell of face, Dr. Karlyn Agee   Diverticulosis    Sullivan Lone syndrome    Hyperglycemia    Hyperlipidemia    NMR Lipoprofile 2005; LDL 146 (2007/1330), HDL 38, TG 169.  LDL goal = <100; Framingham Study LDL goal =<130   Hypertension    OSA on CPAP    Tubular adenoma of colon 01/19/08   Past Surgical History:  Procedure Laterality Date   COLONOSCOPY  2014   neg; [facility]   COLONOSCOPY W/ POLYPECTOMY  2004 & 2009   Tics; [facility]   KNEE ARTHROSCOPY     [name], left knee   TONSILLECTOMY     WISDOM TOOTH EXTRACTION     Family History  Problem Relation Age of Onset  Diverticulosis Mother    Stroke Mother        late 67s   Parkinsonism Mother    Diabetes Father    Hypertension Father    Heart disease Father 18       CABG   Cancer Brother        lymphoma   Appendicitis Brother        ruptured   Asthma Brother        childhood   Stroke Paternal Grandmother 62   Heart attack Maternal Grandmother 1       questionable   Heart attack Maternal Grandfather 28   Parkinsonism Maternal Grandfather    Hypertension Sister    Appendicitis Sister        ruptured   Colon cancer Neg Hx    Esophageal cancer Neg Hx    Rectal cancer Neg Hx    Stomach cancer Neg Hx    Social History   Socioeconomic  History   Marital status: Married    Spouse name: Not on file   Number of children: 2   Years of education: 18   Highest education level: Not on file  Occupational History   Occupation: Gaffer // Retired    Associate Professor: Technical sales engineer [employer]  Tobacco Use   Smoking status: Never   Smokeless tobacco: Never  Vaping Use   Vaping Use: Never used  Substance and Sexual Activity   Alcohol use: Yes    Alcohol/week: 0.0 standard drinks of alcohol    Comment: socially, 3-5 beers/week   Drug use: No   Sexual activity: Yes  Other Topics Concern   Not on file  Social History Narrative   Fun: Travel, read, movies, concerts    Denies abuse and feels safe at home.    Social Determinants of Health   Financial Resource Strain: Low Risk  (10/24/2022)   Overall Financial Resource Strain (CARDIA)    Difficulty of Paying Living Expenses: Not hard at all  Food Insecurity: No Food Insecurity (10/24/2022)   Hunger Vital Sign    Worried About Running Out of Food in the Last Year: Never true    Ran Out of Food in the Last Year: Never true  Transportation Needs: No Transportation Needs (10/24/2022)   PRAPARE - Administrator, Civil Service (Medical): No    Lack of Transportation (Non-Medical): No  Physical Activity: Sufficiently Active (10/24/2022)   Exercise Vital Sign    Days of Exercise per Week: 7 days    Minutes of Exercise per Session: 40 min  Stress: No Stress Concern Present (10/24/2022)   [employer] of Occupational Health - Occupational Stress Questionnaire    Feeling of Stress : Not at all  Social Connections: Socially Integrated (10/24/2022)   Social Connection and Isolation Panel [NHANES]    Frequency of Communication with Friends and Family: More than three times a week    Frequency of Social Gatherings with Friends and Family: More than three times a week    Attends Religious Services: More than 4 times per year    Active Member of [employer] or Organizations: No     Attends Engineer, structural: More than 4 times per year    Marital Status: Married    Tobacco Counseling Counseling given: Not Answered   Clinical Intake:  Pre-visit preparation completed: Yes  Pain : No/denies pain Pain Score: 0-No pain     BMI - recorded: 36.92 Nutritional Status: BMI > 30  Obese  Nutritional Risks: None Diabetes: No  How often do you need to have someone help you when you read instructions, pamphlets, or other written materials from your doctor or pharmacy?: 1 - Never What is the last grade level you completed in school?: MBA  Interpreter Needed?: No  Information entered by :: Candie Gintz N. Dagan Heinz, LPN.   Activities of Daily Living    10/24/2022   10:38 AM 10/27/2021   11:16 AM  In your present state of health, do you have any difficulty performing the following activities:  Hearing? 0 0  Vision? 0 0  Difficulty concentrating or making decisions? 0 0  Walking or climbing stairs? 0 0  Dressing or bathing? 0 0  Doing errands, shopping? 0 0  Preparing Food and eating ? N N  Using the Toilet? N N  In the past six months, have you accidently leaked urine? N N  Do you have problems with loss of bowel control? N N  Managing your Medications? N N  Managing your Finances? N N  Housekeeping or managing your Housekeeping? N N    Patient Care Team: Pincus Sanes, MD as PCP - General (Internal Medicine) Wendall Stade, MD as PCP - Cardiology (Cardiology) Mia Creek, MD as Consulting Physician (Ophthalmology) [facility], [place] as Consulting Physician (Optometry)  Indicate any recent Medical Services you may have received from other than [facility] providers in the past year (date may be approximate).     Assessment:   This is a routine wellness examination for [place].  Hearing/Vision screen Hearing Screening - Comments:: Patient denied any hearing difficulty.   No hearing aids.  Vision Screening - Comments:: Patient does  wear corrective lenses/contacts.  Eye exam done by: [facility]   Dietary issues and exercise activities discussed:     Goals Addressed             This Visit's Progress    My goal for 2024 is to lose 10 pounds, maintain my health and stay active.        Depression Screen    10/24/2022   10:36 AM 08/21/2022    9:32 AM 10/27/2021   11:08 AM 08/04/2021   10:44 AM 10/28/2020   10:07 AM 04/20/2020   10:02 AM 10/29/2018    9:47 AM  PHQ 2/9 Scores  PHQ - 2 Score 0 0 0 0 0 0 0  PHQ- 9 Score 0 0         Fall Risk    10/24/2022   10:36 AM 08/21/2022    9:32 AM 10/27/2021   11:06 AM 08/04/2021   10:44 AM 10/28/2020   10:07 AM  Fall Risk   Falls in the past year? 0 0 0 0 0  Number falls in past yr: 0 0 0 0 0  Injury with Fall? 0 0 0 0 0  Risk for fall due to : No Fall Risks No Fall Risks No Fall Risks No Fall Risks   Follow up Falls prevention discussed  Falls evaluation completed Falls evaluation completed     MEDICARE RISK AT HOME:  Medicare Risk at Home - 10/24/22 1046     Any stairs in or around the home? Yes    If so, are there any without handrails? No    Home free of loose throw rugs in walkways, pet beds, electrical cords, etc? Yes    Adequate lighting in your home to reduce risk of falls? Yes    Life  alert? No    Use of a cane, walker or w/c? No    Grab bars in the bathroom? No    Shower chair or bench in shower? No    Elevated toilet seat or a handicapped toilet? No             TIMED UP AND GO:  Was the test performed?  No    Cognitive Function:        10/24/2022   10:38 AM 10/27/2021   11:17 AM  6CIT Screen  What Year? 0 points 0 points  What month? 0 points 0 points  What time? 0 points 0 points  Count back from 20 0 points 0 points  Months in reverse 0 points 0 points  Repeat phrase 0 points 0 points  Total Score 0 points 0 points    Immunizations Immunization History  Administered Date(s) Administered   Fluad Quad(high Dose 65+)  01/01/2019, 04/20/2020   Influenza, High Dose Seasonal PF 05/22/2013, 01/16/2017, 02/27/2018   Influenza, Seasonal, Injecte, Preservative Fre 05/20/2012   Influenza,inj,Quad PF,6+ Mos 03/02/2014, 05/19/2015   Influenza-Unspecified 01/16/2017, 03/28/2021   PFIZER(Purple Top)SARS-COV-2 Vaccination 05/27/2019, 06/17/2019, 02/08/2020   Pneumococcal Conjugate-13 06/07/2015   Pneumococcal Polysaccharide-23 04/13/2014   Respiratory Syncytial Virus Vaccine,Recomb Aduvanted(Arexvy) 02/28/2022   Td 05/11/2010   Tdap 08/05/2012   Zoster Recombinat (Shingrix) 01/16/2017, 07/01/2017   Zoster, Live 12/23/2007    TDAP status: Due, Education has been provided regarding the importance of this vaccine. Advised may receive this vaccine at local pharmacy or Health Dept. Aware to provide a copy of the vaccination record if obtained from local pharmacy or Health Dept. Verbalized acceptance and understanding.  Flu Vaccine status: Due, Education has been provided regarding the importance of this vaccine. Advised may receive this vaccine at local pharmacy or Health Dept. Aware to provide a copy of the vaccination record if obtained from local pharmacy or Health Dept. Verbalized acceptance and understanding.  Pneumococcal vaccine status: Up to date  Covid-19 vaccine status: Completed vaccines  Qualifies for Shingles Vaccine? Yes   Zostavax completed Yes   Shingrix Completed?: Yes  Screening Tests Health Maintenance  Topic Date Due   COVID-19 Vaccine (4 - 2023-24 season) 01/05/2022   DTaP/Tdap/Td (3 - Td or Tdap) 08/06/2022   Colonoscopy  02/03/2023   INFLUENZA VACCINE  12/06/2022   Medicare Annual Wellness (AWV)  10/24/2023   Pneumonia Vaccine 75+ Years old  Completed   Hepatitis C Screening  Completed   Zoster Vaccines- Shingrix  Completed   HPV VACCINES  Aged Out    Health Maintenance  Health Maintenance Due  Topic Date Due   COVID-19 Vaccine (4 - 2023-24 season) 01/05/2022   DTaP/Tdap/Td (3 -  Td or Tdap) 08/06/2022   Colonoscopy  02/03/2023    Colorectal cancer screening: Referral to GI placed 10/24/2022. Pt aware the office will call re: appt.  Lung Cancer Screening: (Low Dose CT Chest recommended if Age 33-80 years, 20 pack-year currently smoking OR have quit w/in 15years.) does not qualify.   Lung Cancer Screening Referral: no  Additional Screening:  Hepatitis C Screening: does qualify; Completed 06/10/2015  Vision Screening: Recommended annual ophthalmology exams for early detection of glaucoma and other disorders of the eye. Is the patient up to date with their annual eye exam?  Yes  Who is the provider or what is the name of the office in which the patient attends annual eye exams? [facility] If pt is not established with a provider,  would they like to be referred to a provider to establish care? No .   Dental Screening: Recommended annual dental exams for proper oral hygiene  Diabetic Foot Exam: Diabetic Foot Exam: Completed 10/29/2018; Overdue.  Community Resource Referral / Chronic Care Management: CRR required this visit?  No   CCM required this visit?  No     Plan:     I have personally reviewed and noted the following in the patient's chart:   Medical and social history Use of alcohol, tobacco or illicit drugs  Current medications and supplements including opioid prescriptions. Patient is not currently taking opioid prescriptions. Functional ability and status Nutritional status Physical activity Advanced directives List of other physicians Hospitalizations, surgeries, and ER visits in previous 12 months Vitals Screenings to include cognitive, depression, and falls Referrals and appointments  In addition, I have reviewed and discussed with patient certain preventive protocols, quality metrics, and best practice recommendations. A written personalized care plan for preventive services as well as general preventive health recommendations were  provided to patient.     Mickeal Needy, LPN   08/13/8117   After Visit Summary: (Mail) Due to this being a telephonic visit, the after visit summary with patients personalized plan was offered to patient via mail   Nurse Notes: Normal cognitive status assessed by direct observation via telephone conversation by this Nurse Health Advisor. No abnormalities found.

## 2022-10-24 NOTE — Patient Instructions (Addendum)
[name] , Thank you for taking time to come for your Medicare Wellness Visit. I appreciate your ongoing commitment to your health goals. Please review the following plan we discussed and let me know if I can assist you in the future.   These are the goals we discussed:  Goals      My goal for 2024 is to lose 10 pounds, maintain my health and stay active.        This is a list of the screening recommended for you and due dates:  Health Maintenance  Topic Date Due   COVID-19 Vaccine (4 - 2023-24 season) 01/05/2022   DTaP/Tdap/Td vaccine (3 - Td or Tdap) 08/06/2022   Colon Cancer Screening  02/03/2023   Flu Shot  12/06/2022   Medicare Annual Wellness Visit  10/24/2023   Pneumonia Vaccine  Completed   Hepatitis C Screening  Completed   Zoster (Shingles) Vaccine  Completed   HPV Vaccine  Aged Out    Advanced directives: Yes  Conditions/risks identified: Yes  Next appointment: It was nice speaking with you today!  Please follow up in one year for your annual wellness visit via telephone call with Nurse Percell Miller on 10/31/2023 at 10:30 a.m.  If you need to cancel or reschedule please call [phone].   Preventive Care 32 Years and Older, Male  Preventive care refers to lifestyle choices and visits with your health care provider that can promote health and wellness. What does preventive care include? A yearly physical exam. This is also called an annual well check. Dental exams once or twice a year. Routine eye exams. Ask your health care provider how often you should have your eyes checked. Personal lifestyle choices, including: Daily care of your teeth and gums. Regular physical activity. Eating a healthy diet. Avoiding tobacco and drug use. Limiting alcohol use. Practicing safe sex. Taking low doses of aspirin every day. Taking vitamin and mineral supplements as recommended by your health care provider. What happens during an annual well check? The services and  screenings done by your health care provider during your annual well check will depend on your age, overall health, lifestyle risk factors, and family history of disease. Counseling  Your health care provider may ask you questions about your: Alcohol use. Tobacco use. Drug use. Emotional well-being. Home and relationship well-being. Sexual activity. Eating habits. History of falls. Memory and ability to understand (cognition). Work and work Astronomer. Screening  You may have the following tests or measurements: Height, weight, and BMI. Blood pressure. Lipid and cholesterol levels. These may be checked every 5 years, or more frequently if you are over 38 years old. Skin check. Lung cancer screening. You may have this screening every year starting at age 14 if you have a 30-pack-year history of smoking and currently smoke or have quit within the past 15 years. Fecal occult blood test (FOBT) of the stool. You may have this test every year starting at age 73. Flexible sigmoidoscopy or colonoscopy. You may have a sigmoidoscopy every 5 years or a colonoscopy every 10 years starting at age 62. Prostate cancer screening. Recommendations will vary depending on your family history and other risks. Hepatitis C blood test. Hepatitis B blood test. Sexually transmitted disease (STD) testing. Diabetes screening. This is done by checking your blood sugar (glucose) after you have not eaten for a while (fasting). You may have this done every 1-3 years. Abdominal aortic aneurysm (AAA) screening. You may need this if you are a current  or former smoker. Osteoporosis. You may be screened starting at age 21 if you are at high risk. Talk with your health care provider about your test results, treatment options, and if necessary, the need for more tests. Vaccines  Your health care provider may recommend certain vaccines, such as: Influenza vaccine. This is recommended every year. Tetanus, diphtheria, and  acellular pertussis (Tdap, Td) vaccine. You may need a Td booster every 10 years. Zoster vaccine. You may need this after age 19. Pneumococcal 13-valent conjugate (PCV13) vaccine. One dose is recommended after age 28. Pneumococcal polysaccharide (PPSV23) vaccine. One dose is recommended after age 62. Talk to your health care provider about which screenings and vaccines you need and how often you need them. This information is not intended to replace advice given to you by your health care provider. Make sure you discuss any questions you have with your health care provider. Document Released: 05/20/2015 Document Revised: 01/11/2016 Document Reviewed: 02/22/2015 Elsevier Interactive Patient Education  2017 [place] Prevention in the Home Falls can cause injuries. They can happen to people of all ages. There are many things you can do to make your home safe and to help prevent falls. What can I do on the outside of my home? Regularly fix the edges of walkways and driveways and fix any cracks. Remove anything that might make you trip as you walk through a door, such as a raised step or threshold. Trim any bushes or trees on the path to your home. Use bright outdoor lighting. Clear any walking paths of anything that might make someone trip, such as rocks or tools. Regularly check to see if handrails are loose or broken. Make sure that both sides of any steps have handrails. Any raised decks and porches should have guardrails on the edges. Have any leaves, snow, or ice cleared regularly. Use sand or salt on walking paths during winter. Clean up any spills in your garage right away. This includes oil or grease spills. What can I do in the bathroom? Use night lights. Install grab bars by the toilet and in the tub and shower. Do not use towel bars as grab bars. Use non-skid mats or decals in the tub or shower. If you need to sit down in the shower, use a plastic, non-slip stool. Keep  the floor dry. Clean up any water that spills on the floor as soon as it happens. Remove soap buildup in the tub or shower regularly. Attach bath mats securely with double-sided non-slip rug tape. Do not have throw rugs and other things on the floor that can make you trip. What can I do in the bedroom? Use night lights. Make sure that you have a light by your bed that is easy to reach. Do not use any sheets or blankets that are too big for your bed. They should not hang down onto the floor. Have a firm chair that has side arms. You can use this for support while you get dressed. Do not have throw rugs and other things on the floor that can make you trip. What can I do in the kitchen? Clean up any spills right away. Avoid walking on wet floors. Keep items that you use a lot in easy-to-reach places. If you need to reach something above you, use a strong step stool that has a grab bar. Keep electrical cords out of the way. Do not use floor polish or wax that makes floors slippery. If you must use wax,  use non-skid floor wax. Do not have throw rugs and other things on the floor that can make you trip. What can I do with my stairs? Do not leave any items on the stairs. Make sure that there are handrails on both sides of the stairs and use them. Fix handrails that are broken or loose. Make sure that handrails are as long as the stairways. Check any carpeting to make sure that it is firmly attached to the stairs. Fix any carpet that is loose or worn. Avoid having throw rugs at the top or bottom of the stairs. If you do have throw rugs, attach them to the floor with carpet tape. Make sure that you have a light switch at the top of the stairs and the bottom of the stairs. If you do not have them, ask someone to add them for you. What else can I do to help prevent falls? Wear shoes that: Do not have high heels. Have rubber bottoms. Are comfortable and fit you well. Are closed at the toe. Do not  wear sandals. If you use a stepladder: Make sure that it is fully opened. Do not climb a closed stepladder. Make sure that both sides of the stepladder are locked into place. Ask someone to hold it for you, if possible. Clearly mark and make sure that you can see: Any grab bars or handrails. First and last steps. Where the edge of each step is. Use tools that help you move around (mobility aids) if they are needed. These include: Canes. Walkers. Scooters. Crutches. Turn on the lights when you go into a dark area. Replace any light bulbs as soon as they burn out. Set up your furniture so you have a clear path. Avoid moving your furniture around. If any of your floors are uneven, fix them. If there are any pets around you, be aware of where they are. Review your medicines with your doctor. Some medicines can make you feel dizzy. This can increase your chance of falling. Ask your doctor what other things that you can do to help prevent falls. This information is not intended to replace advice given to you by your health care provider. Make sure you discuss any questions you have with your health care provider. Document Released: 02/17/2009 Document Revised: 09/29/2015 Document Reviewed: 05/28/2014 Elsevier Interactive Patient Education  2017 [employer].

## 2022-10-29 DIAGNOSIS — Z0100 Encounter for examination of eyes and vision without abnormal findings: Secondary | ICD-10-CM | POA: Diagnosis not present

## 2022-10-30 ENCOUNTER — Ambulatory Visit (HOSPITAL_BASED_OUTPATIENT_CLINIC_OR_DEPARTMENT_OTHER): Payer: Medicare HMO | Admitting: Pulmonary Disease

## 2022-10-30 ENCOUNTER — Encounter (HOSPITAL_BASED_OUTPATIENT_CLINIC_OR_DEPARTMENT_OTHER): Payer: Self-pay | Admitting: Pulmonary Disease

## 2022-10-30 VITALS — BP 140/84 | HR 71 | Temp 98.0°F | Ht 69.0 in | Wt 257.6 lb

## 2022-10-30 DIAGNOSIS — G4733 Obstructive sleep apnea (adult) (pediatric): Secondary | ICD-10-CM | POA: Diagnosis not present

## 2022-10-30 NOTE — Progress Notes (Signed)
[facility]  Chief Complaint  Patient presents with   Follow-up    Follow up. Patient needs a new cpap machine.     Constitutional:  BP (!) 140/84 (BP Location: Right Arm, Patient Position: Sitting, Cuff Size: Normal)   Pulse 71   Temp 98 F (36.7 C) (Oral)   Ht 5\' 9"  (1.753 m)   Wt 257 lb 9.6 oz (116.8 kg)   SpO2 96%   BMI 38.04 kg/m   Past Medical History:  BPH, Diverticulosis, Basal cell carcinoma of face, Gilbert syndrome, HLD, HTN, Colon polyp  Past Surgical History:  His  has a past surgical history that includes Tonsillectomy; Knee arthroscopy; Colonoscopy w/ polypectomy (2004 & 2009); Wisdom tooth extraction; and Colonoscopy (2014).  Brief Summary:  Edwin [name] is a 77 y.o. male with obstructive sleep apnea.      Subjective:   I last saw him in 2021.  He was to get a new CPAP machine then, but due to supply chain issues he wasn't able to get the new device.  His current CPAP now shows motor life exceeded.  He uses CPAP nightly.  No issues with mask fit.  Goes to bed around 11 and gets up around 7 am.  Not using anything to help sleep or stay awake.  Sleeps through the night.  Sometimes naps in the afternoon for about 30 minutes.  Physical Exam:   Appearance - well kempt   ENMT - no sinus tenderness, no oral exudate, no LAN, Mallampati 3 airway, no stridor  Respiratory - equal breath sounds bilaterally, no wheezing or rales  CV - s1s2 regular rate and rhythm, no murmurs  Ext - no clubbing, no edema  Skin - no rashes  Psych - normal mood and affect    Sleep Tests:  Auto CPAP 12/08/19 to 03/06/20 >> used on 90 of 90 nights with average 8 hrs 24 min.  Average AHI 1.5 with median CPAP 9 and 95 th percentile CPAP 12 cm H2O.  Air leak.  Social History:  He  reports that he has never smoked. He has never used smokeless tobacco. He reports current alcohol use. He reports that he does not use drugs.  Family  History:  His family history includes Appendicitis in his brother and sister; Asthma in his brother; Cancer in his brother; Diabetes in his father; Diverticulosis in his mother; Heart attack (age of onset: 9) in his maternal grandfather; Heart attack (age of onset: 41) in his maternal grandmother; Heart disease (age of onset: 82) in his father; Hypertension in his father and sister; Parkinsonism in his maternal grandfather and mother; Stroke in his mother; Stroke (age of onset: 33) in his paternal grandmother.     Assessment/Plan:   Obstructive sleep apnea. - he is compliant with CPAP and reports benefit from therapy - he gets supplies from [facility] - his current machine is more than 75 yrs old - will arrange for a new Resmed 11 auto CPAP with pressure range of 5 to 11 cm H2O  Obesity. - he is aware of how his weight can impact his health, especially in relation to his sleep apnea  Time Spent Involved in Patient Care on Day of Examination:  15 minutes  Follow up:   Patient Instructions  Will arrange for a new Resmed auto CPAP machine  Follow up in 4 months  Medication List:   Allergies as of 10/30/2022   No Known Allergies  Medication List        Accurate as of October 30, 2022 11:15 AM. If you have any questions, ask your nurse or doctor.          STOP taking these medications    triamcinolone cream 0.1 % Commonly known as: KENALOG Stopped by: Coralyn Helling, MD       TAKE these medications    albuterol 108 (90 Base) MCG/ACT inhaler Commonly known as: VENTOLIN HFA Inhale 2 puffs into the lungs every 6 (six) hours as needed (wheezing or cough).   aspirin 81 MG tablet Take 81 mg by mouth daily.   atorvastatin 10 MG tablet Commonly known as: LIPITOR TAKE 1 TABLET EVERY OTHER DAY   azelastine 0.1 % nasal spray Commonly known as: ASTELIN Place into the nose.   metoprolol succinate 50 MG 24 hr tablet Commonly known as: TOPROL-XL Take with or immediately  following a meal.   multivitamin tablet Take 1 tablet by mouth daily.   naproxen sodium 220 MG tablet Commonly known as: ALEVE Take 220 mg by mouth daily as needed.   nitroGLYCERIN 0.4 MG SL tablet Commonly known as: NITROSTAT Place 1 tablet (0.4 mg total) under the tongue every 5 (five) minutes as needed for chest pain.   QC TUMERIC COMPLEX PO Take by mouth daily.   VITAMIN B-12 PO Take by mouth as directed.   Vitamin D3 25 MCG (1000 UT) Caps Take 1 capsule by mouth daily.        Signature:  Coralyn Helling, MD [facility] Pager - [phone] 10/30/2022, 11:15 AM

## 2022-10-30 NOTE — Patient Instructions (Signed)
Will arrange for a new Resmed auto CPAP machine  Follow up in 4 months 

## 2022-11-01 DIAGNOSIS — R972 Elevated prostate specific antigen [PSA]: Secondary | ICD-10-CM | POA: Diagnosis not present

## 2022-11-06 DIAGNOSIS — R351 Nocturia: Secondary | ICD-10-CM | POA: Diagnosis not present

## 2022-11-06 DIAGNOSIS — N401 Enlarged prostate with lower urinary tract symptoms: Secondary | ICD-10-CM | POA: Diagnosis not present

## 2022-11-06 DIAGNOSIS — N5201 Erectile dysfunction due to arterial insufficiency: Secondary | ICD-10-CM | POA: Diagnosis not present

## 2022-11-06 DIAGNOSIS — R972 Elevated prostate specific antigen [PSA]: Secondary | ICD-10-CM | POA: Diagnosis not present

## 2022-11-15 ENCOUNTER — Encounter: Payer: Self-pay | Admitting: Internal Medicine

## 2022-11-17 ENCOUNTER — Other Ambulatory Visit: Payer: Self-pay | Admitting: Internal Medicine

## 2022-11-20 DIAGNOSIS — Z85828 Personal history of other malignant neoplasm of skin: Secondary | ICD-10-CM | POA: Diagnosis not present

## 2022-11-20 DIAGNOSIS — L570 Actinic keratosis: Secondary | ICD-10-CM | POA: Diagnosis not present

## 2022-11-20 DIAGNOSIS — L821 Other seborrheic keratosis: Secondary | ICD-10-CM | POA: Diagnosis not present

## 2022-11-20 DIAGNOSIS — D3612 Benign neoplasm of peripheral nerves and autonomic nervous system, upper limb, including shoulder: Secondary | ICD-10-CM | POA: Diagnosis not present

## 2022-11-20 DIAGNOSIS — D225 Melanocytic nevi of trunk: Secondary | ICD-10-CM | POA: Diagnosis not present

## 2022-11-20 DIAGNOSIS — D485 Neoplasm of uncertain behavior of skin: Secondary | ICD-10-CM | POA: Diagnosis not present

## 2022-11-20 DIAGNOSIS — D1809 Hemangioma of other sites: Secondary | ICD-10-CM | POA: Diagnosis not present

## 2022-11-20 DIAGNOSIS — L820 Inflamed seborrheic keratosis: Secondary | ICD-10-CM | POA: Diagnosis not present

## 2022-11-20 DIAGNOSIS — B351 Tinea unguium: Secondary | ICD-10-CM | POA: Diagnosis not present

## 2022-11-20 DIAGNOSIS — L739 Follicular disorder, unspecified: Secondary | ICD-10-CM | POA: Diagnosis not present

## 2022-11-20 DIAGNOSIS — D2239 Melanocytic nevi of other parts of face: Secondary | ICD-10-CM | POA: Diagnosis not present

## 2022-11-22 ENCOUNTER — Encounter: Payer: Self-pay | Admitting: Internal Medicine

## 2022-12-20 ENCOUNTER — Encounter (INDEPENDENT_AMBULATORY_CARE_PROVIDER_SITE_OTHER): Payer: Self-pay

## 2022-12-27 ENCOUNTER — Telehealth: Payer: Self-pay

## 2022-12-27 NOTE — Telephone Encounter (Signed)
Patient NO SHOWED PV appointment.  Called mobile number and home number and left messages.  Last message indicated that he needed to call back before 5pm today and reschedule the PV or his colonoscopy would be cancelled.

## 2022-12-27 NOTE — Telephone Encounter (Signed)
Called mobile phone and Home phone and left message

## 2022-12-27 NOTE — Telephone Encounter (Signed)
NO SHOW letter sent. PV and colonoscopy cancelled.

## 2022-12-28 ENCOUNTER — Encounter: Payer: Self-pay | Admitting: Internal Medicine

## 2023-01-15 ENCOUNTER — Encounter: Payer: Medicare HMO | Admitting: Internal Medicine

## 2023-01-25 ENCOUNTER — Ambulatory Visit: Payer: Medicare HMO | Admitting: Internal Medicine

## 2023-01-27 ENCOUNTER — Encounter: Payer: Self-pay | Admitting: Internal Medicine

## 2023-01-27 NOTE — Progress Notes (Unsigned)
Subjective:    Patient ID: Edwin [name], male    DOB: 1945-07-10, 77 y.o.   MRN: [id number]     HPI Edwin [name] is here for follow up of his chronic medical problems.  He has a laceration/mild cut on the left side of his face - he tripped and fell two days ago.  He denies hitting his head hard.  Has scrap there.  He denies headaches.  No LOC.  Denies other injuries.  His wife tells him he is shuffling his feet.  He thinks it is related to what shoes he wears.  He denies tremor.  No lower back issues.    Walking regularly.   He has improved his diet.    Occ feeling of tightness or swelling in toes.   No pain.    Medications and allergies reviewed with patient and updated if appropriate.  Current Outpatient Medications on File Prior to Visit  Medication Sig Dispense Refill   albuterol (VENTOLIN HFA) 108 (90 Base) MCG/ACT inhaler Inhale 2 puffs into the lungs every 6 (six) hours as needed (wheezing or cough). 8 g 0   aspirin 81 MG tablet Take 81 mg by mouth daily.     atorvastatin (LIPITOR) 10 MG tablet TAKE 1 TABLET EVERY OTHER DAY 45 tablet 3   Cholecalciferol (VITAMIN D3) 1000 UNITS CAPS Take 1 capsule by mouth daily.     Cyanocobalamin (VITAMIN B-12 PO) Take by mouth as directed.     metoprolol succinate (TOPROL-XL) 50 MG 24 hr tablet TAKE 1 TABLET ONCE DAILY. TAKE WITH OR IMMEDIATELY FOLLOWING A MEAL. 90 tablet 3   Multiple Vitamin (MULTIVITAMIN) tablet Take 1 tablet by mouth daily.     naproxen sodium (ALEVE) 220 MG tablet Take 220 mg by mouth daily as needed.     nitroGLYCERIN (NITROSTAT) 0.4 MG SL tablet Place 1 tablet (0.4 mg total) under the tongue every 5 (five) minutes as needed for chest pain. 25 tablet 3   Turmeric (QC TUMERIC COMPLEX PO) Take by mouth daily.     azelastine (ASTELIN) 0.1 % nasal spray Place into the nose.     No current facility-administered medications on file prior to visit.     Review of Systems  Constitutional:  Negative for fever.   Respiratory:  Negative for cough, shortness of breath and wheezing.   Cardiovascular:  Negative for chest pain, palpitations and leg swelling.  Musculoskeletal:  Positive for arthralgias.  Neurological:  Negative for dizziness, light-headedness and headaches.  Psychiatric/Behavioral:  Negative for sleep disturbance.        Objective:   Vitals:   01/28/23 1016  BP: 130/80  Pulse: 80  Temp: 98 F (36.7 C)  SpO2: 96%   BP Readings from Last 3 Encounters:  01/28/23 130/80  10/30/22 (!) 140/84  08/21/22 130/80   Wt Readings from Last 3 Encounters:  01/28/23 255 lb (115.7 kg)  10/30/22 257 lb 9.6 oz (116.8 kg)  10/24/22 250 lb (113.4 kg)   Body mass index is 37.66 kg/m.    Physical Exam Constitutional:      General: He is not in acute distress.    Appearance: Normal appearance. He is not ill-appearing.  HENT:     Head: Normocephalic and atraumatic.  Eyes:     Conjunctiva/sclera: Conjunctivae normal.  Cardiovascular:     Rate and Rhythm: Normal rate and regular rhythm.     Heart sounds: Normal heart sounds.  Pulmonary:     Effort: Pulmonary  effort is normal. No respiratory distress.     Breath sounds: Normal breath sounds. No wheezing or rales.  Musculoskeletal:     Right lower leg: No edema.     Left lower leg: No edema.  Skin:    General: Skin is warm and dry.     Findings: No rash.     Comments: Abrasion on left side of face from lateral eyebrow to upper cheek, no bleeding, discharge  Neurological:     Mental Status: He is alert. Mental status is at baseline.  Psychiatric:        Mood and Affect: Mood normal.        Lab Results  Component Value Date   WBC 4.8 08/22/2022   HGB 15.2 08/22/2022   HCT 43.7 08/22/2022   PLT 165.0 08/22/2022   GLUCOSE 219 (H) 08/22/2022   CHOL 125 08/22/2022   TRIG 313.0 (H) 08/22/2022   HDL 32.70 (L) 08/22/2022   LDLDIRECT 43.0 08/22/2022   LDLCALC 66 10/26/2021   ALT 16 08/22/2022   AST 15 08/22/2022   NA 138  08/22/2022   K 4.2 08/22/2022   CL 102 08/22/2022   CREATININE 0.98 08/22/2022   BUN 24 (H) 08/22/2022   CO2 26 08/22/2022   TSH 4.56 08/22/2022   PSA 4.13 (H) 10/29/2019   HGBA1C 8.6 (H) 08/22/2022   MICROALBUR 2.4 (H) 08/22/2022     Assessment & Plan:    Td, flu vaccines today  See Problem List for Assessment and Plan of chronic medical problems.

## 2023-01-27 NOTE — Patient Instructions (Addendum)
    Tetanus and flu vaccines given today.      Blood work was ordered.       Medications changes include :   none    Return in about 6 months (around 07/28/2023) for Physical Exam.

## 2023-01-28 ENCOUNTER — Ambulatory Visit (INDEPENDENT_AMBULATORY_CARE_PROVIDER_SITE_OTHER): Payer: Medicare HMO | Admitting: Internal Medicine

## 2023-01-28 VITALS — BP 130/80 | HR 80 | Temp 98.0°F | Ht 69.0 in | Wt 255.0 lb

## 2023-01-28 DIAGNOSIS — E038 Other specified hypothyroidism: Secondary | ICD-10-CM | POA: Diagnosis not present

## 2023-01-28 DIAGNOSIS — E1169 Type 2 diabetes mellitus with other specified complication: Secondary | ICD-10-CM | POA: Diagnosis not present

## 2023-01-28 DIAGNOSIS — Z23 Encounter for immunization: Secondary | ICD-10-CM

## 2023-01-28 DIAGNOSIS — Z7984 Long term (current) use of oral hypoglycemic drugs: Secondary | ICD-10-CM

## 2023-01-28 DIAGNOSIS — I10 Essential (primary) hypertension: Secondary | ICD-10-CM | POA: Diagnosis not present

## 2023-01-28 DIAGNOSIS — I2510 Atherosclerotic heart disease of native coronary artery without angina pectoris: Secondary | ICD-10-CM

## 2023-01-28 DIAGNOSIS — S00212A Abrasion of left eyelid and periocular area, initial encounter: Secondary | ICD-10-CM | POA: Diagnosis not present

## 2023-01-28 DIAGNOSIS — E782 Mixed hyperlipidemia: Secondary | ICD-10-CM

## 2023-01-28 NOTE — Assessment & Plan Note (Addendum)
Chronic   Lab Results  Component Value Date   HGBA1C 8.6 (H) 08/22/2022   Sugars not ideally controlled Check A1c Continue lifestyle controlled-depending on A1c today will need medication-would recommend Jardiance Stressed regular exercise, diabetic diet Encouraged weight loss

## 2023-01-28 NOTE — Assessment & Plan Note (Signed)
Chronic Regular exercise and healthy diet encouraged Lab Results  Component Value Date   LDLCALC 66 10/26/2021   LDL at goal Check lipid panel, CMP Continue atorvastatin 10 mg daily

## 2023-01-28 NOTE — Assessment & Plan Note (Signed)
Chronic BP controlled Continue  Metoprolol xl 50 mg daily  Check CMP, CBC

## 2023-01-28 NOTE — Assessment & Plan Note (Addendum)
Chronic BMI > 35 with comorbidities of hyperlipidemia, Hypertension and CAD, diabetes  Encouraged weight loss Continue walking regularly

## 2023-01-28 NOTE — Assessment & Plan Note (Signed)
Chronic-intermittent elevation of TSH  last TSH in normal range Clinically euthyroid

## 2023-01-28 NOTE — Assessment & Plan Note (Addendum)
Chronic No symptoms consistent with angina Continue aspirin 81 mg daily, atorvastatin 10 mg daily Blood pressure controlled Sugars controlled, healthy diet , regular exercise encouraged Lipid panel, CMP, CBC

## 2023-01-28 NOTE — Assessment & Plan Note (Signed)
Acute Fell 2 days ago-tripped and has an abrasion on the left side of his face-left eyebrow to upper cheek Tenderness not up-to-date Td administered today Continue local wound care at home-no evidence of infection

## 2023-01-29 ENCOUNTER — Other Ambulatory Visit (INDEPENDENT_AMBULATORY_CARE_PROVIDER_SITE_OTHER): Payer: Medicare HMO

## 2023-01-29 DIAGNOSIS — I10 Essential (primary) hypertension: Secondary | ICD-10-CM

## 2023-01-29 DIAGNOSIS — E1169 Type 2 diabetes mellitus with other specified complication: Secondary | ICD-10-CM | POA: Diagnosis not present

## 2023-01-29 DIAGNOSIS — E782 Mixed hyperlipidemia: Secondary | ICD-10-CM | POA: Diagnosis not present

## 2023-01-29 LAB — CBC WITH DIFFERENTIAL/PLATELET
Basophils Absolute: 0.1 10*3/uL (ref 0.0–0.1)
Basophils Relative: 1.2 % (ref 0.0–3.0)
Eosinophils Absolute: 0.2 10*3/uL (ref 0.0–0.7)
Eosinophils Relative: 3.4 % (ref 0.0–5.0)
HCT: 42.9 % (ref 39.0–52.0)
Hemoglobin: 14.7 g/dL (ref 13.0–17.0)
Lymphocytes Relative: 23.8 % (ref 12.0–46.0)
Lymphs Abs: 1.4 10*3/uL (ref 0.7–4.0)
MCHC: 34.3 g/dL (ref 30.0–36.0)
MCV: 95.9 fl (ref 78.0–100.0)
Monocytes Absolute: 0.5 10*3/uL (ref 0.1–1.0)
Monocytes Relative: 9.1 % (ref 3.0–12.0)
Neutro Abs: 3.6 10*3/uL (ref 1.4–7.7)
Neutrophils Relative %: 62.5 % (ref 43.0–77.0)
Platelets: 189 10*3/uL (ref 150.0–400.0)
RBC: 4.47 Mil/uL (ref 4.22–5.81)
RDW: 12.6 % (ref 11.5–15.5)
WBC: 5.7 10*3/uL (ref 4.0–10.5)

## 2023-01-29 LAB — COMPREHENSIVE METABOLIC PANEL WITH GFR
ALT: 16 U/L (ref 0–53)
AST: 12 U/L (ref 0–37)
Albumin: 3.8 g/dL (ref 3.5–5.2)
Alkaline Phosphatase: 39 U/L (ref 39–117)
BUN: 14 mg/dL (ref 6–23)
CO2: 25 meq/L (ref 19–32)
Calcium: 8.8 mg/dL (ref 8.4–10.5)
Chloride: 105 meq/L (ref 96–112)
Creatinine, Ser: 0.91 mg/dL (ref 0.40–1.50)
GFR: 82.68 mL/min
Glucose, Bld: 298 mg/dL — ABNORMAL HIGH (ref 70–99)
Potassium: 4.1 meq/L (ref 3.5–5.1)
Sodium: 138 meq/L (ref 135–145)
Total Bilirubin: 0.6 mg/dL (ref 0.2–1.2)
Total Protein: 6.4 g/dL (ref 6.0–8.3)

## 2023-01-29 LAB — LIPID PANEL
Cholesterol: 133 mg/dL (ref 0–200)
HDL: 34.8 mg/dL — ABNORMAL LOW (ref >39.00)
LDL Cholesterol: 58 mg/dL (ref 0–99)
NonHDL: 98.57
Total CHOL/HDL Ratio: 4
Triglycerides: 203 mg/dL — ABNORMAL HIGH (ref 0.0–149.0)
VLDL: 40.6 mg/dL — ABNORMAL HIGH (ref 0.0–40.0)

## 2023-01-29 LAB — HEMOGLOBIN A1C: Hgb A1c MFr Bld: 10.1 % — ABNORMAL HIGH (ref 4.6–6.5)

## 2023-01-31 MED ORDER — EMPAGLIFLOZIN 10 MG PO TABS: 10.0000 mg | ORAL_TABLET | Freq: Every day | ORAL | 5 refills | Status: DC

## 2023-02-07 ENCOUNTER — Ambulatory Visit (AMBULATORY_SURGERY_CENTER): Payer: Medicare HMO

## 2023-02-07 VITALS — Ht 69.0 in | Wt 241.0 lb

## 2023-02-07 DIAGNOSIS — Z1211 Encounter for screening for malignant neoplasm of colon: Secondary | ICD-10-CM

## 2023-02-07 MED ORDER — NA SULFATE-K SULFATE-MG SULF 17.5-3.13-1.6 GM/177ML PO SOLN
1.0000 | Freq: Once | ORAL | 0 refills | Status: AC
Start: 2023-02-07 — End: 2023-02-07

## 2023-02-07 NOTE — Progress Notes (Signed)
Pt's name and DOB verified at the beginning of the pre-visit wit 2 identifiers  Pt denies any difficulty with ambulating,sitting, laying down or rolling side to side  Gave both [facility] main # and MD on call # prior to instructions.   No egg or soy allergy known to patient   No issues known to pt with past sedation with any surgeries or procedures  Pt denies having issues being intubated  Patient denies ever being intubated  Pt has no issues moving head neck or swallowing  No FH of Malignant Hyperthermia  Pt is not on diet pills or shots  Pt is not on home 02   Pt is not on blood thinners   Pt denies issues with constipation    Pt is not on dialysis  Pt denise any abnormal heart rhythms   Pt denies any upcoming cardiac testing  Pt encouraged to use to use Singlecare or Goodrx to reduce cost   Patient's chart reviewed by Cathlyn Parsons CNRA prior to pre-visit and patient appropriate for the [facility].  Pre-visit completed and red dot placed by patient's name on their procedure day (on provider's schedule).  .  Visit by phone  Pt states weight is   Instructed pt why it is important to and  to call if they have any changes in health or new medications. Directed them to the # given and on instructions.     Instructions reviewed with pt and pt states understanding. Instructed to review again prior to procedure. Pt states they will.   Instructions sent by mail with coupon and by my chart

## 2023-02-13 ENCOUNTER — Encounter: Payer: Self-pay | Admitting: Internal Medicine

## 2023-02-28 ENCOUNTER — Ambulatory Visit: Payer: Medicare HMO | Admitting: Internal Medicine

## 2023-02-28 ENCOUNTER — Encounter: Payer: Self-pay | Admitting: Internal Medicine

## 2023-02-28 VITALS — BP 128/68 | HR 57 | Temp 98.4°F | Resp 11 | Ht 69.0 in | Wt 248.0 lb

## 2023-02-28 DIAGNOSIS — I10 Essential (primary) hypertension: Secondary | ICD-10-CM | POA: Diagnosis not present

## 2023-02-28 DIAGNOSIS — Z860100 Personal history of colon polyps, unspecified: Secondary | ICD-10-CM

## 2023-02-28 DIAGNOSIS — Z1211 Encounter for screening for malignant neoplasm of colon: Secondary | ICD-10-CM | POA: Diagnosis not present

## 2023-02-28 DIAGNOSIS — G4733 Obstructive sleep apnea (adult) (pediatric): Secondary | ICD-10-CM | POA: Diagnosis not present

## 2023-02-28 DIAGNOSIS — Z860101 Personal history of adenomatous and serrated colon polyps: Secondary | ICD-10-CM | POA: Diagnosis not present

## 2023-02-28 DIAGNOSIS — Z09 Encounter for follow-up examination after completed treatment for conditions other than malignant neoplasm: Secondary | ICD-10-CM

## 2023-02-28 DIAGNOSIS — E119 Type 2 diabetes mellitus without complications: Secondary | ICD-10-CM | POA: Diagnosis not present

## 2023-02-28 MED ORDER — SODIUM CHLORIDE 0.9 % IV SOLN: 500.0000 mL | INTRAVENOUS | Status: DC

## 2023-02-28 NOTE — Progress Notes (Signed)
Sedate, gd SR, tolerated procedure well, VSS, report to RN 

## 2023-02-28 NOTE — Op Note (Signed)
[facility] Patient Name: Edwin [name] Procedure Date: 02/28/2023 9:42 AM MRN: [id number] Endoscopist: [name]. Marina Goodell , MD, [id number] Age: 77 Referring MD:  Date of Birth: 21-Sep-1945 Gender: Male Account #: [id number] Procedure:                Colonoscopy Indications:              High risk colon cancer surveillance: Personal                            history of non-advanced adenoma. Previous                            examinations 2004, 2009, 2014 Medicines:                Monitored Anesthesia Care Procedure:                Pre-Anesthesia Assessment:                           - Prior to the procedure, a History and Physical                            was performed, and patient medications and                            allergies were reviewed. The patient's tolerance of                            previous anesthesia was also reviewed. The risks                            and benefits of the procedure and the sedation                            options and risks were discussed with the patient.                            All questions were answered, and informed consent                            was obtained. Prior Anticoagulants: The patient has                            taken no anticoagulant or antiplatelet agents. ASA                            Grade Assessment: II - A patient with mild systemic                            disease. After reviewing the risks and benefits,                            the patient was deemed in satisfactory condition to  undergo the procedure.                           After obtaining informed consent, the colonoscope                            was passed under direct vision. Throughout the                            procedure, the patient's blood pressure, pulse, and                            oxygen saturations were monitored continuously. The                            Olympus Scope SN: [id number] was introduced  through                            the anus and advanced to the the cecum, identified                            by appendiceal orifice and ileocecal valve. The                            ileocecal valve, appendiceal orifice, and rectum                            were photographed. The quality of the bowel                            preparation was excellent. The colonoscopy was                            performed without difficulty. The patient tolerated                            the procedure well. The bowel preparation used was                            SUPREP via split dose instruction. Scope In: 9:50:27 AM Scope Out: 10:03:12 AM Scope Withdrawal Time: 0 hours 8 minutes 28 seconds  Total Procedure Duration: 0 hours 12 minutes 45 seconds  Findings:                 Many diverticula were found in the entire colon.                           The exam was otherwise without abnormality on                            direct and retroflexion views. Complications:            No immediate complications. Estimated blood loss:  None. Estimated Blood Loss:     Estimated blood loss: none. Impression:               - Diverticulosis in the entire examined colon.                           - The examination was otherwise normal on direct                            and retroflexion views.                           - No specimens collected. Recommendation:           - Repeat colonoscopy is not recommended for                            surveillance.                           - Patient has a contact number available for                            emergencies. The signs and symptoms of potential                            delayed complications were discussed with the                            patient. Return to normal activities tomorrow.                            Written discharge instructions were provided to the                            patient.                            - Resume previous diet.                           - Continue present medications. [name]. Marina Goodell, MD 02/28/2023 10:12:04 AM This report has been signed electronically.

## 2023-02-28 NOTE — Progress Notes (Signed)
HISTORY OF PRESENT ILLNESS:  Edwin [name] is a 77 y.o. male with a personal history of nonadvanced adenoma.  Last colonoscopy 2014.  Now for surveillance  REVIEW OF SYSTEMS:  All non-GI ROS negative except for  Past Medical History:  Diagnosis Date   BPH (benign prostatic hypertrophy)    Cancer (HCC)    Basal cell of face, Dr. Karlyn Agee   Cataract    Diverticulosis    Sullivan Lone syndrome    Hyperglycemia    Hyperlipidemia    NMR Lipoprofile 2005; LDL 146 (2007/1330), HDL 38, TG 169.  LDL goal = <100; Framingham Study LDL goal =<130   Hypertension    OSA on CPAP    Preproliferative diabetic retinopathy (HCC)    Sleep apnea    Tubular adenoma of colon 01/19/2008    Past Surgical History:  Procedure Laterality Date   catract removal Bilateral    COLONOSCOPY  05/07/2012   neg; [facility]   COLONOSCOPY W/ POLYPECTOMY  2004 & 2009   Tics; [facility]   KNEE ARTHROSCOPY     [name], left knee   TONSILLECTOMY     Torn meniscus  Right    WISDOM TOOTH EXTRACTION      Social History JORIN LERSCH  reports that he has never smoked. He has never used smokeless tobacco. He reports current alcohol use. He reports that he does not use drugs.  family history includes Appendicitis in his brother and sister; Asthma in his brother; Cancer in his brother; Diabetes in his father; Diverticulosis in his mother; Heart attack (age of onset: 47) in his maternal grandfather; Heart attack (age of onset: 37) in his maternal grandmother; Heart disease (age of onset: 47) in his father; Hypertension in his father and sister; Parkinsonism in his maternal grandfather and mother; Stroke in his mother; Stroke (age of onset: 78) in his paternal grandmother.  No Known Allergies     PHYSICAL EXAMINATION: Vital signs: BP 133/71   Pulse 70   Temp 98.4 F (36.9 C)   Ht 5\' 9"  (1.753 m)   Wt 248 lb (112.5 kg)   SpO2 95%   BMI 36.62 kg/m  General: Well-developed, well-nourished, no acute  distress HEENT: Sclerae are anicteric, conjunctiva pink. Oral mucosa intact Lungs: Clear Heart: Regular Abdomen: soft, nontender, nondistended, no obvious ascites, no peritoneal signs, normal bowel sounds. No organomegaly. Extremities: No edema Psychiatric: alert and oriented x3. Cooperative     ASSESSMENT:  History of nonadvanced adenoma   PLAN:  Surveillance colonoscopy

## 2023-02-28 NOTE — Progress Notes (Signed)
VS by DT  Pt's states no medical or surgical changes since previsit or office visit.  

## 2023-02-28 NOTE — Patient Instructions (Signed)
-   Resume previous diet - Continue present medications. - Patient given educational handouts related to procedure.   YOU HAD AN ENDOSCOPIC PROCEDURE TODAY AT THE [facility]:   Refer to the procedure report that was given to you for any specific questions about what was found during the examination.  If the procedure report does not answer your questions, please call your gastroenterologist to clarify.  If you requested that your care partner not be given the details of your procedure findings, then the procedure report has been included in a sealed envelope for you to review at your convenience later.  YOU SHOULD EXPECT: Some feelings of bloating in the abdomen. Passage of more gas than usual.  Walking can help get rid of the air that was put into your GI tract during the procedure and reduce the bloating. If you had a lower endoscopy (such as a colonoscopy or flexible sigmoidoscopy) you may notice spotting of blood in your stool or on the toilet paper. If you underwent a bowel prep for your procedure, you may not have a normal bowel movement for a few days.  Please Note:  You might notice some irritation and congestion in your nose or some drainage.  This is from the oxygen used during your procedure.  There is no need for concern and it should clear up in a day or so.  SYMPTOMS TO REPORT IMMEDIATELY:  Following lower endoscopy (colonoscopy or flexible sigmoidoscopy):  Excessive amounts of blood in the stool  Significant tenderness or worsening of abdominal pains  Swelling of the abdomen that is new, acute  Fever of 100F or higher  For urgent or emergent issues, a gastroenterologist can be reached at any hour by calling [phone]. Do not use MyChart messaging for urgent concerns.    DIET:  We do recommend a small meal at first, but then you may proceed to your regular diet.  Drink plenty of fluids but you should avoid alcoholic beverages for 24 hours.  ACTIVITY:  You  should plan to take it easy for the rest of today and you should NOT DRIVE or use heavy machinery until tomorrow (because of the sedation medicines used during the test).    FOLLOW UP: Our staff will call the number listed on your records the next business day following your procedure.  We will call around 7:15- 8:00 am to check on you and address any questions or concerns that you may have regarding the information given to you following your procedure. If we do not reach you, we will leave a message.     If any biopsies were taken you will be contacted by phone or by letter within the next 1-3 weeks.  Please call us at [phone] if you have not heard about the biopsies in 3 weeks.    SIGNATURES/CONFIDENTIALITY: You and/or your care partner have signed paperwork which will be entered into your electronic medical record.  These signatures attest to the fact that that the information above on your After Visit Summary has been reviewed and is understood.  Full responsibility of the confidentiality of this discharge information lies with you and/or your care-partner.

## 2023-03-01 ENCOUNTER — Telehealth: Payer: Self-pay

## 2023-03-01 NOTE — Telephone Encounter (Signed)
Follow up call to pt, lm for pt to call if having any difficulty with normal activities or eating and drinking.  Also to call if any other questions or concerns.  

## 2023-05-06 ENCOUNTER — Ambulatory Visit (INDEPENDENT_AMBULATORY_CARE_PROVIDER_SITE_OTHER): Payer: Medicare HMO | Admitting: Family Medicine

## 2023-05-06 ENCOUNTER — Encounter: Payer: Self-pay | Admitting: Family Medicine

## 2023-05-06 VITALS — BP 130/76 | HR 70 | Temp 98.1°F | Ht 69.0 in | Wt 250.6 lb

## 2023-05-06 DIAGNOSIS — H1031 Unspecified acute conjunctivitis, right eye: Secondary | ICD-10-CM | POA: Diagnosis not present

## 2023-05-06 DIAGNOSIS — J069 Acute upper respiratory infection, unspecified: Secondary | ICD-10-CM

## 2023-05-06 MED ORDER — POLYMYXIN B-TRIMETHOPRIM 10000-0.1 UNIT/ML-% OP SOLN: 1.0000 [drp] | OPHTHALMIC | 0 refills | Status: AC

## 2023-05-06 NOTE — Patient Instructions (Addendum)
Continue Mucinex as needed for congestion.  Follow-up with me for new or worsening symptoms.  I have sent in polytrim drops for you to use to the affected eye, one drop every 4 hours for 7 days.

## 2023-05-07 ENCOUNTER — Encounter: Payer: Self-pay | Admitting: Internal Medicine

## 2023-05-07 NOTE — Progress Notes (Deleted)
      Subjective:    Patient ID: Edwin [name], male    DOB: 03-15-1946, 77 y.o.   MRN: [id number]     HPI Edwin [name] is here for follow up of his chronic medical problems.    Medications and allergies reviewed with patient and updated if appropriate.  Current Outpatient Medications on File Prior to Visit  Medication Sig Dispense Refill   albuterol  (VENTOLIN  HFA) 108 (90 Base) MCG/ACT inhaler Inhale 2 puffs into the lungs every 6 (six) hours as needed (wheezing or cough). 8 g 0   aspirin 81 MG tablet Take 81 mg by mouth daily.     atorvastatin  (LIPITOR) 10 MG tablet TAKE 1 TABLET EVERY OTHER DAY 45 tablet 3   azelastine (ASTELIN) 0.1 % nasal spray Place into the nose.     Cholecalciferol (VITAMIN D3) 1000 UNITS CAPS Take 1 capsule by mouth daily.     Cyanocobalamin  (VITAMIN B-12 PO) Take by mouth as directed.     empagliflozin  (JARDIANCE ) 10 MG TABS tablet Take 1 tablet (10 mg total) by mouth daily before breakfast. 30 tablet 5   metoprolol  succinate (TOPROL -XL) 50 MG 24 hr tablet TAKE 1 TABLET ONCE DAILY. TAKE WITH OR IMMEDIATELY FOLLOWING A MEAL. 90 tablet 3   Multiple Vitamin (MULTIVITAMIN) tablet Take 1 tablet by mouth daily.     naproxen sodium (ALEVE) 220 MG tablet Take 220 mg by mouth daily as needed.     nitroGLYCERIN  (NITROSTAT ) 0.4 MG SL tablet Place 1 tablet (0.4 mg total) under the tongue every 5 (five) minutes as needed for chest pain. 25 tablet 3   Omega-3 Fatty Acids (FISH OIL) 300 MG CAPS Take by mouth.     trimethoprim -polymyxin b  (POLYTRIM ) ophthalmic solution Place 1 drop into the right eye every 4 (four) hours for 7 days. 10 mL 0   Turmeric (QC TUMERIC COMPLEX PO) Take by mouth daily.     No current facility-administered medications on file prior to visit.     Review of Systems     Objective:  There were no vitals filed for this visit. BP Readings from Last 3 Encounters:  05/06/23 130/76  02/28/23 128/68  01/28/23 130/80   Wt Readings from Last 3  Encounters:  05/06/23 250 lb 9.6 oz (113.7 kg)  02/28/23 248 lb (112.5 kg)  02/07/23 241 lb (109.3 kg)   There is no height or weight on file to calculate BMI.    Physical Exam     Lab Results  Component Value Date   WBC 5.7 01/29/2023   HGB 14.7 01/29/2023   HCT 42.9 01/29/2023   PLT 189.0 01/29/2023   GLUCOSE 298 (H) 01/29/2023   CHOL 133 01/29/2023   TRIG 203.0 (H) 01/29/2023   HDL 34.80 (L) 01/29/2023   LDLDIRECT 43.0 08/22/2022   LDLCALC 58 01/29/2023   ALT 16 01/29/2023   AST 12 01/29/2023   NA 138 01/29/2023   K 4.1 01/29/2023   CL 105 01/29/2023   CREATININE 0.91 01/29/2023   BUN 14 01/29/2023   CO2 25 01/29/2023   TSH 4.56 08/22/2022   PSA 4.13 (H) 10/29/2019   HGBA1C 10.1 (H) 01/29/2023   MICROALBUR 2.4 (H) 08/22/2022     Assessment & Plan:    See Problem List for Assessment and Plan of chronic medical problems.

## 2023-05-13 ENCOUNTER — Ambulatory Visit: Payer: Medicare HMO | Admitting: Internal Medicine

## 2023-05-13 DIAGNOSIS — E1169 Type 2 diabetes mellitus with other specified complication: Secondary | ICD-10-CM

## 2023-05-13 DIAGNOSIS — I10 Essential (primary) hypertension: Secondary | ICD-10-CM

## 2023-05-13 DIAGNOSIS — I2510 Atherosclerotic heart disease of native coronary artery without angina pectoris: Secondary | ICD-10-CM

## 2023-05-23 DIAGNOSIS — D2262 Melanocytic nevi of left upper limb, including shoulder: Secondary | ICD-10-CM | POA: Diagnosis not present

## 2023-05-23 DIAGNOSIS — D2261 Melanocytic nevi of right upper limb, including shoulder: Secondary | ICD-10-CM | POA: Diagnosis not present

## 2023-05-23 DIAGNOSIS — D0462 Carcinoma in situ of skin of left upper limb, including shoulder: Secondary | ICD-10-CM | POA: Diagnosis not present

## 2023-05-23 DIAGNOSIS — B360 Pityriasis versicolor: Secondary | ICD-10-CM | POA: Diagnosis not present

## 2023-05-23 DIAGNOSIS — D225 Melanocytic nevi of trunk: Secondary | ICD-10-CM | POA: Diagnosis not present

## 2023-05-23 DIAGNOSIS — L821 Other seborrheic keratosis: Secondary | ICD-10-CM | POA: Diagnosis not present

## 2023-05-23 DIAGNOSIS — Z85828 Personal history of other malignant neoplasm of skin: Secondary | ICD-10-CM | POA: Diagnosis not present

## 2023-05-23 DIAGNOSIS — D2239 Melanocytic nevi of other parts of face: Secondary | ICD-10-CM | POA: Diagnosis not present

## 2023-05-23 DIAGNOSIS — L570 Actinic keratosis: Secondary | ICD-10-CM | POA: Diagnosis not present

## 2023-05-23 DIAGNOSIS — L905 Scar conditions and fibrosis of skin: Secondary | ICD-10-CM | POA: Diagnosis not present

## 2023-06-07 NOTE — Progress Notes (Signed)
 Patient ID: Edwin [name], male   DOB: Apr 03, 1946, 78 y.o.   MRN: [id number]     Cardiology Office Note   Date:  06/17/2023   ID:  Edwin, [name] 1945-09-27, MRN [id number]  PCP:  Colene Dauphin, MD  Cardiologist:   Janelle Mediate, MD   No chief complaint on file.      History of Present Illness: Edwin [name] is a 78 y.o. male initially seen for abnormal ECG 09/2014    Had ETT 2016 with PA in April Exercised for about 7 mintues Baseline ECG with RBBB with nonspecific ST changes  Should not have had treadmill with baseline abnormal ECG  As these were accentuated with stress No VT. CRF;s HTN on Rx including statin  Sleep apnea   Wears CPAP  .    09/11/14 cardiac CT showed very high calcium  score 1010 which was 91 st percentile with moderate mid LAD/RCA disease Last non ischemic myovue was 07/04/22 with EF 60%    Likes to travel to [place] with grand kids and has been to [employer] [facility] Dr Felipe Horton for chronic back and shoulder pain Has had injections   No cardiac complaints Compliant with his meds and CPAP   Seeing grand kids in [place] and others just moved to [place] from [place]   Discussed weight loss and diet regarding elevated BP He will check at home and call if elevated    Past Medical History:  Diagnosis Date   BPH (benign prostatic hypertrophy)    Cancer (HCC)    Basal cell of face, Dr. Marcia Setters   Cataract    Diverticulosis    Oletta Berry syndrome    Hyperglycemia    Hyperlipidemia    NMR Lipoprofile 2005; LDL 146 (2007/1330), HDL 38, TG 169.  LDL goal = <100; Framingham Study LDL goal =<130   Hypertension    OSA on CPAP    Preproliferative diabetic retinopathy (HCC)    Sleep apnea    Tubular adenoma of colon 01/19/2008    Past Surgical History:  Procedure Laterality Date   catract removal Bilateral    COLONOSCOPY  05/07/2012   neg; [facility]   COLONOSCOPY W/ POLYPECTOMY  2004 & 2009   Tics; [facility]   KNEE ARTHROSCOPY     [name],  left knee   TONSILLECTOMY     Torn meniscus  Right    WISDOM TOOTH EXTRACTION       Current Outpatient Medications  Medication Sig Dispense Refill   albuterol  (VENTOLIN  HFA) 108 (90 Base) MCG/ACT inhaler Inhale 2 puffs into the lungs every 6 (six) hours as needed (wheezing or cough). 8 g 0   aspirin 81 MG tablet Take 81 mg by mouth daily.     atorvastatin  (LIPITOR) 10 MG tablet TAKE 1 TABLET EVERY OTHER DAY 45 tablet 3   azelastine (ASTELIN) 0.1 % nasal spray Place into the nose.     Cholecalciferol (VITAMIN D3) 1000 UNITS CAPS Take 1 capsule by mouth daily.     Cyanocobalamin (VITAMIN B-12 PO) Take by mouth as directed.     empagliflozin  (JARDIANCE ) 10 MG TABS tablet Take 1 tablet (10 mg total) by mouth daily before breakfast. 30 tablet 5   metoprolol  succinate (TOPROL -XL) 50 MG 24 hr tablet TAKE 1 TABLET ONCE DAILY. TAKE WITH OR IMMEDIATELY FOLLOWING A MEAL. 90 tablet 3   Multiple Vitamin (MULTIVITAMIN) tablet Take 1 tablet by mouth daily.     naproxen sodium (ALEVE) 220  MG tablet Take 220 mg by mouth daily as needed.     nitroGLYCERIN  (NITROSTAT ) 0.4 MG SL tablet Place 1 tablet (0.4 mg total) under the tongue every 5 (five) minutes as needed for chest pain. 25 tablet 3   Omega-3 Fatty Acids (FISH OIL) 300 MG CAPS Take by mouth.     trimethoprim -polymyxin b  (POLYTRIM ) ophthalmic solution SMARTSIG:In Eye(s)     Turmeric (QC TUMERIC COMPLEX PO) Take by mouth daily.     No current facility-administered medications for this visit.    Allergies:   Patient has no known allergies.    Social History:  The patient  reports that he has never smoked. He has never used smokeless tobacco. He reports current alcohol use. He reports that he does not use drugs.   Family History:  The patient's family history includes Appendicitis in his brother and sister; Asthma in his brother; Cancer in his brother; Diabetes in his father; Diverticulosis in his mother; Heart attack (age of onset: 74) in his  maternal grandfather; Heart attack (age of onset: 75) in his maternal grandmother; Heart disease (age of onset: 84) in his father; Hypertension in his father and sister; Parkinsonism in his maternal grandfather and mother; Stroke in his mother; Stroke (age of onset: 74) in his paternal grandmother.    ROS:  Please see the history of present illness.   Otherwise, review of systems are positive for none.   All other systems are reviewed and negative.      PHYSICAL EXAM: VS:  BP 116/70   Pulse 64   Ht 5\' 9"  (1.753 m)   Wt 248 lb 12.8 oz (112.9 kg)   SpO2 96%   BMI 36.74 kg/m  , BMI Body mass index is 36.74 kg/m. Affect appropriate Healthy:  appears stated age HEENT: normal Neck supple with no adenopathy JVP normal no bruits no thyromegaly Lungs clear with no wheezing and good diaphragmatic motion Heart:  S1/S2 no murmur, no rub, gallop or click PMI normal Abdomen: benighn, BS positve, no tenderness, no AAA no bruit.  No HSM or HJR Distal pulses intact with no bruits No edema Neuro non-focal Skin warm and dry No muscular weakness   EKG:   06/17/2023 SR rate 65 RBBB no acute changes    Recent Labs: 08/22/2022: TSH 4.56 01/29/2023: Hemoglobin 14.7; Platelets 189.0 06/14/2023: ALT 17; BUN 27; Creatinine, Ser 1.00; Potassium 4.3; Sodium 143    Lipid Panel    Component Value Date/Time   CHOL 153 06/14/2023 0946   CHOL 202 (H) 06/02/2014 1116   TRIG 226.0 (H) 06/14/2023 0946   TRIG 213 (H) 06/02/2014 1116   TRIG 179 (H) 05/13/2006 1239   HDL 42.60 06/14/2023 0946   HDL 44 06/02/2014 1116   CHOLHDL 4 06/14/2023 0946   VLDL 45.2 (H) 06/14/2023 0946   LDLCALC 65 06/14/2023 0946   LDLCALC 115 (H) 06/02/2014 1116   LDLDIRECT 43.0 08/22/2022 0842      Wt Readings from Last 3 Encounters:  06/17/23 248 lb 12.8 oz (112.9 kg)  06/14/23 242 lb (109.8 kg)  05/06/23 250 lb 9.6 oz (113.7 kg)      Other studies Reviewed:  Myovue  06/2022  Study Highlights      The study is  normal. The study is low risk.   No ST deviation was noted.   LV perfusion is normal. There is no evidence of ischemia. There is no evidence of infarction.   Left ventricular function is normal. EF 60% End  diastolic cavity size is normal. End systolic cavity size is normal.  ASSESSMENT AND PLAN:  1. CAD: Moderate LAD/Circ disease by cardiac CT June 2016 Normal perfusion study 12/26/17 and more recently 07/04/22 EF 60% Continue medical Rx   2. Chol: On statin LDL 58  3. RBBB:  No change on ECG yearly ECG   4. PAC;s  Benign asymptomatic on beta blocker   5. HTN:  Well controlled.  Continue current medications and low sodium Dash type diet.    6. Derm:  Post mow's with basal cell removal left nares no recurrence   7. OSA:  Continue CPAP discussed weight loss strategies   8. DM:  Discussed low carb diet.  Target hemoglobin A1c is 6.5 or less.  Continue current medications. A1c 5.9 on 10/29/19             Disposition:   FU with me in a year  if non ischemic    Janelle Mediate

## 2023-06-13 ENCOUNTER — Encounter: Payer: Self-pay | Admitting: Internal Medicine

## 2023-06-13 NOTE — Patient Instructions (Addendum)
      Blood work was ordered.       Medications changes include :   None      Return in about 6 months (around 12/12/2023) for Physical Exam.

## 2023-06-13 NOTE — Progress Notes (Signed)
 Subjective:    Patient ID: Edwin [name], male    DOB: 21-Sep-1945, 78 y.o.   MRN: [id number]     HPI [name] is here for follow up of his chronic medical problems.  Eating pretty well.  Urinating more with the Jardiance .  Exercises in spurts.  Feels like he is eating pretty well-watching the sugars and carbs, but not always good.  He knows he needs to lose weight.  Medications and allergies reviewed with patient and updated if appropriate.  Current Outpatient Medications on File Prior to Visit  Medication Sig Dispense Refill   albuterol  (VENTOLIN  HFA) 108 (90 Base) MCG/ACT inhaler Inhale 2 puffs into the lungs every 6 (six) hours as needed (wheezing or cough). 8 g 0   aspirin 81 MG tablet Take 81 mg by mouth daily.     atorvastatin  (LIPITOR) 10 MG tablet TAKE 1 TABLET EVERY OTHER DAY 45 tablet 3   Cholecalciferol (VITAMIN D3) 1000 UNITS CAPS Take 1 capsule by mouth daily.     Cyanocobalamin  (VITAMIN B-12 PO) Take by mouth as directed.     empagliflozin  (JARDIANCE ) 10 MG TABS tablet Take 1 tablet (10 mg total) by mouth daily before breakfast. 30 tablet 5   metoprolol  succinate (TOPROL -XL) 50 MG 24 hr tablet TAKE 1 TABLET ONCE DAILY. TAKE WITH OR IMMEDIATELY FOLLOWING A MEAL. 90 tablet 3   Multiple Vitamin (MULTIVITAMIN) tablet Take 1 tablet by mouth daily.     naproxen sodium (ALEVE) 220 MG tablet Take 220 mg by mouth daily as needed.     nitroGLYCERIN  (NITROSTAT ) 0.4 MG SL tablet Place 1 tablet (0.4 mg total) under the tongue every 5 (five) minutes as needed for chest pain. 25 tablet 3   Omega-3 Fatty Acids (FISH OIL) 300 MG CAPS Take by mouth.     trimethoprim -polymyxin b  (POLYTRIM ) ophthalmic solution SMARTSIG:In Eye(s)     Turmeric (QC TUMERIC COMPLEX PO) Take by mouth daily.     azelastine (ASTELIN) 0.1 % nasal spray Place into the nose.     No current facility-administered medications on file prior to visit.     Review of Systems  Constitutional:  Negative for  fever.  Respiratory:  Negative for cough, shortness of breath and wheezing.   Cardiovascular:  Negative for chest pain, palpitations and leg swelling.  Neurological:  Negative for light-headedness and headaches.       Objective:   Vitals:   06/14/23 0852  BP: 130/76  Pulse: 72  Temp: 98.1 F (36.7 C)  SpO2: 95%   BP Readings from Last 3 Encounters:  06/14/23 130/76  05/06/23 130/76  02/28/23 128/68   Wt Readings from Last 3 Encounters:  06/14/23 242 lb (109.8 kg)  05/06/23 250 lb 9.6 oz (113.7 kg)  02/28/23 248 lb (112.5 kg)   Body mass index is 35.74 kg/m.    Physical Exam Constitutional:      General: He is not in acute distress.    Appearance: Normal appearance. He is not ill-appearing.  HENT:     Head: Normocephalic and atraumatic.  Eyes:     Conjunctiva/sclera: Conjunctivae normal.  Cardiovascular:     Rate and Rhythm: Normal rate and regular rhythm.     Heart sounds: Normal heart sounds.  Pulmonary:     Effort: Pulmonary effort is normal. No respiratory distress.     Breath sounds: Normal breath sounds. No wheezing or rales.  Musculoskeletal:     Right lower leg: No edema.  Left lower leg: No edema.  Skin:    General: Skin is warm and dry.     Findings: No rash.  Neurological:     Mental Status: He is alert. Mental status is at baseline.  Psychiatric:        Mood and Affect: Mood normal.        Lab Results  Component Value Date   WBC 5.7 01/29/2023   HGB 14.7 01/29/2023   HCT 42.9 01/29/2023   PLT 189.0 01/29/2023   GLUCOSE 298 (H) 01/29/2023   CHOL 133 01/29/2023   TRIG 203.0 (H) 01/29/2023   HDL 34.80 (L) 01/29/2023   LDLDIRECT 43.0 08/22/2022   LDLCALC 58 01/29/2023   ALT 16 01/29/2023   AST 12 01/29/2023   NA 138 01/29/2023   K 4.1 01/29/2023   CL 105 01/29/2023   CREATININE 0.91 01/29/2023   BUN 14 01/29/2023   CO2 25 01/29/2023   TSH 4.56 08/22/2022   PSA 4.13 (H) 10/29/2019   HGBA1C 10.1 (H) 01/29/2023   MICROALBUR 2.4  (H) 08/22/2022     Assessment & Plan:    See Problem List for Assessment and Plan of chronic medical problems.

## 2023-06-14 ENCOUNTER — Ambulatory Visit (INDEPENDENT_AMBULATORY_CARE_PROVIDER_SITE_OTHER): Payer: Medicare HMO | Admitting: Internal Medicine

## 2023-06-14 VITALS — BP 130/76 | HR 72 | Temp 98.1°F | Ht 69.0 in | Wt 242.0 lb

## 2023-06-14 DIAGNOSIS — Z7984 Long term (current) use of oral hypoglycemic drugs: Secondary | ICD-10-CM | POA: Diagnosis not present

## 2023-06-14 DIAGNOSIS — I10 Essential (primary) hypertension: Secondary | ICD-10-CM

## 2023-06-14 DIAGNOSIS — I2510 Atherosclerotic heart disease of native coronary artery without angina pectoris: Secondary | ICD-10-CM

## 2023-06-14 DIAGNOSIS — E1169 Type 2 diabetes mellitus with other specified complication: Secondary | ICD-10-CM | POA: Diagnosis not present

## 2023-06-14 DIAGNOSIS — E782 Mixed hyperlipidemia: Secondary | ICD-10-CM | POA: Diagnosis not present

## 2023-06-14 LAB — LIPID PANEL
Cholesterol: 153 mg/dL (ref 0–200)
HDL: 42.6 mg/dL (ref >39.00)
LDL Cholesterol: 65 mg/dL (ref 0–99)
NonHDL: 110.36
Total CHOL/HDL Ratio: 4
Triglycerides: 226 mg/dL — ABNORMAL HIGH (ref 0.0–149.0)
VLDL: 45.2 mg/dL — ABNORMAL HIGH (ref 0.0–40.0)

## 2023-06-14 LAB — COMPREHENSIVE METABOLIC PANEL
ALT: 17 U/L (ref 0–53)
AST: 18 U/L (ref 0–37)
Albumin: 4.5 g/dL (ref 3.5–5.2)
Alkaline Phosphatase: 36 U/L — ABNORMAL LOW (ref 39–117)
BUN: 27 mg/dL — ABNORMAL HIGH (ref 6–23)
CO2: 25 meq/L (ref 19–32)
Calcium: 9.5 mg/dL (ref 8.4–10.5)
Chloride: 106 meq/L (ref 96–112)
Creatinine, Ser: 1 mg/dL (ref 0.40–1.50)
GFR: 73.64 mL/min (ref >60.00)
Glucose, Bld: 160 mg/dL — ABNORMAL HIGH (ref 70–99)
Potassium: 4.3 meq/L (ref 3.5–5.1)
Sodium: 143 meq/L (ref 135–145)
Total Bilirubin: 0.9 mg/dL (ref 0.2–1.2)
Total Protein: 7.5 g/dL (ref 6.0–8.3)

## 2023-06-14 LAB — HEMOGLOBIN A1C: Hgb A1c MFr Bld: 8.2 % — ABNORMAL HIGH (ref 4.6–6.5)

## 2023-06-14 NOTE — Assessment & Plan Note (Addendum)
 Chronic BMI > 35 with comorbidities of hyperlipidemia, Hypertension and CAD, diabetes  Encouraged weight loss He is exercising, but not regularly-more in spurts-encouraged him to make it more regular

## 2023-06-14 NOTE — Assessment & Plan Note (Signed)
 Chronic  Lab Results  Component Value Date   HGBA1C 10.1 (H) 01/29/2023   Sugars not ideally controlled Check A1c Continue Jardiance  10 mg daily-depending on A1c will increase to 25 mg Stressed regular exercise, diabetic diet Encouraged weight loss

## 2023-06-14 NOTE — Assessment & Plan Note (Signed)
 Chronic Regular exercise and healthy diet encouraged Lab Results  Component Value Date   LDLCALC 58 01/29/2023   LDL at goal Check lipid panel, CMP Continue atorvastatin  10 mg daily

## 2023-06-14 NOTE — Assessment & Plan Note (Signed)
 Chronic BP controlled Continue  Metoprolol  xl 50 mg daily  Check CMP

## 2023-06-14 NOTE — Assessment & Plan Note (Signed)
 Chronic No symptoms consistent with angina Continue aspirin 81 mg daily, atorvastatin  10 mg daily Blood pressure controlled Sugars not ideally controlled healthy diet , regular exercise encouraged Lipid panel, CMP

## 2023-06-16 ENCOUNTER — Encounter: Payer: Self-pay | Admitting: Internal Medicine

## 2023-06-17 ENCOUNTER — Ambulatory Visit: Payer: Medicare HMO | Attending: Cardiovascular Disease | Admitting: Cardiovascular Disease

## 2023-06-17 ENCOUNTER — Encounter: Payer: Self-pay | Admitting: Cardiovascular Disease

## 2023-06-17 VITALS — BP 116/70 | HR 64 | Ht 69.0 in | Wt 248.8 lb

## 2023-06-17 DIAGNOSIS — R072 Precordial pain: Secondary | ICD-10-CM | POA: Diagnosis not present

## 2023-06-17 DIAGNOSIS — I10 Essential (primary) hypertension: Secondary | ICD-10-CM

## 2023-06-17 MED ORDER — NITROGLYCERIN 0.4 MG SL SUBL: 0.4000 mg | SUBLINGUAL_TABLET | SUBLINGUAL | 3 refills | Status: AC | PRN

## 2023-06-17 NOTE — Patient Instructions (Signed)

## 2023-06-20 LAB — MICROALBUMIN / CREATININE URINE RATIO
Creatinine,U: 75.7 mg/dL
Microalb Creat Ratio: 18 mg/g (ref 0.0–30.0)
Microalb, Ur: 1.3 mg/dL (ref 0.0–1.9)

## 2023-06-24 MED ORDER — EMPAGLIFLOZIN 25 MG PO TABS: 25.0000 mg | ORAL_TABLET | Freq: Every day | ORAL | 1 refills | Status: DC

## 2023-07-25 ENCOUNTER — Other Ambulatory Visit: Payer: Self-pay | Admitting: Cardiovascular Disease

## 2023-07-25 DIAGNOSIS — E7849 Other hyperlipidemia: Secondary | ICD-10-CM

## 2023-07-25 DIAGNOSIS — R931 Abnormal findings on diagnostic imaging of heart and coronary circulation: Secondary | ICD-10-CM

## 2023-07-29 NOTE — Progress Notes (Unsigned)
 Subjective:    Patient ID: Edwin [name], male    DOB: 07/01/1945, 77 y.o.   MRN: [id number]     HPI Edwin [name] is here for follow up of his chronic medical problems.  Taking jardiance daily.  He is experiencing increased urination with medication-he does not feel that this has increased with the increased dose  He is exercising.  Weight has been stable.  He is doing better with his diet.  Medications and allergies reviewed with patient and updated if appropriate.  Current Outpatient Medications on File Prior to Visit  Medication Sig Dispense Refill   albuterol (VENTOLIN HFA) 108 (90 Base) MCG/ACT inhaler Inhale 2 puffs into the lungs every 6 (six) hours as needed (wheezing or cough). 8 g 0   aspirin 81 MG tablet Take 81 mg by mouth daily.     atorvastatin (LIPITOR) 10 MG tablet TAKE 1 TABLET EVERY OTHER DAY 45 tablet 3   Cholecalciferol (VITAMIN D3) 1000 UNITS CAPS Take 1 capsule by mouth daily.     Cyanocobalamin (VITAMIN B-12 PO) Take by mouth as directed.     empagliflozin (JARDIANCE) 25 MG TABS tablet Take 1 tablet (25 mg total) by mouth daily before breakfast. 90 tablet 1   metoprolol succinate (TOPROL-XL) 50 MG 24 hr tablet TAKE 1 TABLET ONCE DAILY. TAKE WITH OR IMMEDIATELY FOLLOWING A MEAL. 90 tablet 3   Multiple Vitamin (MULTIVITAMIN) tablet Take 1 tablet by mouth daily.     naproxen sodium (ALEVE) 220 MG tablet Take 220 mg by mouth daily as needed.     nitroGLYCERIN (NITROSTAT) 0.4 MG SL tablet Place 1 tablet (0.4 mg total) under the tongue every 5 (five) minutes as needed for chest pain. 25 tablet 3   Omega-3 Fatty Acids (FISH OIL) 300 MG CAPS Take by mouth.     Turmeric (QC TUMERIC COMPLEX PO) Take by mouth daily.     azelastine (ASTELIN) 0.1 % nasal spray Place into the nose.     trimethoprim-polymyxin b (POLYTRIM) ophthalmic solution SMARTSIG:In Eye(s) (Patient not taking: Reported on 07/30/2023)     No current facility-administered medications on file prior to  visit.     Review of Systems  Respiratory:  Negative for shortness of breath.   Cardiovascular:  Negative for chest pain, palpitations and leg swelling.  Genitourinary:  Positive for frequency (with jaridance).  Neurological:  Negative for light-headedness and headaches.       Objective:   Vitals:   07/30/23 0944  BP: 126/62  Pulse: 68  Temp: 98.1 F (36.7 C)  SpO2: 98%   BP Readings from Last 3 Encounters:  07/30/23 126/62  06/17/23 116/70  06/14/23 130/76   Wt Readings from Last 3 Encounters:  07/30/23 246 lb 12.8 oz (111.9 kg)  06/17/23 248 lb 12.8 oz (112.9 kg)  06/14/23 242 lb (109.8 kg)   Body mass index is 36.45 kg/m.    Physical Exam Constitutional:      General: He is not in acute distress.    Appearance: Normal appearance. He is not ill-appearing.  HENT:     Head: Normocephalic and atraumatic.  Eyes:     Conjunctiva/sclera: Conjunctivae normal.  Cardiovascular:     Rate and Rhythm: Normal rate and regular rhythm.     Heart sounds: Normal heart sounds.  Pulmonary:     Effort: Pulmonary effort is normal. No respiratory distress.     Breath sounds: Normal breath sounds. No wheezing or rales.  Musculoskeletal:  Right lower leg: No edema.     Left lower leg: No edema.  Skin:    General: Skin is warm and dry.     Findings: No rash.  Neurological:     Mental Status: He is alert. Mental status is at baseline.  Psychiatric:        Mood and Affect: Mood normal.        Lab Results  Component Value Date   WBC 5.7 01/29/2023   HGB 14.7 01/29/2023   HCT 42.9 01/29/2023   PLT 189.0 01/29/2023   GLUCOSE 160 (H) 06/14/2023   CHOL 153 06/14/2023   TRIG 226.0 (H) 06/14/2023   HDL 42.60 06/14/2023   LDLDIRECT 43.0 08/22/2022   LDLCALC 65 06/14/2023   ALT 17 06/14/2023   AST 18 06/14/2023   NA 143 06/14/2023   K 4.3 06/14/2023   CL 106 06/14/2023   CREATININE 1.00 06/14/2023   BUN 27 (H) 06/14/2023   CO2 25 06/14/2023   TSH 4.56 08/22/2022    PSA 4.13 (H) 10/29/2019   HGBA1C 7.3 (A) 07/30/2023   MICROALBUR 1.3 06/14/2023     Assessment & Plan:    See Problem List for Assessment and Plan of chronic medical problems.

## 2023-07-29 NOTE — Patient Instructions (Addendum)
      Your A1c is 7.3%  - our goal is less than 7%    Medications changes include :   None     Return in about 6 months (around 01/30/2024) for Physical Exam.

## 2023-07-30 ENCOUNTER — Encounter: Payer: Self-pay | Admitting: Internal Medicine

## 2023-07-30 ENCOUNTER — Ambulatory Visit (INDEPENDENT_AMBULATORY_CARE_PROVIDER_SITE_OTHER): Payer: Medicare HMO | Admitting: Internal Medicine

## 2023-07-30 VITALS — BP 126/62 | HR 68 | Temp 98.1°F | Ht 69.0 in | Wt 246.8 lb

## 2023-07-30 DIAGNOSIS — E1169 Type 2 diabetes mellitus with other specified complication: Secondary | ICD-10-CM | POA: Diagnosis not present

## 2023-07-30 DIAGNOSIS — I2510 Atherosclerotic heart disease of native coronary artery without angina pectoris: Secondary | ICD-10-CM

## 2023-07-30 DIAGNOSIS — I10 Essential (primary) hypertension: Secondary | ICD-10-CM

## 2023-07-30 DIAGNOSIS — Z6836 Body mass index (BMI) 36.0-36.9, adult: Secondary | ICD-10-CM | POA: Diagnosis not present

## 2023-07-30 DIAGNOSIS — E782 Mixed hyperlipidemia: Secondary | ICD-10-CM | POA: Diagnosis not present

## 2023-07-30 DIAGNOSIS — E038 Other specified hypothyroidism: Secondary | ICD-10-CM

## 2023-07-30 LAB — POCT GLYCOSYLATED HEMOGLOBIN (HGB A1C): Hemoglobin A1C: 7.3 % — AB (ref 4.0–5.6)

## 2023-07-30 NOTE — Assessment & Plan Note (Signed)
 Chronic BP controlled Continue  Metoprolol xl 50 mg daily

## 2023-07-30 NOTE — Assessment & Plan Note (Signed)
 Chronic Following with cardiology No symptoms consistent with angina Continue aspirin 81 mg daily, atorvastatin 10 mg daily Blood pressure controlled Sugars not ideally controlled healthy diet , regular exercise encouraged

## 2023-07-30 NOTE — Assessment & Plan Note (Addendum)
 Chronic BMI > 35 with comorbidities of hyperlipidemia, Hypertension and CAD, diabetes  Encouraged weight loss-he does want to lose weight, but is having difficulty getting weight off He is exercising more regularly and watching his diet more Increase exercise Decrease portions/calorie intake

## 2023-07-30 NOTE — Assessment & Plan Note (Signed)
 Chronic Regular exercise and healthy diet encouraged Lab Results  Component Value Date   LDLCALC 65 06/14/2023   LDL at goal Check lipid panel, CMP Continue atorvastatin 10 mg daily

## 2023-07-30 NOTE — Assessment & Plan Note (Addendum)
 Chronic  Lab Results  Component Value Date   HGBA1C 8.2 (H) 06/14/2023   Sugars not ideally controlled Check A1c today is 7.3%-improved Continue Jardiance 25 mg He wondered about a GLP-1 inhibitor which we did discuss and he will think about-discussed possible side effects and pros and cons of taking medication Stressed regular exercise, diabetic diet Encouraged weight loss

## 2023-09-07 ENCOUNTER — Other Ambulatory Visit: Payer: Self-pay | Admitting: Internal Medicine

## 2023-09-18 NOTE — Progress Notes (Unsigned)
 [facility] [address] [place] [address] Phone: [phone] Subjective:   Edwin [name], am serving as a scribe for Dr. Ronnell Coins.  I'm seeing this patient by the request  of:  Colene Dauphin, MD  CC: Left knee pain follow-up  [id number]  02/14/2021 Lower back pain that he has had for years. Patient's x-rays have shown some mild progression noted especially with the advanced L3-L4 and L4-L5 arthropathy and degenerative disc disease patient has had this pain for 4 years but slowly progressing and not responding to conservative therapy. Do feel MRI could be beneficial to further evaluate depending on findings we can discuss the possibility of epidurals but we do think that ankle pain avoid any surgical intervention. If patient has any worsening pain, bowel or bladder incontinence, or with depending on findings we will discuss further management.  Patient is having increasing limited range of motion.  Patient states that the pain is starting to affect daily activities.  Patient does have a mild positive empty can which is new as well.  Discussed with patient again at great length.  I discussed icing regimen and home exercises.  Discussed which activities to do which wants to avoid.  We did discuss that with patient failing all other conservative therapy including formal physical therapy I do feel that MRI, MR arthrogram would be necessary at this time.  Follow-up with me after imaging to discuss further.     Updated 09/19/2023 Edwin [name] is a 78 y.o. male coming in with complaint of L knee pain. Patient states that visco injection in L knee 2 years ago was helpful. Pain increasing recently. Feels like the knee is weak at times.        Past Medical History:  Diagnosis Date   BPH (benign prostatic hypertrophy)    Cancer (HCC)    Basal cell of face, Dr. Marcia Setters   Cataract    Diverticulosis    Oletta Berry syndrome    Hyperglycemia     Hyperlipidemia    NMR Lipoprofile 2005; LDL 146 (2007/1330), HDL 38, TG 169.  LDL goal = <100; Framingham Study LDL goal =<130   Hypertension    OSA on CPAP    Preproliferative diabetic retinopathy (HCC)    Sleep apnea    Tubular adenoma of colon 01/19/2008   Past Surgical History:  Procedure Laterality Date   catract removal Bilateral    COLONOSCOPY  05/07/2012   neg; [facility]   COLONOSCOPY W/ POLYPECTOMY  2004 & 2009   Tics; [facility]   KNEE ARTHROSCOPY     [name], left knee   TONSILLECTOMY     Torn meniscus  Right    WISDOM TOOTH EXTRACTION     Social History   Socioeconomic History   Marital status: Married    Spouse name: Not on file   Number of children: 2   Years of education: 18   Highest education level: Not on file  Occupational History   Occupation: Gaffer // Retired    Associate Professor: [employer]   Tobacco Use   Smoking status: Never   Smokeless tobacco: Never  Vaping Use   Vaping status: Never Used  Substance and Sexual Activity   Alcohol use: Yes    Alcohol/week: 0.0 standard drinks of alcohol    Comment: socially, 3-5 beers/week   Drug use: No   Sexual activity: Yes  Other Topics Concern   Not on file  Social History Narrative   Fun: Travel, read, movies, concerts    Denies abuse and feels safe at home.    Social Drivers of Corporate investment banker Strain: Low Risk  (10/24/2022)   Overall Financial Resource Strain (CARDIA)    Difficulty of Paying Living Expenses: Not hard at all  Food Insecurity: No Food Insecurity (10/24/2022)   Hunger Vital Sign    Worried About Running Out of Food in the Last Year: Never true    Ran Out of Food in the Last Year: Never true  Transportation Needs: No Transportation Needs (10/24/2022)   PRAPARE - Administrator, Civil Service (Medical): No    Lack of Transportation (Non-Medical): No  Physical Activity: Sufficiently Active (10/24/2022)   Exercise Vital Sign    Days  of Exercise per Week: 7 days    Minutes of Exercise per Session: 40 min  Stress: No Stress Concern Present (10/24/2022)   [employer] of Occupational Health - Occupational Stress Questionnaire    Feeling of Stress : Not at all  Social Connections: Socially Integrated (10/24/2022)   Social Connection and Isolation Panel [NHANES]    Frequency of Communication with Friends and Family: More than three times a week    Frequency of Social Gatherings with Friends and Family: More than three times a week    Attends Religious Services: More than 4 times per year    Active Member of [employer] or Organizations: No    Attends Engineer, structural: More than 4 times per year    Marital Status: Married   No Known Allergies Family History  Problem Relation Age of Onset   Diverticulosis Mother    Stroke Mother        late 37s   Parkinsonism Mother    Diabetes Father    Hypertension Father    Heart disease Father 54       CABG   Hypertension Sister    Appendicitis Sister        ruptured   Cancer Brother        lymphoma   Appendicitis Brother        ruptured   Asthma Brother        childhood   Heart attack Maternal Grandmother 80       questionable   Heart attack Maternal Grandfather 57   Parkinsonism Maternal Grandfather    Stroke Paternal Grandmother 38   Colon cancer Neg Hx    Esophageal cancer Neg Hx    Rectal cancer Neg Hx    Stomach cancer Neg Hx    Colon polyps Neg Hx     Current Outpatient Medications (Endocrine & Metabolic):    empagliflozin  (JARDIANCE ) 25 MG TABS tablet, Take 1 tablet (25 mg total) by mouth daily before breakfast.  Current Outpatient Medications (Cardiovascular):    atorvastatin  (LIPITOR) 10 MG tablet, TAKE 1 TABLET EVERY OTHER DAY   metoprolol  succinate (TOPROL -XL) 50 MG 24 hr tablet, TAKE 1 TABLET ONCE DAILY. TAKE WITH OR IMMEDIATELY FOLLOWING A MEAL.   nitroGLYCERIN  (NITROSTAT ) 0.4 MG SL tablet, Place 1 tablet (0.4 mg total) under the tongue  every 5 (five) minutes as needed for chest pain.  Current Outpatient Medications (Respiratory):    albuterol  (VENTOLIN  HFA) 108 (90 Base) MCG/ACT inhaler, Inhale 2 puffs into the lungs every 6 (six) hours as needed (wheezing or cough).   azelastine (ASTELIN) 0.1 % nasal spray, Place into the nose.  Current Outpatient Medications (Analgesics):  aspirin 81 MG tablet, Take 81 mg by mouth daily.   naproxen sodium (ALEVE) 220 MG tablet, Take 220 mg by mouth daily as needed.  Current Outpatient Medications (Hematological):    Cyanocobalamin (VITAMIN B-12 PO), Take by mouth as directed.  Current Outpatient Medications (Other):    Cholecalciferol (VITAMIN D3) 1000 UNITS CAPS, Take 1 capsule by mouth daily.   Multiple Vitamin (MULTIVITAMIN) tablet, Take 1 tablet by mouth daily.   Omega-3 Fatty Acids (FISH OIL) 300 MG CAPS, Take by mouth.   trimethoprim -polymyxin b  (POLYTRIM ) ophthalmic solution,    Turmeric (QC TUMERIC COMPLEX PO), Take by mouth daily.   Reviewed prior external information including notes and imaging from  primary care provider As well as notes that were available from care everywhere and other healthcare systems.  Past medical history, social, surgical and family history all reviewed in electronic medical record.  No pertanent information unless stated regarding to the chief complaint.   Review of Systems:  No headache, visual changes, nausea, vomiting, diarrhea, constipation, dizziness, abdominal pain, skin rash, fevers, chills, night sweats, weight loss, swollen lymph nodes, body aches, chest pain, shortness of breath, mood changes. POSITIVE muscle aches, joint swelling  Objective  Blood pressure 128/82, pulse 70, height 5\' 9"  (1.753 m), weight 250 lb (113.4 kg), SpO2 98%.   General: No apparent distress alert and oriented x3 mood and affect normal, dressed appropriately.  HEENT: Pupils equal, extraocular movements intact  Respiratory: Patient's speak in full sentences  and does not appear short of breath  Left knee does have a trace effusion noted.  He does have some crepitus noted.  Tightness with range of motion noted.   After informed written and verbal consent, patient was seated on exam table. Left knee was prepped with alcohol swab and utilizing anterolateral approach, patient's left knee space was injected with 4:1  marcaine 0.5%: Kenalog  40mg /dL. Patient tolerated the procedure well without immediate complications.    Impression and Recommendations:    The above documentation has been reviewed and is accurate and complete Chayse Zatarain M Marceil Welp, DO

## 2023-09-19 ENCOUNTER — Telehealth: Payer: Self-pay

## 2023-09-19 ENCOUNTER — Encounter: Payer: Self-pay | Admitting: Family Medicine

## 2023-09-19 ENCOUNTER — Ambulatory Visit: Admitting: Family Medicine

## 2023-09-19 VITALS — BP 128/82 | HR 70 | Ht 69.0 in | Wt 250.0 lb

## 2023-09-19 DIAGNOSIS — M1712 Unilateral primary osteoarthritis, left knee: Secondary | ICD-10-CM

## 2023-09-19 NOTE — Patient Instructions (Addendum)
 Injection in knee today Will get you approved for visco See me again in 6 weeks

## 2023-09-19 NOTE — Telephone Encounter (Signed)
 Ran for monovisc for left knee 09/19/2023. Case #: [id number]

## 2023-09-19 NOTE — Assessment & Plan Note (Addendum)
 Patient is doing remarkably well he has been 3 years since previous injection.  Discussed icing regimen of home exercises, we will get approval for potential viscosupplementation which patient has done very well for multiple years each time.  Discussed icing regimen and home exercises, increase activity slowly otherwise.  Follow-up again in 6 to 8 weeks.

## 2023-10-04 NOTE — Telephone Encounter (Signed)
 Patient is scheduled for 11/13/23  MONOVISC authorized left knee Deductible does not apply Once the OOP has been met patient is covered at 100% Copay $15  PA NOT REQUIRED Case ID [id number]

## 2023-10-08 NOTE — Telephone Encounter (Signed)
 Noted

## 2023-10-31 ENCOUNTER — Ambulatory Visit: Payer: Medicare HMO

## 2023-11-04 ENCOUNTER — Ambulatory Visit (INDEPENDENT_AMBULATORY_CARE_PROVIDER_SITE_OTHER)

## 2023-11-04 VITALS — Ht 69.0 in | Wt 250.0 lb

## 2023-11-04 DIAGNOSIS — Z0000 Encounter for general adult medical examination without abnormal findings: Secondary | ICD-10-CM

## 2023-11-04 NOTE — Patient Instructions (Addendum)
 [name] , Thank you for taking time out of your busy schedule to complete your Annual Wellness Visit with me. I enjoyed our conversation and look forward to speaking with you again next year. I, as well as your care team,  appreciate your ongoing commitment to your health goals. Please review the following plan we discussed and let me know if I can assist you in the future. Your Game plan/ To Do List    Follow up Visits: Next Medicare AWV with our clinical staff: 11/04/2024.   Have you seen your provider in the last 6 months (3 months if uncontrolled diabetes)? Yes Next Office Visit with your provider: 12/17/2023.  Clinician Recommendations:  Aim for 30 minutes of exercise or brisk walking, 6-8 glasses of water, and 5 servings of fruits and vegetables each day. Keep up the good work.  Remember you are due for a foot exam and will get that done during your next office visit.      This is a list of the screening recommended for you and due dates:  Health Maintenance  Topic Date Due   Complete foot exam   10/29/2019   COVID-19 Vaccine (4 - 2024-25 season) 01/06/2023   Medicare Annual Wellness Visit  10/24/2023   Eye exam for diabetics  10/29/2023   Flu Shot  12/06/2023   Hemoglobin A1C  01/30/2024   Yearly kidney function blood test for diabetes  06/13/2024   Yearly kidney health urinalysis for diabetes  06/13/2024   DTaP/Tdap/Td vaccine (4 - Td or Tdap) 01/27/2033   Colon Cancer Screening  02/27/2033   Pneumococcal Vaccine for age over 55  Completed   Hepatitis C Screening  Completed   Zoster (Shingles) Vaccine  Completed   Hepatitis B Vaccine  Aged Out   HPV Vaccine  Aged Out   Meningitis B Vaccine  Aged Out    Advanced directives: (Copy Requested) Please bring a copy of your health care power of attorney and living will to the office to be added to your chart at your convenience. You can mail to [facility] [address] [place], [place] [address] or email to  [email or web address] Advance Care Planning is important because it:  [x]  Makes sure you receive the medical care that is consistent with your values, goals, and preferences  [x]  It provides guidance to your family and loved ones and reduces their decisional burden about whether or not they are making the right decisions based on your wishes.  Follow the link provided in your after visit summary or read over the paperwork we have mailed to you to help you started getting your Advance Directives in place. If you need assistance in completing these, please reach out to us  so that we can help you!  See attachments for Preventive Care and Fall Prevention Tips.

## 2023-11-04 NOTE — Progress Notes (Signed)
 Subjective:   Edwin [name] is a 78 y.o. who presents for a Medicare Wellness preventive visit.  As a reminder, Annual Wellness Visits don't include a physical exam, and some assessments may be limited, especially if this visit is performed virtually. We may recommend an in-person follow-up visit with your provider if needed.  Visit Complete: Virtual I connected with  Edwin [name] on 11/04/23 by a audio enabled telemedicine application and verified that I am speaking with the correct person using two identifiers.  Patient Location: Home  Provider Location: Office/Clinic  I discussed the limitations of evaluation and management by telemedicine. The patient expressed understanding and agreed to proceed.  Vital Signs: Because this visit was a virtual/telehealth visit, some criteria may be missing or patient reported. Any vitals not documented were not able to be obtained and vitals that have been documented are patient reported.  VideoDeclined- This patient declined Librarian, academic. Therefore the visit was completed with audio only.  Persons Participating in Visit: Patient.  AWV Questionnaire: No: Patient Medicare AWV questionnaire was not completed prior to this visit.  Cardiac Risk Factors include: advanced age (>37men, >31 women);male gender;hypertension;diabetes mellitus;dyslipidemia     Objective:    Today's Vitals   11/04/23 1011  Weight: 250 lb (113.4 kg)  Height: 5' 9 (1.753 m)   Body mass index is 36.92 kg/m.     11/04/2023   10:50 AM 10/24/2022   10:35 AM 10/27/2021   11:04 AM 07/11/2017    9:18 AM  Advanced Directives  Does Patient Have a Medical Advance Directive? Yes Yes Yes Yes   Type of Estate agent of [place];Living will Healthcare Power of [place];Living will Living will;Healthcare Power of [employer] Power of [place];Living will  Does patient want to make changes to medical advance  directive?   No - Patient declined   Copy of Healthcare Power of Attorney in Chart? No - copy requested No - copy requested No - copy requested No - copy requested      Data saved with a previous flowsheet row definition    Current Medications (verified) Outpatient Encounter Medications as of 11/04/2023  Medication Sig   albuterol  (VENTOLIN  HFA) 108 (90 Base) MCG/ACT inhaler Inhale 2 puffs into the lungs every 6 (six) hours as needed (wheezing or cough).   aspirin 81 MG tablet Take 81 mg by mouth daily.   atorvastatin  (LIPITOR) 10 MG tablet TAKE 1 TABLET EVERY OTHER DAY   azelastine (ASTELIN) 0.1 % nasal spray Place into the nose.   Cholecalciferol (VITAMIN D3) 1000 UNITS CAPS Take 1 capsule by mouth daily.   Cyanocobalamin (VITAMIN B-12 PO) Take by mouth as directed.   empagliflozin  (JARDIANCE ) 25 MG TABS tablet Take 1 tablet (25 mg total) by mouth daily before breakfast.   metoprolol  succinate (TOPROL -XL) 50 MG 24 hr tablet TAKE 1 TABLET ONCE DAILY. TAKE WITH OR IMMEDIATELY FOLLOWING A MEAL.   Multiple Vitamin (MULTIVITAMIN) tablet Take 1 tablet by mouth daily.   naproxen sodium (ALEVE) 220 MG tablet Take 220 mg by mouth daily as needed.   nitroGLYCERIN  (NITROSTAT ) 0.4 MG SL tablet Place 1 tablet (0.4 mg total) under the tongue every 5 (five) minutes as needed for chest pain.   Omega-3 Fatty Acids (FISH OIL) 300 MG CAPS Take by mouth.   trimethoprim -polymyxin b  (POLYTRIM ) ophthalmic solution    Turmeric (QC TUMERIC COMPLEX PO) Take by mouth daily.   No facility-administered encounter medications on file as of 11/04/2023.  Allergies (verified) Patient has no known allergies.   History: Past Medical History:  Diagnosis Date   BPH (benign prostatic hypertrophy)    Cancer (HCC)    Basal cell of face, Dr. Rolan Molt   Cataract    Diverticulosis    Bertrum syndrome    Hyperglycemia    Hyperlipidemia    NMR Lipoprofile 2005; LDL 146 (2007/1330), HDL 38, TG 169.  LDL goal = <100;  [place] Study LDL goal =<130   Hypertension    OSA on CPAP    Preproliferative diabetic retinopathy (HCC)    Sleep apnea    Tubular adenoma of colon 01/19/2008   Past Surgical History:  Procedure Laterality Date   catract removal Bilateral    COLONOSCOPY  05/07/2012   neg; [facility]   COLONOSCOPY W/ POLYPECTOMY  2004 & 2009   Tics; [facility]   KNEE ARTHROSCOPY     [name], left knee   TONSILLECTOMY     Torn meniscus  Right    WISDOM TOOTH EXTRACTION     Family History  Problem Relation Age of Onset   Diverticulosis Mother    Stroke Mother        late 29s   Parkinsonism Mother    Diabetes Father    Hypertension Father    Heart disease Father 53       CABG   Hypertension Sister    Appendicitis Sister        ruptured   Cancer Brother        lymphoma   Appendicitis Brother        ruptured   Asthma Brother        childhood   Heart attack Maternal Grandmother 80       questionable   Heart attack Maternal Grandfather 76   Parkinsonism Maternal Grandfather    Stroke Paternal Grandmother 28   Colon cancer Neg Hx    Esophageal cancer Neg Hx    Rectal cancer Neg Hx    Stomach cancer Neg Hx    Colon polyps Neg Hx    Social History   Socioeconomic History   Marital status: Married    Spouse name: Educational psychologist   Number of children: 2   Years of education: 18   Highest education level: Not on file  Occupational History   Occupation: Gaffer // Retired    Associate Professor: [employer]   Tobacco Use   Smoking status: Never   Smokeless tobacco: Never  Vaping Use   Vaping status: Never Used  Substance and Sexual Activity   Alcohol use: Yes    Alcohol/week: 0.0 standard drinks of alcohol    Comment: socially, 3-5 beers/week   Drug use: No   Sexual activity: Yes  Other Topics Concern   Not on file  Social History Narrative   Fun: Travel, read, movies, concerts    Denies abuse and feels safe at home.    Social Drivers of Manufacturing engineer Strain: Low Risk  (11/04/2023)   Overall Financial Resource Strain (CARDIA)    Difficulty of Paying Living Expenses: Not hard at all  Food Insecurity: No Food Insecurity (11/04/2023)   Hunger Vital Sign    Worried About Running Out of Food in the Last Year: Never true    Ran Out of Food in the Last Year: Never true  Transportation Needs: No Transportation Needs (11/04/2023)   PRAPARE - Administrator, Civil Service (Medical): No  Lack of Transportation (Non-Medical): No  Physical Activity: Unknown (11/04/2023)   Exercise Vital Sign    Days of Exercise per Week: Not on file    Minutes of Exercise per Session: 40 min  Stress: No Stress Concern Present (11/04/2023)   [employer] of Occupational Health - Occupational Stress Questionnaire    Feeling of Stress: Not at all  Social Connections: Socially Integrated (11/04/2023)   Social Connection and Isolation Panel    Frequency of Communication with Friends and Family: More than three times a week    Frequency of Social Gatherings with Friends and Family: More than three times a week    Attends Religious Services: More than 4 times per year    Active Member of [employer] or Organizations: Yes    Attends Banker Meetings: Never    Marital Status: Married    Tobacco Counseling Counseling given: Not Answered    Clinical Intake:  Pre-visit preparation completed: Yes  Pain : No/denies pain     BMI - recorded: 36.92 Nutritional Status: BMI > 30  Obese Nutritional Risks: None Diabetes: No  Lab Results  Component Value Date   HGBA1C 7.3 (A) 07/30/2023   HGBA1C 8.2 (H) 06/14/2023   HGBA1C 10.1 (H) 01/29/2023     How often do you need to have someone help you when you read instructions, pamphlets, or other written materials from your doctor or pharmacy?: 1 - Never  Interpreter Needed?: No  Information entered by :: Mykaela Arena, RMA   Activities of Daily Living     11/04/2023    10:48 AM  In your present state of health, do you have any difficulty performing the following activities:  Hearing? 0  Vision? 0  Difficulty concentrating or making decisions? 0  Walking or climbing stairs? 0  Dressing or bathing? 0  Doing errands, shopping? 0  Preparing Food and eating ? N  Using the Toilet? N  In the past six months, have you accidently leaked urine? N  Do you have problems with loss of bowel control? N  Managing your Medications? N  Managing your Finances? N  Housekeeping or managing your Housekeeping? N    Patient Care Team: Geofm Glade PARAS, MD as PCP - General (Internal Medicine) Delford Maude BROCKS, MD as PCP - Cardiology (Cardiology) Lavonia Lye, MD as Consulting Physician (Ophthalmology) [facility] [place] as Consulting Physician (Optometry)  I have updated your Care Teams any recent Medical Services you may have received from other providers in the past year.     Assessment:   This is a routine wellness examination for Edwin [name].  Hearing/Vision screen Hearing Screening - Comments:: Denies hearing difficulties   Vision Screening - Comments:: Wears eyeglasses/ [place]   Goals Addressed   None    Depression Screen     11/04/2023   10:52 AM 07/30/2023    9:54 AM 06/14/2023    8:57 AM 01/28/2023   10:25 AM 10/24/2022   10:36 AM 08/21/2022    9:32 AM 10/27/2021   11:08 AM  PHQ 2/9 Scores  PHQ - 2 Score 0 0 0 0 0 0 0  PHQ- 9 Score 0 0  0 0 0     Fall Risk     11/04/2023   10:50 AM 07/30/2023    9:54 AM 06/14/2023    8:57 AM 01/28/2023   10:24 AM 10/24/2022   10:36 AM  Fall Risk   Falls in the past year? 0 0 0 1 0  Number falls in past yr: 0 0 0 0 0  Injury with Fall? 0 0 0 0 0  Risk for fall due to :  No Fall Risks No Fall Risks No Fall Risks No Fall Risks  Follow up Falls evaluation completed;Falls prevention discussed Falls evaluation completed Falls evaluation completed Falls evaluation completed Falls prevention discussed    MEDICARE  RISK AT HOME:  Medicare Risk at Home Any stairs in or around the home?: Yes If so, are there any without handrails?: No Home free of loose throw rugs in walkways, pet beds, electrical cords, etc?: Yes Adequate lighting in your home to reduce risk of falls?: Yes Life alert?: No Use of a cane, walker or w/c?: No Grab bars in the bathroom?: No Shower chair or bench in shower?: No Elevated toilet seat or a handicapped toilet?: No  TIMED UP AND GO:  Was the test performed?  No  Cognitive Function: Declined/Normal: No cognitive concerns noted by patient or family. Patient alert, oriented, able to answer questions appropriately and recall recent events. No signs of memory loss or confusion.        10/24/2022   10:38 AM 10/27/2021   11:17 AM  6CIT Screen  What Year? 0 points 0 points  What month? 0 points 0 points  What time? 0 points 0 points  Count back from 20 0 points 0 points  Months in reverse 0 points 0 points  Repeat phrase 0 points 0 points  Total Score 0 points 0 points    Immunizations Immunization History  Administered Date(s) Administered   Fluad Quad(high Dose 65+) 01/01/2019, 04/20/2020   Fluad Trivalent(High Dose 65+) 01/28/2023   Influenza, High Dose Seasonal PF 05/22/2013, 01/16/2017, 02/27/2018   Influenza, Seasonal, Injecte, Preservative Fre 05/20/2012   Influenza,inj,Quad PF,6+ Mos 03/02/2014, 05/19/2015   Influenza-Unspecified 01/16/2017, 03/28/2021   PFIZER(Purple Top)SARS-COV-2 Vaccination 05/27/2019, 06/17/2019, 02/08/2020   Pneumococcal Conjugate-13 06/07/2015   Pneumococcal Polysaccharide-23 04/13/2014   Respiratory Syncytial Virus Vaccine,Recomb Aduvanted(Arexvy) 02/28/2022   Td 05/11/2010, 01/28/2023   Tdap 08/05/2012   Zoster Recombinant(Shingrix) 01/16/2017, 07/01/2017   Zoster, Live 12/23/2007    Screening Tests Health Maintenance  Topic Date Due   FOOT EXAM  10/29/2019   COVID-19 Vaccine (4 - 2024-25 season) 01/06/2023   Medicare Annual  Wellness (AWV)  10/24/2023   OPHTHALMOLOGY EXAM  10/29/2023   INFLUENZA VACCINE  12/06/2023   HEMOGLOBIN A1C  01/30/2024   Diabetic kidney evaluation - eGFR measurement  06/13/2024   Diabetic kidney evaluation - Urine ACR  06/13/2024   DTaP/Tdap/Td (4 - Td or Tdap) 01/27/2033   Colonoscopy  02/27/2033   Pneumococcal Vaccine: 50+ Years  Completed   Hepatitis C Screening  Completed   Zoster Vaccines- Shingrix  Completed   Hepatitis B Vaccines  Aged Out   HPV VACCINES  Aged Out   Meningococcal B Vaccine  Aged Out    Health Maintenance  Health Maintenance Due  Topic Date Due   FOOT EXAM  10/29/2019   COVID-19 Vaccine (4 - 2024-25 season) 01/06/2023   Medicare Annual Wellness (AWV)  10/24/2023   OPHTHALMOLOGY EXAM  10/29/2023   Health Maintenance Items Addressed: Diabetic Foot Exam recommended, See Nurse Notes at the end of this note  Additional Screening:  Vision Screening: Recommended annual ophthalmology exams for early detection of glaucoma and other disorders of the eye. Would you like a referral to an eye doctor? No    Dental Screening: Recommended annual dental exams for proper oral hygiene  Community  Resource Referral / Chronic Care Management: CRR required this visit?  No   CCM required this visit?  No   Plan:    I have personally reviewed and noted the following in the patient's chart:   Medical and social history Use of alcohol, tobacco or illicit drugs  Current medications and supplements including opioid prescriptions. Patient is not currently taking opioid prescriptions. Functional ability and status Nutritional status Physical activity Advanced directives List of other physicians Hospitalizations, surgeries, and ER visits in previous 12 months Vitals Screenings to include cognitive, depression, and falls Referrals and appointments  In addition, I have reviewed and discussed with patient certain preventive protocols, quality metrics, and best  practice recommendations. A written personalized care plan for preventive services as well as general preventive health recommendations were provided to patient.   Shalyn Koral L Chen Holzman, CMA   11/04/2023   After Visit Summary: (MyChart) Due to this being a telephonic visit, the after visit summary with patients personalized plan was offered to patient via MyChart   Notes: Patient is due for a diabetic eye exam, which he stated that he has one in the next week or so.  He is also due for a foot exam and can get that during his next office visit with Dr. Geofm.  Patient had no other concerns to address today.

## 2023-11-07 NOTE — Progress Notes (Unsigned)
 [name] [address] [place] [address] Phone: [phone] Subjective:    I'm seeing this patient by the request  of:  Geofm Glade PARAS, MD  CC:   YEP:Dlagzrupcz  09/19/2023 Patient is doing remarkably well he has been 3 years since previous injection.  Discussed icing regimen of home exercises, we will get approval for potential viscosupplementation which patient has done very well for multiple years each time.  Discussed icing regimen and home exercises, increase activity slowly otherwise.  Follow-up again in 6 to 8 weeks.     Updated 11/13/2023 Edwin [name] is a 78 y.o. male coming in with complaint of L knee pain       Past Medical History:  Diagnosis Date   BPH (benign prostatic hypertrophy)    Cancer (HCC)    Basal cell of face, Dr. Rolan Molt   Cataract    Diverticulosis    Bertrum syndrome    Hyperglycemia    Hyperlipidemia    NMR Lipoprofile 2005; LDL 146 (2007/1330), HDL 38, TG 169.  LDL goal = <100; [place] Study LDL goal =<130   Hypertension    OSA on CPAP    Preproliferative diabetic retinopathy (HCC)    Sleep apnea    Tubular adenoma of colon 01/19/2008   Past Surgical History:  Procedure Laterality Date   catract removal Bilateral    COLONOSCOPY  05/07/2012   neg; [facility]   COLONOSCOPY W/ POLYPECTOMY  2004 & 2009   Tics; [facility]   KNEE ARTHROSCOPY     [name], left knee   TONSILLECTOMY     Torn meniscus  Right    WISDOM TOOTH EXTRACTION     Social History   Socioeconomic History   Marital status: Married    Spouse name: Educational psychologist   Number of children: 2   Years of education: 18   Highest education level: Not on file  Occupational History   Occupation: Gaffer // Retired    Associate Professor: [employer]   Tobacco Use   Smoking status: Never   Smokeless tobacco: Never  Vaping Use   Vaping status: Never Used  Substance and Sexual Activity   Alcohol use: Yes     Alcohol/week: 0.0 standard drinks of alcohol    Comment: socially, 3-5 beers/week   Drug use: No   Sexual activity: Yes  Other Topics Concern   Not on file  Social History Narrative   Fun: Travel, read, movies, concerts    Denies abuse and feels safe at home.    Social Drivers of Corporate investment banker Strain: Low Risk  (11/04/2023)   Overall Financial Resource Strain (CARDIA)    Difficulty of Paying Living Expenses: Not hard at all  Food Insecurity: No Food Insecurity (11/04/2023)   Hunger Vital Sign    Worried About Running Out of Food in the Last Year: Never true    Ran Out of Food in the Last Year: Never true  Transportation Needs: No Transportation Needs (11/04/2023)   PRAPARE - Administrator, Civil Service (Medical): No    Lack of Transportation (Non-Medical): No  Physical Activity: Unknown (11/04/2023)   Exercise Vital Sign    Days of Exercise per Week: Not on file    Minutes of Exercise per Session: 40 min  Stress: No Stress Concern Present (11/04/2023)   [employer] of Occupational Health - Occupational Stress Questionnaire    Feeling of Stress: Not at  all  Social Connections: Socially Integrated (11/04/2023)   Social Connection and Isolation Panel    Frequency of Communication with Friends and Family: More than three times a week    Frequency of Social Gatherings with Friends and Family: More than three times a week    Attends Religious Services: More than 4 times per year    Active Member of [employer] or Organizations: Yes    Attends Banker Meetings: Never    Marital Status: Married   No Known Allergies Family History  Problem Relation Age of Onset   Diverticulosis Mother    Stroke Mother        late 5s   Parkinsonism Mother    Diabetes Father    Hypertension Father    Heart disease Father 13       CABG   Hypertension Sister    Appendicitis Sister        ruptured   Cancer Brother        lymphoma   Appendicitis Brother         ruptured   Asthma Brother        childhood   Heart attack Maternal Grandmother 80       questionable   Heart attack Maternal Grandfather 78   Parkinsonism Maternal Grandfather    Stroke Paternal Grandmother 43   Colon cancer Neg Hx    Esophageal cancer Neg Hx    Rectal cancer Neg Hx    Stomach cancer Neg Hx    Colon polyps Neg Hx     Current Outpatient Medications (Endocrine & Metabolic):    empagliflozin  (JARDIANCE ) 25 MG TABS tablet, Take 1 tablet (25 mg total) by mouth daily before breakfast.  Current Outpatient Medications (Cardiovascular):    atorvastatin  (LIPITOR) 10 MG tablet, TAKE 1 TABLET EVERY OTHER DAY   metoprolol  succinate (TOPROL -XL) 50 MG 24 hr tablet, TAKE 1 TABLET ONCE DAILY. TAKE WITH OR IMMEDIATELY FOLLOWING A MEAL.   nitroGLYCERIN  (NITROSTAT ) 0.4 MG SL tablet, Place 1 tablet (0.4 mg total) under the tongue every 5 (five) minutes as needed for chest pain.  Current Outpatient Medications (Respiratory):    albuterol  (VENTOLIN  HFA) 108 (90 Base) MCG/ACT inhaler, Inhale 2 puffs into the lungs every 6 (six) hours as needed (wheezing or cough).   azelastine (ASTELIN) 0.1 % nasal spray, Place into the nose.  Current Outpatient Medications (Analgesics):    aspirin 81 MG tablet, Take 81 mg by mouth daily.   naproxen sodium (ALEVE) 220 MG tablet, Take 220 mg by mouth daily as needed.  Current Outpatient Medications (Hematological):    Cyanocobalamin (VITAMIN B-12 PO), Take by mouth as directed.  Current Outpatient Medications (Other):    Cholecalciferol (VITAMIN D3) 1000 UNITS CAPS, Take 1 capsule by mouth daily.   Multiple Vitamin (MULTIVITAMIN) tablet, Take 1 tablet by mouth daily.   Omega-3 Fatty Acids (FISH OIL) 300 MG CAPS, Take by mouth.   trimethoprim -polymyxin b  (POLYTRIM ) ophthalmic solution,    Turmeric (QC TUMERIC COMPLEX PO), Take by mouth daily.   Reviewed prior external information including notes and imaging from  primary care provider As well as  notes that were available from care everywhere and other healthcare systems.  Past medical history, social, surgical and family history all reviewed in electronic medical record.  No pertanent information unless stated regarding to the chief complaint.   Review of Systems:  No headache, visual changes, nausea, vomiting, diarrhea, constipation, dizziness, abdominal pain, skin rash, fevers, chills, night sweats, weight loss,  swollen lymph nodes, body aches, joint swelling, chest pain, shortness of breath, mood changes. POSITIVE muscle aches  Objective  There were no vitals taken for this visit.   General: No apparent distress alert and oriented x3 mood and affect normal, dressed appropriately.  HEENT: Pupils equal, extraocular movements intact  Respiratory: Patient's speak in full sentences and does not appear short of breath  Cardiovascular: No lower extremity edema, non tender, no erythema      Impression and Recommendations:

## 2023-11-11 DIAGNOSIS — N401 Enlarged prostate with lower urinary tract symptoms: Secondary | ICD-10-CM | POA: Diagnosis not present

## 2023-11-13 ENCOUNTER — Ambulatory Visit: Admitting: Family Medicine

## 2023-11-13 ENCOUNTER — Encounter: Payer: Self-pay | Admitting: Family Medicine

## 2023-11-13 VITALS — BP 122/72 | HR 65 | Ht 69.0 in | Wt 249.0 lb

## 2023-11-13 DIAGNOSIS — M1712 Unilateral primary osteoarthritis, left knee: Secondary | ICD-10-CM

## 2023-11-13 DIAGNOSIS — R2689 Other abnormalities of gait and mobility: Secondary | ICD-10-CM | POA: Diagnosis not present

## 2023-11-13 MED ORDER — HYALURONAN 88 MG/4ML IX SOSY
88.0000 mg | PREFILLED_SYRINGE | Freq: Once | INTRA_ARTICULAR | Status: AC
  Administered 2023-11-13: 88 mg via INTRA_ARTICULAR

## 2023-11-13 NOTE — Patient Instructions (Signed)
 Good to see you! PT with [facility] See you again in 3 months

## 2023-11-13 NOTE — Assessment & Plan Note (Signed)
 Patient did remarkable with the last viscosupplementation and hopeful that we will get a long duration of improvement again.  Would like to start patient also in formal physical therapy for this as well as balance and coordination.  Patient is trying to stay more active.  Discussed icing regimen of home exercises, increase activity slowly.  Follow-up with me again in 3 to 4 months

## 2023-11-14 DIAGNOSIS — E119 Type 2 diabetes mellitus without complications: Secondary | ICD-10-CM | POA: Diagnosis not present

## 2023-11-18 DIAGNOSIS — B356 Tinea cruris: Secondary | ICD-10-CM | POA: Diagnosis not present

## 2023-11-18 DIAGNOSIS — R972 Elevated prostate specific antigen [PSA]: Secondary | ICD-10-CM | POA: Diagnosis not present

## 2023-11-18 DIAGNOSIS — R351 Nocturia: Secondary | ICD-10-CM | POA: Diagnosis not present

## 2023-11-18 DIAGNOSIS — N5201 Erectile dysfunction due to arterial insufficiency: Secondary | ICD-10-CM | POA: Diagnosis not present

## 2023-11-18 DIAGNOSIS — N401 Enlarged prostate with lower urinary tract symptoms: Secondary | ICD-10-CM | POA: Diagnosis not present

## 2023-11-19 DIAGNOSIS — L905 Scar conditions and fibrosis of skin: Secondary | ICD-10-CM | POA: Diagnosis not present

## 2023-11-19 DIAGNOSIS — L570 Actinic keratosis: Secondary | ICD-10-CM | POA: Diagnosis not present

## 2023-11-19 DIAGNOSIS — D225 Melanocytic nevi of trunk: Secondary | ICD-10-CM | POA: Diagnosis not present

## 2023-11-19 DIAGNOSIS — L814 Other melanin hyperpigmentation: Secondary | ICD-10-CM | POA: Diagnosis not present

## 2023-11-19 DIAGNOSIS — Z85828 Personal history of other malignant neoplasm of skin: Secondary | ICD-10-CM | POA: Diagnosis not present

## 2023-11-19 DIAGNOSIS — D2262 Melanocytic nevi of left upper limb, including shoulder: Secondary | ICD-10-CM | POA: Diagnosis not present

## 2023-11-19 DIAGNOSIS — D2261 Melanocytic nevi of right upper limb, including shoulder: Secondary | ICD-10-CM | POA: Diagnosis not present

## 2023-11-19 DIAGNOSIS — D3612 Benign neoplasm of peripheral nerves and autonomic nervous system, upper limb, including shoulder: Secondary | ICD-10-CM | POA: Diagnosis not present

## 2023-11-19 DIAGNOSIS — L821 Other seborrheic keratosis: Secondary | ICD-10-CM | POA: Diagnosis not present

## 2023-11-21 DIAGNOSIS — R2681 Unsteadiness on feet: Secondary | ICD-10-CM | POA: Diagnosis not present

## 2023-11-21 DIAGNOSIS — M546 Pain in thoracic spine: Secondary | ICD-10-CM | POA: Diagnosis not present

## 2023-11-21 DIAGNOSIS — M25519 Pain in unspecified shoulder: Secondary | ICD-10-CM | POA: Diagnosis not present

## 2023-11-21 DIAGNOSIS — M25569 Pain in unspecified knee: Secondary | ICD-10-CM | POA: Diagnosis not present

## 2023-11-26 DIAGNOSIS — M25519 Pain in unspecified shoulder: Secondary | ICD-10-CM | POA: Diagnosis not present

## 2023-11-26 DIAGNOSIS — M25569 Pain in unspecified knee: Secondary | ICD-10-CM | POA: Diagnosis not present

## 2023-11-26 DIAGNOSIS — M546 Pain in thoracic spine: Secondary | ICD-10-CM | POA: Diagnosis not present

## 2023-11-26 DIAGNOSIS — R2681 Unsteadiness on feet: Secondary | ICD-10-CM | POA: Diagnosis not present

## 2023-12-10 ENCOUNTER — Encounter: Payer: Self-pay | Admitting: Internal Medicine

## 2023-12-10 DIAGNOSIS — M25519 Pain in unspecified shoulder: Secondary | ICD-10-CM | POA: Diagnosis not present

## 2023-12-10 DIAGNOSIS — M546 Pain in thoracic spine: Secondary | ICD-10-CM | POA: Diagnosis not present

## 2023-12-10 DIAGNOSIS — M25569 Pain in unspecified knee: Secondary | ICD-10-CM | POA: Diagnosis not present

## 2023-12-10 DIAGNOSIS — R2681 Unsteadiness on feet: Secondary | ICD-10-CM | POA: Diagnosis not present

## 2023-12-10 NOTE — Progress Notes (Unsigned)
 Subjective:    Patient ID: Edwin [name], male    DOB: [birth year], 78 y.o.   MRN: [id number]     HPI Edwin [name] is here for a physical exam and his chronic medical problems.   Doing well.  No concerns.   Doing PT at his gym for balance and flexibility.  Has jock itch - uro was going to sent something in but it was not approved..   Medications and allergies reviewed with patient and updated if appropriate.  Current Outpatient Medications on File Prior to Visit  Medication Sig Dispense Refill   albuterol  (VENTOLIN  HFA) 108 (90 Base) MCG/ACT inhaler Inhale 2 puffs into the lungs every 6 (six) hours as needed (wheezing or cough). 8 g 0   aspirin 81 MG tablet Take 81 mg by mouth daily.     atorvastatin  (LIPITOR) 10 MG tablet TAKE 1 TABLET EVERY OTHER DAY 45 tablet 3   azelastine (ASTELIN) 0.1 % nasal spray Place into the nose.     Cholecalciferol (VITAMIN D3) 1000 UNITS CAPS Take 1 capsule by mouth daily.     Cyanocobalamin (VITAMIN B-12 PO) Take by mouth as directed.     empagliflozin  (JARDIANCE ) 25 MG TABS tablet Take 1 tablet (25 mg total) by mouth daily before breakfast. 90 tablet 1   metoprolol  succinate (TOPROL -XL) 50 MG 24 hr tablet TAKE 1 TABLET ONCE DAILY. TAKE WITH OR IMMEDIATELY FOLLOWING A MEAL. 90 tablet 3   Multiple Vitamin (MULTIVITAMIN) tablet Take 1 tablet by mouth daily.     naproxen sodium (ALEVE) 220 MG tablet Take 220 mg by mouth daily as needed.     nitroGLYCERIN  (NITROSTAT ) 0.4 MG SL tablet Place 1 tablet (0.4 mg total) under the tongue every 5 (five) minutes as needed for chest pain. 25 tablet 3   Omega-3 Fatty Acids (FISH OIL) 300 MG CAPS Take by mouth.     trimethoprim -polymyxin b  (POLYTRIM ) ophthalmic solution      Turmeric (QC TUMERIC COMPLEX PO) Take by mouth daily.     No current facility-administered medications on file prior to visit.    Review of Systems  Constitutional:  Negative for fever.  Eyes:  Positive for visual disturbance (left eye  blurriness - to have laser to remove scarring from cataract).  Respiratory:  Negative for cough, shortness of breath and wheezing.   Cardiovascular:  Negative for chest pain, palpitations and leg swelling.  Gastrointestinal:  Negative for abdominal pain, blood in stool, constipation and diarrhea.       No gerd  Genitourinary:  Negative for difficulty urinating and dysuria.  Musculoskeletal:  Positive for arthralgias (left knee oa). Negative for back pain.  Skin:  Negative for rash.  Neurological:  Negative for light-headedness and headaches.  Psychiatric/Behavioral:  Negative for dysphoric mood. The patient is not nervous/anxious.        Objective:   Vitals:   12/11/23 1406  BP: 122/78  Pulse: 72  Temp: 97.9 F (36.6 C)  SpO2: 97%   Filed Weights   12/11/23 1406  Weight: 246 lb (111.6 kg)   Body mass index is 36.33 kg/m.  BP Readings from Last 3 Encounters:  12/11/23 122/78  11/13/23 122/72  09/19/23 128/82    Wt Readings from Last 3 Encounters:  12/11/23 246 lb (111.6 kg)  11/13/23 249 lb (112.9 kg)  11/04/23 250 lb (113.4 kg)      Physical Exam Constitutional: He appears well-developed and well-nourished. No distress.  HENT:  Head: Normocephalic and atraumatic.  Right Ear: External ear normal.  Left Ear: External ear normal.  Normal ear canals and TM b/l  Mouth/Throat: Oropharynx is clear and moist. Eyes: Conjunctivae and EOM are normal.  Neck: Neck supple. No tracheal deviation present. No thyromegaly present.  No carotid bruit  Cardiovascular: Normal rate, regular rhythm, normal heart sounds and intact distal pulses.   No murmur heard.  No lower extremity edema. Pulmonary/Chest: Effort normal and breath sounds normal. No respiratory distress. He has no wheezes. He has no rales.  Abdominal: Soft. He exhibits no distension. There is no tenderness.  Genitourinary: deferred  Lymphadenopathy:   He has no cervical adenopathy.  Skin: Skin is warm and dry. He  is not diaphoretic.  Psychiatric: He has a normal mood and affect. His behavior is normal.         Assessment & Plan:   Physical exam: Screening blood work  ordered Exercise   regular, dong PT at [employer] - flexibility and balance Weight obese-working on weight loss Substance abuse   none   Reviewed recommended immunizations.   Health Maintenance  Topic Date Due   FOOT EXAM  10/29/2019   COVID-19 Vaccine (4 - 2024-25 season) 01/06/2023   OPHTHALMOLOGY EXAM  10/29/2023   INFLUENZA VACCINE  12/06/2023   HEMOGLOBIN A1C  01/30/2024   Diabetic kidney evaluation - eGFR measurement  06/13/2024   Diabetic kidney evaluation - Urine ACR  06/13/2024   Medicare Annual Wellness (AWV)  11/03/2024   DTaP/Tdap/Td (4 - Td or Tdap) 01/27/2033   Colonoscopy  02/27/2033   Pneumococcal Vaccine: 50+ Years  Completed   Hepatitis C Screening  Completed   Zoster Vaccines- Shingrix  Completed   Hepatitis B Vaccines  Aged Out   HPV VACCINES  Aged Out   Meningococcal B Vaccine  Aged Out     See Problem List for Assessment and Plan of chronic medical problems.

## 2023-12-10 NOTE — Patient Instructions (Addendum)
 Blood work was ordered.       Medications changes include :   diflucan  every 3 days for jock itch     Return in about 6 months (around 06/12/2024) for follow up.    Health Maintenance, Male Adopting a healthy lifestyle and getting preventive care are important in promoting health and wellness. Ask your health care provider about: The right schedule for you to have regular tests and exams. Things you can do on your own to prevent diseases and keep yourself healthy. What should I know about diet, weight, and exercise? Eat a healthy diet  Eat a diet that includes plenty of vegetables, fruits, low-fat dairy products, and lean protein. Do not eat a lot of foods that are high in solid fats, added sugars, or sodium. Maintain a healthy weight Body mass index (BMI) is a measurement that can be used to identify possible weight problems. It estimates body fat based on height and weight. Your health care provider can help determine your BMI and help you achieve or maintain a healthy weight. Get regular exercise Get regular exercise. This is one of the most important things you can do for your health. Most adults should: Exercise for at least 150 minutes each week. The exercise should increase your heart rate and make you sweat (moderate-intensity exercise). Do strengthening exercises at least twice a week. This is in addition to the moderate-intensity exercise. Spend less time sitting. Even light physical activity can be beneficial. Watch cholesterol and blood lipids Have your blood tested for lipids and cholesterol at 76 years of age, then have this test every 5 years. You may need to have your cholesterol levels checked more often if: Your lipid or cholesterol levels are high. You are older than 76 years of age. You are at high risk for heart disease. What should I know about cancer screening? Many types of cancers can be detected early and may often be prevented. Depending on your  health history and family history, you may need to have cancer screening at various ages. This may include screening for: Colorectal cancer. Prostate cancer. Skin cancer. Lung cancer. What should I know about heart disease, diabetes, and high blood pressure? Blood pressure and heart disease High blood pressure causes heart disease and increases the risk of stroke. This is more likely to develop in people who have high blood pressure readings or are overweight. Talk with your health care provider about your target blood pressure readings. Have your blood pressure checked: Every 3-5 years if you are 80-59 years of age. Every year if you are 21 years old or older. If you are between the ages of 82 and 48 and are a current or former smoker, ask your health care provider if you should have a one-time screening for abdominal aortic aneurysm (AAA). Diabetes Have regular diabetes screenings. This checks your fasting blood sugar level. Have the screening done: Once every three years after age 49 if you are at a normal weight and have a low risk for diabetes. More often and at a younger age if you are overweight or have a high risk for diabetes. What should I know about preventing infection? Hepatitis B If you have a higher risk for hepatitis B, you should be screened for this virus. Talk with your health care provider to find out if you are at risk for hepatitis B infection. Hepatitis C Blood testing is recommended for: Everyone born from [birth year] through 1963. Anyone with known  risk factors for hepatitis C. Sexually transmitted infections (STIs) You should be screened each year for STIs, including gonorrhea and chlamydia, if: You are sexually active and are younger than 76 years of age. You are older than 76 years of age and your health care provider tells you that you are at risk for this type of infection. Your sexual activity has changed since you were last screened, and you are at increased risk  for chlamydia or gonorrhea. Ask your health care provider if you are at risk. Ask your health care provider about whether you are at high risk for HIV. Your health care provider may recommend a prescription medicine to help prevent HIV infection. If you choose to take medicine to prevent HIV, you should first get tested for HIV. You should then be tested every 3 months for as long as you are taking the medicine. Follow these instructions at home: Alcohol use Do not drink alcohol if your health care provider tells you not to drink. If you drink alcohol: Limit how much you have to 0-2 drinks a day. Know how much alcohol is in your drink. In the [place] one drink equals one 12 oz bottle of beer (355 mL), one 5 oz glass of wine (148 mL), or one 1 oz glass of hard liquor (44 mL). Lifestyle Do not use any products that contain nicotine or tobacco. These products include cigarettes, chewing tobacco, and vaping devices, such as e-cigarettes. If you need help quitting, ask your health care provider. Do not use street drugs. Do not share needles. Ask your health care provider for help if you need support or information about quitting drugs. General instructions Schedule regular health, dental, and eye exams. Stay current with your vaccines. Tell your health care provider if: You often feel depressed. You have ever been abused or do not feel safe at home. Summary Adopting a healthy lifestyle and getting preventive care are important in promoting health and wellness. Follow your health care provider's instructions about healthy diet, exercising, and getting tested or screened for diseases. Follow your health care provider's instructions on monitoring your cholesterol and blood pressure. This information is not intended to replace advice given to you by your health care provider. Make sure you discuss any questions you have with your health care provider. Document Revised: 09/12/2020 Document Reviewed:  09/12/2020 Elsevier Patient Education  2024 [employer].

## 2023-12-11 ENCOUNTER — Ambulatory Visit (INDEPENDENT_AMBULATORY_CARE_PROVIDER_SITE_OTHER): Admitting: Internal Medicine

## 2023-12-11 VITALS — BP 122/78 | HR 72 | Temp 97.9°F | Ht 69.0 in | Wt 246.0 lb

## 2023-12-11 DIAGNOSIS — B356 Tinea cruris: Secondary | ICD-10-CM | POA: Diagnosis not present

## 2023-12-11 DIAGNOSIS — E1169 Type 2 diabetes mellitus with other specified complication: Secondary | ICD-10-CM | POA: Diagnosis not present

## 2023-12-11 DIAGNOSIS — Z0000 Encounter for general adult medical examination without abnormal findings: Secondary | ICD-10-CM | POA: Diagnosis not present

## 2023-12-11 DIAGNOSIS — I10 Essential (primary) hypertension: Secondary | ICD-10-CM

## 2023-12-11 DIAGNOSIS — E782 Mixed hyperlipidemia: Secondary | ICD-10-CM | POA: Diagnosis not present

## 2023-12-11 DIAGNOSIS — I2510 Atherosclerotic heart disease of native coronary artery without angina pectoris: Secondary | ICD-10-CM

## 2023-12-11 DIAGNOSIS — E038 Other specified hypothyroidism: Secondary | ICD-10-CM

## 2023-12-11 DIAGNOSIS — Z7984 Long term (current) use of oral hypoglycemic drugs: Secondary | ICD-10-CM

## 2023-12-11 LAB — COMPREHENSIVE METABOLIC PANEL WITH GFR
ALT: 13 U/L (ref 0–53)
AST: 13 U/L (ref 0–37)
Albumin: 4.4 g/dL (ref 3.5–5.2)
Alkaline Phosphatase: 32 U/L — ABNORMAL LOW (ref 39–117)
BUN: 25 mg/dL — ABNORMAL HIGH (ref 6–23)
CO2: 27 meq/L (ref 19–32)
Calcium: 9.5 mg/dL (ref 8.4–10.5)
Chloride: 106 meq/L (ref 96–112)
Creatinine, Ser: 0.92 mg/dL (ref 0.40–1.50)
GFR: 81.11 mL/min (ref >60.00)
Glucose, Bld: 139 mg/dL — ABNORMAL HIGH (ref 70–99)
Potassium: 4 meq/L (ref 3.5–5.1)
Sodium: 141 meq/L (ref 135–145)
Total Bilirubin: 0.7 mg/dL (ref 0.2–1.2)
Total Protein: 7.4 g/dL (ref 6.0–8.3)

## 2023-12-11 LAB — LIPID PANEL
Cholesterol: 158 mg/dL (ref 0–200)
HDL: 40.1 mg/dL (ref >39.00)
LDL Cholesterol: 69 mg/dL (ref 0–99)
NonHDL: 117.83
Total CHOL/HDL Ratio: 4
Triglycerides: 244 mg/dL — ABNORMAL HIGH (ref 0.0–149.0)
VLDL: 48.8 mg/dL — ABNORMAL HIGH (ref 0.0–40.0)

## 2023-12-11 LAB — TSH: TSH: 3.92 u[IU]/mL (ref 0.35–5.50)

## 2023-12-11 LAB — CBC WITH DIFFERENTIAL/PLATELET
Basophils Absolute: 0.1 K/uL (ref 0.0–0.1)
Basophils Relative: 1.3 % (ref 0.0–3.0)
Eosinophils Absolute: 0.2 K/uL (ref 0.0–0.7)
Eosinophils Relative: 2.7 % (ref 0.0–5.0)
HCT: 47.5 % (ref 39.0–52.0)
Hemoglobin: 16.2 g/dL (ref 13.0–17.0)
Lymphocytes Relative: 23.2 % (ref 12.0–46.0)
Lymphs Abs: 1.4 K/uL (ref 0.7–4.0)
MCHC: 34.2 g/dL (ref 30.0–36.0)
MCV: 95.7 fl (ref 78.0–100.0)
Monocytes Absolute: 0.6 K/uL (ref 0.1–1.0)
Monocytes Relative: 9 % (ref 3.0–12.0)
Neutro Abs: 3.9 K/uL (ref 1.4–7.7)
Neutrophils Relative %: 63.8 % (ref 43.0–77.0)
Platelets: 185 K/uL (ref 150.0–400.0)
RBC: 4.96 Mil/uL (ref 4.22–5.81)
RDW: 12.8 % (ref 11.5–15.5)
WBC: 6.1 K/uL (ref 4.0–10.5)

## 2023-12-11 LAB — HEMOGLOBIN A1C: Hgb A1c MFr Bld: 7.6 % — ABNORMAL HIGH (ref 4.6–6.5)

## 2023-12-11 MED ORDER — FLUCONAZOLE 150 MG PO TABS: 150.0000 mg | ORAL_TABLET | ORAL | 0 refills | Status: AC

## 2023-12-11 NOTE — Assessment & Plan Note (Signed)
 Chronic BP controlled Continue  Metoprolol xl 50 mg daily

## 2023-12-11 NOTE — Assessment & Plan Note (Signed)
 Chronic BMI > 35 with comorbidities of hyperlipidemia, Hypertension and CAD, diabetes  Encouraged weight loss-he does want to lose weight, but is having difficulty getting weight off He is exercising more regularly and watching his diet more Increase exercise Decrease portions/calorie intake

## 2023-12-11 NOTE — Assessment & Plan Note (Signed)
 Chronic-intermittent elevation of TSH  last TSH in normal range Clinically euthyroid Check tsh

## 2023-12-11 NOTE — Assessment & Plan Note (Signed)
 Chronic  Lab Results  Component Value Date   HGBA1C 7.3 (A) 07/30/2023   Sugars not ideally controlled Check A1c today is 7.3%-improved Continue Jardiance  25 mg He wondered about a GLP-1 inhibitor which we did discuss and he will think about-discussed possible side effects and pros and cons of taking medication Stressed regular exercise, diabetic diet Encouraged weight loss

## 2023-12-11 NOTE — Assessment & Plan Note (Signed)
 Acute ? From jardiance  Nystatin powder not approved by insurance Has tried otc powders and cream w/o relief - temporarily only Diflucan  150 mg Q 72 hr x 3 doses

## 2023-12-11 NOTE — Assessment & Plan Note (Signed)
 Chronic Following with cardiology No symptoms consistent with angina Continue aspirin 81 mg daily, atorvastatin 10 mg daily Blood pressure controlled Sugars not ideally controlled healthy diet , regular exercise encouraged

## 2023-12-11 NOTE — Assessment & Plan Note (Signed)
 Chronic Regular exercise and healthy diet encouraged Lab Results  Component Value Date   LDLCALC 65 06/14/2023   LDL at goal Check lipid panel, CMP Continue atorvastatin 10 mg daily

## 2023-12-12 ENCOUNTER — Ambulatory Visit: Payer: Self-pay | Admitting: Internal Medicine

## 2023-12-12 DIAGNOSIS — M25569 Pain in unspecified knee: Secondary | ICD-10-CM | POA: Diagnosis not present

## 2023-12-12 DIAGNOSIS — M25519 Pain in unspecified shoulder: Secondary | ICD-10-CM | POA: Diagnosis not present

## 2023-12-12 DIAGNOSIS — M546 Pain in thoracic spine: Secondary | ICD-10-CM | POA: Diagnosis not present

## 2023-12-12 DIAGNOSIS — R2681 Unsteadiness on feet: Secondary | ICD-10-CM | POA: Diagnosis not present

## 2023-12-17 ENCOUNTER — Encounter: Payer: Medicare HMO | Admitting: Internal Medicine

## 2023-12-17 ENCOUNTER — Other Ambulatory Visit (HOSPITAL_COMMUNITY): Payer: Self-pay

## 2023-12-17 MED ORDER — RYBELSUS 3 MG PO TABS
3.0000 mg | ORAL_TABLET | Freq: Every day | ORAL | 0 refills | Status: AC
Start: 2023-12-17 — End: ?

## 2023-12-17 MED ORDER — RYBELSUS 3 MG PO TABS
3.0000 mg | ORAL_TABLET | Freq: Every day | ORAL | 0 refills | Status: AC
  Filled 2023-12-17: qty 30, 30d supply, fill #0

## 2023-12-18 DIAGNOSIS — M25519 Pain in unspecified shoulder: Secondary | ICD-10-CM | POA: Diagnosis not present

## 2023-12-18 DIAGNOSIS — M546 Pain in thoracic spine: Secondary | ICD-10-CM | POA: Diagnosis not present

## 2023-12-18 DIAGNOSIS — M25569 Pain in unspecified knee: Secondary | ICD-10-CM | POA: Diagnosis not present

## 2023-12-18 DIAGNOSIS — R2681 Unsteadiness on feet: Secondary | ICD-10-CM | POA: Diagnosis not present

## 2023-12-19 DIAGNOSIS — M25569 Pain in unspecified knee: Secondary | ICD-10-CM | POA: Diagnosis not present

## 2023-12-19 DIAGNOSIS — M25519 Pain in unspecified shoulder: Secondary | ICD-10-CM | POA: Diagnosis not present

## 2023-12-19 DIAGNOSIS — M546 Pain in thoracic spine: Secondary | ICD-10-CM | POA: Diagnosis not present

## 2023-12-19 DIAGNOSIS — R2681 Unsteadiness on feet: Secondary | ICD-10-CM | POA: Diagnosis not present

## 2023-12-23 DIAGNOSIS — M25519 Pain in unspecified shoulder: Secondary | ICD-10-CM | POA: Diagnosis not present

## 2023-12-23 DIAGNOSIS — M25569 Pain in unspecified knee: Secondary | ICD-10-CM | POA: Diagnosis not present

## 2023-12-23 DIAGNOSIS — R2681 Unsteadiness on feet: Secondary | ICD-10-CM | POA: Diagnosis not present

## 2023-12-23 DIAGNOSIS — M546 Pain in thoracic spine: Secondary | ICD-10-CM | POA: Diagnosis not present

## 2023-12-25 ENCOUNTER — Other Ambulatory Visit: Payer: Self-pay | Admitting: Internal Medicine

## 2023-12-26 DIAGNOSIS — M25569 Pain in unspecified knee: Secondary | ICD-10-CM | POA: Diagnosis not present

## 2023-12-26 DIAGNOSIS — M546 Pain in thoracic spine: Secondary | ICD-10-CM | POA: Diagnosis not present

## 2023-12-26 DIAGNOSIS — M25519 Pain in unspecified shoulder: Secondary | ICD-10-CM | POA: Diagnosis not present

## 2023-12-26 DIAGNOSIS — R2681 Unsteadiness on feet: Secondary | ICD-10-CM | POA: Diagnosis not present

## 2023-12-31 DIAGNOSIS — R2681 Unsteadiness on feet: Secondary | ICD-10-CM | POA: Diagnosis not present

## 2023-12-31 DIAGNOSIS — M25519 Pain in unspecified shoulder: Secondary | ICD-10-CM | POA: Diagnosis not present

## 2023-12-31 DIAGNOSIS — M546 Pain in thoracic spine: Secondary | ICD-10-CM | POA: Diagnosis not present

## 2023-12-31 DIAGNOSIS — M25569 Pain in unspecified knee: Secondary | ICD-10-CM | POA: Diagnosis not present

## 2024-01-02 DIAGNOSIS — M25519 Pain in unspecified shoulder: Secondary | ICD-10-CM | POA: Diagnosis not present

## 2024-01-02 DIAGNOSIS — M25569 Pain in unspecified knee: Secondary | ICD-10-CM | POA: Diagnosis not present

## 2024-01-02 DIAGNOSIS — R2681 Unsteadiness on feet: Secondary | ICD-10-CM | POA: Diagnosis not present

## 2024-01-02 DIAGNOSIS — M546 Pain in thoracic spine: Secondary | ICD-10-CM | POA: Diagnosis not present

## 2024-01-07 DIAGNOSIS — R2681 Unsteadiness on feet: Secondary | ICD-10-CM | POA: Diagnosis not present

## 2024-01-07 DIAGNOSIS — M546 Pain in thoracic spine: Secondary | ICD-10-CM | POA: Diagnosis not present

## 2024-01-07 DIAGNOSIS — M25569 Pain in unspecified knee: Secondary | ICD-10-CM | POA: Diagnosis not present

## 2024-01-07 DIAGNOSIS — M25519 Pain in unspecified shoulder: Secondary | ICD-10-CM | POA: Diagnosis not present

## 2024-01-09 DIAGNOSIS — R2681 Unsteadiness on feet: Secondary | ICD-10-CM | POA: Diagnosis not present

## 2024-01-09 DIAGNOSIS — M25569 Pain in unspecified knee: Secondary | ICD-10-CM | POA: Diagnosis not present

## 2024-01-09 DIAGNOSIS — M25519 Pain in unspecified shoulder: Secondary | ICD-10-CM | POA: Diagnosis not present

## 2024-01-09 DIAGNOSIS — M546 Pain in thoracic spine: Secondary | ICD-10-CM | POA: Diagnosis not present

## 2024-01-13 ENCOUNTER — Encounter: Admitting: Internal Medicine

## 2024-01-16 ENCOUNTER — Ambulatory Visit: Payer: Self-pay

## 2024-01-16 NOTE — Telephone Encounter (Signed)
 Spoke with patient today and info given.

## 2024-01-16 NOTE — Telephone Encounter (Signed)
 FYI Only or Action Required?: Action required by provider: update on patient condition.  Patient was last seen in primary care on 12/11/2023 by Geofm Glade PARAS, MD.  Called Nurse Triage reporting Diarrhea.  Symptoms began several days ago.  Interventions attempted: Imodium .  Symptoms are: gradually improving.  Triage Disposition: Home Care  Patient/caregiver understands and will follow disposition?: YesCopied from CRM #[id number]. Topic: Clinical - Red Word Triage >> Jan 16, 2024  8:11 AM Macario HERO wrote: Red Word that prompted transfer to Nurse Triage: Patient thinks he is having a reaction to Rybelsus . Experiencing diarrhea since Monday, heavy burping, stomach ache and fever. Reason for Disposition  MILD-MODERATE diarrhea (e.g., 1-6 times / day more than normal)  Answer Assessment - Initial Assessment Questions Pt started Rybelsus  roughly a month ago.  Pt doesn't have much of an appetite. Pt not sure if medication or stomach bug. No problems until Monday night. Pt has taken imodium and has slowed diarrhea. RN gave home care. Please advise     1. DIARRHEA SEVERITY: How bad is the diarrhea? How many more stools have you had in the past 24 hours than normal?      Multiple 2. ONSET: When did the diarrhea begin?      Monday  3. STOOL DESCRIPTION:  How loose or watery is the diarrhea? What is the stool color? Is there any blood or mucous in the stool?     Watery  4. VOMITING: Are you also vomiting? If Yes, ask: How many times in the past 24 hours?      denies 5. ABDOMEN PAIN: Are you having any abdomen pain? If Yes, ask: What does it feel like? (e.g., crampy, dull, intermittent, constant)      rumbling 6. ABDOMEN PAIN SEVERITY: If present, ask: How bad is the pain?  (e.g., Scale 1-10; mild, moderate, or severe)     denies 7. ORAL INTAKE: If vomiting, Have you been able to drink liquids? How much liquids have you had in the past 24 hours?     Not eating much   8. HYDRATION: Any signs of dehydration? (e.g., dry mouth [not just dry lips], too weak to stand, dizziness, new weight loss) When did you last urinate?     Weak due to diet 9. EXPOSURE: Have you traveled to a foreign country recently? Have you been exposed to anyone with diarrhea? Could you have eaten any food that was spoiled?     na 10. ANTIBIOTIC USE: Are you taking antibiotics now or have you taken antibiotics in the past 2 months?       na 11. OTHER SYMPTOMS: Do you have any other symptoms? (e.g., fever, blood in stool)       Belching  Protocols used: Diarrhea-A-AH

## 2024-01-16 NOTE — Telephone Encounter (Signed)
 Hold Rybelsus  for now and see if symptoms resolve.

## 2024-01-20 ENCOUNTER — Telehealth: Payer: Self-pay | Admitting: Radiology

## 2024-01-20 ENCOUNTER — Ambulatory Visit: Payer: Self-pay

## 2024-01-20 NOTE — Telephone Encounter (Signed)
 FYI Only or Action Required?: Action required by provider: clinical question for provider and update on patient condition.  Patient was last seen in primary care on 12/11/2023 by Geofm Glade PARAS, MD.  Called Nurse Triage reporting Medication Problem.  Symptoms began several days ago.  Interventions attempted: Other: med changes.  Symptoms are: not resolved.  Triage Disposition: Call PCP When Office is Open  Patient/caregiver understands and will follow disposition?:     Reason for Disposition  MILD constipation  [1] Caller has NON-URGENT medicine question about med that PCP prescribed AND [2] triager unable to answer question  Answer Assessment - Initial Assessment Questions 1. STOOL PATTERN OR FREQUENCY: How often do you have a bowel movement (BM)?  (Normal range: 3 times a day to every 3 days)  When was your last BM?       Saturday  2. STRAINING: Do you have to strain to have a BM?      Denies  3. ONSET: When did the constipation begin?     Saturday  4. RECTAL PAIN: Does your rectum hurt when the stool comes out? If Yes, ask: Do you have hemorrhoids? How bad is the pain?  (Scale 1-10; or mild, moderate, severe)     Denies  5. BM COMPOSITION: Are the stools hard?       6. BLOOD ON STOOLS: Has there been any blood on the toilet tissue or on the surface of the BM? If Yes, ask: When was the last time?     denies 7. CHRONIC CONSTIPATION: Is this a new problem for you?  If No, ask: How long have you had this problem? (days, weeks, months)       8. CHANGES IN DIET OR HYDRATION: Have there been any recent changes in your diet? How much fluids are you drinking on a daily basis?  How much have you had to drink today?     Medication changes 9. MEDICINES: Have you been taking any new medicines? Are you taking any narcotic pain medicines? (e.g., Dilaudid, morphine, Percocet, Vicodin)      10. LAXATIVES: Have you been using any stool softeners, laxatives, or  enemas?  If Yes, ask What are you using, how often, and when was the last time?       Not at this time  54. ACTIVITY:  How much walking do you do every day?  Has your activity level decreased in the past week?         12. CAUSE: What do you think is causing the constipation?        Medication changes 13. MEDICAL HISTORY: Do you have a history of hemorrhoids, rectal fissures, rectal surgery, or rectal abscess?          14. OTHER SYMPTOMS: Do you have any other symptoms? (e.g., abdomen pain, bloating, fever, vomiting)       denies  Answer Assessment - Initial Assessment Questions Additional info: Patient called in very upset and frustrated he has not received a call back from clinical staff today, states he called at 0730 and was told he would receive a call back. Upon chart review noted telephone encounter and Mychart message from clinic staff. This Clinical research associate read message and patient verbalized understanding stated he will restart Jardiance , requesting refill, he would like to know if he should restart Rebelsis as well. Informed patient that Rebelsis was not mentioned in today's message and will need to reach back out to pcp. Patient is requesting call back from office,  aware he will not receive return call until 01/21/24.   Patient shares he was struggling with diarrhea, advised to stop medications Rebelsis and Jardiance  which resolved diarrhea. Now feeling constipated. See constipation protocol.     1. NAME of MEDICINE: What medicine(s) are you calling about? Jardiance , Rebelsis  Protocols used: Constipation-A-AH, Medication Question Call-A-AH

## 2024-01-20 NOTE — Telephone Encounter (Signed)
 Copied from CRM [id number]. Topic: Clinical - Medical Advice >> Jan 20, 2024  7:36 AM Berneda FALCON wrote: Reason for CRM: Patient stated he stopped taking the rebalsis and the jadiance and symptoms have improved, but he would like to know what the next step is.  Patient callback is [phone]

## 2024-01-20 NOTE — Telephone Encounter (Signed)
 If he was doing okay with the Jardiance  I would recommend restarting that.  If you think that was contributing to his symptoms we can start metformin.

## 2024-01-21 ENCOUNTER — Other Ambulatory Visit: Payer: Self-pay

## 2024-01-21 MED ORDER — EMPAGLIFLOZIN 25 MG PO TABS: 25.0000 mg | ORAL_TABLET | Freq: Every day | ORAL | 1 refills | Status: AC

## 2024-01-21 NOTE — Telephone Encounter (Signed)
 Patient called yesterday.  My-chart message was sent to him regarding options.  Spoke with patient this morning.

## 2024-01-21 NOTE — Telephone Encounter (Signed)
 Yes - stay on 25 mg

## 2024-01-28 DIAGNOSIS — M546 Pain in thoracic spine: Secondary | ICD-10-CM | POA: Diagnosis not present

## 2024-01-28 DIAGNOSIS — R2681 Unsteadiness on feet: Secondary | ICD-10-CM | POA: Diagnosis not present

## 2024-01-28 DIAGNOSIS — M25569 Pain in unspecified knee: Secondary | ICD-10-CM | POA: Diagnosis not present

## 2024-01-28 DIAGNOSIS — M25519 Pain in unspecified shoulder: Secondary | ICD-10-CM | POA: Diagnosis not present

## 2024-01-30 DIAGNOSIS — H35373 Puckering of macula, bilateral: Secondary | ICD-10-CM | POA: Diagnosis not present

## 2024-01-30 DIAGNOSIS — H26493 Other secondary cataract, bilateral: Secondary | ICD-10-CM | POA: Diagnosis not present

## 2024-01-30 DIAGNOSIS — Z961 Presence of intraocular lens: Secondary | ICD-10-CM | POA: Diagnosis not present

## 2024-01-30 DIAGNOSIS — H26492 Other secondary cataract, left eye: Secondary | ICD-10-CM | POA: Diagnosis not present

## 2024-01-30 DIAGNOSIS — H18413 Arcus senilis, bilateral: Secondary | ICD-10-CM | POA: Diagnosis not present

## 2024-02-10 DIAGNOSIS — Z961 Presence of intraocular lens: Secondary | ICD-10-CM | POA: Diagnosis not present

## 2024-02-11 DIAGNOSIS — M25519 Pain in unspecified shoulder: Secondary | ICD-10-CM | POA: Diagnosis not present

## 2024-02-11 DIAGNOSIS — R2681 Unsteadiness on feet: Secondary | ICD-10-CM | POA: Diagnosis not present

## 2024-02-11 DIAGNOSIS — M25569 Pain in unspecified knee: Secondary | ICD-10-CM | POA: Diagnosis not present

## 2024-02-11 DIAGNOSIS — M546 Pain in thoracic spine: Secondary | ICD-10-CM | POA: Diagnosis not present

## 2024-02-11 NOTE — Progress Notes (Unsigned)
 [name] [address] [place] [address] Phone: [phone] Subjective:   Edwin [name], am serving as a scribe for Dr. Arthea Claudene.  I'm seeing this patient by the request  of:  Geofm Glade PARAS, MD  CC: Left knee pain  YEP:Dlagzrupcz  11/13/2023 Patient did remarkable with the last viscosupplementation and hopeful that we will get a long duration of improvement again.  Would like to start patient also in formal physical therapy for this as well as balance and coordination.  Patient is trying to stay more active.  Discussed icing regimen of home exercises, increase activity slowly.  Follow-up with me again in 3 to 4 months      Update 02/12/2024 Edwin [name] is a 78 y.o. male coming in with complaint of L knee pain.  Given viscosupplementation at last exam in July.  Patient states doing alright today. Previous injections had worked better. Started having vertigo about a week ago.       Past Medical History:  Diagnosis Date   BPH (benign prostatic hypertrophy)    Cancer (HCC)    Basal cell of face, Dr. Rolan Molt   Cataract    Diverticulosis    Bertrum syndrome    Hyperglycemia    Hyperlipidemia    NMR Lipoprofile 2005; LDL 146 (2007/1330), HDL 38, TG 169.  LDL goal = <100; Framingham Study LDL goal =<130   Hypertension    OSA on CPAP    Preproliferative diabetic retinopathy (HCC)    Sleep apnea    Tubular adenoma of colon 01/19/2008   Past Surgical History:  Procedure Laterality Date   catract removal Bilateral    COLONOSCOPY  05/07/2012   neg; [facility]   COLONOSCOPY W/ POLYPECTOMY  2004 & 2009   Tics; [facility]   KNEE ARTHROSCOPY     [name], left knee   TONSILLECTOMY     Torn meniscus  Right    WISDOM TOOTH EXTRACTION     Social History   Socioeconomic History   Marital status: Married    Spouse name: Educational psychologist   Number of children: 2   Years of education: 18   Highest education level: Not on file   Occupational History   Occupation: Gaffer // Retired    Associate Professor: [employer]   Tobacco Use   Smoking status: Never   Smokeless tobacco: Never  Vaping Use   Vaping status: Never Used  Substance and Sexual Activity   Alcohol use: Yes    Alcohol/week: 0.0 standard drinks of alcohol    Comment: socially, 3-5 beers/week   Drug use: No   Sexual activity: Yes  Other Topics Concern   Not on file  Social History Narrative   Fun: Travel, read, movies, concerts    Denies abuse and feels safe at home.    Social Drivers of Corporate investment banker Strain: Low Risk  (11/04/2023)   Overall Financial Resource Strain (CARDIA)    Difficulty of Paying Living Expenses: Not hard at all  Food Insecurity: No Food Insecurity (11/04/2023)   Hunger Vital Sign    Worried About Running Out of Food in the Last Year: Never true    Ran Out of Food in the Last Year: Never true  Transportation Needs: No Transportation Needs (11/04/2023)   PRAPARE - Administrator, Civil Service (Medical): No    Lack of Transportation (Non-Medical): No  Physical Activity: Unknown (11/04/2023)  Exercise Vital Sign    Days of Exercise per Week: Not on file    Minutes of Exercise per Session: 40 min  Stress: No Stress Concern Present (11/04/2023)   [employer] of Occupational Health - Occupational Stress Questionnaire    Feeling of Stress: Not at all  Social Connections: Socially Integrated (11/04/2023)   Social Connection and Isolation Panel    Frequency of Communication with Friends and Family: More than three times a week    Frequency of Social Gatherings with Friends and Family: More than three times a week    Attends Religious Services: More than 4 times per year    Active Member of [employer] or Organizations: Yes    Attends Banker Meetings: Never    Marital Status: Married   No Known Allergies Family History  Problem Relation Age of Onset   Diverticulosis Mother     Stroke Mother        late 18s   Parkinsonism Mother    Diabetes Father    Hypertension Father    Heart disease Father 46       CABG   Hypertension Sister    Appendicitis Sister        ruptured   Cancer Brother        lymphoma   Appendicitis Brother        ruptured   Asthma Brother        childhood   Heart attack Maternal Grandmother 80       questionable   Heart attack Maternal Grandfather 57   Parkinsonism Maternal Grandfather    Stroke Paternal Grandmother 39   Colon cancer Neg Hx    Esophageal cancer Neg Hx    Rectal cancer Neg Hx    Stomach cancer Neg Hx    Colon polyps Neg Hx     Current Outpatient Medications (Endocrine & Metabolic):    empagliflozin  (JARDIANCE ) 25 MG TABS tablet, Take 1 tablet (25 mg total) by mouth daily.   Semaglutide  (RYBELSUS ) 3 MG TABS, Take 1 tablet (3 mg total) by mouth daily.   Semaglutide  (RYBELSUS ) 3 MG TABS, Take 1 tablet (3 mg total) by mouth daily.  Current Outpatient Medications (Cardiovascular):    atorvastatin  (LIPITOR) 10 MG tablet, TAKE 1 TABLET EVERY OTHER DAY   metoprolol  succinate (TOPROL -XL) 50 MG 24 hr tablet, TAKE 1 TABLET ONCE DAILY. TAKE WITH OR IMMEDIATELY FOLLOWING A MEAL.   nitroGLYCERIN  (NITROSTAT ) 0.4 MG SL tablet, Place 1 tablet (0.4 mg total) under the tongue every 5 (five) minutes as needed for chest pain.  Current Outpatient Medications (Respiratory):    albuterol  (VENTOLIN  HFA) 108 (90 Base) MCG/ACT inhaler, Inhale 2 puffs into the lungs every 6 (six) hours as needed (wheezing or cough).   azelastine (ASTELIN) 0.1 % nasal spray, Place into the nose.  Current Outpatient Medications (Analgesics):    aspirin 81 MG tablet, Take 81 mg by mouth daily.   naproxen sodium (ALEVE) 220 MG tablet, Take 220 mg by mouth daily as needed.  Current Outpatient Medications (Hematological):    Cyanocobalamin (VITAMIN B-12 PO), Take by mouth as directed.  Current Outpatient Medications (Other):    Cholecalciferol (VITAMIN D3)  1000 UNITS CAPS, Take 1 capsule by mouth daily.   fluconazole  (DIFLUCAN ) 150 MG tablet, Take 1 tablet (150 mg total) by mouth every 3 (three) days.   Multiple Vitamin (MULTIVITAMIN) tablet, Take 1 tablet by mouth daily.   Omega-3 Fatty Acids (FISH OIL) 300 MG CAPS, Take by mouth.  trimethoprim -polymyxin b  (POLYTRIM ) ophthalmic solution,    Turmeric (QC TUMERIC COMPLEX PO), Take by mouth daily.   Reviewed prior external information including notes and imaging from  primary care provider As well as notes that were available from care everywhere and other healthcare systems.  Past medical history, social, surgical and family history all reviewed in electronic medical record.  No pertanent information unless stated regarding to the chief complaint.   Review of Systems:  No headache, visual changes, nausea, vomiting, diarrhea, constipation, dizziness, abdominal pain, skin rash, fevers, chills, night sweats, weight loss, swollen lymph nodes, body aches, joint swelling, chest pain, shortness of breath, mood changes. POSITIVE muscle aches  Objective  Blood pressure 128/84, pulse 75, height 5' 9 (1.753 m), weight 242 lb (109.8 kg), SpO2 96%.   General: No apparent distress alert and oriented x3 mood and affect normal, dressed appropriately.  HEENT: Pupils equal, extraocular movements intact  Respiratory: Patient's speak in full sentences and does not appear short of breath  Cardiovascular: No lower extremity edema, non tender, no erythema  Left knee exam shows some arthritic changes noted.  No significant swelling no noted at the moment.    Impression and Recommendations:    The above documentation has been reviewed and is accurate and complete Malinda Mayden M Devarius Nelles, DO

## 2024-02-12 ENCOUNTER — Ambulatory Visit: Admitting: Family Medicine

## 2024-02-12 VITALS — BP 128/84 | HR 75 | Ht 69.0 in | Wt 242.0 lb

## 2024-02-12 DIAGNOSIS — G629 Polyneuropathy, unspecified: Secondary | ICD-10-CM | POA: Diagnosis not present

## 2024-02-12 DIAGNOSIS — M1712 Unilateral primary osteoarthritis, left knee: Secondary | ICD-10-CM | POA: Diagnosis not present

## 2024-02-12 DIAGNOSIS — R252 Cramp and spasm: Secondary | ICD-10-CM | POA: Diagnosis not present

## 2024-02-12 MED ORDER — CYANOCOBALAMIN 1000 MCG/ML IJ SOLN
1000.0000 ug | Freq: Once | INTRAMUSCULAR | Status: AC
  Administered 2024-02-12: 1000 ug via INTRAMUSCULAR

## 2024-02-12 NOTE — Assessment & Plan Note (Signed)
 History of muscle cramping and B12 deficiency, discussed potential treatment, given injection we will see how patient responds.

## 2024-02-12 NOTE — Patient Instructions (Signed)
 Great to see you  B12 injection today  The knee some exercises for popliteal tendon New shoes New balance 990 or 1080 Flonase  daily in each nostril for 2 weeks  Dix Hilpike maneuver  See me againin 8-12 weeks

## 2024-02-12 NOTE — Assessment & Plan Note (Signed)
 Discussed with patient at this time about the viscosupplementation.  Has not responded extremely well to steroid injections but would consider it again.  Held on any injection today though with no significant swelling.  Will have patient continue to work on [employer].  Follow-up with me again in 2 months

## 2024-02-13 DIAGNOSIS — M25519 Pain in unspecified shoulder: Secondary | ICD-10-CM | POA: Diagnosis not present

## 2024-02-13 DIAGNOSIS — R2681 Unsteadiness on feet: Secondary | ICD-10-CM | POA: Diagnosis not present

## 2024-02-13 DIAGNOSIS — M25569 Pain in unspecified knee: Secondary | ICD-10-CM | POA: Diagnosis not present

## 2024-02-13 DIAGNOSIS — M546 Pain in thoracic spine: Secondary | ICD-10-CM | POA: Diagnosis not present

## 2024-02-18 DIAGNOSIS — M25569 Pain in unspecified knee: Secondary | ICD-10-CM | POA: Diagnosis not present

## 2024-02-18 DIAGNOSIS — R2681 Unsteadiness on feet: Secondary | ICD-10-CM | POA: Diagnosis not present

## 2024-02-18 DIAGNOSIS — M25519 Pain in unspecified shoulder: Secondary | ICD-10-CM | POA: Diagnosis not present

## 2024-02-18 DIAGNOSIS — M546 Pain in thoracic spine: Secondary | ICD-10-CM | POA: Diagnosis not present

## 2024-02-20 DIAGNOSIS — M546 Pain in thoracic spine: Secondary | ICD-10-CM | POA: Diagnosis not present

## 2024-02-20 DIAGNOSIS — M25519 Pain in unspecified shoulder: Secondary | ICD-10-CM | POA: Diagnosis not present

## 2024-02-20 DIAGNOSIS — R2681 Unsteadiness on feet: Secondary | ICD-10-CM | POA: Diagnosis not present

## 2024-02-20 DIAGNOSIS — M25569 Pain in unspecified knee: Secondary | ICD-10-CM | POA: Diagnosis not present

## 2024-02-25 DIAGNOSIS — M25569 Pain in unspecified knee: Secondary | ICD-10-CM | POA: Diagnosis not present

## 2024-02-25 DIAGNOSIS — M546 Pain in thoracic spine: Secondary | ICD-10-CM | POA: Diagnosis not present

## 2024-02-25 DIAGNOSIS — R2681 Unsteadiness on feet: Secondary | ICD-10-CM | POA: Diagnosis not present

## 2024-02-25 DIAGNOSIS — M25519 Pain in unspecified shoulder: Secondary | ICD-10-CM | POA: Diagnosis not present

## 2024-02-27 DIAGNOSIS — M546 Pain in thoracic spine: Secondary | ICD-10-CM | POA: Diagnosis not present

## 2024-02-27 DIAGNOSIS — M25519 Pain in unspecified shoulder: Secondary | ICD-10-CM | POA: Diagnosis not present

## 2024-02-27 DIAGNOSIS — M25569 Pain in unspecified knee: Secondary | ICD-10-CM | POA: Diagnosis not present

## 2024-02-27 DIAGNOSIS — R2681 Unsteadiness on feet: Secondary | ICD-10-CM | POA: Diagnosis not present

## 2024-02-28 DIAGNOSIS — H26491 Other secondary cataract, right eye: Secondary | ICD-10-CM | POA: Diagnosis not present

## 2024-03-03 DIAGNOSIS — M25569 Pain in unspecified knee: Secondary | ICD-10-CM | POA: Diagnosis not present

## 2024-03-03 DIAGNOSIS — M546 Pain in thoracic spine: Secondary | ICD-10-CM | POA: Diagnosis not present

## 2024-03-03 DIAGNOSIS — M25519 Pain in unspecified shoulder: Secondary | ICD-10-CM | POA: Diagnosis not present

## 2024-03-03 DIAGNOSIS — R2681 Unsteadiness on feet: Secondary | ICD-10-CM | POA: Diagnosis not present

## 2024-03-05 DIAGNOSIS — M546 Pain in thoracic spine: Secondary | ICD-10-CM | POA: Diagnosis not present

## 2024-03-05 DIAGNOSIS — M25569 Pain in unspecified knee: Secondary | ICD-10-CM | POA: Diagnosis not present

## 2024-03-05 DIAGNOSIS — R2681 Unsteadiness on feet: Secondary | ICD-10-CM | POA: Diagnosis not present

## 2024-03-05 DIAGNOSIS — M25519 Pain in unspecified shoulder: Secondary | ICD-10-CM | POA: Diagnosis not present

## 2024-03-06 DIAGNOSIS — Z961 Presence of intraocular lens: Secondary | ICD-10-CM | POA: Diagnosis not present

## 2024-03-10 DIAGNOSIS — M25569 Pain in unspecified knee: Secondary | ICD-10-CM | POA: Diagnosis not present

## 2024-03-10 DIAGNOSIS — M25519 Pain in unspecified shoulder: Secondary | ICD-10-CM | POA: Diagnosis not present

## 2024-03-10 DIAGNOSIS — M546 Pain in thoracic spine: Secondary | ICD-10-CM | POA: Diagnosis not present

## 2024-03-10 DIAGNOSIS — R2681 Unsteadiness on feet: Secondary | ICD-10-CM | POA: Diagnosis not present

## 2024-03-12 DIAGNOSIS — M25569 Pain in unspecified knee: Secondary | ICD-10-CM | POA: Diagnosis not present

## 2024-03-12 DIAGNOSIS — R2681 Unsteadiness on feet: Secondary | ICD-10-CM | POA: Diagnosis not present

## 2024-03-12 DIAGNOSIS — M546 Pain in thoracic spine: Secondary | ICD-10-CM | POA: Diagnosis not present

## 2024-03-12 DIAGNOSIS — M25519 Pain in unspecified shoulder: Secondary | ICD-10-CM | POA: Diagnosis not present

## 2024-03-17 DIAGNOSIS — M25519 Pain in unspecified shoulder: Secondary | ICD-10-CM | POA: Diagnosis not present

## 2024-03-17 DIAGNOSIS — M546 Pain in thoracic spine: Secondary | ICD-10-CM | POA: Diagnosis not present

## 2024-03-17 DIAGNOSIS — R2681 Unsteadiness on feet: Secondary | ICD-10-CM | POA: Diagnosis not present

## 2024-03-17 DIAGNOSIS — M25569 Pain in unspecified knee: Secondary | ICD-10-CM | POA: Diagnosis not present

## 2024-03-19 DIAGNOSIS — M546 Pain in thoracic spine: Secondary | ICD-10-CM | POA: Diagnosis not present

## 2024-03-19 DIAGNOSIS — R2681 Unsteadiness on feet: Secondary | ICD-10-CM | POA: Diagnosis not present

## 2024-03-19 DIAGNOSIS — M25519 Pain in unspecified shoulder: Secondary | ICD-10-CM | POA: Diagnosis not present

## 2024-03-19 DIAGNOSIS — M25569 Pain in unspecified knee: Secondary | ICD-10-CM | POA: Diagnosis not present

## 2024-03-24 DIAGNOSIS — E785 Hyperlipidemia, unspecified: Secondary | ICD-10-CM | POA: Diagnosis not present

## 2024-03-24 DIAGNOSIS — R32 Unspecified urinary incontinence: Secondary | ICD-10-CM | POA: Diagnosis not present

## 2024-03-24 DIAGNOSIS — Z6837 Body mass index (BMI) 37.0-37.9, adult: Secondary | ICD-10-CM | POA: Diagnosis not present

## 2024-03-24 DIAGNOSIS — I10 Essential (primary) hypertension: Secondary | ICD-10-CM | POA: Diagnosis not present

## 2024-03-24 DIAGNOSIS — Z85828 Personal history of other malignant neoplasm of skin: Secondary | ICD-10-CM | POA: Diagnosis not present

## 2024-03-24 DIAGNOSIS — Z7982 Long term (current) use of aspirin: Secondary | ICD-10-CM | POA: Diagnosis not present

## 2024-03-24 DIAGNOSIS — E11620 Type 2 diabetes mellitus with diabetic dermatitis: Secondary | ICD-10-CM | POA: Diagnosis not present

## 2024-03-24 DIAGNOSIS — Z9989 Dependence on other enabling machines and devices: Secondary | ICD-10-CM | POA: Diagnosis not present

## 2024-03-24 DIAGNOSIS — Z8249 Family history of ischemic heart disease and other diseases of the circulatory system: Secondary | ICD-10-CM | POA: Diagnosis not present

## 2024-03-24 DIAGNOSIS — Z9849 Cataract extraction status, unspecified eye: Secondary | ICD-10-CM | POA: Diagnosis not present

## 2024-03-24 DIAGNOSIS — Z791 Long term (current) use of non-steroidal anti-inflammatories (NSAID): Secondary | ICD-10-CM | POA: Diagnosis not present

## 2024-03-24 DIAGNOSIS — I2510 Atherosclerotic heart disease of native coronary artery without angina pectoris: Secondary | ICD-10-CM | POA: Diagnosis not present

## 2024-03-24 DIAGNOSIS — G4733 Obstructive sleep apnea (adult) (pediatric): Secondary | ICD-10-CM | POA: Diagnosis not present

## 2024-03-24 DIAGNOSIS — Z823 Family history of stroke: Secondary | ICD-10-CM | POA: Diagnosis not present

## 2024-03-24 DIAGNOSIS — R600 Localized edema: Secondary | ICD-10-CM | POA: Diagnosis not present

## 2024-03-24 DIAGNOSIS — J301 Allergic rhinitis due to pollen: Secondary | ICD-10-CM | POA: Diagnosis not present

## 2024-03-24 DIAGNOSIS — M1990 Unspecified osteoarthritis, unspecified site: Secondary | ICD-10-CM | POA: Diagnosis not present

## 2024-03-26 DIAGNOSIS — R2681 Unsteadiness on feet: Secondary | ICD-10-CM | POA: Diagnosis not present

## 2024-03-26 DIAGNOSIS — M25569 Pain in unspecified knee: Secondary | ICD-10-CM | POA: Diagnosis not present

## 2024-03-26 DIAGNOSIS — M546 Pain in thoracic spine: Secondary | ICD-10-CM | POA: Diagnosis not present

## 2024-03-26 DIAGNOSIS — M25519 Pain in unspecified shoulder: Secondary | ICD-10-CM | POA: Diagnosis not present

## 2024-04-07 DIAGNOSIS — R2681 Unsteadiness on feet: Secondary | ICD-10-CM | POA: Diagnosis not present

## 2024-04-07 DIAGNOSIS — M25569 Pain in unspecified knee: Secondary | ICD-10-CM | POA: Diagnosis not present

## 2024-04-07 DIAGNOSIS — M25519 Pain in unspecified shoulder: Secondary | ICD-10-CM | POA: Diagnosis not present

## 2024-04-07 DIAGNOSIS — M546 Pain in thoracic spine: Secondary | ICD-10-CM | POA: Diagnosis not present

## 2024-04-07 NOTE — Progress Notes (Unsigned)
 [name] [address] [place] [address] Phone: [phone] Subjective:   Edwin [name] am a scribe for Dr. Claudene.   I'm seeing this patient by the request  of:  Geofm Glade PARAS, MD  CC: left  knee pain  YEP:Dlagzrupcz  02/12/2024 History of muscle cramping and B12 deficiency, discussed potential treatment, given injection we will see how patient responds.     Discussed with patient at this time about the viscosupplementation. Has not responded extremely well to steroid injections but would consider it again. Held on any injection today though with no significant swelling. Will have patient continue to work on [employer]. Follow-up with me again in 2 months   Update 04/08/2024 Edwin [name] is a 78 y.o. male coming in with complaint of neuropathy and L knee pain.  Patient at last visit was given a B12 injection.  Patient states most of the time it is fine. If he twist it a certain way or walks up the steps leading with the left leg it feels like it is going to give way.     MRI of the lumbar spine did show severe spinal stenosis at L3-L4 and moderate at L4-L5.  Past Medical History:  Diagnosis Date   BPH (benign prostatic hypertrophy)    Cancer (HCC)    Basal cell of face, Dr. Rolan Molt   Cataract    Diverticulosis    Bertrum syndrome    Hyperglycemia    Hyperlipidemia    NMR Lipoprofile 2005; LDL 146 (2007/1330), HDL 38, TG 169.  LDL goal = <100; Framingham Study LDL goal =<130   Hypertension    OSA on CPAP    Preproliferative diabetic retinopathy (HCC)    Sleep apnea    Tubular adenoma of colon 01/19/2008   Past Surgical History:  Procedure Laterality Date   catract removal Bilateral    COLONOSCOPY  05/07/2012   neg; [facility]   COLONOSCOPY W/ POLYPECTOMY  2004 & 2009   Tics; [facility]   KNEE ARTHROSCOPY     [name], left knee   TONSILLECTOMY     Torn meniscus  Right    WISDOM TOOTH EXTRACTION      Social History   Socioeconomic History   Marital status: Married    Spouse name: Educational Psychologist   Number of children: 2   Years of education: 18   Highest education level: Not on file  Occupational History   Occupation: Gaffer // Retired    Associate Professor: [employer]   Tobacco Use   Smoking status: Never   Smokeless tobacco: Never  Vaping Use   Vaping status: Never Used  Substance and Sexual Activity   Alcohol use: Yes    Alcohol/week: 0.0 standard drinks of alcohol    Comment: socially, 3-5 beers/week   Drug use: No   Sexual activity: Yes  Other Topics Concern   Not on file  Social History Narrative   Fun: Travel, read, movies, concerts    Denies abuse and feels safe at home.    Social Drivers of Corporate Investment Banker Strain: Low Risk  (11/04/2023)   Overall Financial Resource Strain (CARDIA)    Difficulty of Paying Living Expenses: Not hard at all  Food Insecurity: No Food Insecurity (11/04/2023)   Hunger Vital Sign    Worried About Running Out of Food in the Last Year: Never true    Ran Out of Food in the Last Year:  Never true  Transportation Needs: No Transportation Needs (11/04/2023)   PRAPARE - Administrator, Civil Service (Medical): No    Lack of Transportation (Non-Medical): No  Physical Activity: Unknown (11/04/2023)   Exercise Vital Sign    Days of Exercise per Week: Not on file    Minutes of Exercise per Session: 40 min  Stress: No Stress Concern Present (11/04/2023)   [employer] of Occupational Health - Occupational Stress Questionnaire    Feeling of Stress: Not at all  Social Connections: Socially Integrated (11/04/2023)   Social Connection and Isolation Panel    Frequency of Communication with Friends and Family: More than three times a week    Frequency of Social Gatherings with Friends and Family: More than three times a week    Attends Religious Services: More than 4 times per year    Active Member of [employer] or  Organizations: Yes    Attends Banker Meetings: Never    Marital Status: Married   No Known Allergies Family History  Problem Relation Age of Onset   Diverticulosis Mother    Stroke Mother        late 56s   Parkinsonism Mother    Diabetes Father    Hypertension Father    Heart disease Father 55       CABG   Hypertension Sister    Appendicitis Sister        ruptured   Cancer Brother        lymphoma   Appendicitis Brother        ruptured   Asthma Brother        childhood   Heart attack Maternal Grandmother 80       questionable   Heart attack Maternal Grandfather 40   Parkinsonism Maternal Grandfather    Stroke Paternal Grandmother 44   Colon cancer Neg Hx    Esophageal cancer Neg Hx    Rectal cancer Neg Hx    Stomach cancer Neg Hx    Colon polyps Neg Hx     Current Outpatient Medications (Endocrine & Metabolic):    empagliflozin  (JARDIANCE ) 25 MG TABS tablet, Take 1 tablet (25 mg total) by mouth daily.   Semaglutide  (RYBELSUS ) 3 MG TABS, Take 1 tablet (3 mg total) by mouth daily.   Semaglutide  (RYBELSUS ) 3 MG TABS, Take 1 tablet (3 mg total) by mouth daily.  Current Outpatient Medications (Cardiovascular):    atorvastatin  (LIPITOR) 10 MG tablet, TAKE 1 TABLET EVERY OTHER DAY   metoprolol  succinate (TOPROL -XL) 50 MG 24 hr tablet, TAKE 1 TABLET ONCE DAILY. TAKE WITH OR IMMEDIATELY FOLLOWING A MEAL.   nitroGLYCERIN  (NITROSTAT ) 0.4 MG SL tablet, Place 1 tablet (0.4 mg total) under the tongue every 5 (five) minutes as needed for chest pain.  Current Outpatient Medications (Respiratory):    albuterol  (VENTOLIN  HFA) 108 (90 Base) MCG/ACT inhaler, Inhale 2 puffs into the lungs every 6 (six) hours as needed (wheezing or cough).   azelastine (ASTELIN) 0.1 % nasal spray, Place into the nose.  Current Outpatient Medications (Analgesics):    aspirin 81 MG tablet, Take 81 mg by mouth daily.   naproxen sodium (ALEVE) 220 MG tablet, Take 220 mg by mouth daily as  needed.  Current Outpatient Medications (Hematological):    Cyanocobalamin  (VITAMIN B-12 PO), Take by mouth as directed.  Current Outpatient Medications (Other):    Cholecalciferol (VITAMIN D3) 1000 UNITS CAPS, Take 1 capsule by mouth daily.   fluconazole  (DIFLUCAN ) 150 MG tablet,  Take 1 tablet (150 mg total) by mouth every 3 (three) days.   Multiple Vitamin (MULTIVITAMIN) tablet, Take 1 tablet by mouth daily.   Omega-3 Fatty Acids (FISH OIL) 300 MG CAPS, Take by mouth.   trimethoprim -polymyxin b  (POLYTRIM ) ophthalmic solution,    Turmeric (QC TUMERIC COMPLEX PO), Take by mouth daily.   Reviewed prior external information including notes and imaging from  primary care provider As well as notes that were available from care everywhere and other healthcare systems.  Past medical history, social, surgical and family history all reviewed in electronic medical record.  No pertanent information unless stated regarding to the chief complaint.   Review of Systems:  No headache, visual changes, nausea, vomiting, diarrhea, constipation, dizziness, abdominal pain, skin rash, fevers, chills, night sweats, weight loss, swollen lymph nodes, body aches, joint swelling, chest pain, shortness of breath, mood changes. POSITIVE muscle aches  Objective  Blood pressure (!) 140/70, pulse 80, height 5' 9 (1.753 m), weight 242 lb (109.8 kg), SpO2 97%.   General: No apparent distress alert and oriented x3 mood and affect normal, dressed appropriately.  HEENT: Pupils equal, extraocular movements intact  Respiratory: Patient's speak in full sentences and does not appear short of breath  Cardiovascular: No lower extremity edema, non tender, no erythema  Knee exam shows arthritic changes noted.  Instability with valgus and varus force.  Patient has trace effusion noted.    Impression and Recommendations:    The above documentation has been reviewed and is accurate and complete Ariely Riddell M Wheeler [employer], DO

## 2024-04-08 ENCOUNTER — Ambulatory Visit: Admitting: Family Medicine

## 2024-04-08 VITALS — BP 140/70 | HR 80 | Ht 69.0 in | Wt 242.0 lb

## 2024-04-08 DIAGNOSIS — M1712 Unilateral primary osteoarthritis, left knee: Secondary | ICD-10-CM

## 2024-04-08 NOTE — Patient Instructions (Addendum)
 Good to see you. PRP handout. Gel approval after the new year. Heels butt head touching wall 3 times a day.  See me again in mid January.

## 2024-04-08 NOTE — Assessment & Plan Note (Signed)
 Discussed with patient most of it seems to be the medial compartment.  Encouraged him to wear the medial unloader brace on a more regular basis.  Could be a candidate for viscosupplementation again which patient has responded to in the past.  If it does not make as much improvement we did discuss PRP and given a handout.  Patient will follow-up with me again in 3 months otherwise.  I personally spent a total of 31 minutes in the care of the patient today including preparing to see the patient, performing a medically appropriate exam/evaluation, counseling and educating, documenting clinical information in the EHR, and communicating results.

## 2024-04-09 DIAGNOSIS — R2681 Unsteadiness on feet: Secondary | ICD-10-CM | POA: Diagnosis not present

## 2024-04-09 DIAGNOSIS — M546 Pain in thoracic spine: Secondary | ICD-10-CM | POA: Diagnosis not present

## 2024-04-09 DIAGNOSIS — M25519 Pain in unspecified shoulder: Secondary | ICD-10-CM | POA: Diagnosis not present

## 2024-04-09 DIAGNOSIS — M25569 Pain in unspecified knee: Secondary | ICD-10-CM | POA: Diagnosis not present

## 2024-05-27 ENCOUNTER — Telehealth: Payer: Self-pay

## 2024-05-27 NOTE — Progress Notes (Unsigned)
 " [name] [address] [place] [address] Phone: [phone] Subjective:   Edwin [name], am serving as a scribe for Dr. Arthea Claudene.  I'm seeing this patient by the request  of:  Geofm Glade PARAS, MD  CC: Left knee pain  YEP:Dlagzrupcz  04/08/2024 Discussed with patient most of it seems to be the medial compartment.  Encouraged him to wear the medial unloader brace on a more regular basis.  Could be a candidate for viscosupplementation again which patient has responded to in the past.  If it does not make as much improvement we did discuss PRP and given a handout.  Patient will follow-up with me again in 3 months otherwise.   I personally spent a total of 31 minutes in the care of the patient today including preparing to see the patient, performing a medically appropriate exam/evaluation, counseling and educating, documenting clinical information in the EHR, and communicating results.      Update 04/08/2024 Edwin [name] is a 79 y.o. male coming in with complaint of L knee pain. Patient states that he has been doing better. PT is somewhat helpful. Pain is worse when going up stairs.  Starting to affect daily activities on a more regular basis.       Past Medical History:  Diagnosis Date   BPH (benign prostatic hypertrophy)    Cancer (HCC)    Basal cell of face, Dr. Rolan Molt   Cataract    Diverticulosis    Bertrum syndrome    Hyperglycemia    Hyperlipidemia    NMR Lipoprofile 2005; LDL 146 (2007/1330), HDL 38, TG 169.  LDL goal = <100; Framingham Study LDL goal =<130   Hypertension    OSA on CPAP    Preproliferative diabetic retinopathy (HCC)    Sleep apnea    Tubular adenoma of colon 01/19/2008   Past Surgical History:  Procedure Laterality Date   catract removal Bilateral    COLONOSCOPY  05/07/2012   neg; [facility]   COLONOSCOPY W/ POLYPECTOMY  2004 & 2009   Tics; [facility]   KNEE ARTHROSCOPY     [name],  left knee   TONSILLECTOMY     Torn meniscus  Right    WISDOM TOOTH EXTRACTION     Social History   Socioeconomic History   Marital status: Married    Spouse name: Educational Psychologist   Number of children: 2   Years of education: 18   Highest education level: Not on file  Occupational History   Occupation: Gaffer // Retired    Associate Professor: [employer]   Tobacco Use   Smoking status: Never   Smokeless tobacco: Never  Vaping Use   Vaping status: Never Used  Substance and Sexual Activity   Alcohol use: Yes    Alcohol/week: 0.0 standard drinks of alcohol    Comment: socially, 3-5 beers/week   Drug use: No   Sexual activity: Yes  Other Topics Concern   Not on file  Social History Narrative   Fun: Travel, read, movies, concerts    Denies abuse and feels safe at home.    Social Drivers of Health   Tobacco Use: Low Risk (12/10/2023)   Patient History    Smoking Tobacco Use: Never    Smokeless Tobacco Use: Never    Passive Exposure: Not on file  Financial Resource Strain: Low Risk (11/04/2023)   Overall Financial Resource Strain (CARDIA)    Difficulty of Paying Living  Expenses: Not hard at all  Food Insecurity: No Food Insecurity (11/04/2023)   Epic    Worried About Programme Researcher, Broadcasting/film/video in the Last Year: Never true    Ran Out of Food in the Last Year: Never true  Transportation Needs: No Transportation Needs (11/04/2023)   Epic    Lack of Transportation (Medical): No    Lack of Transportation (Non-Medical): No  Physical Activity: Unknown (11/04/2023)   Exercise Vital Sign    Days of Exercise per Week: Not on file    Minutes of Exercise per Session: 40 min  Stress: No Stress Concern Present (11/04/2023)   [employer] of Occupational Health - Occupational Stress Questionnaire    Feeling of Stress: Not at all  Social Connections: Socially Integrated (11/04/2023)   Social Connection and Isolation Panel    Frequency of Communication with Friends and Family: More than  three times a week    Frequency of Social Gatherings with Friends and Family: More than three times a week    Attends Religious Services: More than 4 times per year    Active Member of Clubs or Organizations: Yes    Attends Banker Meetings: Never    Marital Status: Married  Depression (PHQ2-9): Low Risk (12/11/2023)   Depression (PHQ2-9)    PHQ-2 Score: 0  Alcohol Screen: Low Risk (11/04/2023)   Alcohol Screen    Last Alcohol Screening Score (AUDIT): 0  Housing: Unknown (11/04/2023)   Epic    Unable to Pay for Housing in the Last Year: No    Number of Times Moved in the Last Year: Not on file    Homeless in the Last Year: No  Utilities: Not At Risk (11/04/2023)   Epic    Threatened with loss of utilities: No  Health Literacy: Adequate Health Literacy (11/04/2023)   B1300 Health Literacy    Frequency of need for help with medical instructions: Never   Allergies[1] Family History  Problem Relation Age of Onset   Diverticulosis Mother    Stroke Mother        late 71s   Parkinsonism Mother    Diabetes Father    Hypertension Father    Heart disease Father 61       CABG   Hypertension Sister    Appendicitis Sister        ruptured   Cancer Brother        lymphoma   Appendicitis Brother        ruptured   Asthma Brother        childhood   Heart attack Maternal Grandmother 80       questionable   Heart attack Maternal Grandfather 44   Parkinsonism Maternal Grandfather    Stroke Paternal Grandmother 41   Colon cancer Neg Hx    Esophageal cancer Neg Hx    Rectal cancer Neg Hx    Stomach cancer Neg Hx    Colon polyps Neg Hx     Current Outpatient Medications (Endocrine & Metabolic):    empagliflozin  (JARDIANCE ) 25 MG TABS tablet, Take 1 tablet (25 mg total) by mouth daily.   Semaglutide  (RYBELSUS ) 3 MG TABS, Take 1 tablet (3 mg total) by mouth daily.   Semaglutide  (RYBELSUS ) 3 MG TABS, Take 1 tablet (3 mg total) by mouth daily.  Current Outpatient Medications  (Cardiovascular):    atorvastatin  (LIPITOR) 10 MG tablet, TAKE 1 TABLET EVERY OTHER DAY   metoprolol  succinate (TOPROL -XL) 50 MG 24 hr tablet, TAKE 1  TABLET ONCE DAILY. TAKE WITH OR IMMEDIATELY FOLLOWING A MEAL.   nitroGLYCERIN  (NITROSTAT ) 0.4 MG SL tablet, Place 1 tablet (0.4 mg total) under the tongue every 5 (five) minutes as needed for chest pain.  Current Outpatient Medications (Respiratory):    albuterol  (VENTOLIN  HFA) 108 (90 Base) MCG/ACT inhaler, Inhale 2 puffs into the lungs every 6 (six) hours as needed (wheezing or cough).   azelastine (ASTELIN) 0.1 % nasal spray, Place into the nose.  Current Outpatient Medications (Analgesics):    aspirin 81 MG tablet, Take 81 mg by mouth daily.   naproxen sodium (ALEVE) 220 MG tablet, Take 220 mg by mouth daily as needed.  Current Outpatient Medications (Hematological):    Cyanocobalamin  (VITAMIN B-12 PO), Take by mouth as directed.  Current Outpatient Medications (Other):    Cholecalciferol (VITAMIN D3) 1000 UNITS CAPS, Take 1 capsule by mouth daily.   fluconazole  (DIFLUCAN ) 150 MG tablet, Take 1 tablet (150 mg total) by mouth every 3 (three) days.   Multiple Vitamin (MULTIVITAMIN) tablet, Take 1 tablet by mouth daily.   Omega-3 Fatty Acids (FISH OIL) 300 MG CAPS, Take by mouth.   trimethoprim -polymyxin b  (POLYTRIM ) ophthalmic solution,    Turmeric (QC TUMERIC COMPLEX PO), Take by mouth daily.   Reviewed prior external information including notes and imaging from  primary care provider As well as notes that were available from care everywhere and other healthcare systems.  Past medical history, social, surgical and family history all reviewed in electronic medical record.  No pertanent information unless stated regarding to the chief complaint.   Review of Systems:  No headache, visual changes, nausea, vomiting, diarrhea, constipation, dizziness, abdominal pain, skin rash, fevers, chills, night sweats, weight loss, swollen lymph nodes,  body aches, joint swelling, chest pain, shortness of breath, mood changes. POSITIVE muscle aches  Objective  Blood pressure 118/76, pulse 64, height 5' 9 (1.753 m), weight 243 lb (110.2 kg), SpO2 98%.   General: No apparent distress alert and oriented x3 mood and affect normal, dressed appropriately.  HEENT: Pupils equal, extraocular movements intact  Respiratory: Patient's speak in full sentences and does not appear short of breath  Cardiovascular: No lower extremity edema, non tender, no erythema  Left knee exam does have some degenerative changes noted.  Trace effusion noted.  Crepitus with range of motion  After informed written and verbal consent, patient was seated on exam table. Left knee was prepped with alcohol swab and utilizing anterolateral approach, patient's left knee space was injected with 48 mg per 3 mL of Monovisc (sodium hyaluronate) in a prefilled syringe was injected easily into the knee through a 22-gauge needle..Patient tolerated the procedure well without immediate complications.    Impression and Recommendations:     The above documentation has been reviewed and is accurate and complete Batya Citron M Kathryn Linarez, DO       [1] No Known Allergies  "

## 2024-05-27 NOTE — Telephone Encounter (Signed)
 Monovisc benefits ran for left knee Case [phone]

## 2024-05-28 ENCOUNTER — Encounter: Payer: Self-pay | Admitting: Family Medicine

## 2024-05-28 ENCOUNTER — Ambulatory Visit: Admitting: Family Medicine

## 2024-05-28 VITALS — BP 118/76 | HR 64 | Ht 69.0 in | Wt 243.0 lb

## 2024-05-28 DIAGNOSIS — M1712 Unilateral primary osteoarthritis, left knee: Secondary | ICD-10-CM

## 2024-05-28 MED ORDER — HYALURONAN 88 MG/4ML IX SOSY
88.0000 mg | PREFILLED_SYRINGE | Freq: Once | INTRA_ARTICULAR | Status: AC
  Administered 2024-05-28: 88 mg via INTRA_ARTICULAR

## 2024-05-28 NOTE — Telephone Encounter (Signed)
 MONOVISC authorized left knee PA NOT REQUIRED  Deductible does not apply OOP MAX $3950 has met $50 Once the OOP has been met patient is covered at 100% Copay $10 Reference # [id number]

## 2024-05-28 NOTE — Assessment & Plan Note (Signed)
 Patient did have improvement and has responded extremely well to this in the past.  Hopeful that this will make a significant improvement again.  Discussed with patient about icing regimen and home exercises, continuing to stay active.  Discussed avoiding twisting motions when possible.  Patient still wants to avoid any surgical intervention if possible.  Follow-up again in 6 to 12 weeks.

## 2024-05-28 NOTE — Patient Instructions (Addendum)
 Monovisc injection today Enjoy Spring! See me again in 3 months

## 2024-06-15 ENCOUNTER — Ambulatory Visit: Admitting: Internal Medicine

## 2024-08-26 ENCOUNTER — Ambulatory Visit: Admitting: Family Medicine

## 2024-11-04 ENCOUNTER — Ambulatory Visit
